# Patient Record
Sex: Male | Born: 1975
Health system: Southern US, Community
[De-identification: ages and names within clinical notes are randomized; demographics above are authoritative.]

## PROBLEM LIST (undated history)

## (undated) DIAGNOSIS — J189 Pneumonia, unspecified organism: Secondary | ICD-10-CM

## (undated) DIAGNOSIS — G4730 Sleep apnea, unspecified: Secondary | ICD-10-CM

## (undated) DIAGNOSIS — S83249A Other tear of medial meniscus, current injury, unspecified knee, initial encounter: Secondary | ICD-10-CM

## (undated) DIAGNOSIS — K219 Gastro-esophageal reflux disease without esophagitis: Secondary | ICD-10-CM

## (undated) DIAGNOSIS — N289 Disorder of kidney and ureter, unspecified: Secondary | ICD-10-CM

## (undated) DIAGNOSIS — H8110 Benign paroxysmal vertigo, unspecified ear: Secondary | ICD-10-CM

## (undated) DIAGNOSIS — M1990 Unspecified osteoarthritis, unspecified site: Secondary | ICD-10-CM

## (undated) DIAGNOSIS — J45909 Unspecified asthma, uncomplicated: Secondary | ICD-10-CM

## (undated) DIAGNOSIS — E669 Obesity, unspecified: Secondary | ICD-10-CM

## (undated) DIAGNOSIS — E7800 Pure hypercholesterolemia, unspecified: Secondary | ICD-10-CM

## (undated) DIAGNOSIS — E119 Type 2 diabetes mellitus without complications: Secondary | ICD-10-CM

## (undated) HISTORY — PX: ADRENALECTOMY: SHX876

## (undated) HISTORY — DX: Unspecified asthma, uncomplicated: J45.909

## (undated) HISTORY — DX: Type 2 diabetes mellitus without complications: E11.9

## (undated) HISTORY — DX: Sleep apnea, unspecified: G47.30

## (undated) HISTORY — DX: Gastro-esophageal reflux disease without esophagitis: K21.9

## (undated) HISTORY — DX: Unspecified osteoarthritis, unspecified site: M19.90

---

## 1997-10-27 ENCOUNTER — Emergency Department (HOSPITAL_COMMUNITY): Admission: EM | Admit: 1997-10-27 | Discharge: 1997-10-28 | Payer: Self-pay | Admitting: Emergency Medicine

## 1997-10-28 ENCOUNTER — Encounter: Payer: Self-pay | Admitting: Emergency Medicine

## 1998-04-28 ENCOUNTER — Emergency Department (HOSPITAL_COMMUNITY): Admission: EM | Admit: 1998-04-28 | Discharge: 1998-04-29 | Payer: Self-pay | Admitting: Emergency Medicine

## 1998-08-09 ENCOUNTER — Emergency Department (HOSPITAL_COMMUNITY): Admission: EM | Admit: 1998-08-09 | Discharge: 1998-08-09 | Payer: Self-pay | Admitting: Emergency Medicine

## 1998-08-11 ENCOUNTER — Emergency Department (HOSPITAL_COMMUNITY): Admission: EM | Admit: 1998-08-11 | Discharge: 1998-08-11 | Payer: Self-pay | Admitting: Emergency Medicine

## 1998-11-22 ENCOUNTER — Emergency Department (HOSPITAL_COMMUNITY): Admission: EM | Admit: 1998-11-22 | Discharge: 1998-11-22 | Payer: Self-pay | Admitting: Emergency Medicine

## 1999-01-09 ENCOUNTER — Emergency Department (HOSPITAL_COMMUNITY): Admission: EM | Admit: 1999-01-09 | Discharge: 1999-01-09 | Payer: Self-pay | Admitting: Emergency Medicine

## 1999-02-20 ENCOUNTER — Emergency Department (HOSPITAL_COMMUNITY): Admission: EM | Admit: 1999-02-20 | Discharge: 1999-02-20 | Payer: Self-pay | Admitting: Emergency Medicine

## 1999-11-28 ENCOUNTER — Inpatient Hospital Stay (HOSPITAL_COMMUNITY): Admission: EM | Admit: 1999-11-28 | Discharge: 1999-12-01 | Payer: Self-pay | Admitting: Emergency Medicine

## 1999-11-28 ENCOUNTER — Encounter: Payer: Self-pay | Admitting: Emergency Medicine

## 2000-04-13 ENCOUNTER — Emergency Department (HOSPITAL_COMMUNITY): Admission: EM | Admit: 2000-04-13 | Discharge: 2000-04-13 | Payer: Self-pay | Admitting: Emergency Medicine

## 2000-05-04 ENCOUNTER — Encounter: Admission: RE | Admit: 2000-05-04 | Discharge: 2000-05-04 | Payer: Self-pay | Admitting: Family Medicine

## 2000-05-09 ENCOUNTER — Ambulatory Visit (HOSPITAL_BASED_OUTPATIENT_CLINIC_OR_DEPARTMENT_OTHER): Admission: RE | Admit: 2000-05-09 | Discharge: 2000-05-09 | Payer: Self-pay | Admitting: *Deleted

## 2000-05-30 ENCOUNTER — Emergency Department (HOSPITAL_COMMUNITY): Admission: EM | Admit: 2000-05-30 | Discharge: 2000-05-30 | Payer: Self-pay | Admitting: Emergency Medicine

## 2000-06-17 ENCOUNTER — Encounter: Payer: Self-pay | Admitting: Emergency Medicine

## 2000-06-17 ENCOUNTER — Emergency Department (HOSPITAL_COMMUNITY): Admission: EM | Admit: 2000-06-17 | Discharge: 2000-06-17 | Payer: Self-pay | Admitting: Emergency Medicine

## 2000-08-27 ENCOUNTER — Encounter: Payer: Self-pay | Admitting: Emergency Medicine

## 2000-08-27 ENCOUNTER — Emergency Department (HOSPITAL_COMMUNITY): Admission: EM | Admit: 2000-08-27 | Discharge: 2000-08-27 | Payer: Self-pay | Admitting: Emergency Medicine

## 2002-06-13 ENCOUNTER — Emergency Department (HOSPITAL_COMMUNITY): Admission: EM | Admit: 2002-06-13 | Discharge: 2002-06-13 | Payer: Self-pay | Admitting: Emergency Medicine

## 2002-08-30 ENCOUNTER — Encounter: Admission: RE | Admit: 2002-08-30 | Discharge: 2002-08-30 | Payer: Self-pay | Admitting: Family Medicine

## 2002-08-30 ENCOUNTER — Encounter: Payer: Self-pay | Admitting: Sports Medicine

## 2002-08-30 ENCOUNTER — Encounter: Admission: RE | Admit: 2002-08-30 | Discharge: 2002-08-30 | Payer: Self-pay | Admitting: Sports Medicine

## 2002-09-05 ENCOUNTER — Encounter: Admission: RE | Admit: 2002-09-05 | Discharge: 2002-09-05 | Payer: Self-pay | Admitting: Family Medicine

## 2002-09-20 ENCOUNTER — Encounter: Admission: RE | Admit: 2002-09-20 | Discharge: 2002-09-20 | Payer: Self-pay | Admitting: Family Medicine

## 2003-01-07 ENCOUNTER — Emergency Department (HOSPITAL_COMMUNITY): Admission: AD | Admit: 2003-01-07 | Discharge: 2003-01-07 | Payer: Self-pay | Admitting: Family Medicine

## 2003-01-14 ENCOUNTER — Emergency Department (HOSPITAL_COMMUNITY): Admission: EM | Admit: 2003-01-14 | Discharge: 2003-01-14 | Payer: Self-pay | Admitting: Emergency Medicine

## 2003-03-18 ENCOUNTER — Emergency Department (HOSPITAL_COMMUNITY): Admission: EM | Admit: 2003-03-18 | Discharge: 2003-03-18 | Payer: Self-pay

## 2003-06-04 ENCOUNTER — Emergency Department (HOSPITAL_COMMUNITY): Admission: EM | Admit: 2003-06-04 | Discharge: 2003-06-04 | Payer: Self-pay | Admitting: Family Medicine

## 2003-07-13 ENCOUNTER — Emergency Department (HOSPITAL_COMMUNITY): Admission: EM | Admit: 2003-07-13 | Discharge: 2003-07-13 | Payer: Self-pay | Admitting: Family Medicine

## 2003-08-13 ENCOUNTER — Emergency Department (HOSPITAL_COMMUNITY): Admission: EM | Admit: 2003-08-13 | Discharge: 2003-08-13 | Payer: Self-pay | Admitting: Family Medicine

## 2003-10-07 ENCOUNTER — Emergency Department (HOSPITAL_COMMUNITY): Admission: EM | Admit: 2003-10-07 | Discharge: 2003-10-07 | Payer: Self-pay | Admitting: Family Medicine

## 2004-01-07 ENCOUNTER — Emergency Department (HOSPITAL_COMMUNITY): Admission: EM | Admit: 2004-01-07 | Discharge: 2004-01-07 | Payer: Self-pay | Admitting: Family Medicine

## 2004-05-17 ENCOUNTER — Emergency Department (HOSPITAL_COMMUNITY): Admission: EM | Admit: 2004-05-17 | Discharge: 2004-05-17 | Payer: Self-pay | Admitting: Emergency Medicine

## 2004-11-27 ENCOUNTER — Emergency Department (HOSPITAL_COMMUNITY): Admission: EM | Admit: 2004-11-27 | Discharge: 2004-11-27 | Payer: Self-pay | Admitting: Emergency Medicine

## 2005-04-02 ENCOUNTER — Emergency Department (HOSPITAL_COMMUNITY): Admission: EM | Admit: 2005-04-02 | Discharge: 2005-04-02 | Payer: Self-pay | Admitting: Family Medicine

## 2005-05-05 ENCOUNTER — Emergency Department (HOSPITAL_COMMUNITY): Admission: EM | Admit: 2005-05-05 | Discharge: 2005-05-05 | Payer: Self-pay | Admitting: Family Medicine

## 2006-03-16 DIAGNOSIS — M25579 Pain in unspecified ankle and joints of unspecified foot: Secondary | ICD-10-CM

## 2006-03-16 DIAGNOSIS — E669 Obesity, unspecified: Secondary | ICD-10-CM | POA: Insufficient documentation

## 2006-09-05 ENCOUNTER — Emergency Department (HOSPITAL_COMMUNITY): Admission: EM | Admit: 2006-09-05 | Discharge: 2006-09-05 | Payer: Self-pay | Admitting: Emergency Medicine

## 2006-09-14 ENCOUNTER — Emergency Department (HOSPITAL_COMMUNITY): Admission: EM | Admit: 2006-09-14 | Discharge: 2006-09-14 | Payer: Self-pay | Admitting: Emergency Medicine

## 2007-11-05 ENCOUNTER — Emergency Department (HOSPITAL_COMMUNITY): Admission: EM | Admit: 2007-11-05 | Discharge: 2007-11-05 | Payer: Self-pay | Admitting: Emergency Medicine

## 2008-06-25 ENCOUNTER — Emergency Department (HOSPITAL_BASED_OUTPATIENT_CLINIC_OR_DEPARTMENT_OTHER): Admission: EM | Admit: 2008-06-25 | Discharge: 2008-06-25 | Payer: Self-pay | Admitting: Emergency Medicine

## 2008-06-25 ENCOUNTER — Ambulatory Visit: Payer: Self-pay | Admitting: Diagnostic Radiology

## 2009-01-29 ENCOUNTER — Emergency Department (HOSPITAL_COMMUNITY): Admission: EM | Admit: 2009-01-29 | Discharge: 2009-01-29 | Payer: Self-pay | Admitting: Emergency Medicine

## 2009-05-04 ENCOUNTER — Emergency Department (HOSPITAL_COMMUNITY): Admission: EM | Admit: 2009-05-04 | Discharge: 2009-05-04 | Payer: Self-pay | Admitting: Emergency Medicine

## 2009-07-31 ENCOUNTER — Telehealth: Payer: Self-pay | Admitting: Pulmonary Disease

## 2009-07-31 ENCOUNTER — Ambulatory Visit: Payer: Self-pay | Admitting: Pulmonary Disease

## 2009-07-31 DIAGNOSIS — G4730 Sleep apnea, unspecified: Secondary | ICD-10-CM

## 2009-07-31 DIAGNOSIS — G4733 Obstructive sleep apnea (adult) (pediatric): Secondary | ICD-10-CM | POA: Insufficient documentation

## 2009-08-04 ENCOUNTER — Encounter: Payer: Self-pay | Admitting: Pulmonary Disease

## 2009-08-05 ENCOUNTER — Ambulatory Visit: Payer: Self-pay | Admitting: Pulmonary Disease

## 2009-08-05 ENCOUNTER — Telehealth: Payer: Self-pay | Admitting: Pulmonary Disease

## 2009-09-01 ENCOUNTER — Telehealth: Payer: Self-pay | Admitting: Pulmonary Disease

## 2009-10-08 ENCOUNTER — Telehealth: Payer: Self-pay | Admitting: Pulmonary Disease

## 2009-10-13 ENCOUNTER — Emergency Department (HOSPITAL_COMMUNITY): Admission: EM | Admit: 2009-10-13 | Discharge: 2009-10-13 | Payer: Self-pay | Admitting: Family Medicine

## 2009-12-17 HISTORY — PX: CORNEAL TRANSPLANT: SHX108

## 2010-02-18 NOTE — Assessment & Plan Note (Signed)
Summary: sleep eval/ mbw    Visit Type:  Initial Consult Copy to:  self Primary Provider/Referring Provider:  n/a  CC:  Pt here for sleep consult.  History of Present Illness: 35/M referred for evaluation of obstructive sleep apnea after doT physical. Epworth Sleepiness Score is 1/24 - he denies excessive daytime somnolence . 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 Bedtime is around MN, minimal sleep latency, denies nocturia, sleeps on his side, no pillows, wakes up around 7A feeling refreshed, denies dryness of mouth or headaches. He has gained 15 lbs inthe lsat 2 yrs. Wife has noted loud snoring but not witnessed apneas. There is no history suggestive of cataplexy, sleep paralysis or parasomnias   Preventive Screening-Counseling & Management  Alcohol-Tobacco     Smoking Status: never   History of Present Illness: None. Employer said I need to have Dr. Janene Madeira I do not have sleep apnea  What time do you typically go to bed?(between what hours): 12:00 am  How long does it take you to fall asleep?  How many times during the night do you wake up? none  What time do you get out of bed to start your day?  6:00-7:00am  Do you drive or operate heavy machinery in your occupation? yes  How much has your weight changed (up or down) over the past two years? (in pounds): -15 lbs  Have you ever had a sleep study before?  If yes,when and where: no  Do you currently use CPAP ? If so , at what pressure? no  Do you wear oxygen at any time? If yes, how many liters per minute? no Current Medications (verified): 1)  Multivitamins   Tabs (Multiple Vitamin) .... Take 1 Tablet By Mouth Once A Day  Allergies (verified): No Known Drug Allergies  Past History:  Past Surgical History: none  Family History: Family History Prostate Cancer-father, paternal uncle Unknown Cancer-paternal aunts and uncles  Social History: No ETOH, no Smoking, no  illicit drugs. Marital Status: Married Children: yes Occupation: Geographical information systems officer, [employer] Patient never smoked.  Smoking Status:  never  Review of Systems  The patient denies shortness of breath with activity, shortness of breath at rest, productive cough, non-productive cough, coughing up blood, chest pain, irregular heartbeats, acid heartburn, indigestion, loss of appetite, weight change, abdominal pain, difficulty swallowing, sore throat, tooth/dental problems, headaches, nasal congestion/difficulty breathing through nose, sneezing, itching, ear ache, anxiety, depression, hand/feet swelling, joint stiffness or pain, rash, change in color of mucus, and fever.    Vital Signs:  Patient profile:   35 year old male Height:      68 inches Weight:      281.12 pounds BMI:     42.90 O2 Sat:      95 % on Room air Temp:     98.3 degrees F oral Pulse rate:   95 / minute BP sitting:   110 / 60  (left arm) Cuff size:   large  Vitals Entered By: Zackery Barefoot CMA (July 31, 2009 10:14 AM)  O2 Flow:  Room air CC: Pt here for sleep consult Comments Medications reviewed with patient Verified contact number and pharmacy with patient Zackery Barefoot CMA  July 31, 2009 10:15 AM    Physical Exam  Additional Exam:  Gen. Pleasant, well-nourished, in no distress, normal affect ENT - no lesions, no post nasal drip, class 3 airway Neck: No JVD, no  thyromegaly, no carotid bruits Lungs: no use of accessory muscles, no dullness to percussion, clear without rales or rhonchi  Cardiovascular: Rhythm regular, heart sounds  normal, no murmurs or gallops, no peripheral edema Abdomen: soft and non-tender, no hepatosplenomegaly, BS normal. Musculoskeletal: No deformities, no cyanosis or clubbing Neuro:  alert, non focal     Impression & Recommendations:  Problem # 1:  OBSTRUCTIVE SLEEP APNEA (ICD-780.57) He does not seem to have excessive daytime somnolence or witnessed apneas. Due to  snoring & narrow pharyngeal exam, a home study will be scheduled. If mild events, he can be cleared to drive Weight loss is recommended. Iwould advocate to extend his card x 90 days if evaluation takes longer - he tells me he has only 4 days left Orders: Sleep Disorder Referral (Sleep Disorder) Consultation Level III (16109)  Medications Added to Medication List This Visit: 1)  Multivitamins Tabs (Multiple vitamin) .... Take 1 tablet by mouth once a day  Patient Instructions: 1)  Please schedule a follow-up appointment in 1 month. 2)  Home sleep study 3)  Get me the name of your doctor fro doT physical & we will send him a note so that your license can be extended 4)  Copy sent to:Dr Korang [phone], [facility]

## 2010-02-18 NOTE — Progress Notes (Signed)
Summary: FYI  Phone Note Call from Patient Call back at [facility] Phone [phone]   Caller: Patient Call For: Amandeep Hogston Summary of Call: Pt states that RA needed name of the doctor that did his physical so he could send note to have his license extended, the info is as follows: [facility], [name] and the fax is [phone].  Initial call taken by: Darletta Moll,  July 31, 2009 2:27 PM  Follow-up for Phone Call        will forward this message to RA as an FYI.  Aundra Millet Reynolds LPN  July 31, 2009 2:31 PM   pl fax note to this address Follow-up by: [name]. Vassie Loll MD,  July 31, 2009 5:12 PM

## 2010-02-18 NOTE — Progress Notes (Signed)
Summary: nos appt  Phone Note Call from Patient   Caller: [email or web address]  Call For: Amaar Oshita Summary of Call: LMTCB x2 to rsc nos from 9/21. Initial call taken by: Darletta Moll,  October 08, 2009 4:22 PM

## 2010-02-18 NOTE — Progress Notes (Signed)
Summary: FYI  Phone Note From Other Clinic Call back at [phone]   Caller: [facility] Call For: Derrick Scott Summary of Call: Pt was seen in their office for a Drug Screen, not a CPX. Initial call taken by: Derrick Scott,  August 05, 2009 1:09 PM  Follow-up for Phone Call        This is an FYI for RA; refer to 07-31-09 phone note for more information.[name] CMA  August 05, 2009 1:29 PM   Additional Follow-up for Phone Call Additional follow up Details #1::        Let him know that sleep study showed mild obstructive sleep apnea & drop in oxygen level, does not need tretament since he is asymptomatic. Weight loss would be curative, weight gain would make this worse. Get the name of his doctor so I can send them a letter - see above (not the drug testing place) Additional Follow-up by: [name]. Derrick Loll MD,  August 05, 2009 3:42 PM    Additional Follow-up for Phone Call Additional follow up Details #2::    called and spoke with pt--he is aware of sleep study results.  he has appt with RA on 8-15,   and his [facility] is [name] at [facility] per pt. [name] CMA  August 05, 2009 3:54 PM   Letter done  - pl send  Follow-up by: [name]. Derrick Loll MD,  August 05, 2009 5:43 PM   Appended Document: Lorain Childes Called pt to get [name] contact information. Pt states he will return call with information. jwr  Appended Document: FYI Pt NOS appointment 08/31/2009 will sign off this append pending pt return call. jwr

## 2010-02-18 NOTE — Progress Notes (Signed)
Summary: nos appt  Phone Note Call from Patient   Caller: [email or web address]  Call For: alva Summary of Call: Rsc nos from 8/15 to 9/21 @ 9a. Initial call taken by: Darletta Moll,  September 01, 2009 10:08 AM

## 2010-02-18 NOTE — Assessment & Plan Note (Signed)
Summary: apena link only/cb   Allergies: No Known Drug Allergies   Other Orders: Sleep Std Airflow/Heartrate and O2 SAT unattended (09811)

## 2010-02-18 NOTE — Letter (Signed)
Summary: Consultant Letter  All     ,     Phone:   Fax:     08/05/2009  Dear Dr. :  I am writing to report my evaluation of Derrick Scott, a 35 Years Old Male on whom I consulted on 08/05/2009 for a sleep evaluation. He denies excessive daytime somnolence. Portable sleep study showed mild obstructive sleep apnea with AHI of 9/hr & a desaturation index of 12/hr. In the absence of symptoms, weight loss is advised.   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 We have also discussed that sleepiness can vary on a daily basis due to various factors & he is responsible for his ability to drive at any given time. Should his weight increase or his symptoms worsen, I woul dbe happt to assess him again.  I have attached a copy of my clinic note to the end of this letter which details the review of systems and physical examination for your review.   Thank you for the opportunity to participate in the care of Derrick Scott.  Yours truly,     [name]. Vassie Loll MD

## 2010-04-01 LAB — POCT RAPID STREP A (OFFICE): Streptococcus, Group A Screen (Direct): NEGATIVE

## 2010-04-06 LAB — URINE MICROSCOPIC-ADD ON

## 2010-04-06 LAB — URINALYSIS, ROUTINE W REFLEX MICROSCOPIC
Ketones, ur: NEGATIVE mg/dL
Leukocytes, UA: NEGATIVE
Nitrite: NEGATIVE
Protein, ur: NEGATIVE mg/dL
Urobilinogen, UA: 0.2 mg/dL (ref 0.0–1.0)

## 2010-06-04 NOTE — Discharge Summary (Signed)
[facility]  Patient:    Derrick Scott, Derrick Scott                   MRN: [id number] Adm. Date:  [id number] Disc. Date: [id number] Attending:  Kae Heller                           Discharge Summary  DISCHARGE DIAGNOSES: 1. Left upper lobe pneumonia. 2. Hypoxia secondary to pneumonia. 3. Hypokalemia.  COURSE OF HOSPITALIZATION:  Derrick Scott was admitted with the diagnosis of community acquired pneumonia.  He was placed on azithromycin as well as Rocephin at the time of admission.  He also was having some fairly significant chest pain related to his pneumonia.  He was given ibuprofen initially 400 mg every six hours as needed but that was changed to Toradol which gave him very significant improvement in his pain.  The patient improved rapidly during hospitalization with regard to the pneumonia.  Within 24 hours of admission, he was breathing significantly better, his pulse oximetry improved to 100% with oxygen supplementation.  We continued him on oxygen because of tachycardia for the next 24 hours and he continued to improve.  He was also given potassium supplementation with his IV fluids to correct his hypokalemia.  By November 13, his blood cultures were negative, we continued him on Rocephin and azithromycin.  His white count was significantly improving.  His tachycardia had markedly improved and his O2 saturations were 98% on room air.  By November 14, he was markedly improved.  He had really no significant symptoms, no dyspnea.  His lungs at that time were clear and there were no abnormalities of his left basilar tubular breath sounds.  DISCHARGE MEDICATIONS: 1. Zithromax 250 mg to complete a 7 day course. 2. Cedax 400 mg 1 p.o. q day for 7 days. 3. Vicodin 1-2 pills q.4-6h. as needed for pain.  DISCHARGE CONDITION:  The patient was discharged in good condition.  DISCHARGE FOLLOW-UP:  He is to followup with Dr. Merril Abbe one week  after discharge.  Patient was to call to schedule that appointment. DD:  01/26/00 TD:  01/27/00 Job: [id number]

## 2010-09-06 ENCOUNTER — Emergency Department (HOSPITAL_COMMUNITY): Payer: BC Managed Care – HMO

## 2010-09-06 ENCOUNTER — Emergency Department (HOSPITAL_COMMUNITY)
Admission: EM | Admit: 2010-09-06 | Discharge: 2010-09-06 | Disposition: A | Discharge status: Home / Self Care | Payer: BC Managed Care – HMO | Attending: Emergency Medicine | Admitting: Emergency Medicine

## 2010-09-06 DIAGNOSIS — R079 Chest pain, unspecified: Secondary | ICD-10-CM | POA: Insufficient documentation

## 2010-09-06 DIAGNOSIS — R0602 Shortness of breath: Secondary | ICD-10-CM | POA: Insufficient documentation

## 2010-09-06 LAB — POCT I-STAT, CHEM 8
Chloride: 105 mEq/L (ref 96–112)
Glucose, Bld: 104 mg/dL — ABNORMAL HIGH (ref 70–99)
HCT: 47 % (ref 39.0–52.0)
Potassium: 4 mEq/L (ref 3.5–5.1)

## 2010-09-06 LAB — CBC
Hemoglobin: 14.8 g/dL (ref 13.0–17.0)
MCH: 29.2 pg (ref 26.0–34.0)
MCHC: 34.3 g/dL (ref 30.0–36.0)
MCV: 85.2 fL (ref 78.0–100.0)
WBC: 5.8 10*3/uL (ref 4.0–10.5)

## 2010-09-06 LAB — BASIC METABOLIC PANEL
BUN: 6 mg/dL (ref 6–23)
Calcium: 9.4 mg/dL (ref 8.4–10.5)
Creatinine, Ser: 0.97 mg/dL (ref 0.50–1.35)
GFR calc non Af Amer: >60 mL/min (ref >60)
Glucose, Bld: 103 mg/dL — ABNORMAL HIGH (ref 70–99)
Potassium: 3.9 mEq/L (ref 3.5–5.1)

## 2010-10-03 ENCOUNTER — Emergency Department (HOSPITAL_COMMUNITY): Payer: BC Managed Care – HMO

## 2010-10-03 ENCOUNTER — Emergency Department (HOSPITAL_COMMUNITY)
Admission: EM | Admit: 2010-10-03 | Discharge: 2010-10-03 | Disposition: A | Discharge status: Home / Self Care | Payer: BC Managed Care – HMO | Attending: Emergency Medicine | Admitting: Emergency Medicine

## 2010-10-03 DIAGNOSIS — J40 Bronchitis, not specified as acute or chronic: Secondary | ICD-10-CM | POA: Insufficient documentation

## 2010-10-03 DIAGNOSIS — R0602 Shortness of breath: Secondary | ICD-10-CM | POA: Insufficient documentation

## 2010-10-03 DIAGNOSIS — R05 Cough: Secondary | ICD-10-CM | POA: Insufficient documentation

## 2010-10-03 DIAGNOSIS — R059 Cough, unspecified: Secondary | ICD-10-CM | POA: Insufficient documentation

## 2011-02-09 ENCOUNTER — Ambulatory Visit: Payer: BC Managed Care – HMO | Admitting: Family Medicine

## 2011-03-02 ENCOUNTER — Ambulatory Visit: Payer: BC Managed Care – HMO | Admitting: Family Medicine

## 2011-04-06 ENCOUNTER — Ambulatory Visit: Payer: BC Managed Care – HMO | Admitting: Family Medicine

## 2011-06-24 ENCOUNTER — Emergency Department (INDEPENDENT_AMBULATORY_CARE_PROVIDER_SITE_OTHER)
Admission: EM | Admit: 2011-06-24 | Discharge: 2011-06-24 | Disposition: A | Discharge status: Home / Self Care | Payer: BC Managed Care – HMO | Source: Home / Self Care

## 2011-06-24 ENCOUNTER — Encounter (HOSPITAL_COMMUNITY): Payer: Self-pay | Admitting: Emergency Medicine

## 2011-06-24 ENCOUNTER — Emergency Department (INDEPENDENT_AMBULATORY_CARE_PROVIDER_SITE_OTHER): Payer: BC Managed Care – HMO

## 2011-06-24 DIAGNOSIS — IMO0002 Reserved for concepts with insufficient information to code with codable children: Secondary | ICD-10-CM

## 2011-06-24 MED ORDER — HYDROCODONE-ACETAMINOPHEN 5-500 MG PO TABS: 1.0000 | ORAL_TABLET | Freq: Four times a day (QID) | ORAL | Status: AC | PRN

## 2011-06-24 NOTE — Discharge Instructions (Signed)
Groin Strain (Adductor Strain)  A groin pull/strain refers to strained muscles on the upper inner part of the thigh. This usually occurs in muscles during exercise or participation in sports events. It is more likely to occur when your muscles are not warmed up well enough or if you are not properly conditioned. A pulled muscle, or muscle strain, occurs when a muscle is over stretched and some muscle fibers are torn. This is especially common in athletes where a sudden violent force placed on a muscle pushes it past its ability to respond. Usually, recovery from a pulled muscle takes 1 to 2 weeks, but complete healing will take 5 to 6 weeks.   HOME CARE INSTRUCTIONS    Apply ice to the sore muscle for 15 to 20 minutes every 2-3 hours for the first 2 days. Put ice in a plastic bag and place a towel between the bag of ice and your skin.   Do not strain the pulled muscle for as long as it is still painful.   Only take over-the-counter or prescription medicines for pain, discomfort, or fever as directed by your caregiver. Do not use aspirin as this may increase bleeding (bruising) at injury site.   Warming up before exercise and developing proper conditioning helps prevent muscle strains.  SEEK IMMEDIATE MEDICAL CARE IF:    There is increased pain or swelling in the affected area.   You are not improving or are getting worse.  Document Released: 09/01/2003 Document Revised: 12/23/2010 Document Reviewed: 08/26/2008  ExitCare Patient Information 2012 [employer].

## 2011-06-24 NOTE — ED Provider Notes (Signed)
History     CSN: [id number]  Arrival date & time 06/24/11  1510   First MD Initiated Contact with Patient 06/24/11 1517      No chief complaint on file.   (Consider location/radiation/quality/duration/timing/severity/associated sxs/prior treatment) HPI  Patient is 36 year old male who presents to urgent care with main concern of sudden onset left hip pain, he first noticed the episode about 2-3 days prior to coming to urgent care and this has been getting worse. Patient denies similar episodes in the past. Patient also denies any specific alleviating factors but does report that movement of any type of activity makes the pain in the left hip worse. Patient denies fevers and chills, he denies use of any over-the-counter medication. She denies other systemic symptoms, no recent trauma, no specific neurologic focal weakness. Patient denies numbness or tingling in that area, no muscle spasms.  No past medical history on file.  No past surgical history on file.  No family history on file.  History  Substance Use Topics  . Smoking status: Not on file  . Smokeless tobacco: Not on file  . Alcohol Use: Not on file     Review of Systems  Constitutional: Denies fever, chills, diaphoresis, appetite change and fatigue.  HEENT: Denies photophobia, eye pain, redness, hearing loss, ear pain, congestion, sore throat, rhinorrhea, sneezing, mouth sores, trouble swallowing, neck pain, neck stiffness and tinnitus.   Respiratory: Denies SOB, DOE, cough, chest tightness,  and wheezing.   Cardiovascular: Denies chest pain, palpitations and leg swelling.  Gastrointestinal: Denies nausea, vomiting, abdominal pain, diarrhea, constipation, blood in stool and abdominal distention.  Genitourinary: Denies dysuria, urgency, frequency, hematuria, flank pain and difficulty urinating.  Musculoskeletal: Denies myalgias, back pain, joint swelling, arthralgias and gait problem.  Skin: Denies pallor, rash and wound.   Neurological: Denies dizziness, seizures, syncope, light-headedness, numbness and headaches.  Hematological: Denies adenopathy. Easy bruising, personal or family bleeding history  Psychiatric/Behavioral: Denies suicidal ideation, mood changes, confusion, nervousness, sleep disturbance and agitation    Allergies  Review of patient's allergies indicates not on file.  Home Medications  No current outpatient prescriptions on file.  There were no vitals taken for this visit.  Physical Exam  Constitutional: Vital signs reviewed.  Patient is a well-developed and well-nourished in no acute distress and cooperative with exam. Alert and oriented x3.  Cardiovascular: RRR, S1 normal, S2 normal, no MRG, pulses symmetric and intact bilaterally Pulmonary/Chest: CTAB, no wheezes, rales, or rhonchi Abdominal: Soft. Non-tender, non-distended, bowel sounds are normal, no masses, organomegaly, or guarding present.  GU: no CVA tenderness Musculoskeletal: No joint deformities, erythema, or stiffness, ROM full and no nontender Neurological: A&O x3, Strenght is normal and symmetric bilaterally, cranial nerve II-XII are grossly intact, no focal motor deficit, sensory intact to light touch bilaterally.  patient reports difficulty performing physical exam due to pain in the left hip, no tenderness to palpation noted.  Skin: Warm, dry and intact. No rash, cyanosis, or clubbing.  Psychiatric: Normal mood and affect. speech and behavior is normal. Judgment and thought content normal. Cognition and memory are normal.    ED Course  Procedures (including critical care time)  XRAY of the left hip - no acute bony abnormalities   Left hip pain - Unclear etiology but most likely musculoskeletal and secondary to muscle strain, groin sprain - X-ray not indicated off any acute bony abnormality - We'll provide analgesia for adequate pain control - I have recommended weightbearing as tolerated, applying ice to the  area  as needed  MDM  I have provided pain medication for adequate control, also recommended weightbearing only as tolerated. I have told patient to apply ice to the affected area if needed.       Dorothea Ogle, MD 06/24/11 1721

## 2011-06-24 NOTE — ED Notes (Signed)
PT HERE WITH C/O LEFT HIP PAIN WITH WORSENING X 2 WEEKS.DIFF WITH PRESSURE.TRIED STRETCHING AND ADVIL FOR RELIEF

## 2011-09-25 ENCOUNTER — Emergency Department (INDEPENDENT_AMBULATORY_CARE_PROVIDER_SITE_OTHER)
Admission: EM | Admit: 2011-09-25 | Discharge: 2011-09-25 | Disposition: A | Discharge status: Home / Self Care | Payer: BC Managed Care – HMO | Source: Home / Self Care

## 2011-09-25 ENCOUNTER — Encounter (HOSPITAL_COMMUNITY): Payer: Self-pay | Admitting: Emergency Medicine

## 2011-09-25 DIAGNOSIS — G44209 Tension-type headache, unspecified, not intractable: Secondary | ICD-10-CM

## 2011-09-25 DIAGNOSIS — F430 Acute stress reaction: Secondary | ICD-10-CM

## 2011-09-25 DIAGNOSIS — R4589 Other symptoms and signs involving emotional state: Secondary | ICD-10-CM

## 2011-09-25 LAB — POCT I-STAT, CHEM 8
BUN: 11 mg/dL (ref 6–23)
Potassium: 3.7 mEq/L (ref 3.5–5.1)
Sodium: 142 mEq/L (ref 135–145)

## 2011-09-25 MED ORDER — LORAZEPAM 0.5 MG PO TABS: 0.5000 mg | ORAL_TABLET | Freq: Three times a day (TID) | ORAL | Status: AC

## 2011-09-25 NOTE — ED Notes (Signed)
Pt states that he woke up this a.m with a very bad headache pain has subsided a little through out the day but still having pain along with stiffness of his neck.  Pt states that over the past couple of days at work his employees noticed that he just didn't look right to them but pt says he felt fine no symptoms of illness.

## 2011-09-25 NOTE — ED Notes (Signed)
Patient and provider made aware results are back

## 2011-09-25 NOTE — ED Provider Notes (Addendum)
History     CSN: [id number]  Arrival date & time 09/25/11  1407   None     Chief Complaint  Patient presents with  . Fatigue    weakness and feeling jittery    (Consider location/radiation/quality/duration/timing/severity/associated sxs/prior treatment) HPI Comments: Awoke this AM feeling weak and jittery, hands shaking and feeling nervous. Past 3 days with post neck and occipital headache without associated neurologic sx's. Bending head forward pulls muscles that hurt. Denies , chest pain, dyspnea, diaphoresis, bleeding, or GI/GU sx's.   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 Job stressful and not getting enough sleep.    History reviewed. No pertinent past medical history.  Past Surgical History  Procedure Date  . Corneal transplant     History reviewed. No pertinent family history.  History  Substance Use Topics  . Smoking status: Never Smoker   . Smokeless tobacco: Not on file  . Alcohol Use: No      Review of Systems  Constitutional: Positive for activity change and fatigue. Negative for fever, chills, diaphoresis and appetite change.       As noted in HPI  HENT: Positive for neck pain. Negative for ear pain, nosebleeds, congestion, sore throat, facial swelling, rhinorrhea, neck stiffness and sinus pressure.   Eyes: Negative.   Respiratory: Negative.   Cardiovascular: Negative.   Gastrointestinal: Negative.   Genitourinary: Negative.   Musculoskeletal: Positive for myalgias.  Skin: Negative.   Neurological: Positive for tremors, weakness and headaches. Negative for dizziness, light-headedness and numbness.  Psychiatric/Behavioral: Positive for disturbed wake/sleep cycle. The patient is nervous/anxious.     Allergies  Review of patient's allergies indicates no known allergies.  Home Medications   Current Outpatient Rx  Name Route Sig Dispense Refill  . LORAZEPAM 0.5 MG PO TABS Oral Take 1 tablet  (0.5 mg total) by mouth every 8 (eight) hours. 20 tablet 0    BP 115/65  Pulse 86  Temp 98.8 F (37.1 C) (Oral)  Resp 19  SpO2 100%  Physical Exam  Constitutional: He is oriented to person, place, and time. He appears well-developed and well-nourished. No distress.  HENT:  Head: Normocephalic and atraumatic.  Mouth/Throat: Oropharynx is clear and moist.  Eyes: EOM are normal. Pupils are equal, round, and reactive to light.       L eye suture visible from corneal transplant  Neck: Normal range of motion. Neck supple.  Cardiovascular: Normal rate and normal heart sounds.   No murmur heard. Pulmonary/Chest: Breath sounds normal. No respiratory distress. He has no wheezes.  Abdominal: Soft. He exhibits no distension. There is no tenderness. There is no rebound.  Musculoskeletal:       Tenderness in occipital musculature as well as scalene muscle bilaterally. No bony tenderness. Full ROM of C-Spine  Lymphadenopathy:    He has no cervical adenopathy.  Neurological: He is alert and oriented to person, place, and time. No cranial nerve deficit.  Skin: Skin is warm. He is not diaphoretic.  Psychiatric: Judgment and thought content normal.    ED Course  Procedures (including critical care time)   Labs Reviewed  POCT I-STAT, CHEM 8  LAB REPORT - SCANNED   No results found.   1. Anxiety as acute reaction to exceptional stress   2. Tension headache       MDM  Stress reaction Ativan .5mg  q 8h for 3-4 days. Recommend out of work for 1-2 days and  get plenty of sleep. Motrin for headache prn.  Results for orders placed during the hospital encounter of 09/25/11  POCT I-STAT, CHEM 8      Component Value Range   Sodium 142  135 - 145 mEq/L   Potassium 3.7  3.5 - 5.1 mEq/L   Chloride 104  96 - 112 mEq/L   BUN 11  6 - 23 mg/dL   Creatinine, Ser 7.82  0.50 - 1.35 mg/dL   Glucose, Bld 98  70 - 99 mg/dL   Calcium, Ion 9.56  2.13 - 1.23 mmol/L   TCO2 26  0 - 100 mmol/L    Hemoglobin 16.0  13.0 - 17.0 g/dL   HCT 08.6  57.8 - 46.9 %            Hayden Rasmussen, NP 09/25/11 1747  Hayden Rasmussen, NP 09/27/11 0759  Hayden Rasmussen, NP 09/27/11 0802  Hayden Rasmussen, NP 09/27/11 6295

## 2011-09-26 NOTE — ED Provider Notes (Signed)
Medical screening examination/treatment/procedure(s) were performed by non-physician practitioner and as supervising physician I was immediately available for consultation/collaboration.  Luiz Blare MD   Luiz Blare, MD 09/26/11 [phone]

## 2011-09-28 NOTE — ED Provider Notes (Signed)
Medical screening examination/treatment/procedure(s) were performed by non-physician practitioner and as supervising physician I was immediately available for consultation/collaboration.  Luiz Blare MD   Luiz Blare, MD 09/28/11 [phone]

## 2012-01-18 DIAGNOSIS — S83249A Other tear of medial meniscus, current injury, unspecified knee, initial encounter: Secondary | ICD-10-CM

## 2012-01-18 HISTORY — DX: Other tear of medial meniscus, current injury, unspecified knee, initial encounter: S83.249A

## 2012-02-07 ENCOUNTER — Encounter (HOSPITAL_COMMUNITY): Payer: Self-pay

## 2012-02-07 ENCOUNTER — Emergency Department (HOSPITAL_COMMUNITY)
Admission: EM | Admit: 2012-02-07 | Discharge: 2012-02-07 | Disposition: A | Discharge status: Home / Self Care | Payer: BC Managed Care – PPO | Attending: Emergency Medicine | Admitting: Emergency Medicine

## 2012-02-07 DIAGNOSIS — M25561 Pain in right knee: Secondary | ICD-10-CM

## 2012-02-07 DIAGNOSIS — M25569 Pain in unspecified knee: Secondary | ICD-10-CM | POA: Insufficient documentation

## 2012-02-07 DIAGNOSIS — Z79899 Other long term (current) drug therapy: Secondary | ICD-10-CM | POA: Insufficient documentation

## 2012-02-07 DIAGNOSIS — M7989 Other specified soft tissue disorders: Secondary | ICD-10-CM | POA: Insufficient documentation

## 2012-02-07 MED ORDER — IBUPROFEN 800 MG PO TABS
800.0000 mg | ORAL_TABLET | Freq: Once | ORAL | Status: AC
  Administered 2012-02-07: 800 mg via ORAL
  Filled 2012-02-07: qty 1

## 2012-02-07 MED ORDER — METHOCARBAMOL 500 MG PO TABS: 500.0000 mg | ORAL_TABLET | Freq: Two times a day (BID) | ORAL | Status: DC

## 2012-02-07 MED ORDER — NAPROXEN 500 MG PO TABS: 500.0000 mg | ORAL_TABLET | Freq: Two times a day (BID) | ORAL | Status: DC

## 2012-02-07 NOTE — ED Notes (Signed)
Pt verbalizes understaning

## 2012-02-07 NOTE — ED Notes (Signed)
Patient reports that he has had knee pain for years intermittently, but started swelling 4 days ago and is painful to walk. Patient denies any injuries

## 2012-02-07 NOTE — ED Provider Notes (Signed)
History  Scribed for Fayrene Helper, PA-C/ Gwyneth Sprout, MD, the patient was seen in room WTR5/WTR5. This chart was scribed by Candelaria Stagers. The patient's care started at 7:21 PM   CSN: [id number]  Arrival date & time 02/07/12  1738   First MD Initiated Contact with Patient 02/07/12 1915      Chief Complaint  Patient presents with  . Knee Pain     The history is provided by the patient. No language interpreter was used.   Derrick Scott is a 37 y.o. male who presents to the Emergency Department complaining of right knee pain that started several years ago and has become swollen and painful to walk on over the last four days.  Pt denies any new injury or trauma.  Pt reports that yesterday he experienced radiation of pain down his right calf that lasted about 15 minutes.  He denies any problems with the ankle or hip.  He states that it is a sharp pain and often the knee feels unstable.  He has taken ibuprofen with no relief.  He has been working out and doing elliptical on a daily basis but has to decrease activity due to pain.  Denies fever, rash, numbness or weakness. History reviewed. No pertinent past medical history.  Past Surgical History  Procedure Date  . Corneal transplant     No family history on file.  History  Substance Use Topics  . Smoking status: Never Smoker   . Smokeless tobacco: Never Used  . Alcohol Use: No      Review of Systems  Constitutional: Negative for fever and chills.  Gastrointestinal: Negative for nausea and vomiting.  Musculoskeletal: Positive for arthralgias (right knee pain ).  Neurological: Negative for weakness.    Allergies  Review of patient's allergies indicates no known allergies.  Home Medications   Current Outpatient Rx  Name  Route  Sig  Dispense  Refill  . ADULT MULTIVITAMIN W/MINERALS CH   Oral   Take 1 tablet by mouth daily.         Marland Kitchen PROTEIN SUPPLEMENT 80% PO   Oral   Take 1 Package by mouth daily.         Marland Kitchen NUTRITIONAL SUPPLEMENTS PO TABS   Oral   Take 1 tablet by mouth daily.           BP 111/81  Pulse 91  Temp 98.5 F (36.9 C) (Oral)  Resp 18  SpO2 98%  Physical Exam  Nursing note and vitals reviewed. Constitutional: He is oriented to person, place, and time. He appears well-developed and well-nourished. No distress.  HENT:  Head: Normocephalic and atraumatic.  Eyes: EOM are normal.  Neck: Neck supple. No tracheal deviation present.  Cardiovascular: Normal rate.   Pulmonary/Chest: Effort normal. No respiratory distress.  Musculoskeletal: Normal range of motion. He exhibits no edema.       Right hip: Normal.       Right ankle: Normal.       No joint laxity of right knee.  Tenderness to medial aspect of anterior right knee.  No tenderness to inferior patellar tendon.  Negative varus and valgus maneuver.  Knee extension produces pain.  Mildly antalgic gait favoring left side due to pain.  No swelling.       Neurological: He is alert and oriented to person, place, and time.       neurovascularly intact.   Skin: Skin is warm and dry. No rash noted.  Psychiatric: He  has a normal mood and affect. His behavior is normal.    ED Course  Procedures   DIAGNOSTIC STUDIES: Oxygen Saturation is 98% on room air, normal by my interpretation.    COORDINATION OF CARE:  7:27 PM  Discussed with pt that there is no need for imaging due to no recent trauma or injury.  Will give pain medication and knee sleeve for support and refer to orthopedist for further studies.  Advised pt to apply cold compress for the next several days and keep the knee elevated.  Pt understands and agrees.   7:45 PM Ordered: ibuprofen (ADVIL, MOTRIN) tablet 800 mg; Apply knee sleeve Discharged with naproxen (NAPROSYN) 500 MG tablet; methocarbamol (ROBAXIN) 500 MG tablet  Labs Reviewed - No data to display No results found.   No diagnosis found.  1. R knee pain (acute on chronic)  MDM  BP 111/81  Pulse  91  Temp 98.5 F (36.9 C) (Oral)  Resp 18  SpO2 98%    I personally performed the services described in this documentation, which was scribed in my presence. The recorded information has been reviewed and is accurate.        Fayrene Helper, PA-C 02/07/12 1938

## 2012-02-07 NOTE — ED Notes (Signed)
Ortho tech called for application of knee sleeve.  

## 2012-02-07 NOTE — ED Provider Notes (Signed)
Medical screening examination/treatment/procedure(s) were performed by non-physician practitioner and as supervising physician I was immediately available for consultation/collaboration.   Arish Redner, MD 02/07/12 2332 

## 2012-02-15 ENCOUNTER — Ambulatory Visit (INDEPENDENT_AMBULATORY_CARE_PROVIDER_SITE_OTHER): Payer: BC Managed Care – PPO | Admitting: Sports Medicine

## 2012-02-15 VITALS — BP 122/80 | Ht 68.0 in | Wt 250.0 lb

## 2012-02-15 DIAGNOSIS — M25561 Pain in right knee: Secondary | ICD-10-CM | POA: Insufficient documentation

## 2012-02-15 DIAGNOSIS — M25569 Pain in unspecified knee: Secondary | ICD-10-CM

## 2012-02-15 NOTE — Progress Notes (Signed)
HPI: Patient is a 37 y/o male with who presents with complaints of right knee pain.  States he has had for several years intermittently, but over the last week, pain has become constant, sharp, knee is swollen.  At worst pain gets to 8/10.  States pain is located just above and below the patella as well as behind the lateral knee in posterior fossa where he points to pain.  Worse with activity, especially kneeling/squatting.  Better with rest.  Has tried Ibuprofen, which has not helped the pain, but reportedly helped the swelling.  Patient states that he is unable to think of any given inciting injury, but has sustained injury to knee in the past when playing with his son.  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 Climbing ladders or standing is very difficult since this injury  His remote injury appears to have been a partial patellar tendon tear  ROS: Negative except as above.  Physical Exam: General: Patient sitting on exam table in NAD; morbid obesity Right knee: Anterior/Posterior drawer, Lachman's test negative.  Stable to varus and valgus testing.  Neurovascularly intact distally.  +popping noted in medial aspect during McMurray's test.  Patient unable to do Thessaly's test.  No TTP of knee joint.  Limited flexion (110 degrees on right compared with 140 degrees on left).  Patient unable to fully extend right knee.  Possible medial effusion noted (difficult to interpret given body habitus).  A/P: Right knee pain: On ultrasound of right knee, patient noted to have suprapatellar pouch effusion, old patellar tendon tear with calcifications, probable tear of posterior lateral corner of lateral meniscus.  May need further imaging with MRI. Will have patient evaluated by ortho as patient may require surgery.

## 2012-02-15 NOTE — Patient Instructions (Addendum)
You have been scheduled for an appt with Dr. Luiz Blare on 02/16/12 at 2:15pm- please arrive at 1:45pm for paperwork.   Their office is located at [address] [place]  [phone]

## 2012-02-16 ENCOUNTER — Other Ambulatory Visit: Payer: Self-pay | Admitting: Orthopedic Surgery

## 2012-02-17 ENCOUNTER — Encounter (HOSPITAL_BASED_OUTPATIENT_CLINIC_OR_DEPARTMENT_OTHER): Payer: Self-pay | Admitting: *Deleted

## 2012-02-20 ENCOUNTER — Ambulatory Visit (HOSPITAL_BASED_OUTPATIENT_CLINIC_OR_DEPARTMENT_OTHER)
Admission: RE | Admit: 2012-02-20 | Discharge: 2012-02-20 | Disposition: A | Discharge status: Home / Self Care | Payer: BC Managed Care – PPO | Source: Ambulatory Visit | Attending: Orthopedic Surgery | Admitting: Orthopedic Surgery

## 2012-02-20 ENCOUNTER — Encounter (HOSPITAL_BASED_OUTPATIENT_CLINIC_OR_DEPARTMENT_OTHER): Payer: Self-pay | Admitting: *Deleted

## 2012-02-20 ENCOUNTER — Encounter (HOSPITAL_BASED_OUTPATIENT_CLINIC_OR_DEPARTMENT_OTHER): Payer: Self-pay | Admitting: Anesthesiology

## 2012-02-20 ENCOUNTER — Encounter (HOSPITAL_BASED_OUTPATIENT_CLINIC_OR_DEPARTMENT_OTHER)
Admission: RE | Disposition: A | Discharge status: Home / Self Care | Payer: Self-pay | Source: Ambulatory Visit | Attending: Orthopedic Surgery

## 2012-02-20 ENCOUNTER — Ambulatory Visit (HOSPITAL_BASED_OUTPATIENT_CLINIC_OR_DEPARTMENT_OTHER): Payer: BC Managed Care – PPO | Admitting: Anesthesiology

## 2012-02-20 DIAGNOSIS — M2340 Loose body in knee, unspecified knee: Secondary | ICD-10-CM | POA: Insufficient documentation

## 2012-02-20 DIAGNOSIS — M959 Acquired deformity of musculoskeletal system, unspecified: Secondary | ICD-10-CM | POA: Insufficient documentation

## 2012-02-20 DIAGNOSIS — M2240 Chondromalacia patellae, unspecified knee: Secondary | ICD-10-CM | POA: Insufficient documentation

## 2012-02-20 HISTORY — DX: Other tear of medial meniscus, current injury, unspecified knee, initial encounter: S83.249A

## 2012-02-20 HISTORY — PX: KNEE ARTHROSCOPY: SHX127

## 2012-02-20 SURGERY — ARTHROSCOPY, KNEE
Anesthesia: General | Site: Knee | Laterality: Right | Wound class: Clean

## 2012-02-20 MED ORDER — MIDAZOLAM HCL 5 MG/5ML IJ SOLN
INTRAMUSCULAR | Status: DC | PRN
  Administered 2012-02-20: 2 mg via INTRAVENOUS

## 2012-02-20 MED ORDER — ONDANSETRON HCL 4 MG/2ML IJ SOLN: 4.0000 mg | Freq: Once | INTRAMUSCULAR | Status: DC | PRN

## 2012-02-20 MED ORDER — ONDANSETRON HCL 4 MG/2ML IJ SOLN
INTRAMUSCULAR | Status: DC | PRN
  Administered 2012-02-20: 4 mg via INTRAVENOUS

## 2012-02-20 MED ORDER — MIDAZOLAM HCL 2 MG/2ML IJ SOLN: 1.0000 mg | INTRAMUSCULAR | Status: DC | PRN

## 2012-02-20 MED ORDER — LIDOCAINE HCL (CARDIAC) 20 MG/ML IV SOLN
INTRAVENOUS | Status: DC | PRN
  Administered 2012-02-20: 75 mg via INTRAVENOUS

## 2012-02-20 MED ORDER — PROPOFOL 10 MG/ML IV BOLUS
INTRAVENOUS | Status: DC | PRN
  Administered 2012-02-20: 300 mg via INTRAVENOUS

## 2012-02-20 MED ORDER — MIDAZOLAM HCL 2 MG/ML PO SYRP: 12.0000 mg | ORAL_SOLUTION | Freq: Once | ORAL | Status: DC | PRN

## 2012-02-20 MED ORDER — SODIUM CHLORIDE 0.9 % IR SOLN
Status: DC | PRN
  Administered 2012-02-20: 6000 mL

## 2012-02-20 MED ORDER — CEFAZOLIN SODIUM-DEXTROSE 2-3 GM-% IV SOLR
INTRAVENOUS | Status: DC | PRN
  Administered 2012-02-20: 2 g via INTRAVENOUS

## 2012-02-20 MED ORDER — OXYCODONE HCL 5 MG PO TABS: 5.0000 mg | ORAL_TABLET | Freq: Once | ORAL | Status: DC | PRN

## 2012-02-20 MED ORDER — HYDROMORPHONE HCL PF 1 MG/ML IJ SOLN: 0.2500 mg | INTRAMUSCULAR | Status: DC | PRN

## 2012-02-20 MED ORDER — DEXAMETHASONE SODIUM PHOSPHATE 4 MG/ML IJ SOLN
INTRAMUSCULAR | Status: DC | PRN
  Administered 2012-02-20: 10 mg via INTRAVENOUS

## 2012-02-20 MED ORDER — OXYCODONE-ACETAMINOPHEN 5-325 MG PO TABS: 1.0000 | ORAL_TABLET | Freq: Four times a day (QID) | ORAL | Status: DC | PRN

## 2012-02-20 MED ORDER — LACTATED RINGERS IV SOLN
INTRAVENOUS | Status: DC
  Administered 2012-02-20 (×2): via INTRAVENOUS

## 2012-02-20 MED ORDER — FENTANYL CITRATE 0.05 MG/ML IJ SOLN: 50.0000 ug | INTRAMUSCULAR | Status: DC | PRN

## 2012-02-20 MED ORDER — BUPIVACAINE HCL (PF) 0.5 % IJ SOLN
INTRAMUSCULAR | Status: DC | PRN
  Administered 2012-02-20: 20 mL

## 2012-02-20 MED ORDER — FENTANYL CITRATE 0.05 MG/ML IJ SOLN
INTRAMUSCULAR | Status: DC | PRN
  Administered 2012-02-20: 100 ug via INTRAVENOUS
  Administered 2012-02-20: 25 ug via INTRAVENOUS
  Administered 2012-02-20: 50 ug via INTRAVENOUS

## 2012-02-20 MED ORDER — OXYCODONE HCL 5 MG/5ML PO SOLN: 5.0000 mg | Freq: Once | ORAL | Status: DC | PRN

## 2012-02-20 SURGICAL SUPPLY — 37 items
BANDAGE ELASTIC 6 VELCRO ST LF (GAUZE/BANDAGES/DRESSINGS) ×3 IMPLANT
BLADE 4.2CUDA (BLADE) IMPLANT
BLADE GREAT WHITE 4.2 (BLADE) ×2 IMPLANT
CANISTER OMNI JUG 16 LITER (MISCELLANEOUS) ×1 IMPLANT
CANISTER SUCTION 2500CC (MISCELLANEOUS) IMPLANT
CLOTH BEACON ORANGE TIMEOUT ST (SAFETY) ×2 IMPLANT
CUTTER MENISCUS  4.2MM (BLADE)
CUTTER MENISCUS 4.2MM (BLADE) IMPLANT
DRAPE ARTHROSCOPY W/POUCH 114 (DRAPES) ×2 IMPLANT
DRSG EMULSION OIL 3X3 NADH (GAUZE/BANDAGES/DRESSINGS) ×2 IMPLANT
DURAPREP 26ML APPLICATOR (WOUND CARE) ×2 IMPLANT
ELECT MENISCUS 165MM 90D (ELECTRODE) IMPLANT
ELECT REM PT RETURN 9FT ADLT (ELECTROSURGICAL)
ELECTRODE REM PT RTRN 9FT ADLT (ELECTROSURGICAL) IMPLANT
GLOVE BIOGEL PI IND STRL 8 (GLOVE) ×2 IMPLANT
GLOVE BIOGEL PI INDICATOR 8 (GLOVE) ×3
GLOVE ECLIPSE 7.5 STRL STRAW (GLOVE) ×4 IMPLANT
GOWN BRE IMP PREV XXLGXLNG (GOWN DISPOSABLE) ×3 IMPLANT
GOWN PREVENTION PLUS XLARGE (GOWN DISPOSABLE) ×3 IMPLANT
GOWN PREVENTION PLUS XXLARGE (GOWN DISPOSABLE) IMPLANT
HOLDER KNEE FOAM BLUE (MISCELLANEOUS) ×2 IMPLANT
KNEE WRAP E Z 3 GEL PACK (MISCELLANEOUS) ×2 IMPLANT
NDL SAFETY ECLIPSE 18X1.5 (NEEDLE) IMPLANT
NEEDLE HYPO 18GX1.5 SHARP (NEEDLE)
PACK ARTHROSCOPY DSU (CUSTOM PROCEDURE TRAY) ×2 IMPLANT
PACK BASIN DAY SURGERY FS (CUSTOM PROCEDURE TRAY) ×2 IMPLANT
PAD CAST 4YDX4 CTTN HI CHSV (CAST SUPPLIES) ×1 IMPLANT
PADDING CAST COTTON 4X4 STRL (CAST SUPPLIES) ×2
PENCIL BUTTON HOLSTER BLD 10FT (ELECTRODE) IMPLANT
SET ARTHROSCOPY TUBING (MISCELLANEOUS) ×2
SET ARTHROSCOPY TUBING LN (MISCELLANEOUS) ×1 IMPLANT
SPONGE GAUZE 4X4 12PLY (GAUZE/BANDAGES/DRESSINGS) ×2 IMPLANT
SUT ETHILON 4 0 PS 2 18 (SUTURE) IMPLANT
SYR 5ML LL (SYRINGE) ×2 IMPLANT
TOWEL OR 17X24 6PK STRL BLUE (TOWEL DISPOSABLE) ×2 IMPLANT
TOWEL OR NON WOVEN STRL DISP B (DISPOSABLE) ×2 IMPLANT
WATER STERILE IRR 1000ML POUR (IV SOLUTION) ×2 IMPLANT

## 2012-02-20 NOTE — Addendum Note (Signed)
Addendum  created 02/20/12 1440 by Ronnette Hila, CRNA   Modules edited:Anesthesia Blocks and Procedures, Inpatient Notes

## 2012-02-20 NOTE — Discharge Instructions (Signed)
POST-OP KNEE ARTHROSCOPY INSTRUCTIONS  °Dr. John Graves/Jim Bethune PA-C ° °Pain °You will be expected to have a moderate amount of pain in the affected knee for approximately two weeks. However, the first two days will be the most severe pain. A prescription has been provided to take as needed for the pain. The pain can be reduced by applying ice packs to the knee for the first 1-2 weeks post surgery. Also, keeping the leg elevated on pillows will help alleviate the pain. If you develop any acute pain or swelling in your calf muscle, please call the doctor. ° °Activity °It is preferred that you stay at bed rest for approximately 24 hours. However, you may go to the bathroom with help. Weight bearing as tolerated. You may begin the knee exercises the day of surgery. Discontinue crutches as the knee pain resolves. ° °Dressing °Keep the dressing dry. If the ace bandage should wrinkle or roll up, this can be rewrapped to prevent ridges in the bandage. You may remove all dressings in 48 hours,  apply bandaids to each wound. You may shower on the 4th day after surgery but no tub bath. ° °Symptoms to report to your doctor °Extreme pain °Extreme swelling °Temperature above 101 degrees °Change in the feeling, color, or movement of your toes °Redness, heat, or swelling at your incision ° °Exercise °If is preferred that as soon as possible you try to do a straight leg raise without bending the knee and concentrate on bringing the heel of your foot off the bed up to approximately 45 degrees and hold for the count of 10 seconds. Repeat this at least 10 times three or four times per day. Additional exercises are provided below. ° °You are encouraged to bend the knee as tolerated. ° °Follow-Up °Call to schedule a follow-up appointment in 5-7 days. Our office # is [phone]. ° °POST-OP EXERCISES ° °Short Arc Quads ° °1. Lie on back with legs straight. Place towel roll under thigh, just above knee. °2. Tighten thigh muscles to  straighten knee and lift heel off bed. °3. Hold for slow count of five, then lower. °4. Do three sets of ten ° ° ° °Straight Leg Raises ° °1. Lie on back with operative leg straight and other leg bent. °2. Keeping operative leg completely straight, slowly lift operative leg so foot is 5 inches off bed. °3. Hold for slow count of five, then lower. °4. Do three sets of ten. ° ° ° °DO BOTH EXERCISES 2 TIMES A DAY ° °Ankle Pumps ° °Work/move the operative ankle and foot up and down 10 times every hour while awake. ° ° °Post Anesthesia Home Care Instructions ° °Activity: °Get plenty of rest for the remainder of the day. A responsible adult should stay with you for 24 hours following the procedure.  °For the next 24 hours, DO NOT: °-Drive a car °-Operate machinery °-Drink alcoholic beverages °-Take any medication unless instructed by your physician °-Make any legal decisions or sign important papers. ° °Meals: °Start with liquid foods such as gelatin or soup. Progress to regular foods as tolerated. Avoid greasy, spicy, heavy foods. If nausea and/or vomiting occur, drink only clear liquids until the nausea and/or vomiting subsides. Call your physician if vomiting continues. ° °Special Instructions/Symptoms: °Your throat may feel dry or sore from the anesthesia or the breathing tube placed in your throat during surgery. If this causes discomfort, gargle with warm salt water. The discomfort should disappear within 24 hours. ° °

## 2012-02-20 NOTE — Anesthesia Postprocedure Evaluation (Signed)
  Anesthesia Post-op Note  Patient: Derrick Scott  Procedure(s) Performed: Procedure(s) (LRB) with comments: ARTHROSCOPY KNEE (Right) - Chondroplasia patella femoral joint, medial meniscal debriedment  Patient Location: PACU  Anesthesia Type:General  Level of Consciousness: awake, alert  and oriented  Airway and Oxygen Therapy: Patient Spontanous Breathing  Post-op Pain: mild  Post-op Assessment: Post-op Vital signs reviewed  Post-op Vital Signs: Reviewed  Complications: No apparent anesthesia complications

## 2012-02-20 NOTE — H&P (Signed)
  PREOPERATIVE H&P  Chief Complaint: r knee pain  HPI: Derrick Scott is a 37 y.o. male who presents for evaluation of r. Knee pain. It has been present for greater than 1 year and has been worsening. He has failed conservative measures. Pain is rated as moderate.  Past Medical History  Diagnosis Date  . Medial meniscus tear 01/2012    right knee   Past Surgical History  Procedure Date  . Corneal transplant 12/2009   History   Social History  . Marital Status: Married    Spouse Name: N/A    Number of Children: N/A  . Years of Education: N/A   Social History Main Topics  . Smoking status: Never Smoker   . Smokeless tobacco: Never Used  . Alcohol Use: No  . Drug Use: No  . Sexually Active:    Other Topics Concern  . None   Social History Narrative  . None   History reviewed. No pertinent family history. No Known Allergies Prior to Admission medications   Medication Sig Start Date End Date Taking? Authorizing Provider  ibuprofen (ADVIL,MOTRIN) 800 MG tablet Take 800 mg by mouth every 8 (eight) hours as needed.   Yes Historical Provider, MD  Multiple Vitamin (MULTIVITAMIN) tablet Take 1 tablet by mouth daily.   Yes Historical Provider, MD     Positive ROS: none  All other systems have been reviewed and were otherwise negative with the exception of those mentioned in the HPI and as above.  Physical Exam: Filed Vitals:   02/20/12 1026  BP: 128/80  Pulse: 93  Temp: 98.3 F (36.8 C)  Resp: 16    General: Alert, no acute distress Cardiovascular: No pedal edema Respiratory: No cyanosis, no use of accessory musculature GI: No organomegaly, abdomen is soft and non-tender Skin: No lesions in the area of chief complaint Neurologic: Sensation intact distally Psychiatric: Patient is competent for consent with normal mood and affect Lymphatic: No axillary or cervical lymphadenopathy  MUSCULOSKELETAL: r knee: + mcmurray -instability painful med jt line  tender  Assessment/Plan: MEDIAL MENISCUS TEAR RIGHT KNEE Plan for Procedure(s): ARTHROSCOPY KNEE  The risks benefits and alternatives were discussed with the patient including but not limited to the risks of nonoperative treatment, versus surgical intervention including infection, bleeding, nerve injury, malunion, nonunion, hardware prominence, hardware failure, need for hardware removal, blood clots, cardiopulmonary complications, morbidity, mortality, among others, and they were willing to proceed.  Predicted outcome is good, although there will be at least a six to nine month expected recovery.  Harvie Junior, MD 02/20/2012 12:05 PM

## 2012-02-20 NOTE — Brief Op Note (Signed)
02/20/2012  3:09 PM  PATIENT:  Orvan Falconer II  37 y.o. male  PRE-OPERATIVE DIAGNOSIS:  MEDIAL MENISCUS TEAR RIGHT KNEE  POST-OPERATIVE DIAGNOSIS:  MEDIAL MENISCUS TEAR RIGHT KNEE  PROCEDURE:  Procedure(s) (LRB) with comments: ARTHROSCOPY KNEE (Right) - Chondroplasia patella femoral joint, medial meniscal debriedment  SURGEON:  Surgeon(s) and Role:    * Harvie Junior, MD - Primary  PHYSICIAN ASSISTANT:   ASSISTANTS: bethune    ANESTHESIA:   general  EBL:  Total I/O In: 4098 [P.O.:222; I.V.:1450] Out: -   BLOOD ADMINISTERED:none  DRAINS: none   LOCAL MEDICATIONS USED:  MARCAINE     SPECIMEN:  No Specimen  DISPOSITION OF SPECIMEN:  N/A  COUNTS:  YES  TOURNIQUET:  * No tourniquets in log *  DICTATION: .Other Dictation: Dictation Number [id number]  PLAN OF CARE: d/c home after pacu  PATIENT DISPOSITION:  PACU - hemodynamically stable.   Delay start of Pharmacological VTE agent (>24hrs) due to surgical blood loss or risk of bleeding: no

## 2012-02-20 NOTE — Anesthesia Procedure Notes (Signed)
Procedure Name: LMA Insertion Date/Time: 02/20/2012 12:21 PM Performed by: Zenia Resides D Pre-anesthesia Checklist: Patient identified, Emergency Drugs available, Suction available and Patient being monitored Patient Re-evaluated:Patient Re-evaluated prior to inductionOxygen Delivery Method: Circle System Utilized Preoxygenation: Pre-oxygenation with 100% oxygen Intubation Type: IV induction Ventilation: Mask ventilation without difficulty LMA: LMA inserted LMA Size: 5.0 Number of attempts: 1 Airway Equipment and Method: bite block Placement Confirmation: positive ETCO2 Tube secured with: Tape Dental Injury: Teeth and Oropharynx as per pre-operative assessment

## 2012-02-20 NOTE — Anesthesia Preprocedure Evaluation (Addendum)
Anesthesia Evaluation  Patient identified by MRN, date of birth, ID band Patient awake    Reviewed: Allergy & Precautions, H&P , NPO status , Patient's Chart, lab work & pertinent test results  Airway Mallampati: I TM Distance: >3 FB Neck ROM: Full    Dental  (+) Teeth Intact and Dental Advisory Given   Pulmonary  breath sounds clear to auscultation        Cardiovascular Rhythm:Regular Rate:Normal     Neuro/Psych    GI/Hepatic   Endo/Other  Morbid obesity  Renal/GU      Musculoskeletal   Abdominal   Peds  Hematology   Anesthesia Other Findings   Reproductive/Obstetrics                          Anesthesia Physical Anesthesia Plan  ASA: II  Anesthesia Plan: General   Post-op Pain Management:    Induction: Intravenous  Airway Management Planned: LMA  Additional Equipment:   Intra-op Plan:   Post-operative Plan: Extubation in OR  Informed Consent: I have reviewed the patients History and Physical, chart, labs and discussed the procedure including the risks, benefits and alternatives for the proposed anesthesia with the patient or authorized representative who has indicated his/her understanding and acceptance.     Plan Discussed with: CRNA, Anesthesiologist and Surgeon  Anesthesia Plan Comments:         Anesthesia Quick Evaluation

## 2012-02-20 NOTE — Transfer of Care (Signed)
Immediate Anesthesia Transfer of Care Note  Patient: Derrick Scott  Procedure(s) Performed: Procedure(s) (LRB) with comments: ARTHROSCOPY KNEE (Right) - Chondroplasia patella femoral joint, medial meniscal debriedment  Patient Location: PACU  Anesthesia Type:General  Level of Consciousness: awake  Airway & Oxygen Therapy: Patient Spontanous Breathing and Patient connected to face mask oxygen  Post-op Assessment: Report given to PACU RN and Post -op Vital signs reviewed and stable  Post vital signs: Reviewed and stable  Complications: No apparent anesthesia complications

## 2012-02-21 ENCOUNTER — Encounter (HOSPITAL_BASED_OUTPATIENT_CLINIC_OR_DEPARTMENT_OTHER): Payer: Self-pay | Admitting: Orthopedic Surgery

## 2012-02-21 NOTE — Op Note (Signed)
NAMECHELSEA, NUSZ NO.:  [id number]  MEDICAL RECORD NO.:  [id number]  LOCATION:                                 FACILITY:  PHYSICIAN:  Harvie Junior, M.D.   DATE OF BIRTH:  [birth year]  DATE OF PROCEDURE:  02/20/2012 DATE OF DISCHARGE:                              OPERATIVE REPORT   PREOPERATIVE DIAGNOSIS:  Medial and lateral meniscus tear.  POSTOPERATIVE DIAGNOSIS: 1. Medial femoral condyle condylar defect. 2. Chondromalacia of the patellofemoral joint. 3. Osteocartilaginous loose body, right knee.  PROCEDURE: 1. Arthroscopic debridement of medial femoral condyle. 2. Arthroscopic removal of osteocartilaginous loose bodies. 3. Arthroscopic debridement of patellofemoral joint.  SURGEON:  Harvie Junior, M.D.  ASSISTANT:  Marshia Ly, P.A.  ANESTHESIA:  General.  BRIEF HISTORY:  Mr. Sperl is a 37 year old male with a history of having had significant knee pain.  He had been treated conservatively for a significant period of time.  After failure of conservative care, he was evaluated by his medical team, ultrasound and having said he had medial and lateral meniscus tears, physical exam and inability to straighten the leg, and had difficulty flexing the leg, he was evaluated and certainly had exam consistent with a meniscal tear and at that point, I felt that the most appropriate course of action will be operative knee arthroscopy and was brought to the operating room for this procedure.  PROCEDURE:  The patient was brought to the operating room and after adequate anesthesia was obtained with general anesthetic, the patient was placed supine on the operating table.  The right leg was prepped and draped in usual sterile fashion.  Following this, routine arthroscopic examination of the knee revealed there was chondromalacia of the patellofemoral joint, which was grade 2 in nature.  This was debrided back to a smooth and stable rim.  Attention  turned down in the medial compartment.  Medial meniscus looked normal.  Initially, there was a large condylar defect on the medial femoral condyle with large flaps, which were flaking off.  This was debrided with a suction shaver to smooth and stable rim.  The medial meniscus was probed and felt to be normal.  Attention was turned to the ACL, which was normal.  There was a small anterior synovium, which was removed.  Attention was turned to the lateral compartment where the lateral meniscus were probed at length and felt to be normal.  Lateral femoral condyle had no significant condylar defect.  At this point, the knee was copiously and thoroughly lavaged and suctioned dry.  Arthroscopic portals were closed with bandage.  Sterile compressive dressing was applied.  The patient was taken to the recovery room and was noted to be in a satisfactory condition.  Estimated blood loss for the procedure was none.     Harvie Junior, M.D.     Ranae Plumber  D:  02/20/2012  T:  02/21/2012  Job:  [id number]

## 2012-09-26 ENCOUNTER — Emergency Department (HOSPITAL_COMMUNITY)
Admission: EM | Admit: 2012-09-26 | Discharge: 2012-09-26 | Disposition: A | Discharge status: Home / Self Care | Payer: BC Managed Care – PPO | Attending: Emergency Medicine | Admitting: Emergency Medicine

## 2012-09-26 ENCOUNTER — Encounter (HOSPITAL_COMMUNITY): Payer: Self-pay | Admitting: Emergency Medicine

## 2012-09-26 DIAGNOSIS — G43909 Migraine, unspecified, not intractable, without status migrainosus: Secondary | ICD-10-CM | POA: Insufficient documentation

## 2012-09-26 DIAGNOSIS — Z87828 Personal history of other (healed) physical injury and trauma: Secondary | ICD-10-CM | POA: Insufficient documentation

## 2012-09-26 MED ORDER — SODIUM CHLORIDE 0.9 % IV BOLUS (SEPSIS)
1000.0000 mL | Freq: Once | INTRAVENOUS | Status: AC
Start: 2012-09-26 — End: 2012-09-26
  Administered 2012-09-26: 1000 mL via INTRAVENOUS

## 2012-09-26 MED ORDER — DIPHENHYDRAMINE HCL 50 MG/ML IJ SOLN
25.0000 mg | Freq: Once | INTRAMUSCULAR | Status: AC
  Administered 2012-09-26: 25 mg via INTRAVENOUS
  Filled 2012-09-26: qty 1

## 2012-09-26 MED ORDER — DEXAMETHASONE SODIUM PHOSPHATE 10 MG/ML IJ SOLN
10.0000 mg | Freq: Once | INTRAMUSCULAR | Status: AC
  Administered 2012-09-26: 10 mg via INTRAVENOUS
  Filled 2012-09-26: qty 1

## 2012-09-26 MED ORDER — METOCLOPRAMIDE HCL 5 MG/ML IJ SOLN
10.0000 mg | Freq: Once | INTRAMUSCULAR | Status: AC
  Administered 2012-09-26: 10 mg via INTRAVENOUS
  Filled 2012-09-26: qty 2

## 2012-09-26 MED ORDER — KETOROLAC TROMETHAMINE 30 MG/ML IJ SOLN
30.0000 mg | Freq: Once | INTRAMUSCULAR | Status: AC
  Administered 2012-09-26: 30 mg via INTRAVENOUS
  Filled 2012-09-26: qty 1

## 2012-09-26 NOTE — ED Notes (Signed)
Pt stated hx of migraines, feels similar to pain experienced now, states took Imitrex with no relief.  Pt reports sensitivity to lights and sounds, denies n/v at this time, denies dizziness.

## 2012-09-26 NOTE — ED Provider Notes (Signed)
Medical screening examination/treatment/procedure(s) were performed by non-physician practitioner and as supervising physician I was immediately available for consultation/collaboration.  Lyanne Co, MD 09/26/12 [phone]

## 2012-09-26 NOTE — ED Provider Notes (Signed)
CSN: [id number]     Arrival date & time 09/26/12  0039 History   First MD Initiated Contact with Patient 09/26/12 0109     Chief Complaint  Patient presents with  . Migraine   HPI  History provided by the patient. Patient is a 37 year old male presented with complaints of persistent migraine type headache. He does report having history of similar headaches but states that his current headache has been lasting much longer period first began to have headache on Friday, 5 days ago. Patient does report going to the [facility] urgent care and received a prescription for Imitrex. This has helped ease the headache pains off for a few days however yesterday evening and early this morning and his symptoms began to increase and worsen. He did not take any additional Imitrex at that time. Symptoms are worsened by bright lights and loud noises. He did report some associated nausea earlier with severe pains. Denies any episodes of vomiting. No recent fevers. No vision changes. No weakness or numbness in extremities. No other aggravating or alleviating factors. No other associated symptoms.    Past Medical History  Diagnosis Date  . Medial meniscus tear 01/2012    right knee   Past Surgical History  Procedure Laterality Date  . Corneal transplant  12/2009  . Knee arthroscopy  02/20/2012    Procedure: ARTHROSCOPY KNEE;  Surgeon: Harvie Junior, MD;  Location: [facility];  Service: Orthopedics;  Laterality: Right;  Chondroplasia patella femoral joint, medial meniscal debriedment   History reviewed. No pertinent family history. History  Substance Use Topics  . Smoking status: Never Smoker   . Smokeless tobacco: Never Used  . Alcohol Use: No    Review of Systems  Constitutional: Negative for fever.  Eyes: Positive for photophobia. Negative for visual disturbance.  Cardiovascular: Negative for chest pain.  Gastrointestinal: Positive for nausea. Negative for vomiting and abdominal pain.   Neurological: Positive for headaches.  All other systems reviewed and are negative.    Allergies  Review of patient's allergies indicates no known allergies.  Home Medications   Current Outpatient Rx  Name  Route  Sig  Dispense  Refill  . SUMAtriptan (IMITREX) 50 MG tablet   Oral   Take 50 mg by mouth every 2 (two) hours as needed for migraine.          BP 120/82  Pulse 85  Temp(Src) 98 F (36.7 C) (Oral)  Resp 16  Ht 5\' 9"  (1.753 m)  Wt 260 lb (117.935 kg)  BMI 38.38 kg/m2  SpO2 98% Physical Exam  Nursing note and vitals reviewed. Constitutional: He is oriented to person, place, and time. He appears well-developed and well-nourished. No distress.  HENT:  Head: Normocephalic and atraumatic.  No sinus pressure or nasal congestion  Eyes: Conjunctivae and EOM are normal. Pupils are equal, round, and reactive to light.  Scarring of the left cornea consistent with history of previous corneal transplant  Neck: Normal range of motion. Neck supple.  No meningeal sign  Cardiovascular: Normal rate and regular rhythm.   Pulmonary/Chest: Effort normal and breath sounds normal. No respiratory distress. He has no wheezes.  Neurological: He is alert and oriented to person, place, and time. He has normal strength. No cranial nerve deficit or sensory deficit. Coordination and gait normal.  Skin: Skin is warm. No rash noted.  Psychiatric: He has a normal mood and affect. His behavior is normal.    ED Course  Procedures   MDM  1. Headache      1:25 AM patient seen and evaluated. Patient appears uncomfortable but in no acute distress. Headache is similar to previous headaches however lasting longer. Imitrex was getting some relief on previous days however he did not take last night or this morning. He has normal nonfocal neuro exam without any concerning or red flag symptoms.  Patient feeling much better after a migraine cocktail. He is ready to return home and rest. He'll  continue Imitrex as needed. Patient also given headache and wellness clinic referral.  Angus Seller, PA-C 09/26/12 0981

## 2012-09-29 ENCOUNTER — Emergency Department (HOSPITAL_COMMUNITY)
Admission: EM | Admit: 2012-09-29 | Discharge: 2012-09-30 | Disposition: A | Discharge status: Home / Self Care | Payer: BC Managed Care – PPO | Attending: Emergency Medicine | Admitting: Emergency Medicine

## 2012-09-29 ENCOUNTER — Emergency Department (HOSPITAL_COMMUNITY): Payer: BC Managed Care – PPO

## 2012-09-29 ENCOUNTER — Encounter (HOSPITAL_COMMUNITY): Payer: Self-pay | Admitting: Emergency Medicine

## 2012-09-29 DIAGNOSIS — R109 Unspecified abdominal pain: Secondary | ICD-10-CM | POA: Insufficient documentation

## 2012-09-29 DIAGNOSIS — R748 Abnormal levels of other serum enzymes: Secondary | ICD-10-CM | POA: Insufficient documentation

## 2012-09-29 DIAGNOSIS — R7989 Other specified abnormal findings of blood chemistry: Secondary | ICD-10-CM

## 2012-09-29 DIAGNOSIS — R630 Anorexia: Secondary | ICD-10-CM | POA: Insufficient documentation

## 2012-09-29 DIAGNOSIS — R112 Nausea with vomiting, unspecified: Secondary | ICD-10-CM

## 2012-09-29 LAB — URINE MICROSCOPIC-ADD ON

## 2012-09-29 LAB — COMPREHENSIVE METABOLIC PANEL
ALT: 17 U/L (ref 0–53)
AST: 19 U/L (ref 0–37)
Alkaline Phosphatase: 81 U/L (ref 39–117)
CO2: 24 mEq/L (ref 19–32)
Calcium: 9 mg/dL (ref 8.4–10.5)
Chloride: 100 mEq/L (ref 96–112)
GFR calc non Af Amer: 38 mL/min — ABNORMAL LOW (ref >90)
Glucose, Bld: 108 mg/dL — ABNORMAL HIGH (ref 70–99)
Potassium: 3.7 mEq/L (ref 3.5–5.1)
Sodium: 137 mEq/L (ref 135–145)
Total Bilirubin: 0.5 mg/dL (ref 0.3–1.2)

## 2012-09-29 LAB — CBC
Hemoglobin: 14.7 g/dL (ref 13.0–17.0)
Platelets: 244 10*3/uL (ref 150–400)
RBC: 5.02 MIL/uL (ref 4.22–5.81)
WBC: 10.7 10*3/uL — ABNORMAL HIGH (ref 4.0–10.5)

## 2012-09-29 LAB — URINALYSIS, ROUTINE W REFLEX MICROSCOPIC
Bilirubin Urine: NEGATIVE
Glucose, UA: NEGATIVE mg/dL
Protein, ur: NEGATIVE mg/dL

## 2012-09-29 MED ORDER — FAMOTIDINE 20 MG PO TABS
20.0000 mg | ORAL_TABLET | Freq: Once | ORAL | Status: AC
  Administered 2012-09-29: 20 mg via ORAL
  Filled 2012-09-29: qty 1

## 2012-09-29 MED ORDER — GI COCKTAIL ~~LOC~~
30.0000 mL | Freq: Once | ORAL | Status: AC
  Administered 2012-09-29: 30 mL via ORAL
  Filled 2012-09-29: qty 30

## 2012-09-29 MED ORDER — IOHEXOL 300 MG/ML  SOLN
50.0000 mL | Freq: Once | INTRAMUSCULAR | Status: AC | PRN
  Administered 2012-09-29: 50 mL via ORAL

## 2012-09-29 MED ORDER — SODIUM CHLORIDE 0.9 % IV BOLUS (SEPSIS)
1000.0000 mL | Freq: Once | INTRAVENOUS | Status: AC
  Administered 2012-09-29: 1000 mL via INTRAVENOUS

## 2012-09-29 MED ORDER — ONDANSETRON HCL 8 MG PO TABS: 8.0000 mg | ORAL_TABLET | Freq: Three times a day (TID) | ORAL | Status: DC | PRN

## 2012-09-29 MED ORDER — ONDANSETRON 8 MG PO TBDP
8.0000 mg | ORAL_TABLET | Freq: Once | ORAL | Status: AC
  Administered 2012-09-29: 8 mg via ORAL
  Filled 2012-09-29: qty 1

## 2012-09-29 NOTE — ED Provider Notes (Signed)
CSN: [id number]     Arrival date & time 09/29/12  2024 History   First MD Initiated Contact with Patient 09/29/12 2055     Chief Complaint  Patient presents with  . Abdominal Pain   (Consider location/radiation/quality/duration/timing/severity/associated sxs/prior Treatment) Patient is a 37 y.o. male presenting with abdominal pain. The history is provided by the patient.  Abdominal Pain Associated symptoms: nausea and vomiting   Associated symptoms: no chest pain, no chills, no dysuria, no fever and no shortness of breath   pt co intermittent nv and abd cramping and pain for the past 2 days. +anorexia. Notes several episodes emesis, generally after eating/drinking, clear to color of recent ingested fluids. No bloody or bilious emesis. Stools sl loose but no severe diarrhea. Intermittent epigastric and mid abd cramping, associated w nv episodes. States abd pain worse in lower abd. Denies hx same symptoms prior. No known bad food ingestion, travel or ill contacts. No back or flank pain. No dysuria or gu c/o. No fever or chills. No abd distension or prior abd surgery. No cp or sob. No uri c/o.     Past Medical History  Diagnosis Date  . Medial meniscus tear 01/2012    right knee   Past Surgical History  Procedure Laterality Date  . Corneal transplant  12/2009  . Knee arthroscopy  02/20/2012    Procedure: ARTHROSCOPY KNEE;  Surgeon: Harvie Junior, MD;  Location: [facility];  Service: Orthopedics;  Laterality: Right;  Chondroplasia patella femoral joint, medial meniscal debriedment   History reviewed. No pertinent family history. History  Substance Use Topics  . Smoking status: Never Smoker   . Smokeless tobacco: Never Used  . Alcohol Use: No    Review of Systems  Constitutional: Negative for fever and chills.  HENT: Negative for neck pain.   Eyes: Negative for redness.  Respiratory: Negative for shortness of breath.   Cardiovascular: Negative for chest pain.   Gastrointestinal: Positive for nausea, vomiting and abdominal pain.  Genitourinary: Negative for dysuria and flank pain.  Musculoskeletal: Negative for back pain.  Skin: Negative for rash.  Neurological: Negative for headaches.  Hematological: Does not bruise/bleed easily.  Psychiatric/Behavioral: Negative for confusion.    Allergies  Review of patient's allergies indicates no known allergies.  Home Medications   Current Outpatient Rx  Name  Route  Sig  Dispense  Refill  . SUMAtriptan (IMITREX) 50 MG tablet   Oral   Take 50 mg by mouth every 2 (two) hours as needed for migraine.          BP 131/84  Pulse 90  Temp(Src) 98.8 F (37.1 C) (Oral)  Resp 18  SpO2 96% Physical Exam  Nursing note and vitals reviewed. Constitutional: He is oriented to person, place, and time. He appears well-developed and well-nourished. No distress.  Pt looks well, very comfortable appearing.   HENT:  Head: Atraumatic.  Mouth/Throat: Oropharynx is clear and moist.  Eyes: Conjunctivae are normal. No scleral icterus.  Neck: Neck supple. No tracheal deviation present.  Cardiovascular: Normal rate, regular rhythm, normal heart sounds and intact distal pulses.  Exam reveals no gallop and no friction rub.   No murmur heard. Pulmonary/Chest: Effort normal and breath sounds normal. No accessory muscle usage. No respiratory distress.  Abdominal: Soft. Bowel sounds are normal. He exhibits no distension and no mass. There is no tenderness. There is no rebound and no guarding.  Lower abd tenderness esp right. No rebound or guarding.   Genitourinary:  No cva tenderness  Musculoskeletal: Normal range of motion. He exhibits no edema and no tenderness.  Neurological: He is alert and oriented to person, place, and time.  Skin: Skin is warm and dry. He is not diaphoretic.  Psychiatric: He has a normal mood and affect.    ED Course  Procedures (including critical care time)  Results for orders placed  during the hospital encounter of 09/29/12  CBC      Result Value Range   WBC 10.7 (*) 4.0 - 10.5 K/uL   RBC 5.02  4.22 - 5.81 MIL/uL   Hemoglobin 14.7  13.0 - 17.0 g/dL   HCT 28.4  13.2 - 44.0 %   MCV 84.7  78.0 - 100.0 fL   MCH 29.3  26.0 - 34.0 pg   MCHC 34.6  30.0 - 36.0 g/dL   RDW 10.2  72.5 - 36.6 %   Platelets 244  150 - 400 K/uL  COMPREHENSIVE METABOLIC PANEL      Result Value Range   Sodium 137  135 - 145 mEq/L   Potassium 3.7  3.5 - 5.1 mEq/L   Chloride 100  96 - 112 mEq/L   CO2 24  19 - 32 mEq/L   Glucose, Bld 108 (*) 70 - 99 mg/dL   BUN 17  6 - 23 mg/dL   Creatinine, Ser 4.40 (*) 0.50 - 1.35 mg/dL   Calcium 9.0  8.4 - 34.7 mg/dL   Total Protein 8.0  6.0 - 8.3 g/dL   Albumin 3.8  3.5 - 5.2 g/dL   AST 19  0 - 37 U/L   ALT 17  0 - 53 U/L   Alkaline Phosphatase 81  39 - 117 U/L   Total Bilirubin 0.5  0.3 - 1.2 mg/dL   GFR calc non Af Amer 38 (*) >90 mL/min   GFR calc Af Amer 44 (*) >90 mL/min  LIPASE, BLOOD      Result Value Range   Lipase 26  11 - 59 U/L  URINALYSIS, ROUTINE W REFLEX MICROSCOPIC      Result Value Range   Color, Urine YELLOW  YELLOW   APPearance CLEAR  CLEAR   Specific Gravity, Urine 1.017  1.005 - 1.030   pH 5.0  5.0 - 8.0   Glucose, UA NEGATIVE  NEGATIVE mg/dL   Hgb urine dipstick MODERATE (*) NEGATIVE   Bilirubin Urine NEGATIVE  NEGATIVE   Ketones, ur NEGATIVE  NEGATIVE mg/dL   Protein, ur NEGATIVE  NEGATIVE mg/dL   Urobilinogen, UA 0.2  0.0 - 1.0 mg/dL   Nitrite NEGATIVE  NEGATIVE   Leukocytes, UA NEGATIVE  NEGATIVE  URINE MICROSCOPIC-ADD ON      Result Value Range   WBC, UA 0-2  <3 WBC/hpf   RBC / HPF 3-6  <3 RBC/hpf   Bacteria, UA RARE  RARE   Ct Abdomen Pelvis Wo Contrast  09/29/2012   CLINICAL DATA:  Abdominal pain.  EXAM: CT ABDOMEN AND PELVIS WITHOUT CONTRAST  TECHNIQUE: Multidetector CT imaging of the abdomen and pelvis was performed following the standard protocol without intravenous contrast.  COMPARISON:  05/04/2009   FINDINGS: Lung bases are clear. No effusions. Heart is normal size.  Liver, gallbladder, spleen, pancreas, adrenals and kidneys have an unremarkable unenhanced appearance. Appendix is visualized and is normal. Urinary bladder and prostate grossly unremarkable. Bowel grossly unremarkable. No free fluid, free air, or adenopathy. Aorta is normal caliber. No acute bony abnormality.  IMPRESSION: No acute findings in the abdomen  or pelvis.   Electronically Signed   By: Charlett Nose M.D.   On: 09/29/2012 23:35     MDM  Labs. zofran po. pepcid and gi cocktail po.  Reviewed nursing notes and prior charts for additional history.    given abd tendeness, pain, anorexia, will get ct.  As creatinine elev, will not give iv contrast.   Iv ns boluses.   2 liters ns iv.  Pt tolerating po.   Ct neg acute.  Post ivf, meds, pt completely asymptomatic.  Discussed cr with pt and need for f/u/recheck in 1 days time - pt agreeable, voices understanding.  Pt vitals normal, ct neg, no pain, abd soft nt, tolerating po, and appears stable for d/c.     Suzi Roots, MD 09/29/12 2344

## 2012-09-29 NOTE — ED Notes (Addendum)
Pt states he has been having abdominal pain since Thursday along with n/v. Denies diarrhea rates pain 6/10.

## 2012-10-01 ENCOUNTER — Emergency Department (INDEPENDENT_AMBULATORY_CARE_PROVIDER_SITE_OTHER)
Admission: EM | Admit: 2012-10-01 | Discharge: 2012-10-01 | Disposition: A | Discharge status: Home / Self Care | Payer: BC Managed Care – PPO | Source: Home / Self Care | Attending: Family Medicine | Admitting: Family Medicine

## 2012-10-01 ENCOUNTER — Encounter (HOSPITAL_COMMUNITY): Payer: Self-pay | Admitting: Emergency Medicine

## 2012-10-01 DIAGNOSIS — R799 Abnormal finding of blood chemistry, unspecified: Secondary | ICD-10-CM

## 2012-10-01 DIAGNOSIS — R319 Hematuria, unspecified: Secondary | ICD-10-CM

## 2012-10-01 DIAGNOSIS — R350 Frequency of micturition: Secondary | ICD-10-CM

## 2012-10-01 DIAGNOSIS — R7989 Other specified abnormal findings of blood chemistry: Secondary | ICD-10-CM

## 2012-10-01 LAB — POCT I-STAT, CHEM 8
Creatinine, Ser: 2.1 mg/dL — ABNORMAL HIGH (ref 0.50–1.35)
HCT: 48 % (ref 39.0–52.0)
Hemoglobin: 16.3 g/dL (ref 13.0–17.0)
Potassium: 4.5 mEq/L (ref 3.5–5.1)
Sodium: 144 mEq/L (ref 135–145)

## 2012-10-01 LAB — POCT URINALYSIS DIP (DEVICE)
Bilirubin Urine: NEGATIVE
Glucose, UA: NEGATIVE mg/dL
Ketones, ur: NEGATIVE mg/dL
Leukocytes, UA: NEGATIVE
Nitrite: NEGATIVE

## 2012-10-01 NOTE — ED Notes (Signed)
Call from patient, asking for [place] to call to make referral for him to [facility]

## 2012-10-01 NOTE — ED Provider Notes (Signed)
CSN: [id number]     Arrival date & time 10/01/12  1231 History   First MD Initiated Contact with Patient 10/01/12 1350     Chief Complaint  Patient presents with  . Urinary Frequency    since thurs. recent visit to [place] on 9/13   (Consider location/radiation/quality/duration/timing/severity/associated sxs/prior Treatment) HPI Comments: 37 year old male presents for evaluation of urinary frequency. This is been going on since approximately this past Thursday. He did have viral gastroenteritis and was treated with intravenous rehydration at [facility] emergency department a few days ago. At that time, he was noted to have increased serum creatinine level at 2.1. He was told to followup on this within one day to check for resolution. Today, he he is worried because the urinary frequency that was present throughout the gastroenteritis is still present. He is also feeling very fatigued. He has been trying to rehydrate himself aggressively with water and ginger ale. He denies abdominal pain, lower back pain, groin pain, history of prostate problems, previous history of kidney problems. No significant family history. No more nausea or vomiting, diarrhea has resolved. He feels complete emptying of his bladder, but has been peeing a large amount approximately every hour.  He is also still having left flank pain. This is been present throughout the episode of gastroenteritis. The pain is constant in the lower left flank around the left side of his abdomen. He denies any change or worsening in his pain, just no resolution  Patient is a 37 y.o. male presenting with frequency.  Urinary Frequency Pertinent negatives include no chest pain, no abdominal pain and no shortness of breath.    Past Medical History  Diagnosis Date  . Medial meniscus tear 01/2012    right knee   Past Surgical History  Procedure Laterality Date  . Corneal transplant  12/2009  . Knee arthroscopy  02/20/2012    Procedure:  ARTHROSCOPY KNEE;  Surgeon: Harvie Junior, MD;  Location: [facility];  Service: Orthopedics;  Laterality: Right;  Chondroplasia patella femoral joint, medial meniscal debriedment   History reviewed. No pertinent family history. History  Substance Use Topics  . Smoking status: Never Smoker   . Smokeless tobacco: Never Used  . Alcohol Use: No    Review of Systems  Constitutional: Positive for fatigue. Negative for fever and chills.  HENT: Negative for sore throat, neck pain and neck stiffness.   Eyes: Negative for visual disturbance.  Respiratory: Negative for cough and shortness of breath.   Cardiovascular: Negative for chest pain, palpitations and leg swelling.  Gastrointestinal: Negative for nausea, vomiting, abdominal pain, diarrhea and constipation.  Genitourinary: Positive for frequency and flank pain (left flank). Negative for dysuria, urgency and hematuria.  Musculoskeletal: Negative for myalgias and arthralgias.  Skin: Negative for rash.  Neurological: Negative for dizziness, weakness and light-headedness.    Allergies  Review of patient's allergies indicates no known allergies.  Home Medications   Current Outpatient Rx  Name  Route  Sig  Dispense  Refill  . Multiple Vitamins-Minerals (MULTIVITAMIN GUMMIES ADULT) CHEW   Oral   Chew 1 capsule by mouth daily.         . ondansetron (ZOFRAN) 8 MG tablet   Oral   Take 1 tablet (8 mg total) by mouth every 8 (eight) hours as needed for nausea.   8 tablet   0    BP 118/90  Pulse 84  Temp(Src) 98.5 F (36.9 C) (Oral)  Resp 12  SpO2 100%  Physical Exam  Nursing note and vitals reviewed. Constitutional: He is oriented to person, place, and time. He appears well-developed and well-nourished. No distress.  HENT:  Head: Normocephalic and atraumatic.  Cardiovascular: Normal rate and regular rhythm.   Pulmonary/Chest: Effort normal. No respiratory distress.  Abdominal: Soft. There is no tenderness. There  is no CVA tenderness.  Neurological: He is alert and oriented to person, place, and time. Coordination normal.  Skin: Skin is warm and dry. No rash noted. He is not diaphoretic.  Psychiatric: He has a normal mood and affect. Judgment normal.    ED Course  Procedures (including critical care time) Labs Review Labs Reviewed  GLUCOSE, CAPILLARY - Abnormal; Notable for the following:    Glucose-Capillary 105 (*)    All other components within normal limits  POCT URINALYSIS DIP (DEVICE) - Abnormal; Notable for the following:    Hgb urine dipstick MODERATE (*)    All other components within normal limits  POCT I-STAT, CHEM 8 - Abnormal; Notable for the following:    Creatinine, Ser 2.10 (*)    Glucose, Bld 111 (*)    All other components within normal limits   Imaging Review Ct Abdomen Pelvis Wo Contrast  09/29/2012   CLINICAL DATA:  Abdominal pain.  EXAM: CT ABDOMEN AND PELVIS WITHOUT CONTRAST  TECHNIQUE: Multidetector CT imaging of the abdomen and pelvis was performed following the standard protocol without intravenous contrast.  COMPARISON:  05/04/2009  FINDINGS: Lung bases are clear. No effusions. Heart is normal size.  Liver, gallbladder, spleen, pancreas, adrenals and kidneys have an unremarkable unenhanced appearance. Appendix is visualized and is normal. Urinary bladder and prostate grossly unremarkable. Bowel grossly unremarkable. No free fluid, free air, or adenopathy. Aorta is normal caliber. No acute bony abnormality.  IMPRESSION: No acute findings in the abdomen or pelvis.   Electronically Signed   By: Charlett Nose M.D.   On: 09/29/2012 23:35    MDM   1. Urinary frequency   2. Creatinine elevation   3. Hematuria    Physical exam is normal. Labs reveal hematuria, stable elevation in his serum creatinine, still at 2.1. I will refer this patient to nephrology for further evaluation of possible chronic kidney disease.     Graylon Good, PA-C 10/01/12 [phone]

## 2012-10-01 NOTE — ED Notes (Signed)
Chart review; asking for records to be sent to [facility]

## 2012-10-01 NOTE — ED Notes (Signed)
C/o urinary frequency with left flank pain since Thursday. Denies fever,n/v/d. Recent visit at [facility] on 9/13. Pt questions if dehydrated.  States thurs through sat. Unable to keep down any food or liquids.

## 2012-10-02 ENCOUNTER — Telehealth (HOSPITAL_COMMUNITY): Payer: Self-pay | Admitting: *Deleted

## 2012-10-02 NOTE — ED Notes (Signed)
Patient called requesting another day off-dr corey agreed.  Printed note as instructed.  Patient requested to be left at front desk, patricia mc gregor to pick up for patient.  Would ask medical records to fax [facility] to [facility].

## 2012-10-02 NOTE — ED Provider Notes (Signed)
Medical screening examination/treatment/procedure(s) were performed by a resident physician or non-physician practitioner and as the supervising physician I was immediately available for consultation/collaboration.  Aisa Schoeppner, MD   Avaneesh Pepitone S Dennys Traughber, MD 10/02/12 1338 

## 2012-10-02 NOTE — ED Notes (Signed)
Pt. called about his creatinine level. Pt. verified and given result 2.10 H. Pt. asked if this has anything to due with his knee pain. I told him no, it indicates his kidney function. I asked if he had a PCP.  He said he has an appointment at the [facility] as a new pt. I told him they can follow this up for him. Vassie Moselle 10/02/2012

## 2012-10-03 NOTE — ED Notes (Signed)
Referral forms completed , and re faxed to urology office

## 2012-10-04 ENCOUNTER — Emergency Department (INDEPENDENT_AMBULATORY_CARE_PROVIDER_SITE_OTHER)
Admission: EM | Admit: 2012-10-04 | Discharge: 2012-10-04 | Disposition: A | Discharge status: Home / Self Care | Payer: BC Managed Care – PPO | Source: Home / Self Care | Attending: Emergency Medicine | Admitting: Emergency Medicine

## 2012-10-04 ENCOUNTER — Encounter (HOSPITAL_COMMUNITY): Payer: Self-pay | Admitting: *Deleted

## 2012-10-04 ENCOUNTER — Emergency Department (INDEPENDENT_AMBULATORY_CARE_PROVIDER_SITE_OTHER): Payer: BC Managed Care – PPO

## 2012-10-04 DIAGNOSIS — R1013 Epigastric pain: Secondary | ICD-10-CM

## 2012-10-04 DIAGNOSIS — K3189 Other diseases of stomach and duodenum: Secondary | ICD-10-CM

## 2012-10-04 DIAGNOSIS — R112 Nausea with vomiting, unspecified: Secondary | ICD-10-CM

## 2012-10-04 LAB — CBC WITH DIFFERENTIAL/PLATELET
Basophils Absolute: 0 10*3/uL (ref 0.0–0.1)
Basophils Relative: 0 % (ref 0–1)
Eosinophils Absolute: 0.1 10*3/uL (ref 0.0–0.7)
Eosinophils Relative: 2 % (ref 0–5)
HCT: 44.3 % (ref 39.0–52.0)
Hemoglobin: 15 g/dL (ref 13.0–17.0)
Lymphocytes Relative: 31 % (ref 12–46)
Lymphs Abs: 2.9 10*3/uL (ref 0.7–4.0)
MCH: 29.4 pg (ref 26.0–34.0)
MCHC: 33.9 g/dL (ref 30.0–36.0)
MCV: 86.7 fL (ref 78.0–100.0)
Monocytes Absolute: 0.5 10*3/uL (ref 0.1–1.0)
Monocytes Relative: 5 % (ref 3–12)
Neutro Abs: 5.8 10*3/uL (ref 1.7–7.7)
Neutrophils Relative %: 62 % (ref 43–77)
Platelets: 268 10*3/uL (ref 150–400)
RBC: 5.11 MIL/uL (ref 4.22–5.81)
RDW: 13.6 % (ref 11.5–15.5)
WBC: 9.4 10*3/uL (ref 4.0–10.5)

## 2012-10-04 LAB — POCT I-STAT, CHEM 8
BUN: 15 mg/dL (ref 6–23)
Calcium, Ion: 1.19 mmol/L (ref 1.12–1.23)
Chloride: 105 mEq/L (ref 96–112)
Creatinine, Ser: 1.8 mg/dL — ABNORMAL HIGH (ref 0.50–1.35)
Glucose, Bld: 107 mg/dL — ABNORMAL HIGH (ref 70–99)
HCT: 49 % (ref 39.0–52.0)
Hemoglobin: 16.7 g/dL (ref 13.0–17.0)
Potassium: 4.1 mEq/L (ref 3.5–5.1)
Sodium: 143 mEq/L (ref 135–145)
TCO2: 28 mmol/L (ref 0–100)

## 2012-10-04 LAB — POCT URINALYSIS DIP (DEVICE)
Bilirubin Urine: NEGATIVE
Ketones, ur: NEGATIVE mg/dL
Leukocytes, UA: NEGATIVE
pH: 5.5 (ref 5.0–8.0)

## 2012-10-04 MED ORDER — ONDANSETRON 4 MG PO TBDP
8.0000 mg | ORAL_TABLET | Freq: Once | ORAL | Status: AC
  Administered 2012-10-04: 8 mg via ORAL

## 2012-10-04 MED ORDER — ONDANSETRON 8 MG PO TBDP: 8.0000 mg | ORAL_TABLET | Freq: Three times a day (TID) | ORAL | Status: DC | PRN

## 2012-10-04 MED ORDER — GI COCKTAIL ~~LOC~~
ORAL | Status: AC
  Filled 2012-10-04: qty 30

## 2012-10-04 MED ORDER — HYDROCODONE-ACETAMINOPHEN 5-325 MG PO TABS: ORAL_TABLET | ORAL | Status: DC

## 2012-10-04 MED ORDER — ESOMEPRAZOLE MAGNESIUM 40 MG PO CPDR: 40.0000 mg | DELAYED_RELEASE_CAPSULE | Freq: Every day | ORAL | Status: DC

## 2012-10-04 MED ORDER — ONDANSETRON 4 MG PO TBDP
ORAL_TABLET | ORAL | Status: AC
  Filled 2012-10-04: qty 2

## 2012-10-04 MED ORDER — SUCRALFATE 1 G PO TABS: 1.0000 g | ORAL_TABLET | Freq: Three times a day (TID) | ORAL | Status: DC

## 2012-10-04 MED ORDER — GI COCKTAIL ~~LOC~~
30.0000 mL | Freq: Once | ORAL | Status: AC
  Administered 2012-10-04: 30 mL via ORAL

## 2012-10-04 NOTE — ED Notes (Signed)
Pt    Reports  She  Was  Seen  sev  Times       Over last  Week   For      Dehydration          -  He  Was  Seen  2  Days        Ago   Elevated  Creat     -  He  Is  Not  Vomiting  Yet     He     Has  Slight diarrhea  And  Has  Nausea

## 2012-10-04 NOTE — ED Notes (Signed)
[facility] faxed a form that said pt. is rated 3 out of 4, in urgency. Pt.'s 3-4 are scheduled to be seen in 6-8 weeks. They are working to get all pt.'s scheduled as quickly as possible. Derrick Scott 10/03/12

## 2012-10-04 NOTE — ED Provider Notes (Signed)
Chief Complaint:   Chief Complaint  Patient presents with  . Nausea    History of Present Illness:    Khalee Mazo II is a 37 year old male who is here today for the fourth time in the past 8 days. He's been here for different problems on each occasion. He first presented to the emergency room on September 10 with a migraine headache. This was treated with Imitrex. The headache resolved. He then presented on September 13 with upper abdominal pain, nausea, and vomiting. This is been going on for 2 days at the time. He was given some GI cocktail, ranitidine, and Zofran. His symptoms got better in the emergency room and he was sent home. When he presented on September 15, just 3 days ago, he had frequent urination. No definite cause for this was found, but was noted that his creatinine was elevated at 2.1. He was referred to [facility] for nephrology consultation and he was not given any other definite treatment. He is in the process of getting the appointment set up with [facility].  He presents today with ongoing midabdominal pain. This occurs after meals and is been going on for the past 7 days. He describes it as a crampy pain rated 9/10 in intensity at the most and now is down to a 5/10. It's been accompanied by nausea and occasionally vomiting. He's not vomited in the past 48 hours. He denies any hematemesis, coffee-ground emesis, or bilious emesis. He's had no fever, chills, muscle aches, or URI symptoms. His weight is down about 7 pounds and his appetite is diminished. He had 4 loose stools yesterday without blood or mucus. He still having some urinary frequency, but this is improved. He's been concerned about his kidney function and has been eating nothing but smoothies and trying to avoid protein completely. He denies any use of alcohol, tobacco, aspirin, or NSAIDs. He does drink occasional tea. There is no history of ulcer disease, gastritis, reflux esophagitis, or  gallstones.  Review of Systems:  Other than noted above, the patient denies any of the following symptoms: Constitutional:  No fever, chills, fatigue, weight loss or anorexia. Lungs:  No cough or shortness of breath. Heart:  No chest pain, palpitations, syncope or edema.  No cardiac history. Abdomen:  No nausea, vomiting, hematememesis, melena, diarrhea, or hematochezia. GU:  No dysuria, frequency, urgency, or hematuria.  No testicular pain or swelling.  PMFSH:  Past medical history, family history, social history, meds, and allergies were reviewed along with nurse's notes.  No prior abdominal surgeries or history of GI problems.  No use of NSAIDs or aspirin.  No excessive  alcohol intake. He has migraine headaches and takes Imitrex.  Physical Exam:   Vital signs:  BP 128/78  Pulse 90  Temp(Src) 98.6 F (37 C) (Oral)  Resp 16  SpO2 100% Gen:  Alert, oriented, in no distress. Lungs:  Breath sounds clear and equal bilaterally.  No wheezes, rales or rhonchi. Heart:  Regular rhythm.  No gallops or murmers.   Abdomen:  Soft, mildly distended, and mildly tender. No organomegaly or mass. Bowel sounds were present but diminished. There is mild tenderness to palpation in the periumbilical area and the left lower quadrant. No guarding or rebound. Rectal exam:  Normal sphincter tone, no masses, no tenderness, normal prostate, and heme-negative stool. Skin:  Clear, warm and dry.  No rash.  Labs:   Results for orders placed during the hospital encounter of 10/04/12  CBC WITH DIFFERENTIAL  Result Value Range   WBC 9.4  4.0 - 10.5 K/uL   RBC 5.11  4.22 - 5.81 MIL/uL   Hemoglobin 15.0  13.0 - 17.0 g/dL   HCT 16.1  09.6 - 04.5 %   MCV 86.7  78.0 - 100.0 fL   MCH 29.4  26.0 - 34.0 pg   MCHC 33.9  30.0 - 36.0 g/dL   RDW 40.9  81.1 - 91.4 %   Platelets 268  150 - 400 K/uL   Neutrophils Relative % 62  43 - 77 %   Neutro Abs 5.8  1.7 - 7.7 K/uL   Lymphocytes Relative 31  12 - 46 %   Lymphs Abs  2.9  0.7 - 4.0 K/uL   Monocytes Relative 5  3 - 12 %   Monocytes Absolute 0.5  0.1 - 1.0 K/uL   Eosinophils Relative 2  0 - 5 %   Eosinophils Absolute 0.1  0.0 - 0.7 K/uL   Basophils Relative 0  0 - 1 %   Basophils Absolute 0.0  0.0 - 0.1 K/uL  OCCULT BLOOD, POC DEVICE      Result Value Range   Fecal Occult Bld NEGATIVE  NEGATIVE  POCT I-STAT, CHEM 8      Result Value Range   Sodium 143  135 - 145 mEq/L   Potassium 4.1  3.5 - 5.1 mEq/L   Chloride 105  96 - 112 mEq/L   BUN 15  6 - 23 mg/dL   Creatinine, Ser 7.82 (*) 0.50 - 1.35 mg/dL   Glucose, Bld 956 (*) 70 - 99 mg/dL   Calcium, Ion 2.13  0.86 - 1.23 mmol/L   TCO2 28  0 - 100 mmol/L   Hemoglobin 16.7  13.0 - 17.0 g/dL   HCT 57.8  46.9 - 62.9 %  POCT H PYLORI SCREEN      Result Value Range   H. PYLORI SCREEN, POC NEGATIVE  NEGATIVE  POCT URINALYSIS DIP (DEVICE)      Result Value Range   Glucose, UA NEGATIVE  NEGATIVE mg/dL   Bilirubin Urine NEGATIVE  NEGATIVE   Ketones, ur NEGATIVE  NEGATIVE mg/dL   Specific Gravity, Urine 1.015  1.005 - 1.030   Hgb urine dipstick SMALL (*) NEGATIVE   pH 5.5  5.0 - 8.0   Protein, ur NEGATIVE  NEGATIVE mg/dL   Urobilinogen, UA 0.2  0.0 - 1.0 mg/dL   Nitrite NEGATIVE  NEGATIVE   Leukocytes, UA NEGATIVE  NEGATIVE     Radiology:  Ct Abdomen Pelvis Wo Contrast  09/29/2012   CLINICAL DATA:  Abdominal pain.  EXAM: CT ABDOMEN AND PELVIS WITHOUT CONTRAST  TECHNIQUE: Multidetector CT imaging of the abdomen and pelvis was performed following the standard protocol without intravenous contrast.  COMPARISON:  05/04/2009  FINDINGS: Lung bases are clear. No effusions. Heart is normal size.  Liver, gallbladder, spleen, pancreas, adrenals and kidneys have an unremarkable unenhanced appearance. Appendix is visualized and is normal. Urinary bladder and prostate grossly unremarkable. Bowel grossly unremarkable. No free fluid, free air, or adenopathy. Aorta is normal caliber. No acute bony abnormality.  IMPRESSION:  No acute findings in the abdomen or pelvis.   Electronically Signed   By: Charlett Nose M.D.   On: 09/29/2012 23:35   Dg Abd Acute W/chest  10/04/2012   *RADIOLOGY REPORT*  Clinical Data: Pain and nausea and vomiting.  ACUTE ABDOMEN SERIES (ABDOMEN 2 VIEW & CHEST 1 VIEW)  Comparison: Chest x-ray dated 10/03/2010 and scout  image for abdominal CT scan dated 09/29/2012  Findings: The heart and lungs appear normal.  There is no free air or free fluid in the abdomen.  No dilated loops of bowel.  No abnormal abdominal calcifications.  No osseous abnormality.  IMPRESSION: Benign-appearing abdomen and chest.   Original Report Authenticated By: Francene Boyers, M.D.    Course in Urgent Care Center:   Given GI cocktail 30 mL and Zofran ODT 8 mg sublingually. He experienced relief of symptoms thereafter.  Assessment:  The primary encounter diagnosis was Dyspepsia. A diagnosis of Nausea and vomiting was also pertinent to this visit.  Differential diagnosis includes ulcer, gastritis, reflux esophagitis, gastroenteritis, and gallbladder disease. We'll need followup with gastroenterology. His creatinine is improving, but he will need followup with a nephrologist.  Plan:   1.  Meds:  The following meds were prescribed:   Discharge Medication List as of 10/04/2012 12:44 PM    START taking these medications   Details  esomeprazole (NEXIUM) 40 MG capsule Take 1 capsule (40 mg total) by mouth daily., Starting 10/04/2012, Until Discontinued, Normal    HYDROcodone-acetaminophen (NORCO/VICODIN) 5-325 MG per tablet 1 to 2 tabs every 4 to 6 hours as needed for pain., Print    ondansetron (ZOFRAN ODT) 8 MG disintegrating tablet Take 1 tablet (8 mg total) by mouth every 8 (eight) hours as needed for nausea., Starting 10/04/2012, Until Discontinued, Normal    sucralfate (CARAFATE) 1 G tablet Take 1 tablet (1 g total) by mouth 4 (four) times daily -  before meals and at bedtime., Starting 10/04/2012, Until Discontinued, Normal         2.  Patient Education/Counseling:  The patient was given appropriate handouts, self care instructions, and instructed in symptomatic relief.  Instructed in ulcer diet.  3.  Follow up:  The patient was told to follow up if no better in 3 to 4 days, if becoming worse in any way, and given some red flag symptoms such as persistent vomiting or any sign of bleeding which would prompt immediate return.  Follow up with Dr. Elnoria Howard as soon as possible.      Reuben Likes, MD 10/04/12 [phone]

## 2012-10-08 NOTE — ED Notes (Signed)
Note extended.

## 2012-10-11 ENCOUNTER — Emergency Department (INDEPENDENT_AMBULATORY_CARE_PROVIDER_SITE_OTHER)
Admission: EM | Admit: 2012-10-11 | Discharge: 2012-10-11 | Disposition: A | Discharge status: Home / Self Care | Payer: BC Managed Care – PPO | Source: Home / Self Care | Attending: Family Medicine | Admitting: Family Medicine

## 2012-10-11 ENCOUNTER — Encounter (HOSPITAL_COMMUNITY): Payer: Self-pay | Admitting: Emergency Medicine

## 2012-10-11 DIAGNOSIS — K2990 Gastroduodenitis, unspecified, without bleeding: Secondary | ICD-10-CM

## 2012-10-11 DIAGNOSIS — K2970 Gastritis, unspecified, without bleeding: Secondary | ICD-10-CM

## 2012-10-11 LAB — POCT I-STAT, CHEM 8
BUN: 12 mg/dL (ref 6–23)
Calcium, Ion: 1.17 mmol/L (ref 1.12–1.23)
Chloride: 102 mEq/L (ref 96–112)

## 2012-10-11 LAB — POCT URINALYSIS DIP (DEVICE)
Bilirubin Urine: NEGATIVE
Glucose, UA: NEGATIVE mg/dL
Specific Gravity, Urine: 1.02 (ref 1.005–1.030)
Urobilinogen, UA: 0.2 mg/dL (ref 0.0–1.0)

## 2012-10-11 MED ORDER — GI COCKTAIL ~~LOC~~
30.0000 mL | Freq: Once | ORAL | Status: AC
  Administered 2012-10-11: 30 mL via ORAL

## 2012-10-11 MED ORDER — PANTOPRAZOLE SODIUM 40 MG PO TBEC: 40.0000 mg | DELAYED_RELEASE_TABLET | Freq: Every day | ORAL | Status: DC

## 2012-10-11 MED ORDER — GI COCKTAIL ~~LOC~~
ORAL | Status: AC
  Filled 2012-10-11: qty 30

## 2012-10-11 NOTE — ED Notes (Signed)
Pt c/o abdominal pain x 2 wks. Has n/v/d. This is a ongoing problem that has gradually gotten worse. Pt was seen and treated but unable to get Rxs until tomorrow due to cost. 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 Pt is alert and oriented.

## 2012-10-11 NOTE — ED Provider Notes (Signed)
CSN: [id number]     Arrival date & time 10/11/12  1310 History   First MD Initiated Contact with Patient 10/11/12 1450     Chief Complaint  Patient presents with  . Abdominal Pain   (Consider location/radiation/quality/duration/timing/severity/associated sxs/prior Treatment) Patient is a 37 y.o. male presenting with abdominal pain. The history is provided by the patient and the spouse.  Abdominal Pain This is a recurrent problem. Episode onset: pt with mult visits to [facility] and [facility] over past sev weeks with mult complaints, has not gotten meds filled from last visit, has not had f/u as advised. still with n/v/d., concern about work relationship to chemicals.. Associated symptoms include abdominal pain.    Past Medical History  Diagnosis Date  . Medial meniscus tear 01/2012    right knee   Past Surgical History  Procedure Laterality Date  . Corneal transplant  12/2009  . Knee arthroscopy  02/20/2012    Procedure: ARTHROSCOPY KNEE;  Surgeon: Harvie Junior, MD;  Location: [facility];  Service: Orthopedics;  Laterality: Right;  Chondroplasia patella femoral joint, medial meniscal debriedment   History reviewed. No pertinent family history. History  Substance Use Topics  . Smoking status: Never Smoker   . Smokeless tobacco: Never Used  . Alcohol Use: No    Review of Systems  Constitutional: Negative.   Gastrointestinal: Positive for nausea, vomiting, abdominal pain and diarrhea. Negative for abdominal distention.    Allergies  Review of patient's allergies indicates no known allergies.  Home Medications   Current Outpatient Rx  Name  Route  Sig  Dispense  Refill  . esomeprazole (NEXIUM) 40 MG capsule   Oral   Take 1 capsule (40 mg total) by mouth daily.   30 capsule   0   . HYDROcodone-acetaminophen (NORCO/VICODIN) 5-325 MG per tablet      1 to 2 tabs every 4 to 6 hours as needed for pain.   20 tablet   0   . Multiple Vitamins-Minerals  (MULTIVITAMIN GUMMIES ADULT) CHEW   Oral   Chew 1 capsule by mouth daily.         . ondansetron (ZOFRAN ODT) 8 MG disintegrating tablet   Oral   Take 1 tablet (8 mg total) by mouth every 8 (eight) hours as needed for nausea.   20 tablet   0   . ondansetron (ZOFRAN) 8 MG tablet   Oral   Take 1 tablet (8 mg total) by mouth every 8 (eight) hours as needed for nausea.   8 tablet   0   . pantoprazole (PROTONIX) 40 MG tablet   Oral   Take 1 tablet (40 mg total) by mouth daily.   30 tablet   1   . sucralfate (CARAFATE) 1 G tablet   Oral   Take 1 tablet (1 g total) by mouth 4 (four) times daily -  before meals and at bedtime.   120 tablet   0    BP 117/82  Pulse 98  Temp(Src) 97.9 F (36.6 C) (Oral)  Resp 16  SpO2 100% Physical Exam  Nursing note and vitals reviewed. Constitutional: He is oriented to person, place, and time. He appears well-developed. No distress.  HENT:  Mouth/Throat: Oropharynx is clear and moist.  Neck: Normal range of motion. Neck supple.  Pulmonary/Chest: Breath sounds normal.  Abdominal: Soft. Bowel sounds are normal. He exhibits no distension and no mass. There is no tenderness. There is no rebound and no guarding.  Lymphadenopathy:  He has no cervical adenopathy.  Neurological: He is alert and oriented to person, place, and time.  Skin: Skin is warm and dry.    ED Course  Procedures (including critical care time) Labs Review Labs Reviewed  POCT I-STAT, CHEM 8 - Abnormal; Notable for the following:    Creatinine, Ser 1.40 (*)    Glucose, Bld 104 (*)    Hemoglobin 17.3 (*)    All other components within normal limits  POCT URINALYSIS DIP (DEVICE) - Abnormal; Notable for the following:    Hgb urine dipstick MODERATE (*)    Leukocytes, UA TRACE (*)    All other components within normal limits   Imaging Review No results found.  MDM      Linna Hoff, MD 10/11/12 [phone]

## 2012-10-24 ENCOUNTER — Ambulatory Visit (INDEPENDENT_AMBULATORY_CARE_PROVIDER_SITE_OTHER): Payer: BC Managed Care – PPO | Admitting: Family Medicine

## 2012-10-24 ENCOUNTER — Encounter: Payer: Self-pay | Admitting: Family Medicine

## 2012-10-24 VITALS — BP 120/77 | HR 104 | Temp 99.4°F | Ht 68.0 in | Wt 263.0 lb

## 2012-10-24 DIAGNOSIS — IMO0002 Reserved for concepts with insufficient information to code with codable children: Secondary | ICD-10-CM

## 2012-10-24 DIAGNOSIS — R7989 Other specified abnormal findings of blood chemistry: Secondary | ICD-10-CM

## 2012-10-24 DIAGNOSIS — R109 Unspecified abdominal pain: Secondary | ICD-10-CM

## 2012-10-24 DIAGNOSIS — R799 Abnormal finding of blood chemistry, unspecified: Secondary | ICD-10-CM

## 2012-10-24 LAB — COMPREHENSIVE METABOLIC PANEL
AST: 17 U/L (ref 0–37)
Alkaline Phosphatase: 76 U/L (ref 39–117)
BUN: 10 mg/dL (ref 6–23)
Calcium: 9.1 mg/dL (ref 8.4–10.5)
Creat: 1.26 mg/dL (ref 0.50–1.35)

## 2012-10-24 LAB — POCT UA - MICROSCOPIC ONLY

## 2012-10-24 LAB — POCT URINALYSIS DIPSTICK
Glucose, UA: NEGATIVE
Ketones, UA: NEGATIVE
Spec Grav, UA: 1.025

## 2012-10-24 NOTE — Patient Instructions (Signed)
I will send you a note about your blood work. I am giving you a RX for a fiber medicine---I would do 1/2 scoop a day EVERY day and let me see you back in 3-4 weeks Great to see you!

## 2012-10-26 NOTE — Progress Notes (Signed)
  Subjective:    Patient ID: Derrick Scott, male    DOB: [birth year], 37 y.o.   MRN: [id number]  HPI New patient. Recently had some abdominal pain. Was seen at urgent care and at the emergency department. Her to have elevated creatinine and dose of those visits. Has subsequent visit where it was returning more to normal. Abdominal pain has significantly improved. Must establish care.   Review of Systems No fever, sweats, chills, unusual weight change.    Objective:   Physical Exam  Vital signs reviewed GENERALl: Well developed, well nourished, in no acute distress. NECK: Supple, FROM, without lymphadenopathy.  THYROID: normal without nodularity CAROTID ARTERIES: without bruits LUNGS: clear to auscultation bilaterally. No wheezes or rales. HEART: Regular rate and rhythm, no murmurs ABDOMEN: soft with positive bowel sounds MSK: MOE x 4 SKIN no rash NEURO: no focal deficits       Assessment & Plan:  #1. Abdominal pain. Allergy his x-rays and he has some evidence of constipation saw start him on MiraLax at half dose and follow him up in 3 weeks. #2. History of elevated creatinine. He thinks it may be related to some exposure to toxic fluid at work. They've now given him a respirator. I don't have the repeat value that is back in the normal range so we'll get that today and see him back in 3 weeks.

## 2012-10-30 ENCOUNTER — Encounter: Payer: Self-pay | Admitting: Family Medicine

## 2012-11-21 ENCOUNTER — Ambulatory Visit: Payer: BC Managed Care – PPO | Admitting: Family Medicine

## 2013-01-17 DIAGNOSIS — H8110 Benign paroxysmal vertigo, unspecified ear: Secondary | ICD-10-CM

## 2013-01-17 HISTORY — DX: Benign paroxysmal vertigo, unspecified ear: H81.10

## 2013-02-05 ENCOUNTER — Emergency Department (INDEPENDENT_AMBULATORY_CARE_PROVIDER_SITE_OTHER)
Admission: EM | Admit: 2013-02-05 | Discharge: 2013-02-05 | Disposition: A | Discharge status: Home / Self Care | Payer: BC Managed Care – PPO | Source: Home / Self Care | Attending: Family Medicine | Admitting: Family Medicine

## 2013-02-05 ENCOUNTER — Emergency Department (INDEPENDENT_AMBULATORY_CARE_PROVIDER_SITE_OTHER): Payer: BC Managed Care – PPO

## 2013-02-05 ENCOUNTER — Encounter (HOSPITAL_COMMUNITY): Payer: Self-pay | Admitting: Emergency Medicine

## 2013-02-05 DIAGNOSIS — IMO0002 Reserved for concepts with insufficient information to code with codable children: Secondary | ICD-10-CM

## 2013-02-05 DIAGNOSIS — S46912A Strain of unspecified muscle, fascia and tendon at shoulder and upper arm level, left arm, initial encounter: Secondary | ICD-10-CM

## 2013-02-05 DIAGNOSIS — M65979 Unspecified synovitis and tenosynovitis, unspecified ankle and foot: Secondary | ICD-10-CM

## 2013-02-05 DIAGNOSIS — S56912A Strain of unspecified muscles, fascia and tendons at forearm level, left arm, initial encounter: Secondary | ICD-10-CM

## 2013-02-05 DIAGNOSIS — M7752 Other enthesopathy of left foot: Secondary | ICD-10-CM

## 2013-02-05 DIAGNOSIS — M659 Synovitis and tenosynovitis, unspecified: Secondary | ICD-10-CM

## 2013-02-05 MED ORDER — MELOXICAM 7.5 MG PO TABS: 7.5000 mg | ORAL_TABLET | Freq: Every day | ORAL | Status: DC

## 2013-02-05 NOTE — Discharge Instructions (Signed)
Use medicine as needed, see orthopedist if further problems. °

## 2013-02-05 NOTE — ED Provider Notes (Signed)
CSN: [id number]     Arrival date & time 02/05/13  0804 History   First MD Initiated Contact with Patient 02/05/13 [phone]     Chief Complaint  Patient presents with  . Extremity Weakness   (Consider location/radiation/quality/duration/timing/severity/associated sxs/prior Treatment) Patient is a 38 y.o. male presenting with extremity weakness. The history is provided by the patient.  Extremity Weakness This is a new problem. The current episode started more than 1 week ago (sev mos h/o pain in left elbow and ankle, NKI, limitation of rom.). The problem has not changed since onset.Pertinent negatives include no chest pain and no abdominal pain. The symptoms are aggravated by bending.    Past Medical History  Diagnosis Date  . Medial meniscus tear 01/2012    right knee   Past Surgical History  Procedure Laterality Date  . Corneal transplant  12/2009  . Knee arthroscopy  02/20/2012    Procedure: ARTHROSCOPY KNEE;  Surgeon: Alta Corning, MD;  Location: [facility];  Service: Orthopedics;  Laterality: Right;  Chondroplasia patella femoral joint, medial meniscal debriedment   History reviewed. No pertinent family history. History  Substance Use Topics  . Smoking status: Never Smoker   . Smokeless tobacco: Never Used  . Alcohol Use: No    Review of Systems  Constitutional: Negative.   Cardiovascular: Negative for chest pain.  Gastrointestinal: Negative for abdominal pain.  Musculoskeletal: Positive for arthralgias and extremity weakness. Negative for back pain, gait problem and joint swelling.    Allergies  Review of patient's allergies indicates no known allergies.  Home Medications   Current Outpatient Rx  Name  Route  Sig  Dispense  Refill  . esomeprazole (NEXIUM) 40 MG capsule   Oral   Take 1 capsule (40 mg total) by mouth daily.   30 capsule   0   . HYDROcodone-acetaminophen (NORCO/VICODIN) 5-325 MG per tablet      1 to 2 tabs every 4 to 6 hours as needed  for pain.   20 tablet   0   . meloxicam (MOBIC) 7.5 MG tablet   Oral   Take 1 tablet (7.5 mg total) by mouth daily.   30 tablet   1   . Multiple Vitamins-Minerals (MULTIVITAMIN GUMMIES ADULT) CHEW   Oral   Chew 1 capsule by mouth daily.         . ondansetron (ZOFRAN ODT) 8 MG disintegrating tablet   Oral   Take 1 tablet (8 mg total) by mouth every 8 (eight) hours as needed for nausea.   20 tablet   0   . ondansetron (ZOFRAN) 8 MG tablet   Oral   Take 1 tablet (8 mg total) by mouth every 8 (eight) hours as needed for nausea.   8 tablet   0   . pantoprazole (PROTONIX) 40 MG tablet   Oral   Take 1 tablet (40 mg total) by mouth daily.   30 tablet   1   . sucralfate (CARAFATE) 1 G tablet   Oral   Take 1 tablet (1 g total) by mouth 4 (four) times daily -  before meals and at bedtime.   120 tablet   0    BP 138/70  Pulse 72  Temp(Src) 98.6 F (37 C) (Oral)  Resp 16  SpO2 100% Physical Exam  Nursing note and vitals reviewed. Constitutional: He is oriented to person, place, and time. He appears well-developed and well-nourished.  Musculoskeletal: He exhibits tenderness.  Left elbow: He exhibits decreased range of motion. He exhibits no swelling, no effusion and no deformity. Tenderness found. Radial head, medial epicondyle and lateral epicondyle tenderness noted.       Left ankle: He exhibits normal range of motion, no swelling and normal pulse. Tenderness. Lateral malleolus and medial malleolus tenderness found. No head of 5th metatarsal and no proximal fibula tenderness found. Achilles tendon normal.  Neurological: He is alert and oriented to person, place, and time.  Skin: Skin is warm and dry.    ED Course  Procedures (including critical care time) Labs Review Labs Reviewed - No data to display Imaging Review Dg Elbow Complete Left  02/05/2013   CLINICAL DATA:  Chronic elbow pain and decreased range of motion without history of prior injury  EXAM: LEFT  ELBOW - COMPLETE 3+ VIEW  COMPARISON:  Prior radiographs of the left forearm 05/05/2005  FINDINGS: No evidence of acute fracture, dislocation or joint effusion. Normal bony mineralization without lytic or blastic osseous lesions. Mild degenerative changes with small osteophyte formation at the coronoid process of the elbow and enthesopathy at the olecranon process in the region of the triceps tendon insertion. These changes are relatively mild.  IMPRESSION: 1. Mild degenerative change as above. 2. No acute osseous or soft tissue abnormality.   Electronically Signed   By: Jacqulynn Cadet M.D.   On: 02/05/2013 09:01   Dg Ankle Complete Left  02/05/2013   CLINICAL DATA:  Left ankle pain medially.  EXAM: LEFT ANKLE COMPLETE - 3+ VIEW  COMPARISON:  None.  FINDINGS: There is dorsal spurring at the talonavicular articulation along with plantar and Achilles calcaneal spurs. The malleoli appear intact, as do the talar dome and tibial plafond. Base of the fifth metatarsal intact. No acute bony finding observed.  IMPRESSION: 1. Plantar and Achilles calcaneal spurs. 2. Dorsal spurring at the talonavicular articulation. 3. No acute bony findings. If pain persists despite conservative therapy, MRI may be warranted for further characterization.   Electronically Signed   By: Sherryl Barters M.D.   On: 02/05/2013 09:08    EKG Interpretation    Date/Time:    Ventricular Rate:    PR Interval:    QRS Duration:   QT Interval:    QTC Calculation:   R Axis:     Text Interpretation:              MDM      Billy Fischer, MD 02/05/13 [phone]

## 2013-02-05 NOTE — ED Notes (Signed)
xl   l  Aso  applied

## 2013-02-05 NOTE — ED Notes (Signed)
Pt   Reports     L   Elbow  Pain  For  Quite  A  While    With      Some  Weakness  In the  Affected  Elbow        The  Pt          Reports       That   He  Also  Has  Some  Pain          Ankle    As  Well         The   Pt      denys  Any  Recent  Injury            He  States  As  Well  He  May  Have  A  Piece  Of metal in the  Affected  Elbow  From a  Long time  Ago

## 2013-02-19 ENCOUNTER — Emergency Department (HOSPITAL_COMMUNITY): Payer: BC Managed Care – PPO

## 2013-02-19 ENCOUNTER — Encounter (HOSPITAL_COMMUNITY): Payer: Self-pay | Admitting: Emergency Medicine

## 2013-02-19 ENCOUNTER — Emergency Department (HOSPITAL_COMMUNITY)
Admission: EM | Admit: 2013-02-19 | Discharge: 2013-02-19 | Disposition: A | Discharge status: Home / Self Care | Payer: BC Managed Care – PPO | Attending: Emergency Medicine | Admitting: Emergency Medicine

## 2013-02-19 DIAGNOSIS — R0602 Shortness of breath: Secondary | ICD-10-CM | POA: Insufficient documentation

## 2013-02-19 DIAGNOSIS — IMO0002 Reserved for concepts with insufficient information to code with codable children: Secondary | ICD-10-CM | POA: Insufficient documentation

## 2013-02-19 DIAGNOSIS — Z79899 Other long term (current) drug therapy: Secondary | ICD-10-CM | POA: Insufficient documentation

## 2013-02-19 DIAGNOSIS — Z791 Long term (current) use of non-steroidal anti-inflammatories (NSAID): Secondary | ICD-10-CM | POA: Insufficient documentation

## 2013-02-19 DIAGNOSIS — R079 Chest pain, unspecified: Secondary | ICD-10-CM | POA: Insufficient documentation

## 2013-02-19 LAB — BASIC METABOLIC PANEL
BUN: 14 mg/dL (ref 6–23)
CO2: 26 meq/L (ref 19–32)
Calcium: 8.7 mg/dL (ref 8.4–10.5)
Chloride: 104 mEq/L (ref 96–112)
Creatinine, Ser: 1.04 mg/dL (ref 0.50–1.35)
GFR, EST NON AFRICAN AMERICAN: 90 mL/min — AB (ref >90)
Glucose, Bld: 126 mg/dL — ABNORMAL HIGH (ref 70–99)
Potassium: 3.9 mEq/L (ref 3.7–5.3)
Sodium: 141 mEq/L (ref 137–147)

## 2013-02-19 LAB — CBC
HCT: 41.4 % (ref 39.0–52.0)
Hemoglobin: 14.4 g/dL (ref 13.0–17.0)
MCH: 30.1 pg (ref 26.0–34.0)
MCHC: 34.8 g/dL (ref 30.0–36.0)
MCV: 86.4 fL (ref 78.0–100.0)
PLATELETS: 268 10*3/uL (ref 150–400)
RBC: 4.79 MIL/uL (ref 4.22–5.81)
RDW: 13 % (ref 11.5–15.5)
WBC: 8 10*3/uL (ref 4.0–10.5)

## 2013-02-19 LAB — POCT I-STAT TROPONIN I: Troponin i, poc: 0.01 ng/mL (ref 0.00–0.08)

## 2013-02-19 MED ORDER — ASPIRIN 81 MG PO CHEW
324.0000 mg | CHEWABLE_TABLET | Freq: Once | ORAL | Status: AC
Start: 2013-02-19 — End: 2013-02-19
  Administered 2013-02-19: 324 mg via ORAL
  Filled 2013-02-19: qty 4

## 2013-02-19 MED ORDER — LORAZEPAM 1 MG PO TABS
1.0000 mg | ORAL_TABLET | Freq: Once | ORAL | Status: DC
  Filled 2013-02-19: qty 1

## 2013-02-19 NOTE — Discharge Instructions (Signed)
Chest Pain (Nonspecific) °It is often hard to give a specific diagnosis for the cause of chest pain. There is always a chance that your pain could be related to something serious, such as a heart attack or a blood clot in the lungs. You need to follow up with your caregiver for further evaluation. °CAUSES  °· Heartburn. °· Pneumonia or bronchitis. °· Anxiety or stress. °· Inflammation around your heart (pericarditis) or lung (pleuritis or pleurisy). °· A blood clot in the lung. °· A collapsed lung (pneumothorax). It can develop suddenly on its own (spontaneous pneumothorax) or from injury (trauma) to the chest. °· Shingles infection (herpes zoster virus). °The chest wall is composed of bones, muscles, and cartilage. Any of these can be the source of the pain. °· The bones can be bruised by injury. °· The muscles or cartilage can be strained by coughing or overwork. °· The cartilage can be affected by inflammation and become sore (costochondritis). °DIAGNOSIS  °Lab tests or other studies, such as X-rays, electrocardiography, stress testing, or cardiac imaging, may be needed to find the cause of your pain.  °TREATMENT  °· Treatment depends on what may be causing your chest pain. Treatment may include: °· Acid blockers for heartburn. °· Anti-inflammatory medicine. °· Pain medicine for inflammatory conditions. °· Antibiotics if an infection is present. °· You may be advised to change lifestyle habits. This includes stopping smoking and avoiding alcohol, caffeine, and chocolate. °· You may be advised to keep your head raised (elevated) when sleeping. This reduces the chance of acid going backward from your stomach into your esophagus. °· Most of the time, nonspecific chest pain will improve within 2 to 3 days with rest and mild pain medicine. °HOME CARE INSTRUCTIONS  °· If antibiotics were prescribed, take your antibiotics as directed. Finish them even if you start to feel better. °· For the next few days, avoid physical  activities that bring on chest pain. Continue physical activities as directed. °· Do not smoke. °· Avoid drinking alcohol. °· Only take over-the-counter or prescription medicine for pain, discomfort, or fever as directed by your caregiver. °· Follow your caregiver's suggestions for further testing if your chest pain does not go away. °· Keep any follow-up appointments you made. If you do not go to an appointment, you could develop lasting (chronic) problems with pain. If there is any problem keeping an appointment, you must call to reschedule. °SEEK MEDICAL CARE IF:  °· You think you are having problems from the medicine you are taking. Read your medicine instructions carefully. °· Your chest pain does not go away, even after treatment. °· You develop a rash with blisters on your chest. °SEEK IMMEDIATE MEDICAL CARE IF:  °· You have increased chest pain or pain that spreads to your arm, neck, jaw, back, or abdomen. °· You develop shortness of breath, an increasing cough, or you are coughing up blood. °· You have severe back or abdominal pain, feel nauseous, or vomit. °· You develop severe weakness, fainting, or chills. °· You have a fever. °THIS IS AN EMERGENCY. Do not wait to see if the pain will go away. Get medical help at once. Call your local emergency services (911 in [place] Do not drive yourself to the hospital. °MAKE SURE YOU:  °· Understand these instructions. °· Will watch your condition. °· Will get help right away if you are not doing well or get worse. °Document Released: 10/13/2004 Document Revised: 03/28/2011 Document Reviewed: 08/09/2007 °[employer]® Patient Information ©2014 [employer]   [employer]. ° °

## 2013-02-19 NOTE — ED Provider Notes (Signed)
Medical screening examination/treatment/procedure(s) were conducted as a shared visit with non-physician practitioner(s) and myself.  I personally evaluated the patient during the encounter.  EKG Interpretation    Date/Time:  Tuesday February 19 2013 17:25:46 EST Ventricular Rate:  88 PR Interval:  158 QRS Duration: 93 QT Interval:  348 QTC Calculation: 421 R Axis:   -53 Text Interpretation:  Sinus rhythm Left anterior fascicular block Abnormal R-wave progression, late transition Borderline T abnormalities, inferior leads No significant change since last tracing except t wave changes somewhat diminished since last tracing Confirmed by KNAPP  MD-J, JON ([id number]) on 02/19/2013 5:40:42 PM             Leota Jacobsen, MD 02/19/13 2113

## 2013-02-19 NOTE — ED Provider Notes (Signed)
Medical screening examination/treatment/procedure(s) were conducted as a shared visit with non-physician practitioner(s) and myself.  I personally evaluated the patient during the encounter.  EKG Interpretation    Date/Time:  Tuesday February 19 2013 17:25:46 EST Ventricular Rate:  88 PR Interval:  158 QRS Duration: 93 QT Interval:  348 QTC Calculation: 421 R Axis:   -53 Text Interpretation:  Sinus rhythm Left anterior fascicular block Abnormal R-wave progression, late transition Borderline T abnormalities, inferior leads No significant change since last tracing except t wave changes somewhat diminished since last tracing Confirmed by KNAPP  MD-J, JON ([id number]) on 02/19/2013 5:40:42 PM           patient here fluttering chest pain that last for seconds and not associated with dyspnea, diaphoresis. No recent fever or cough. No leg pain or swelling. Symptoms have resolved. Patient's EKG is unchanged from prior studies. Pain is atypical for angina. Patient given cardiology referral and he is stable for discharge.   Leota Jacobsen, MD 02/19/13 [phone]

## 2013-02-19 NOTE — ED Notes (Signed)
Patient reports eating around 1530 this afternoon came home and laid down. Patient states he began having left chest pain that radiated into his left arm. Patient also c/o dizziness at this time. Patient states he has had intermittent chest pain since.

## 2013-02-19 NOTE — ED Provider Notes (Signed)
CSN: [id number]     Arrival date & time 02/19/13  1720 History   First MD Initiated Contact with Patient 02/19/13 1749     Chief Complaint  Patient presents with  . Chest Pain  . Shortness of Breath   (Consider location/radiation/quality/duration/timing/severity/associated sxs/prior Treatment) HPI Comments: Patient presents to the emergency department with chief complaint of chest pain. He states the symptoms began today around 3:30. He states the pain radiated to his left arm. He denies any shortness of breath. Patient states the pain has been intermittent since earlier today. He has not tried taking anything to alleviate his symptoms. Nothing makes his symptoms better or worse. Additionally, patient states that he has been under a lot of stress lately. He states that his grandmother was recently placed on hospice. No cardiac risk factors.  The history is provided by the patient. No language interpreter was used.    Past Medical History  Diagnosis Date  . Medial meniscus tear 01/2012    right knee   Past Surgical History  Procedure Laterality Date  . Corneal transplant  12/2009  . Knee arthroscopy  02/20/2012    Procedure: ARTHROSCOPY KNEE;  Surgeon: Alta Corning, MD;  Location: [facility];  Service: Orthopedics;  Laterality: Right;  Chondroplasia patella femoral joint, medial meniscal debriedment   Family History  Problem Relation Age of Onset  . Asthma Mother   . Diabetes Father   . Cancer Father   . Osteoarthritis Sister   . Diabetes Sister    History  Substance Use Topics  . Smoking status: Never Smoker   . Smokeless tobacco: Never Used  . Alcohol Use: No    Review of Systems  All other systems reviewed and are negative.    Allergies  Review of patient's allergies indicates no known allergies.  Home Medications   Current Outpatient Rx  Name  Route  Sig  Dispense  Refill  . meloxicam (MOBIC) 7.5 MG tablet   Oral   Take 1 tablet (7.5 mg total) by  mouth daily.   30 tablet   1   . Multiple Vitamins-Minerals (MULTIVITAMIN GUMMIES ADULT) CHEW   Oral   Chew 1 capsule by mouth daily.         . naproxen sodium (ANAPROX) 220 MG tablet   Oral   Take 220 mg by mouth 2 (two) times daily with a meal.         . prednisoLONE acetate (PRED FORTE) 1 % ophthalmic suspension   Left Eye   Place 1 drop into the left eye daily.          BP 122/75  Pulse 87  Temp(Src) 98.5 F (36.9 C) (Oral)  Resp 20  Ht 5\' 8"  (1.727 m)  Wt 262 lb (118.842 kg)  BMI 39.85 kg/m2  SpO2 97% Physical Exam  Nursing note and vitals reviewed. Constitutional: He is oriented to person, place, and time. He appears well-developed and well-nourished.  HENT:  Head: Normocephalic and atraumatic.  Eyes: Conjunctivae and EOM are normal. Pupils are equal, round, and reactive to light. Right eye exhibits no discharge. Left eye exhibits no discharge. No scleral icterus.  Neck: Normal range of motion. Neck supple. No JVD present.  Cardiovascular: Normal rate, regular rhythm and normal heart sounds.  Exam reveals no gallop and no friction rub.   No murmur heard. Pulmonary/Chest: Effort normal and breath sounds normal. No respiratory distress. He has no wheezes. He has no rales. He exhibits no tenderness.  Abdominal: Soft. He exhibits no distension and no mass. There is no tenderness. There is no rebound and no guarding.  Musculoskeletal: Normal range of motion. He exhibits no edema and no tenderness.  Neurological: He is alert and oriented to person, place, and time.  Skin: Skin is warm and dry.  Psychiatric: He has a normal mood and affect. His behavior is normal. Judgment and thought content normal.    ED Course  Procedures (including critical care time) Labs Review Labs Reviewed  BASIC METABOLIC PANEL - Abnormal; Notable for the following:    Glucose, Bld 126 (*)    GFR calc non Af Amer 90 (*)    All other components within normal limits  CBC  POCT I-STAT  TROPONIN I   Imaging Review Dg Chest 2 View  02/19/2013   CLINICAL DATA:  Chest pain.  EXAM: CHEST  2 VIEW  COMPARISON:  10/04/2012 and 10/03/2010  FINDINGS: The heart size and mediastinal contours are within normal limits. Both lungs are clear. The visualized skeletal structures are unremarkable.  IMPRESSION: Normal chest.   Electronically Signed   By: Rozetta Nunnery M.D.   On: 02/19/2013 19:26    EKG Interpretation    Date/Time:  Tuesday February 19 2013 17:25:46 EST Ventricular Rate:  88 PR Interval:  158 QRS Duration: 93 QT Interval:  348 QTC Calculation: 421 R Axis:   -53 Text Interpretation:  Sinus rhythm Left anterior fascicular block Abnormal R-wave progression, late transition Borderline T abnormalities, inferior leads No significant change since last tracing except t wave changes somewhat diminished since last tracing Confirmed by KNAPP  MD-J, JON ([id number]) on 02/19/2013 5:40:42 PM            MDM   1. Chest pain     Patient chest pain. Labs, EKG, and chest x-ray are reassuring. The patient is pain-free now. Heart score is 0, PERC negative.  Labs are reassuring.  Patient seen by and discussed with Dr. Zenia Resides, who recommends discharge with cardiology follow-up.  Patient understands and agrees with the plan.  He is not in any apparent distress.  Non-toxic appearing. He is stable and ready for discharge.    Montine Circle, PA-C 02/19/13 2059

## 2013-02-19 NOTE — Progress Notes (Signed)
   CARE MANAGEMENT ED NOTE 02/19/2013  Patient:  WAYLIN, DORKO   Account Number:  [id number]  Date Initiated:  02/19/2013  Documentation initiated by:  Livia Snellen  Subjective/Objective Assessment:   Patient presents to Ed with complaint of chest pain     Subjective/Objective Assessment Detail:   Troponin negative.  Patient is pain free     Action/Plan:   Action/Plan Detail:   Anticipated DC Date:  02/19/2013     Status Recommendation to Physician:   Result of Recommendation:    Other ED [facility]  Other  PCP issues    Choice offered to / List presented to:            Status of service:  Completed, signed off  ED Comments:   ED Comments Detail:  Patient reports his pcp is Dr. [name] at [facility].  System updated.

## 2013-02-22 ENCOUNTER — Encounter: Payer: Self-pay | Admitting: Family Medicine

## 2013-02-22 ENCOUNTER — Ambulatory Visit (INDEPENDENT_AMBULATORY_CARE_PROVIDER_SITE_OTHER): Payer: BC Managed Care – PPO | Admitting: Family Medicine

## 2013-02-22 VITALS — BP 123/83 | Ht 68.0 in | Wt 255.0 lb

## 2013-02-22 DIAGNOSIS — M25529 Pain in unspecified elbow: Secondary | ICD-10-CM

## 2013-02-22 DIAGNOSIS — M25522 Pain in left elbow: Secondary | ICD-10-CM | POA: Insufficient documentation

## 2013-02-22 MED ORDER — MELOXICAM 7.5 MG PO TABS: 7.5000 mg | ORAL_TABLET | Freq: Every day | ORAL | Status: DC

## 2013-02-22 MED ORDER — NITROGLYCERIN 0.2 MG/HR TD PT24: MEDICATED_PATCH | TRANSDERMAL | Status: DC

## 2013-02-22 NOTE — Progress Notes (Signed)
   Subjective:    Patient ID: Derrick Scott, male    DOB: 1975/02/08, 38 y.o.   MRN: [id number]  HPI: Pt presents to [facility] with complaint of left elbow pain and reduced ROM / increased stiffness. Pt first noticed some biceps soreness about six months ago; he thought he might have had a biceps tear or sprain but the pain improved and he just worked through it. He otherwise denies specific / definite injury. Since then, he has had progressive reduced ROM. He states he can't extend or flex his elbow completely without increased pain. He sometimes has pain over the "point of his elbow" and has weakness especially with pushing down with his hand (straightening his elbow). Pt states he occasionally has some popping and rare locking / catching. He denies shoulder pain but does occasionally have wrist pain and hand pain in his third / fourth / fifth fingers. He also occasionally has fleeting numbness in his lateral elbow and some in his hand.  Of note, pt has been seen at urgent care for this issue about a month ago and had xrays taken which showed no fractures but some chronic tendinitis / inflammation / bony changes. Pt has taken ibuprofen, Mobic, icing, and stretching, but nothing has helped his symptoms.  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 In the past, pt has done extensive working out with weights and reports his max bench press has been in excess of 560 lbs.  Review of Systems: As above. Recovering from a URI-type illness with hoarseness but otherwise well.     Objective:   Physical Exam BP 123/83  Ht 5\' 8"  (1.727 m)  Wt 255 lb (115.667 kg)  BMI 38.78 kg/m2 Gen: well-appearing adult male in NAD; some hoarseness of voice MSK:   Normal appearance of bilateral elbows, upper arms and forearms  No frank joint deformity or frank joint effusions  Left elbow with reduced ROM in flexion and extension due to pain,  worse with extension  Pt unable to extend fully at left elbow secondary to pain / muscle stiffness  Pain elicited by movements described as soreness deep in the left elbow joint, itself  Exhibits tenderness on the left to palpation over bicipital insertion of forearm and over olecranon  No joint laxity on valgus or varus stress, though some increased pain worse with valgus stress  Right arm by contrast shows full ROM and displays no tenderness to palpation, throughout Neurovascular:  Strength 5/5 in UE bilaterally in grip, elbow flexion / extension, and shoulder flexion / extension  Strength 5/5 in external / internal rotation of shoulders bilaterally  Distal pulses intact / symmetric  Sensation grossly normal / intact in bilateral hands, forearms, upper arms  Xrays from urgent care reviewed, showing mild osteophyte formation at coranoid process and triceps ensthesopathy  Bedside ultrasound performed by Dr. Nori Riis - triceps tendon calcification over olecranon noted, consistent with xray findings - bicepital aponeurosis insertion and radial head not well-visualized due to pt musculature and inability to fully extend     Assessment & Plan:

## 2013-02-22 NOTE — Patient Instructions (Addendum)
Tricep kick backs ---15 reps twice a day--try 15 pounds Use nitro patch: Cut patch into one - fourth pieces. Place a one fourth piece of patch on  skin over affected area, changing to a new piece every 24 hours.  Contniue meloxicam  STRETCHES 20 a day See me ion a month

## 2013-02-22 NOTE — Progress Notes (Signed)
Patient ID: Derrick Scott, male   DOB: 1975/02/03, 38 y.o.   MRN: [id number] [place] Attending Note: I have seen and examined this patient. I have discussed this patient with the resident and reviewed the assessment and plan as documented above. I agree with the resident's findings and plan. Ultrasound reveals a lot of calcium deposits at the insertion on the olecranon. This is where he is point tender. She patient instructions for complete plan. I'll see him back in 3-4 weeks.

## 2013-03-04 ENCOUNTER — Ambulatory Visit (INDEPENDENT_AMBULATORY_CARE_PROVIDER_SITE_OTHER): Payer: BC Managed Care – PPO | Admitting: Sports Medicine

## 2013-03-04 ENCOUNTER — Encounter: Payer: Self-pay | Admitting: Sports Medicine

## 2013-03-04 VITALS — BP 116/81 | HR 82 | Ht 68.0 in | Wt 255.0 lb

## 2013-03-04 DIAGNOSIS — M25522 Pain in left elbow: Secondary | ICD-10-CM

## 2013-03-04 DIAGNOSIS — M25529 Pain in unspecified elbow: Secondary | ICD-10-CM

## 2013-03-04 NOTE — Patient Instructions (Addendum)
You have been scheduled for a MRI of your Left Elbow on 03/06/13 at 8 am. [facility] [address] [phone]  Please continue your exercises and to remain out of work for another week Please get your MRI done. We will call you with the results and decide when to see you again at that time Please come in as needed if your pain gets worse Please only use 1/4 of a nitro patch Please continue to do your exercises daily Please continue your meloxicam

## 2013-03-04 NOTE — Progress Notes (Signed)
Derrick Scott is a 38 y.o. male who presents to [facility] today for L arm pain  L arm pain: placing full nitro patch on arm and getting HA and tricep pain. Using patches daily. Elbow pain relieved by patches but then bicep would hurt worse. No wt lifting since last appt 10 days ago. ROM improved. Doing daily exercises. Also using meloxicam. Of note pain has been present in L elbow for ~81mo and reports occasional catching and popping in the elbow. Patient also reports occasional locking of the elbow. He is currently out of work due to his elbow pain.  PMH reviewed.  ROS as above otherwise neg Medications reviewed.  Exam:  BP 116/81  Pulse 82  Ht 5\' 8"  (1.727 m)  Wt 255 lb (115.667 kg)  BMI 38.78 kg/m2 Gen: Well NAD MSK: ROM of L arm limited in part by musculature and by stiffness. Flexion to about 120 and supination to 45 degrees. Point tenderness over the proximal end of the olacrenon process. No effusion.   Dg Chest 2 View  02/19/2013   CLINICAL DATA:  Chest pain.  EXAM: CHEST  2 VIEW  COMPARISON:  10/04/2012 and 10/03/2010  FINDINGS: The heart size and mediastinal contours are within normal limits. Both lungs are clear. The visualized skeletal structures are unremarkable.  IMPRESSION: Normal chest.   Electronically Signed   By: Rozetta Nunnery M.D.   On: 02/19/2013 19:26   Dg Elbow Complete Left  02/05/2013   CLINICAL DATA:  Chronic elbow pain and decreased range of motion without history of prior injury  EXAM: LEFT ELBOW - COMPLETE 3+ VIEW  COMPARISON:  Prior radiographs of the left forearm 05/05/2005  FINDINGS: No evidence of acute fracture, dislocation or joint effusion. Normal bony mineralization without lytic or blastic osseous lesions. Mild degenerative changes with small osteophyte formation at the coronoid process of the elbow and enthesopathy at the olecranon process in the region of the triceps tendon insertion. These changes are relatively mild.  IMPRESSION: 1. Mild degenerative  change as above. 2. No acute osseous or soft tissue abnormality.   Electronically Signed   By: Jacqulynn Cadet M.D.   On: 02/05/2013 09:01   Dg Ankle Complete Left  02/05/2013   CLINICAL DATA:  Left ankle pain medially.  EXAM: LEFT ANKLE COMPLETE - 3+ VIEW  COMPARISON:  None.  FINDINGS: There is dorsal spurring at the talonavicular articulation along with plantar and Achilles calcaneal spurs. The malleoli appear intact, as do the talar dome and tibial plafond. Base of the fifth metatarsal intact. No acute bony finding observed.  IMPRESSION: 1. Plantar and Achilles calcaneal spurs. 2. Dorsal spurring at the talonavicular articulation. 3. No acute bony findings. If pain persists despite conservative therapy, MRI may be warranted for further characterization.   Electronically Signed   By: Sherryl Barters M.D.   On: 02/05/2013 09:08     Assessment and Plan: 1) Elbow pain: etiology unclear but likely w/ some baseline arthritis but concern for possible loose body. Improving overall but overdosing on the nitro patches. - MRI L elbow to look for loose body or other pathology not noted on plain film and [place] - continue meloxicam - stay out of work 1 more wk - Cont exercises - pt instructed again to Mabel 1/4 patch at a time to help prevent HA.  - Decision on further mgt will be made after review of MRI results. Patient will followup in one week.

## 2013-03-06 ENCOUNTER — Other Ambulatory Visit: Payer: BC Managed Care – PPO

## 2013-03-10 ENCOUNTER — Ambulatory Visit
Admission: RE | Admit: 2013-03-10 | Discharge: 2013-03-10 | Disposition: A | Discharge status: Home / Self Care | Payer: BC Managed Care – PPO | Source: Ambulatory Visit | Attending: Sports Medicine | Admitting: Sports Medicine

## 2013-03-10 DIAGNOSIS — M25522 Pain in left elbow: Secondary | ICD-10-CM

## 2013-03-11 ENCOUNTER — Ambulatory Visit (INDEPENDENT_AMBULATORY_CARE_PROVIDER_SITE_OTHER): Payer: BC Managed Care – PPO | Admitting: Sports Medicine

## 2013-03-11 ENCOUNTER — Encounter: Payer: Self-pay | Admitting: Sports Medicine

## 2013-03-11 VITALS — BP 118/82 | Ht 68.0 in | Wt 260.0 lb

## 2013-03-11 DIAGNOSIS — M19029 Primary osteoarthritis, unspecified elbow: Secondary | ICD-10-CM

## 2013-03-11 NOTE — Patient Instructions (Signed)
You have been scheduled for an appointment with Dr. Mardelle Matte at [facility] on 03/13/13 at 10:15 am.  Their office is located at [facility] [place]

## 2013-03-11 NOTE — Progress Notes (Signed)
Patient ID: Derrick Scott, male   DOB: [birth year], 38 y.o.   MRN: [id number]  Patient comes in today to discuss MRI findings of his left elbow. Patient does in fact have a small cartilaginous loose body in the joint. It measures 10 mm x 7 mm x 2 mm. There are also small osteophytes at the tip of the olecranon and on the distal humerus as well as anteriorly on the capitellum and trochlea. Small elbow effusion and focal areas of grade 4 chondromalacia of the capitellum and radial head. Findings are consistent with elbow DJD with a small intra-articular loose body. Given the patient's mechanical symptoms and persistent pain I have recommended a referral to Dr. Carter Kitten for his input. Patient may benefit from arthroscopy to remove the loose body but the patient understands that the degenerative changes present in his elbow will remain. For the overall health of his elbow I have also recommended that he refrain from heavy weight lifting, especially those exercises that load the joint such as bench press or overhead press. Patient will see Dr. Mardelle Matte in 2 days and will remain out of work until his consultation at which point further treatment will be per Dr. Luanna Cole discretion.

## 2013-03-22 ENCOUNTER — Ambulatory Visit: Payer: BC Managed Care – PPO | Admitting: Family Medicine

## 2013-04-15 ENCOUNTER — Encounter: Payer: Self-pay | Admitting: Family Medicine

## 2013-04-15 ENCOUNTER — Ambulatory Visit (INDEPENDENT_AMBULATORY_CARE_PROVIDER_SITE_OTHER): Payer: BC Managed Care – PPO | Admitting: Family Medicine

## 2013-04-15 VITALS — BP 122/83 | HR 93 | Ht 68.0 in | Wt 260.0 lb

## 2013-04-15 DIAGNOSIS — M25522 Pain in left elbow: Secondary | ICD-10-CM

## 2013-04-15 DIAGNOSIS — M25529 Pain in unspecified elbow: Secondary | ICD-10-CM

## 2013-04-17 NOTE — Progress Notes (Signed)
   Subjective:    Patient ID: Charlestine Night II, male    DOB: 1975-03-20, 38 y.o.   MRN: [id number]  HPI . Had seen orthopedist she recommended arthroscopic surgery with loose body removal. He wants to put that off until July if he can secondary to restrictions associated with his new job. In the last 2 weeks she's had some increased pain that is sharp in nature. Notices this particularly when he picks something up that is heavy. Wants to know if he is doing damage to his elbow if it's okay to wait until July for surgery.    Review of Systems Has noted no swelling or erythema Of the elbow. He's had no forearm pain, no hand numbness.    Objective:   Physical Exam  GENERAL: Well-developed overweight male no acute distress ELBOW: Left. Still lacks full extension by about 20. Elbow is nontender to palpation. Distally he is neurovascularly intact. Forearm, wrist, and normal finger strength.      Assessment & Plan:  Elbow pain with known severe osteoarthritis and loose body. Are reviewed his MRI with him. It is probably safe to wait until July to get his surgery done. I would not recommend doing any weightlifting as we had discussed last time. I do think he is likely able to do his activities at his job although days symptoms include fairly having as and 50 pounds lifting, he is able to use both arms and his trunk.

## 2013-04-17 NOTE — Assessment & Plan Note (Signed)
MRI reveals arthritis, loose body.

## 2013-04-23 ENCOUNTER — Encounter: Payer: Self-pay | Admitting: Family Medicine

## 2013-04-23 ENCOUNTER — Ambulatory Visit (INDEPENDENT_AMBULATORY_CARE_PROVIDER_SITE_OTHER): Payer: BC Managed Care – PPO | Admitting: Family Medicine

## 2013-04-23 VITALS — BP 110/58 | HR 74 | Temp 97.8°F | Ht 68.0 in | Wt 268.0 lb

## 2013-04-23 DIAGNOSIS — R109 Unspecified abdominal pain: Secondary | ICD-10-CM

## 2013-04-23 LAB — POCT URINALYSIS DIPSTICK
Bilirubin, UA: NEGATIVE
Glucose, UA: NEGATIVE
Ketones, UA: NEGATIVE
Leukocytes, UA: NEGATIVE
NITRITE UA: NEGATIVE
PH UA: 6
Spec Grav, UA: >=1.03
UROBILINOGEN UA: 0.2

## 2013-04-23 LAB — POCT UA - MICROSCOPIC ONLY

## 2013-04-23 MED ORDER — HYDROCODONE-ACETAMINOPHEN 10-325 MG PO TABS: 1.0000 | ORAL_TABLET | Freq: Three times a day (TID) | ORAL | Status: DC | PRN

## 2013-04-23 MED ORDER — SILODOSIN 4 MG PO CAPS: 4.0000 mg | ORAL_CAPSULE | Freq: Every day | ORAL | Status: DC | PRN

## 2013-04-23 NOTE — Progress Notes (Signed)
   Subjective:    Patient ID: Derrick Scott, male    DOB: Jun 16, 1975, 38 y.o.   MRN: [id number]  HPI  38 year old male presenting for evaluation of flank pain. 6 days duration. Pain is worsening. Pain right sided and radiates towards the groin. Pain right now 8/10. Has not taken anything for pain.  No fever. No difficulty peeing. No nausea, vomiting, or abdominal pain. No constipation or diarrhea.  PMH - 6 mm x 5 mm kidney stone diagnosed by CT 2011   Review of Systems See HPI    Objective:   Physical Exam BP 110/58  Pulse 74  Temp(Src) 97.8 F (36.6 C) (Oral)  Ht 5\' 8"  (1.727 m)  Wt 268 lb (121.564 kg)  BMI 40.76 kg/m2 Gen: obese AAM, very pleasant but uncomfortabl appearing Back: mild right CVA tenderness, no left tenderness Abd: soft,NDNT, no guarding       Assessment & Plan:

## 2013-04-23 NOTE — Assessment & Plan Note (Signed)
A:  Patient history consistent with ureterolithiasis P:  - Plan for conservative management with maximizing hydration, using rapaflo off lable for stone passage, and given narcotic medication to take as needed for pain - Given strainer to catch stone for evaluation - Followup in 3 days if not improved; followup thereafter for evaluation of urine and metabolic panel to look for strategy to prevent recurrent stones

## 2013-04-23 NOTE — Patient Instructions (Signed)
Dear Mr. Heindl,   Thank you for coming to clinic today. Please read below regarding the issues that we discussed.   1. Flank pain - given her history of kidney stone, this is the most likely cause of the problem. At this time I do not see a reason for a CT scan to diagnose it, but think we should treat the symptoms.   - Please start taking the medication called Rapaflow to the kidney stone pass  - Take 2 Aleve this morning and before you go to work to help the pain, he may use the hydrocodone as needed but not while at work  - Drink lots of water   - Use a strainer to try to catch the stone and if you do bring the stone to the clinic for evaluation  Please follow up in clinic in 3 days if not improving. Please call earlier if you have any questions or concerns.   Sincerely,   Dr. Maricela Bo

## 2013-05-09 ENCOUNTER — Encounter (HOSPITAL_COMMUNITY): Payer: Self-pay | Admitting: Emergency Medicine

## 2013-05-09 ENCOUNTER — Emergency Department (HOSPITAL_COMMUNITY)
Admission: EM | Admit: 2013-05-09 | Discharge: 2013-05-09 | Disposition: A | Discharge status: Home / Self Care | Payer: BC Managed Care – PPO | Attending: Emergency Medicine | Admitting: Emergency Medicine

## 2013-05-09 DIAGNOSIS — H1010 Acute atopic conjunctivitis, unspecified eye: Secondary | ICD-10-CM

## 2013-05-09 DIAGNOSIS — B070 Plantar wart: Secondary | ICD-10-CM | POA: Insufficient documentation

## 2013-05-09 DIAGNOSIS — Z947 Corneal transplant status: Secondary | ICD-10-CM | POA: Insufficient documentation

## 2013-05-09 DIAGNOSIS — IMO0002 Reserved for concepts with insufficient information to code with codable children: Secondary | ICD-10-CM | POA: Insufficient documentation

## 2013-05-09 DIAGNOSIS — Z8739 Personal history of other diseases of the musculoskeletal system and connective tissue: Secondary | ICD-10-CM | POA: Insufficient documentation

## 2013-05-09 DIAGNOSIS — H1045 Other chronic allergic conjunctivitis: Secondary | ICD-10-CM | POA: Insufficient documentation

## 2013-05-09 MED ORDER — TETRACAINE HCL 0.5 % OP SOLN
1.0000 [drp] | Freq: Once | OPHTHALMIC | Status: DC
  Filled 2013-05-09: qty 2

## 2013-05-09 MED ORDER — NAPHAZOLINE-PHENIRAMINE 0.025-0.3 % OP SOLN: 1.0000 [drp] | Freq: Four times a day (QID) | OPHTHALMIC | Status: DC | PRN

## 2013-05-09 MED ORDER — SALICYLIC ACID 17 % EX GEL: Freq: Every day | CUTANEOUS | Status: DC

## 2013-05-09 MED ORDER — FLUORESCEIN SODIUM 1 MG OP STRP
2.0000 | ORAL_STRIP | Freq: Once | OPHTHALMIC | Status: DC
  Filled 2013-05-09: qty 2

## 2013-05-09 NOTE — Discharge Instructions (Signed)
Warts  Warts are common. They are caused by a virus. Warts are most common in older children. There may be a single wart or there may be many warts. The size and location varies. They can be spread by scratching the wart and then scratching normal skin. Most warts will disappear over many months to a couple years. HOME CARE  Follow home care directions as told by your doctor.  Keep all doctor visits as told. Warts may return. GET HELP RIGHT AWAY IF: The treated skin becomes red, puffy (swollen), or painful. MAKE SURE YOU:  Understand these instructions.  Will watch your condition.  Will get help right away if you are not doing well or get worse. Document Released: 05/06/2010 Document Revised: 03/28/2011 Document Reviewed: 05/06/2010 [facility] Patient Information 2014 [place], [place].  Allergic Conjunctivitis A thin membrane (conjunctiva) covers the eyeball and underside of the eyelids. Allergic conjunctivitis happens when the thin membrane gets irritated from things like animal dander, pollen, perfumes, or smoke (allergens). The membrane may become puffy (swollen) and red. Small bumps may form on the inside of the eyelids. Your eyes may get teary, itchy, or burn. It cannot be passed to another person (contagious).  HOME CARE  Wash your hands before and after applying medicated drops or creams.  Do not touch the drop or cream tube to your eye or eyelids.  Do not use your soft contacts. Throw them away. Use a new pair once recovery is complete.  Do not use your hard contacts. They need to be washed (sterilized) thoroughly after recovery is complete.  Put a cold cloth to your eye(s) if you have itching and burning. GET HELP RIGHT AWAY IF:   You are not feeling better in 2 to 3 days after treatment.  Your lids are sticky or stick together.  Fluid comes from the eye(s).  You become sensitive to light.  You have a temperature by mouth above 102 F (38.9 C).  You have pain in and  around the eye(s).  You start to have vision problems. MAKE SURE YOU:   Understand these instructions.  Will watch your condition.  Will get help right away if you are not doing well or get worse. Document Released: 06/23/2009 Document Revised: 03/28/2011 Document Reviewed: 06/23/2009 [facility] Patient Information 2014 [place].

## 2013-05-09 NOTE — ED Notes (Signed)
Pt appears to have a wart like spot on left foot, no diabetes, this heals and comes back.  Pt complains of eye irriation

## 2013-05-09 NOTE — ED Provider Notes (Signed)
CSN: [id number]     Arrival date & time 05/09/13  1114 History  This chart was scribed for non-physician practitioner working with Cleatrice Burke, by Allena Earing ED Scribe. This patient was seen in TR04C/TR04C and the patient's care was started at 12:09 PM.     Chief Complaint  Patient presents with  . Foot Pain  . Eye Problem    Patient is a 38 y.o. male presenting with eye problem. The history is provided by the patient. No language interpreter was used.  Eye Problem Associated symptoms: itching and redness    HPI Comments: Derrick Scott is a 38 y.o. male who presents to the Emergency Department complaining of "allergies" that worsened yesterday, he reports that his eyes were red bilaterally yesterday and were burning. He tried OTC drops for his eyes without relief. No blurry or double vision. No photophobia. Pt reports that he had a corneal transplant in his left eye in 2011 at [facility]. He also reports that his throat is sore.  Pt also reports that he has a "spot" on his left foot. He has associated pain. Walking makes the pain worse and he works 12 hour shifts on his feet. The "spot" has been there for "awhile". The area has come and gone.   Past Medical History  Diagnosis Date  . Medial meniscus tear 01/2012    right knee   Past Surgical History  Procedure Laterality Date  . Corneal transplant  12/2009  . Knee arthroscopy  02/20/2012    Procedure: ARTHROSCOPY KNEE;  Surgeon: Alta Corning, MD;  Location: [facility];  Service: Orthopedics;  Laterality: Right;  Chondroplasia patella femoral joint, medial meniscal debriedment   Family History  Problem Relation Age of Onset  . Asthma Mother   . Diabetes Father   . Cancer Father   . Osteoarthritis Sister   . Diabetes Sister    History  Substance Use Topics  . Smoking status: Never Smoker   . Smokeless tobacco: Never Used  . Alcohol Use: No    Review of Systems  Eyes: Positive for redness and itching.   Skin:       Spot on bottom of left foot  All other systems reviewed and are negative.     Allergies  Review of patient's allergies indicates no known allergies.  Home Medications   Prior to Admission medications   Medication Sig Start Date End Date Taking? Authorizing Provider  HYDROcodone-acetaminophen (NORCO) 10-325 MG per tablet Take 1 tablet by mouth every 8 (eight) hours as needed for severe pain (Don't take at work.). 04/23/13   Angelica Ran, MD  Multiple Vitamins-Minerals (MULTIVITAMIN GUMMIES ADULT) CHEW Chew 1 capsule by mouth daily.    Historical Provider, MD  nitroGLYCERIN (NITRODUR - DOSED IN MG/24 HR) 0.2 mg/hr patch Apply 1/4 patch to affected area.  Change patch every 24 hours. 02/22/13   Dickie La, MD  prednisoLONE acetate (PRED FORTE) 1 % ophthalmic suspension Place 1 drop into the left eye daily.    Historical Provider, MD  silodosin (RAPAFLO) 4 MG CAPS capsule Take 1 capsule (4 mg total) by mouth daily as needed (Take if you have a kidney stone). 04/23/13   Angelica Ran, MD   BP 121/67  Pulse 87  Temp(Src) 97.9 F (36.6 C) (Oral)  Resp 20  Wt 260 lb (117.935 kg)  SpO2 94% Physical Exam  Nursing note and vitals reviewed. Constitutional: He is oriented to person, place, and time. He  appears well-developed and well-nourished. No distress.  HENT:  Head: Normocephalic and atraumatic.  Right Ear: External ear normal.  Left Ear: External ear normal.  Nose: Nose normal.  Eyes: EOM and lids are normal. Pupils are equal, round, and reactive to light. Lids are everted and swept, no foreign bodies found. Right conjunctiva is injected. Left conjunctiva is injected.  Slit lamp exam:      The right eye shows no corneal abrasion, no corneal flare, no corneal ulcer, no foreign body, no hyphema, no hypopyon and no fluorescein uptake.       The left eye shows no corneal abrasion, no corneal flare, no corneal ulcer, no foreign body, no hyphema, no hypopyon and no  fluorescein uptake.  Mild corneal injection in eyes bilaterally. No fluorescein dye uptake bilaterally.  Scarring from corneal transplant seen in left eye.  No photophobia or pain with consensual light reflex.   Neck: Normal range of motion. No tracheal deviation present.  Cardiovascular: Normal rate, regular rhythm and normal heart sounds.   Pulmonary/Chest: Effort normal and breath sounds normal. No stridor.  Abdominal: Soft. He exhibits no distension. There is no tenderness.  Musculoskeletal: Normal range of motion.  Neurological: He is alert and oriented to person, place, and time.  Skin: Skin is warm and dry. He is not diaphoretic.  Plantar wart to left foot. No signs of overlying infection.   Psychiatric: He has a normal mood and affect. His behavior is normal.    ED Course  Procedures (including critical care time)  DIAGNOSTIC STUDIES: Oxygen Saturation is 94% on RA, adequate by my interpretation.    COORDINATION OF CARE:   12:15 PM-Discussed treatment plan which includes drops for his eyes and medication to remove wart on left foot with pt at bedside and pt agreed to plan.   Labs Review Labs Reviewed - No data to display  Imaging Review No results found.   EKG Interpretation None      MDM   Final diagnoses:  Allergic conjunctivitis  Plantar wart    Patient presents to ED with sx of allergic conjunctivitis. No concern for bacterial conjunctivitis. Sx are bilateral and mild. No visual disturbance. Will give Naphazoline drops for symptomatic tx. F/u with PCP. Discussed case with Dr. Audie Pinto who agrees with plan. Return instructions given. Patient also with plantar wart on left foot. Will give rx for salicylic acid. Again patient will f/u with pcp for this. Vital signs stable for discharge. Patient / Family / Caregiver informed of clinical course, understand medical decision-making process, and agree with plan.   I personally performed the services described in this  documentation, which was scribed in my presence. The recorded information has been reviewed and is accurate.    Elwyn Lade, PA-C 05/09/13 1322

## 2013-05-11 NOTE — ED Provider Notes (Signed)
Medical screening examination/treatment/procedure(s) were performed by non-physician practitioner and as supervising physician I was immediately available for consultation/collaboration.    Davan Nawabi L Mikahla Wisor, MD 05/11/13 1001 

## 2013-05-19 ENCOUNTER — Emergency Department (HOSPITAL_COMMUNITY)
Admission: EM | Admit: 2013-05-19 | Discharge: 2013-05-19 | Disposition: A | Discharge status: Home / Self Care | Payer: BC Managed Care – PPO | Attending: Emergency Medicine | Admitting: Emergency Medicine

## 2013-05-19 ENCOUNTER — Emergency Department (HOSPITAL_COMMUNITY): Payer: BC Managed Care – PPO

## 2013-05-19 ENCOUNTER — Encounter (HOSPITAL_COMMUNITY): Payer: Self-pay | Admitting: Emergency Medicine

## 2013-05-19 DIAGNOSIS — Z87828 Personal history of other (healed) physical injury and trauma: Secondary | ICD-10-CM | POA: Insufficient documentation

## 2013-05-19 DIAGNOSIS — M546 Pain in thoracic spine: Secondary | ICD-10-CM | POA: Insufficient documentation

## 2013-05-19 DIAGNOSIS — IMO0002 Reserved for concepts with insufficient information to code with codable children: Secondary | ICD-10-CM | POA: Insufficient documentation

## 2013-05-19 DIAGNOSIS — Z87442 Personal history of urinary calculi: Secondary | ICD-10-CM | POA: Insufficient documentation

## 2013-05-19 DIAGNOSIS — Z79899 Other long term (current) drug therapy: Secondary | ICD-10-CM | POA: Insufficient documentation

## 2013-05-19 DIAGNOSIS — M549 Dorsalgia, unspecified: Secondary | ICD-10-CM

## 2013-05-19 LAB — COMPREHENSIVE METABOLIC PANEL
ALT: 22 U/L (ref 0–53)
AST: 20 U/L (ref 0–37)
Albumin: 3.7 g/dL (ref 3.5–5.2)
Alkaline Phosphatase: 69 U/L (ref 39–117)
BUN: 13 mg/dL (ref 6–23)
CALCIUM: 8.9 mg/dL (ref 8.4–10.5)
CHLORIDE: 105 meq/L (ref 96–112)
CO2: 24 meq/L (ref 19–32)
CREATININE: 0.97 mg/dL (ref 0.50–1.35)
GFR calc Af Amer: >90 mL/min (ref >90)
GLUCOSE: 107 mg/dL — AB (ref 70–99)
Potassium: 4.2 mEq/L (ref 3.7–5.3)
SODIUM: 141 meq/L (ref 137–147)
Total Bilirubin: 0.3 mg/dL (ref 0.3–1.2)
Total Protein: 7.8 g/dL (ref 6.0–8.3)

## 2013-05-19 LAB — CBC WITH DIFFERENTIAL/PLATELET
Basophils Absolute: 0 10*3/uL (ref 0.0–0.1)
Basophils Relative: 0 % (ref 0–1)
Eosinophils Absolute: 0.1 10*3/uL (ref 0.0–0.7)
Eosinophils Relative: 2 % (ref 0–5)
HEMATOCRIT: 43.6 % (ref 39.0–52.0)
Hemoglobin: 15.3 g/dL (ref 13.0–17.0)
LYMPHS ABS: 2.5 10*3/uL (ref 0.7–4.0)
LYMPHS PCT: 45 % (ref 12–46)
MCH: 29.9 pg (ref 26.0–34.0)
MCHC: 35.1 g/dL (ref 30.0–36.0)
MCV: 85.2 fL (ref 78.0–100.0)
MONO ABS: 0.3 10*3/uL (ref 0.1–1.0)
Monocytes Relative: 5 % (ref 3–12)
NEUTROS ABS: 2.7 10*3/uL (ref 1.7–7.7)
Neutrophils Relative %: 48 % (ref 43–77)
Platelets: 242 10*3/uL (ref 150–400)
RBC: 5.12 MIL/uL (ref 4.22–5.81)
RDW: 13.5 % (ref 11.5–15.5)
WBC: 5.6 10*3/uL (ref 4.0–10.5)

## 2013-05-19 LAB — URINALYSIS, ROUTINE W REFLEX MICROSCOPIC
BILIRUBIN URINE: NEGATIVE
Glucose, UA: NEGATIVE mg/dL
Ketones, ur: NEGATIVE mg/dL
Leukocytes, UA: NEGATIVE
Nitrite: NEGATIVE
PROTEIN: NEGATIVE mg/dL
SPECIFIC GRAVITY, URINE: 1.026 (ref 1.005–1.030)
UROBILINOGEN UA: 0.2 mg/dL (ref 0.0–1.0)
pH: 5.5 (ref 5.0–8.0)

## 2013-05-19 LAB — URINE MICROSCOPIC-ADD ON

## 2013-05-19 MED ORDER — KETOROLAC TROMETHAMINE 60 MG/2ML IM SOLN
60.0000 mg | Freq: Once | INTRAMUSCULAR | Status: AC
  Administered 2013-05-19: 60 mg via INTRAMUSCULAR
  Filled 2013-05-19: qty 2

## 2013-05-19 NOTE — Discharge Instructions (Signed)
Back Pain, Adult  Back pain is very common. The pain often gets better over time. The cause of back pain is usually not dangerous. Most people can learn to manage their back pain on their own.   HOME CARE   · Stay active. Start with short walks on flat ground if you can. Try to walk farther each day.  · Do not sit, drive, or stand in one place for more than 30 minutes. Do not stay in bed.  · Do not avoid exercise or work. Activity can help your back heal faster.  · Be careful when you bend or lift an object. Bend at your knees, keep the object close to you, and do not twist.  · Sleep on a firm mattress. Lie on your side, and bend your knees. If you lie on your back, put a pillow under your knees.  · Only take medicines as told by your doctor.  · Put ice on the injured area.  · Put ice in a plastic bag.  · Place a towel between your skin and the bag.  · Leave the ice on for 15-20 minutes, 03-04 times a day for the first 2 to 3 days. After that, you can switch between ice and heat packs.  · Ask your doctor about back exercises or massage.  · Avoid feeling anxious or stressed. Find good ways to deal with stress, such as exercise.  GET HELP RIGHT AWAY IF:   · Your pain does not go away with rest or medicine.  · Your pain does not go away in 1 week.  · You have new problems.  · You do not feel well.  · The pain spreads into your legs.  · You cannot control when you poop (bowel movement) or pee (urinate).  · Your arms or legs feel weak or lose feeling (numbness).  · You feel sick to your stomach (nauseous) or throw up (vomit).  · You have belly (abdominal) pain.  · You feel like you may pass out (faint).  MAKE SURE YOU:   · Understand these instructions.  · Will watch your condition.  · Will get help right away if you are not doing well or get worse.  Document Released: 06/22/2007 Document Revised: 03/28/2011 Document Reviewed: 05/24/2010  [employer]® Patient Information ©2014 [employer].

## 2013-05-19 NOTE — ED Notes (Signed)
Pt has not arrived to room.

## 2013-05-19 NOTE — ED Notes (Signed)
Pt reports R flank pain for a few weeks, he went to doctor and they told him to strain his urine and take flomax. He has had no relief of the flank pain and has not seen anything in his strainer.

## 2013-05-19 NOTE — ED Notes (Signed)
Pt reports he was seen at his doctor and given flomax for kidney stones to BOTH kidneys, states the right kidney hurts the most, 10/10. States he has been straining his urine and has not seen a stone pass, states they did not scan him to see if it was stones or the size of them.

## 2013-05-19 NOTE — ED Notes (Signed)
Arrived to room

## 2013-05-19 NOTE — ED Provider Notes (Signed)
CSN: [id number]     Arrival date & time 05/19/13  1340 History   First MD Initiated Contact with Patient 05/19/13 1608     Chief Complaint  Patient presents with  . Flank Pain   4:24 PM Patient not in room  (Consider location/radiation/quality/duration/timing/severity/associated sxs/prior Treatment) HPI 38 year old male with right mid back pain to the right flank. He has a history of kidney stones and feel it like it may be consistent with that. It Does increase with movement. It is somewhat sharp in nature. There is no known history of trauma. He has not had any urinary tract infection symptoms or neurological symptoms. Past Medical History  Diagnosis Date  . Medial meniscus tear 01/2012    right knee   Past Surgical History  Procedure Laterality Date  . Corneal transplant  12/2009  . Knee arthroscopy  02/20/2012    Procedure: ARTHROSCOPY KNEE;  Surgeon: Alta Corning, MD;  Location: [facility];  Service: Orthopedics;  Laterality: Right;  Chondroplasia patella femoral joint, medial meniscal debriedment   Family History  Problem Relation Age of Onset  . Asthma Mother   . Diabetes Father   . Cancer Father   . Osteoarthritis Sister   . Diabetes Sister    History  Substance Use Topics  . Smoking status: Never Smoker   . Smokeless tobacco: Never Used  . Alcohol Use: No    Review of Systems  All other systems reviewed and are negative.     Allergies  Review of patient's allergies indicates no known allergies.  Home Medications   Prior to Admission medications   Medication Sig Start Date End Date Taking? Authorizing Provider  HYDROcodone-acetaminophen (NORCO) 10-325 MG per tablet Take 1 tablet by mouth every 8 (eight) hours as needed for severe pain (Don't take at work.). 04/23/13   Angelica Ran, MD  Multiple Vitamins-Minerals (MULTIVITAMIN GUMMIES ADULT) CHEW Chew 1 capsule by mouth daily.    Historical Provider, MD  naphazoline-pheniramine (NAPHCON-A)  0.025-0.3 % ophthalmic solution Place 1 drop into both eyes 4 (four) times daily as needed for irritation. 05/09/13   Elwyn Lade, PA-C  nitroGLYCERIN (NITRODUR - DOSED IN MG/24 HR) 0.2 mg/hr patch Apply 1/4 patch to affected area.  Change patch every 24 hours. 02/22/13   Dickie La, MD  prednisoLONE acetate (PRED FORTE) 1 % ophthalmic suspension Place 1 drop into the left eye daily.    Historical Provider, MD  salicylic acid 17 % gel Apply topically daily. 05/09/13   Elwyn Lade, PA-C  silodosin (RAPAFLO) 4 MG CAPS capsule Take 1 capsule (4 mg total) by mouth daily as needed (Take if you have a kidney stone). 04/23/13   Angelica Ran, MD   BP 115/57  Pulse 82  Temp(Src) 98.3 F (36.8 C) (Oral)  Resp 17  Ht 5\' 8"  (1.727 m)  Wt 267 lb 1.6 oz (121.156 kg)  BMI 40.62 kg/m2  SpO2 98% Physical Exam  Nursing note and vitals reviewed. Constitutional: He is oriented to person, place, and time. He appears well-developed and well-nourished.  HENT:  Head: Normocephalic and atraumatic.  Right Ear: External ear normal.  Left Ear: External ear normal.  Nose: Nose normal.  Mouth/Throat: Oropharynx is clear and moist.  Eyes: Conjunctivae and EOM are normal. Pupils are equal, round, and reactive to light.  Neck: Normal range of motion. Neck supple.  Cardiovascular: Normal rate, regular rhythm, normal heart sounds and intact distal pulses.   Pulmonary/Chest: Effort normal  and breath sounds normal. No respiratory distress. He has no wheezes. He exhibits no tenderness.  Abdominal: Soft. Bowel sounds are normal. He exhibits no distension and no mass. There is no tenderness. There is no guarding.  Musculoskeletal: Normal range of motion.  Neurological: He is alert and oriented to person, place, and time. He has normal reflexes. He exhibits normal muscle tone. Coordination normal.  Skin: Skin is warm and dry.  Psychiatric: He has a normal mood and affect. His behavior is normal. Judgment and  thought content normal.    ED Course  Procedures (including critical care time) Labs Review Labs Reviewed  COMPREHENSIVE METABOLIC PANEL - Abnormal; Notable for the following:    Glucose, Bld 107 (*)    All other components within normal limits  URINALYSIS, ROUTINE W REFLEX MICROSCOPIC - Abnormal; Notable for the following:    Hgb urine dipstick TRACE (*)    All other components within normal limits  CBC WITH DIFFERENTIAL  URINE MICROSCOPIC-ADD ON    Imaging Review Ct Abdomen Pelvis Wo Contrast  05/19/2013   CLINICAL DATA:  Right flank pain for several weeks.  EXAM: CT ABDOMEN AND PELVIS WITHOUT CONTRAST  TECHNIQUE: Multidetector CT imaging of the abdomen and pelvis was performed following the standard protocol without intravenous contrast.  COMPARISON:  09/29/2012  FINDINGS: Clear lung bases.  Heart is normal in size.  Fatty infiltration of the liver.  Liver otherwise unremarkable.  Normal spleen, gallbladder and pancreas. No bile duct dilation. 14 mm mass at the crux of the left adrenal gland, nonspecific but likely an adenoma. It is stable. Normal left adrenal gland.  No renal masses or stones. No hydronephrosis. Normal ureters. Bladder is unremarkable.  No pathologically enlarged lymph nodes. There are no abnormal fluid collections.  Bowel is unremarkable.  Normal appendix is visualized.  No significant bony abnormality.  IMPRESSION: 1. No acute findings. 2. No renal or ureteral stones.  No obstructive uropathy. 3. Hepatic steatosis.  Small right adrenal mass, likely an adenoma. 4. No other abnormalities.   Electronically Signed   By: Lajean Manes M.D.   On: 05/19/2013 18:22     EKG Interpretation None      MDM   Final diagnoses:  Back pain    38 year old male with back pain and trace hemoglobin in urine. CT reveals no evidence of kidney stones. He is given Toradol here and had some improvement in her pain. We placed on nonsteroidals and advised regarding return precautions and  need for followup He voices understanding.  Shaune Pollack, MD 05/19/13 [id number]

## 2013-05-19 NOTE — ED Notes (Signed)
Patient transported to X-ray 

## 2013-05-22 ENCOUNTER — Ambulatory Visit (INDEPENDENT_AMBULATORY_CARE_PROVIDER_SITE_OTHER): Payer: BC Managed Care – PPO | Admitting: Family Medicine

## 2013-05-22 ENCOUNTER — Encounter: Payer: Self-pay | Admitting: Family Medicine

## 2013-05-22 VITALS — BP 131/90 | HR 80 | Temp 98.4°F | Ht 68.0 in | Wt 265.2 lb

## 2013-05-22 DIAGNOSIS — Z23 Encounter for immunization: Secondary | ICD-10-CM

## 2013-05-22 DIAGNOSIS — D3501 Benign neoplasm of right adrenal gland: Secondary | ICD-10-CM | POA: Insufficient documentation

## 2013-05-22 DIAGNOSIS — E278 Other specified disorders of adrenal gland: Secondary | ICD-10-CM

## 2013-05-22 DIAGNOSIS — E279 Disorder of adrenal gland, unspecified: Secondary | ICD-10-CM | POA: Diagnosis not present

## 2013-05-22 DIAGNOSIS — D3500 Benign neoplasm of unspecified adrenal gland: Secondary | ICD-10-CM | POA: Insufficient documentation

## 2013-05-24 NOTE — Progress Notes (Signed)
   Subjective:    Patient ID: Derrick Scott, male    DOB: 1976-01-14, 38 y.o.   MRN: [id number]  HPI Recently seen at the emergency department for what he thought was kidney stone pain. He's been having right flank pain off and on for the last week or so. They did a CT scan which did not show any kidney stone but did show a mass on his adrenal gland. He's here for followup for that. Pain continues and is intermittent. It does not radiate. Does not seem to be associated with bowel movement, activity, certain smells will skeletal movements.   Review of Systems No blood in his urine, no change in urinary or bowel habits. No unusual weight loss. No fever, sweats, chills.    Objective:   Physical Exam Vital signs are reviewed GENERAL: Well-developed overweight male no acute distress in BACK: Nontender to percussion over the CVA angles. Area he points to is over the lumbar musculature. Forward flexion and hyperextension increases his pain. Lower strandy strength is 5 out of 5. IMAGING: Reviewed his CT scan with him whenever the images in detail.       Assessment & Plan:  #1. Flank pain is from late muscle skeletal pain in the gave him some exercises to do for that. #2. Adrenal mass, likely incidental him up. He is very concerned about this so we will send him to surgery for further evaluation.

## 2013-06-07 ENCOUNTER — Encounter (INDEPENDENT_AMBULATORY_CARE_PROVIDER_SITE_OTHER): Payer: Self-pay | Admitting: General Surgery

## 2013-06-07 ENCOUNTER — Ambulatory Visit (INDEPENDENT_AMBULATORY_CARE_PROVIDER_SITE_OTHER): Payer: BC Managed Care – PPO | Admitting: General Surgery

## 2013-06-07 VITALS — BP 146/90 | HR 80 | Temp 98.4°F | Resp 16 | Ht 68.0 in | Wt 263.6 lb

## 2013-06-07 DIAGNOSIS — E278 Other specified disorders of adrenal gland: Secondary | ICD-10-CM

## 2013-06-07 DIAGNOSIS — E279 Disorder of adrenal gland, unspecified: Secondary | ICD-10-CM

## 2013-06-07 DIAGNOSIS — E669 Obesity, unspecified: Secondary | ICD-10-CM

## 2013-06-07 NOTE — Addendum Note (Signed)
Addended by: Jacinto Reap on: 06/07/2013 05:14 PM   Modules accepted: Orders

## 2013-06-07 NOTE — Patient Instructions (Signed)
You have a 1.4 cm right adrenal mass.  Most likely this is a nonfunctioning adenoma, and nothing will need to be done except do an x-ray from time to time   We need to do some urine and blood test to make sure that it is not overactive and producing too many chemicals.  We will schedule urine and blood test  He will be given an appointment to return to see Dr. Dalbert Batman in 3-4 weeks.

## 2013-06-07 NOTE — Progress Notes (Signed)
Patient ID: Derrick Scott, male   DOB: [birth year], 38 y.o.   MRN: [id number]  Chief Complaint  Patient presents with  . Mass    new pt- eval adrenal mass    HPI Derrick Scott is a 38 y.o. male.  He is referred to me by Dr. Royetta Car, none family practice, for evaluation of an incidentally found 1.4 cm right adrenal mass  The patient was having bilateral flank pain, somewhat more the right than on the left and had a CT scan. He says sometimes the pain will radiate down to his anterolateral right thigh. This is all intermittent. CT scan was performed. This shows a 1.4 cm right adrenal mass, thought to be a likely adenoma. There was no renal or ureteral stones or hydronephrosis. No other abnormalities.  The patient has a history of obesity and obstructive sleep apnea. Denies history of hypertension, headaches, hirsutism, or diabetes. His maximum weight was 285. He states that he's down to 263 today after some intentional weight loss.  Family history is negative for pituitary, thyroid, pancreatic or adrenal disease. No family history of sudden death during anesthesia.  HPI  Past Medical History  Diagnosis Date  . Medial meniscus tear 01/2012    right knee    Past Surgical History  Procedure Laterality Date  . Corneal transplant  12/2009  . Knee arthroscopy  02/20/2012    Procedure: ARTHROSCOPY KNEE;  Surgeon: Alta Corning, MD;  Location: [facility];  Service: Orthopedics;  Laterality: Right;  Chondroplasia patella femoral joint, medial meniscal debriedment    Family History  Problem Relation Age of Onset  . Asthma Mother   . Diabetes Father   . Cancer Father     prostate  . Osteoarthritis Sister   . Diabetes Sister     Social History History  Substance Use Topics  . Smoking status: Never Smoker   . Smokeless tobacco: Never Used  . Alcohol Use: No    No Known Allergies  Current Outpatient Prescriptions  Medication Sig Dispense Refill  .  Multiple Vitamins-Minerals (MULTIVITAMIN GUMMIES ADULT) CHEW Chew 1 capsule by mouth daily.      . nitroGLYCERIN (NITRODUR - DOSED IN MG/24 HR) 0.2 mg/hr patch Apply 1/4 patch to affected area.  Change patch every 24 hours.  30 patch  1  . prednisoLONE acetate (PRED FORTE) 1 % ophthalmic suspension Place 1 drop into the left eye daily.       No current facility-administered medications for this visit.    Review of Systems Review of Systems  Constitutional: Positive for unexpected weight change. Negative for fever and chills.  HENT: Negative for congestion, hearing loss, sore throat, trouble swallowing and voice change.   Eyes: Negative for visual disturbance.  Respiratory: Negative for cough and wheezing.   Cardiovascular: Negative for chest pain, palpitations and leg swelling.  Gastrointestinal: Negative for nausea, vomiting, abdominal pain, diarrhea, constipation, blood in stool, abdominal distention, anal bleeding and rectal pain.  Genitourinary: Negative for hematuria and difficulty urinating.  Musculoskeletal: Positive for back pain. Negative for arthralgias.  Skin: Negative for rash and wound.  Neurological: Negative for seizures, syncope, weakness and headaches.  Hematological: Negative for adenopathy. Does not bruise/bleed easily.  Psychiatric/Behavioral: Negative for confusion.    Blood pressure 146/90, pulse 80, temperature 98.4 F (36.9 C), temperature source Oral, resp. rate 16, height 5\' 8"  (1.727 m), weight 263 lb 9.6 oz (119.568 kg).  Physical Exam Physical Exam  Constitutional: He is oriented  to person, place, and time. He appears well-developed and well-nourished. No distress.  BMI 40. Central obesity. Abdominal striae.  borderline moon facies. No hirsutism.  HENT:  Head: Normocephalic.  Nose: Nose normal.  Mouth/Throat: No oropharyngeal exudate.  Eyes: Conjunctivae and EOM are normal. Pupils are equal, round, and reactive to light. Right eye exhibits no discharge.  Left eye exhibits no discharge. No scleral icterus.  Neck: Normal range of motion. Neck supple. No JVD present. No tracheal deviation present. No thyromegaly present.  Cardiovascular: Normal rate, regular rhythm, normal heart sounds and intact distal pulses.   No murmur heard. Pulmonary/Chest: Effort normal and breath sounds normal. No stridor. No respiratory distress. He has no wheezes. He has no rales. He exhibits no tenderness.  Abdominal: Soft. Bowel sounds are normal. He exhibits no distension and no mass. There is no tenderness. There is no rebound and no guarding.  Some abdominal straining and stretch marks. Not hyperpigmented.  Musculoskeletal: Normal range of motion. He exhibits no edema and no tenderness.  Lymphadenopathy:    He has no cervical adenopathy.  Neurological: He is alert and oriented to person, place, and time. He has normal reflexes. Coordination normal.  Skin: Skin is warm and dry. No rash noted. He is not diaphoretic. No erythema. No pallor.  Psychiatric: He has a normal mood and affect. His behavior is normal. Judgment and thought content normal.    Data Reviewed Brief office note from family practice Center. CT scan. Recent CBC and seem at, which revealed normal renal function and normal electrolytes.  Assessment    1.4 cm smoothly rounded right adrenal mass. Differential diagnosis includes nonfunctioning adrenal adenoma, most likely. Also a functioning adenoma. Pheochromocytoma, less likely. Adrenocortical carcinoma less likely. Ganglioneuroma or metastatic cancer extremely unlikely.  Obesity. Pattern is suggestive but not classic for Cushing's syndrome  Obstructive sleep apnea     Plan    I discussed the CT scan findings and the differential diagnosis with him.  I told him that the adrenal mass did not meet size criteria for surgical intervention  Schedule 24 hour urine cortisol, urine for catecholamines, metanephrines, VMA.  Schedule for plasma  cortisol, estradiol, testosterone, dehydroepiandrosterone, androstenedione, renin and aldosterone, 17-OH steroids, 17-ketosteroids   Followup in 3-4 weeks.       Adin Hector 06/07/2013, 3:52 PM

## 2013-06-11 ENCOUNTER — Telehealth (INDEPENDENT_AMBULATORY_CARE_PROVIDER_SITE_OTHER): Payer: Self-pay | Admitting: General Surgery

## 2013-06-11 NOTE — Telephone Encounter (Signed)
Faxed written order for 17- hydroxycorticosteroid and 17-ketosteroid to [facility] to be drawn along with the others.  [facility] would not allow these two orders to be placed.  Confirmation was received on these faxed orders.

## 2013-06-12 LAB — TESTOSTERONE: Testosterone: 170 ng/dL — ABNORMAL LOW (ref 300–890)

## 2013-06-12 LAB — CORTISOL: Cortisol, Plasma: 4.3 ug/dL

## 2013-06-12 LAB — ESTRADIOL: Estradiol: 30 pg/mL

## 2013-06-13 ENCOUNTER — Ambulatory Visit (INDEPENDENT_AMBULATORY_CARE_PROVIDER_SITE_OTHER): Payer: BC Managed Care – PPO | Admitting: Sports Medicine

## 2013-06-13 ENCOUNTER — Encounter: Payer: Self-pay | Admitting: Sports Medicine

## 2013-06-13 VITALS — BP 160/95 | Ht 68.0 in | Wt 260.0 lb

## 2013-06-13 DIAGNOSIS — M25569 Pain in unspecified knee: Secondary | ICD-10-CM

## 2013-06-13 DIAGNOSIS — M25561 Pain in right knee: Secondary | ICD-10-CM

## 2013-06-13 MED ORDER — MELOXICAM 15 MG PO TABS: 15.0000 mg | ORAL_TABLET | Freq: Every day | ORAL | Status: DC

## 2013-06-13 NOTE — Progress Notes (Signed)
Patient ID: Derrick Scott II, male   DOB: February 16, 1975, 38 y.o.   MRN: [id number] 38 year old male with a complaint of right knee pain. Onset over the past one month or so. Intermittent episodes of swelling. Pain on the lateral aspect of his knee. Worse with stooping or squatting. No radiation of pain. Swelling improves with rest. Denies any specific inciting injury. Pain with twisting as well.  Pertinent past medical history: Right medial meniscal tear with arthroscopy one year ago, obesity  Social history: Nonsmoker, no alcohol  Review of systems: As per history of present illness otherwise all systems negative  Examination: BP 160/95  Ht 5\' 8"  (1.727 m)  Wt 260 lb (117.935 kg)  BMI 39.54 kg/m61 Obese 38 year old [place] American male awake alert oriented in no acute distress HEENT: Extraocular muscles intact Respiratory: Normal effort Right Knee: Normal to inspection with no erythema or effusion or obvious bony abnormalities. Palpation with lateral joint line tenderness. ROM normal in flexion and extension and lower leg rotation. Ligaments with solid consistent endpoints including ACL, PCL, LCL, MCL. Positive for [place] test right knee Non painful patellar compression. Patellar and quadriceps tendons unremarkable. Hamstring and quadriceps strength is normal.   MSK [place]:  No obvious effusion noted in the suprapatellar pouch, normal-appearing patellar tendon, slightly narrowed joint space in the medial side, no obvious meniscal tear or pathology noted on the lateral joint line.

## 2013-06-13 NOTE — Assessment & Plan Note (Signed)
He presents with recurrent right knee pain today. I'm concerned that he may have lateral meniscal pathology however, the ultrasound is normal today. He does have recurrent episodes of swelling. Will put in a body helix compression sleeve as well as daily MOBIC. He'll followup in 4 weeks' time. If symptoms do not improve will consider referral to orthopedics for further evaluation.

## 2013-06-14 ENCOUNTER — Encounter: Payer: Self-pay | Admitting: *Deleted

## 2013-06-14 ENCOUNTER — Ambulatory Visit: Payer: BC Managed Care – PPO | Admitting: Family Medicine

## 2013-06-14 NOTE — Patient Instructions (Signed)
[facility] DR GRAVES MON JUN 8TH [address] [employer] [place] [phone]

## 2013-06-15 LAB — ANDROSTENEDIONE: ANDROSTENEDIONE: 40 ng/dL (ref 40–190)

## 2013-06-15 LAB — DHEA: DHEA: 224 ng/dL (ref 61–1636)

## 2013-06-15 LAB — METANEPHRINES, URINE, 24 HOUR
METANEPH TOTAL UR: 28 ug/(24.h) — AB (ref 115–695)
METANEPHRINES UR: 11 ug/(24.h) — AB (ref 36–190)
Normetanephrine, 24H Ur: 17 mcg/24 h — ABNORMAL LOW (ref 35–482)

## 2013-06-18 LAB — CORTISOL, URINE, 24 HOUR
Cortisol (Ur), Free: 22.1 mcg/24 h (ref 4.0–50.0)
RESULTS RECEIVED: 2.1 g/(24.h) (ref 0.63–2.50)

## 2013-06-24 ENCOUNTER — Telehealth (INDEPENDENT_AMBULATORY_CARE_PROVIDER_SITE_OTHER): Payer: Self-pay

## 2013-06-24 NOTE — Telephone Encounter (Signed)
Late entry Patient is aware Dr. Dalbert Batman is LOA for 2 weeks . He will be called after lab results are viewed by DR. Dalbert Batman.

## 2013-06-25 ENCOUNTER — Encounter: Payer: Self-pay | Admitting: *Deleted

## 2013-06-30 NOTE — Telephone Encounter (Signed)
Tell patient that all tests so far are normal.  I am not sure all are back, but most are. This means that the adrenal gland is not making too much of any hormone and can likely be watched without surgery.    Advise followup appt. With me and possible endocrine consult.    Derrick Scott. Dalbert Batman, M.D., FACS

## 2013-07-01 ENCOUNTER — Telehealth (INDEPENDENT_AMBULATORY_CARE_PROVIDER_SITE_OTHER): Payer: Self-pay

## 2013-07-01 NOTE — Telephone Encounter (Signed)
Patient has appointment for 07/16/13@ 830 Patient is aware

## 2013-07-01 NOTE — Telephone Encounter (Signed)
Needs follow up appt. With me.  Derrick Scott

## 2013-07-01 NOTE — Telephone Encounter (Signed)
Informed patient all the test this far is negative, and the adrenal gland is not making too much of any hormones . At this we will monitor and surgery is not needed at this time. Patient verbalizes understanding

## 2013-07-16 ENCOUNTER — Other Ambulatory Visit (INDEPENDENT_AMBULATORY_CARE_PROVIDER_SITE_OTHER): Payer: Self-pay

## 2013-07-16 ENCOUNTER — Encounter (INDEPENDENT_AMBULATORY_CARE_PROVIDER_SITE_OTHER): Payer: BC Managed Care – PPO | Admitting: General Surgery

## 2013-08-15 ENCOUNTER — Encounter (INDEPENDENT_AMBULATORY_CARE_PROVIDER_SITE_OTHER): Payer: Self-pay | Admitting: General Surgery

## 2013-08-22 ENCOUNTER — Encounter (INDEPENDENT_AMBULATORY_CARE_PROVIDER_SITE_OTHER): Payer: Self-pay | Admitting: General Surgery

## 2013-08-30 ENCOUNTER — Ambulatory Visit (INDEPENDENT_AMBULATORY_CARE_PROVIDER_SITE_OTHER): Payer: BC Managed Care – PPO | Admitting: Family Medicine

## 2013-08-30 VITALS — BP 146/93 | HR 98

## 2013-08-30 DIAGNOSIS — R42 Dizziness and giddiness: Secondary | ICD-10-CM | POA: Insufficient documentation

## 2013-08-30 DIAGNOSIS — H8110 Benign paroxysmal vertigo, unspecified ear: Secondary | ICD-10-CM | POA: Insufficient documentation

## 2013-08-30 MED ORDER — MECLIZINE HCL 32 MG PO TABS: 32.0000 mg | ORAL_TABLET | Freq: Three times a day (TID) | ORAL | Status: DC | PRN

## 2013-08-30 MED ORDER — MECLIZINE HCL 25 MG PO TABS: 25.0000 mg | ORAL_TABLET | Freq: Three times a day (TID) | ORAL | Status: DC | PRN

## 2013-08-30 MED ORDER — DIAZEPAM 10 MG PO TABS: 10.0000 mg | ORAL_TABLET | Freq: Every evening | ORAL | Status: DC | PRN

## 2013-08-30 NOTE — Addendum Note (Signed)
Addended by: Johny Shears on: 08/30/2013 04:25 PM   Modules accepted: Orders, Medications

## 2013-08-30 NOTE — Assessment & Plan Note (Addendum)
Pt description very typical despite negative Dix-Hallpike.  Will tx with home epley's exercises, meclizine PRN during the day and valium PRN at night for this for severe Sx preventing sleep.  F/U in one week to see how doing.  Prognosis and exercises discussed with pt who understands this could take 2-3 weeks for resolution.  As well, he works with heavy machinery and recommended he see how he does prior to returning to work next week.  As well, no driving or operating machinery on the valium.   As well BP orthostatics were borderline, if Sx continue, could consider working this up.

## 2013-08-30 NOTE — Patient Instructions (Signed)
Benign Positional Vertigo Vertigo means you feel like you or your surroundings are moving when they are not. Benign positional vertigo is the most common form of vertigo. Benign means that the cause of your condition is not serious. Benign positional vertigo is more common in older adults. CAUSES  Benign positional vertigo is the result of an upset in the labyrinth system. This is an area in the middle ear that helps control your balance. This may be caused by a viral infection, head injury, or repetitive motion. However, often no specific cause is found. SYMPTOMS  Symptoms of benign positional vertigo occur when you move your head or eyes in different directions. Some of the symptoms may include:  Loss of balance and falls.  Vomiting.  Blurred vision.  Dizziness.  Nausea.  Involuntary eye movements (nystagmus). DIAGNOSIS  Benign positional vertigo is usually diagnosed by physical exam. If the specific cause of your benign positional vertigo is unknown, your caregiver may perform imaging tests, such as magnetic resonance imaging (MRI) or computed tomography (CT). TREATMENT  Your caregiver may recommend movements or procedures to correct the benign positional vertigo. Medicines such as meclizine, benzodiazepines, and medicines for nausea may be used to treat your symptoms. In rare cases, if your symptoms are caused by certain conditions that affect the inner ear, you may need surgery. HOME CARE INSTRUCTIONS   Follow your caregiver's instructions.  Move slowly. Do not make sudden body or head movements.  Avoid driving.  Avoid operating heavy machinery.  Avoid performing any tasks that would be dangerous to you or others during a vertigo episode.  Drink enough fluids to keep your urine clear or pale yellow. SEEK IMMEDIATE MEDICAL CARE IF:   You develop problems with walking, weakness, numbness, or using your arms, hands, or legs.  You have difficulty speaking.  You develop  severe headaches.  Your nausea or vomiting continues or gets worse.  You develop visual changes.  Your family or friends notice any behavioral changes.  Your condition gets worse.  You have a fever.  You develop a stiff neck or sensitivity to light. MAKE SURE YOU:   Understand these instructions.  Will watch your condition.  Will get help right away if you are not doing well or get worse. Document Released: 10/11/2005 Document Revised: 03/28/2011 Document Reviewed: 09/23/2010 ExitCare Patient Information 2015 [employer]. This information is not intended to replace advice given to you by your health care provider. Make sure you discuss any questions you have with your health care provider.    

## 2013-08-30 NOTE — Progress Notes (Signed)
Derrick Scott is a 38 y.o. male who presents today for the sensation of vertigo.  Pt states he has never had this before, and it started about 3 days ago when he was laying in bed.  Pt moved his head to the R side when he started to have the sensation of spinning around him that lasted for about 45-60 seconds.  He has had several episodes since that point, which have decreased in time to the most recent today, lasting about 10-15 seconds.  He endorses nausea with this and states he has had non bloody/ non bilious vomiting x 2 episodes with the nausea.  Denies HA, blurred vision, CP, SOB, decreased PO intake.  Denies tinnitus or focal neurologic deficits.   Past Medical History  Diagnosis Date  . Medial meniscus tear 01/2012    right knee    History  Smoking status  . Never Smoker   Smokeless tobacco  . Never Used    Family History  Problem Relation Age of Onset  . Asthma Mother   . Diabetes Father   . Cancer Father     prostate  . Osteoarthritis Sister   . Diabetes Sister     Current Outpatient Prescriptions on File Prior to Visit  Medication Sig Dispense Refill  . meloxicam (MOBIC) 15 MG tablet Take 1 tablet (15 mg total) by mouth daily.  30 tablet  2  . Multiple Vitamins-Minerals (MULTIVITAMIN GUMMIES ADULT) CHEW Chew 1 capsule by mouth daily.      . nitroGLYCERIN (NITRODUR - DOSED IN MG/24 HR) 0.2 mg/hr patch Apply 1/4 patch to affected area.  Change patch every 24 hours.  30 patch  1  . prednisoLONE acetate (PRED FORTE) 1 % ophthalmic suspension Place 1 drop into the left eye daily.       No current facility-administered medications on file prior to visit.    ROS: Per HPI.  All other systems reviewed and are negative.   Physical Exam Filed Vitals:   08/30/13 1422  BP: 146/93  Pulse: 98   Orthostatics: Laying 164/78, sitting 146/84, stand 146/93  Physical Examination: General appearance - alert, well appearing, and in no distress Eyes - pupils equal and  reactive, extraocular eye movements intact, No nystagmus induced  Ears - bilateral TM's and external ear canals normal, Dix-Hallpike Maneuver negative  Nose - normal and patent, no erythema, discharge or polyps Mouth - mucous membranes moist, pharynx normal without lesions Neck - supple, no significant adenopathy Chest - clear to auscultation, no wheezes, rales or rhonchi, symmetric air entry Heart - normal rate and regular rhythm Neuro: CN 2-12 intact, no focal deficits     Chemistry      Component Value Date/Time   NA 141 05/19/2013 1450   K 4.2 05/19/2013 1450   CL 105 05/19/2013 1450   CO2 24 05/19/2013 1450   BUN 13 05/19/2013 1450   CREATININE 0.97 05/19/2013 1450   CREATININE 1.26 10/24/2012 1142      Component Value Date/Time   CALCIUM 8.9 05/19/2013 1450   ALKPHOS 69 05/19/2013 1450   AST 20 05/19/2013 1450   ALT 22 05/19/2013 1450   BILITOT 0.3 05/19/2013 1450      Lab Results  Component Value Date   WBC 5.6 05/19/2013   HGB 15.3 05/19/2013   HCT 43.6 05/19/2013   MCV 85.2 05/19/2013   PLT 242 05/19/2013   No results found for this basename: TSH   No results found for this basename: HGBA1C

## 2013-09-26 ENCOUNTER — Emergency Department (INDEPENDENT_AMBULATORY_CARE_PROVIDER_SITE_OTHER)
Admission: EM | Admit: 2013-09-26 | Discharge: 2013-09-26 | Disposition: A | Discharge status: Home / Self Care | Payer: BC Managed Care – PPO | Source: Home / Self Care | Attending: Family Medicine | Admitting: Family Medicine

## 2013-09-26 ENCOUNTER — Encounter (HOSPITAL_COMMUNITY): Payer: Self-pay | Admitting: Emergency Medicine

## 2013-09-26 DIAGNOSIS — B9789 Other viral agents as the cause of diseases classified elsewhere: Secondary | ICD-10-CM

## 2013-09-26 DIAGNOSIS — B349 Viral infection, unspecified: Secondary | ICD-10-CM

## 2013-09-26 MED ORDER — ONDANSETRON HCL 4 MG PO TABS: 4.0000 mg | ORAL_TABLET | Freq: Four times a day (QID) | ORAL | Status: DC

## 2013-09-26 MED ORDER — IPRATROPIUM BROMIDE 0.06 % NA SOLN: 2.0000 | Freq: Four times a day (QID) | NASAL | Status: DC

## 2013-09-26 MED ORDER — ONDANSETRON 4 MG PO TBDP
8.0000 mg | ORAL_TABLET | Freq: Once | ORAL | Status: AC
  Administered 2013-09-26: 8 mg via ORAL

## 2013-09-26 MED ORDER — ONDANSETRON 4 MG PO TBDP
ORAL_TABLET | ORAL | Status: AC
  Filled 2013-09-26: qty 2

## 2013-09-26 NOTE — ED Provider Notes (Signed)
CSN: [id number]     Arrival date & time 09/26/13  1647 History   First MD Initiated Contact with Patient 09/26/13 1751     Chief Complaint  Patient presents with  . URI   (Consider location/radiation/quality/duration/timing/severity/associated sxs/prior Treatment) Patient is a 38 y.o. male presenting with URI. The history is provided by the patient.  URI Presenting symptoms: congestion, cough and rhinorrhea   Presenting symptoms: no fever   Severity:  Mild Onset quality:  Gradual Duration:  2 days Chronicity:  New Associated symptoms comment:  Nausea, diarrhea Risk factors: no sick contacts     Past Medical History  Diagnosis Date  . Medial meniscus tear 01/2012    right knee   Past Surgical History  Procedure Laterality Date  . Corneal transplant  12/2009  . Knee arthroscopy  02/20/2012    Procedure: ARTHROSCOPY KNEE;  Surgeon: Alta Corning, MD;  Location: [facility];  Service: Orthopedics;  Laterality: Right;  Chondroplasia patella femoral joint, medial meniscal debriedment   Family History  Problem Relation Age of Onset  . Asthma Mother   . Diabetes Father   . Cancer Father     prostate  . Osteoarthritis Sister   . Diabetes Sister    History  Substance Use Topics  . Smoking status: Never Smoker   . Smokeless tobacco: Never Used  . Alcohol Use: No    Review of Systems  Constitutional: Negative.  Negative for fever.  HENT: Positive for congestion, postnasal drip and rhinorrhea.   Respiratory: Positive for cough.   Gastrointestinal: Positive for nausea and diarrhea. Negative for vomiting.    Allergies  Review of patient's allergies indicates no known allergies.  Home Medications   Prior to Admission medications   Medication Sig Start Date End Date Taking? Authorizing Provider  diazepam (VALIUM) 10 MG tablet Take 1 tablet (10 mg total) by mouth at bedtime as needed for sleep. 08/30/13   Bryan R Hess, DO  ipratropium (ATROVENT) 0.06 % nasal  spray Place 2 sprays into both nostrils 4 (four) times daily. 09/26/13   Billy Fischer, MD  meclizine (ANTIVERT) 25 MG tablet Take 1 tablet (25 mg total) by mouth 3 (three) times daily as needed for dizziness. 08/30/13   Tamela Oddi Hess, DO  meloxicam (MOBIC) 15 MG tablet Take 1 tablet (15 mg total) by mouth daily. 06/13/13   Hennie Duos, MD  Multiple Vitamins-Minerals (MULTIVITAMIN GUMMIES ADULT) CHEW Chew 1 capsule by mouth daily.    Historical Provider, MD  nitroGLYCERIN (NITRODUR - DOSED IN MG/24 HR) 0.2 mg/hr patch Apply 1/4 patch to affected area.  Change patch every 24 hours. 02/22/13   Dickie La, MD  ondansetron (ZOFRAN) 4 MG tablet Take 1 tablet (4 mg total) by mouth every 6 (six) hours. Prn n/v. 09/26/13   Billy Fischer, MD  prednisoLONE acetate (PRED FORTE) 1 % ophthalmic suspension Place 1 drop into the left eye daily.    Historical Provider, MD   BP 126/83  Pulse 90  Temp(Src) 99.7 F (37.6 C) (Oral)  Resp 18  SpO2 98% Physical Exam  Nursing note and vitals reviewed. Constitutional: He is oriented to person, place, and time. He appears well-developed and well-nourished. No distress.  HENT:  Right Ear: External ear normal.  Left Ear: External ear normal.  Nose: Mucosal edema and rhinorrhea present.  Mouth/Throat: Oropharynx is clear and moist.  Neck: Normal range of motion. Neck supple.  Cardiovascular: Normal heart sounds and intact distal  pulses.   Pulmonary/Chest: Effort normal and breath sounds normal.  Abdominal: Soft. Bowel sounds are normal. He exhibits no distension and no mass. There is no tenderness. There is no rebound and no guarding.  Neurological: He is alert and oriented to person, place, and time.  Skin: Skin is warm and dry.    ED Course  Procedures (including critical care time) Labs Review Labs Reviewed - No data to display  Imaging Review No results found.   MDM   1. Systemic viral illness        Billy Fischer, MD 09/26/13 [phone]

## 2013-09-26 NOTE — Discharge Instructions (Signed)
Clear liquid , bland diet tonight as tolerated, advance on fri as improved, use medicine as needed, imodium for diarrhea. return or see your doctor if any problems.

## 2013-09-26 NOTE — ED Notes (Signed)
C/o  Body aches.  Sore throat and swelling.  Headache.  Nonproductive cough. Diarrhea.  Nasal congestion.  No relief with otc meds.  Denies n/v.    Symptoms present x 2 days.

## 2013-09-27 NOTE — ED Notes (Signed)
Called and asked for extension of RTW note, authorized per Dr Juventino Slovak

## 2013-10-23 ENCOUNTER — Encounter: Payer: Self-pay | Admitting: Family Medicine

## 2013-10-23 ENCOUNTER — Ambulatory Visit (INDEPENDENT_AMBULATORY_CARE_PROVIDER_SITE_OTHER): Payer: BC Managed Care – PPO | Admitting: Family Medicine

## 2013-10-23 VITALS — BP 103/70 | HR 105 | Temp 98.4°F | Wt 264.0 lb

## 2013-10-23 DIAGNOSIS — N529 Male erectile dysfunction, unspecified: Secondary | ICD-10-CM

## 2013-10-23 DIAGNOSIS — N508 Other specified disorders of male genital organs: Secondary | ICD-10-CM | POA: Diagnosis not present

## 2013-10-23 DIAGNOSIS — N50812 Left testicular pain: Secondary | ICD-10-CM

## 2013-10-23 MED ORDER — SILDENAFIL CITRATE 50 MG PO TABS: 50.0000 mg | ORAL_TABLET | Freq: Every day | ORAL | Status: DC | PRN

## 2013-10-23 NOTE — Progress Notes (Signed)
Patient ID: Derrick Scott, male   DOB: 17-Jul-1975, 38 y.o.   MRN: [id number]  HPI:  Pt presents for a same day appointment to discuss left testicular pain of one month's duration.  Not getting worse, but not staying the same. No prior injury. 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 The pain is occasional, sharp pain, then goes away. Has had problems getting an erection for about 2 weeks. Does not smoke or drink. No dysuria, hematuria, or any urinary symptoms. Has not identified any masses or tender areas on his testicle.  Has appointment with Dr. Gaynelle Arabian on Oct 27 for evaluation. Has never had anything like this before. No hx of STD's. Sexually active with one male partner in the last 9 years. No fevers. Does have erections in the morning upon waking up. Prior to the erectile dysfunction several weeks ago, pt lost his job due to post-surgical issues and transportation problems. Has been stressed from this.  ROS: See HPI  [place]: previously healthy, previously used nitro patch for MSK issue on elbow, no longer using this  PHYSICAL EXAM: BP 103/70  Pulse 105  Temp(Src) 98.4 F (36.9 C) (Oral)  Wt 264 lb (119.75 kg) Gen: NAD HEENT: NCAT Neuro: grossly nonfocal, speech normal GU: penis circumcised, normal in appearance. No meatal discharge or lesions. Bilateral testicles nontender to palpation without masses. Some varicocele present bilaterally. Scrotum normal in appearance without lesions, redness, swelling, or skin breakdown. Exam chaperoned by Dr. Mingo Amber.   ASSESSMENT/PLAN:  1. Testicular pain: etiology not clear, likely nonspecific pain. Possibly referred pain from gas. No obvious findings on exam to suggest scrotal disease, infectious cause, or torsion. Await urology evaluation. Pt counseled on reasons to seek care sooner.  2. Erectile dysfunction: likely psychogenic with increased stress. No evidence of DM, HTN, other organic causes of erectile dysfunction. Will rx  short term supply of viagra. Suspect that resolution of sexual dysfunction will help calm his stress significantly and thus this will not be a long-term medication. Discussed importance of NOT using nitroglycerin patch while on viagra. Will also await urology evaluation.  Precepted with Dr. Mingo Amber who also examined patient and agrees with this plan.   FOLLOW UP: F/u in 1-2 months with PCP for general medical care Keep urology appointment.  [place]. Ardelia Mems, [place]

## 2013-10-23 NOTE — Patient Instructions (Signed)
It was nice to meet you today!  For erectile dysfunction: -take viagra 1 hour before sex to help with erections. -no nitroglycerin while using this medicine -keep urology appointment  For testicular pain: -go to ER if becomes red, swollen, very painful, fevers, nausea, etc.  Schedule appointment with Dr. Nori Riis in 1-2 months to follow up.  Be well, Dr. Ardelia Mems

## 2013-12-04 ENCOUNTER — Emergency Department (HOSPITAL_COMMUNITY)
Admission: EM | Admit: 2013-12-04 | Discharge: 2013-12-04 | Disposition: A | Discharge status: Home / Self Care | Payer: BC Managed Care – PPO | Attending: Emergency Medicine | Admitting: Emergency Medicine

## 2013-12-04 ENCOUNTER — Emergency Department (HOSPITAL_COMMUNITY): Payer: BC Managed Care – PPO

## 2013-12-04 ENCOUNTER — Encounter (HOSPITAL_COMMUNITY): Payer: Self-pay | Admitting: Emergency Medicine

## 2013-12-04 DIAGNOSIS — R52 Pain, unspecified: Secondary | ICD-10-CM

## 2013-12-04 DIAGNOSIS — Z7952 Long term (current) use of systemic steroids: Secondary | ICD-10-CM | POA: Insufficient documentation

## 2013-12-04 DIAGNOSIS — R071 Chest pain on breathing: Secondary | ICD-10-CM | POA: Insufficient documentation

## 2013-12-04 DIAGNOSIS — R000 Tachycardia, unspecified: Secondary | ICD-10-CM | POA: Insufficient documentation

## 2013-12-04 DIAGNOSIS — Z79899 Other long term (current) drug therapy: Secondary | ICD-10-CM | POA: Insufficient documentation

## 2013-12-04 DIAGNOSIS — Z8701 Personal history of pneumonia (recurrent): Secondary | ICD-10-CM | POA: Insufficient documentation

## 2013-12-04 DIAGNOSIS — R0789 Other chest pain: Secondary | ICD-10-CM

## 2013-12-04 HISTORY — DX: Pneumonia, unspecified organism: J18.9

## 2013-12-04 LAB — D-DIMER, QUANTITATIVE (NOT AT ARMC)

## 2013-12-04 MED ORDER — MELOXICAM 15 MG PO TABS: 15.0000 mg | ORAL_TABLET | Freq: Every day | ORAL | Status: DC

## 2013-12-04 NOTE — ED Notes (Signed)
Pt presents with L posterior rib pain radiating around rib cage, onset yesterday while at work. Worse with movement. Pt denies fever, SHOB,n/v/d. NAD

## 2013-12-04 NOTE — ED Provider Notes (Signed)
CSN: [id number]     Arrival date & time 12/04/13  1914 History  This chart was scribed for non-physician practitioner, Antonietta Breach, PA-C working with Janice Norrie, MD by Einar Pheasant, ED scribe. This patient was seen in room [place] and the patient's care was started at 9:18 PM.    Chief Complaint  Patient presents with  . Rib pain    The history is provided by the patient. No language interpreter was used.   HPI Comments: Derrick Scott is a 38 y.o. male with a hx of knee arthroscopy in July (the procedure was 1 hour long with no really immobilization of his right knee) presents to the Emergency Department complaining of sudden onset worsening left posterior rib pain with onset yesterday. Pt states that the pain started while he was at work. He states that the pain sometimes radiates in areas around his rib cage and is worsened by movement, standing, and deep breathing. He describes the pain as an "achy soreness". Pt reports taking Ibuprofen with some mild relief. Denies any fever, chest pain, coughing up blood, heavy lifting, chills, SOB, nausea, emesis, heavy lifting.  Past Medical History  Diagnosis Date  . Medial meniscus tear 01/2012    right knee  . Pneumonia    Past Surgical History  Procedure Laterality Date  . Corneal transplant  12/2009  . Knee arthroscopy  02/20/2012    Procedure: ARTHROSCOPY KNEE;  Surgeon: Alta Corning, MD;  Location: [place];  Service: Orthopedics;  Laterality: Right;  Chondroplasia patella femoral joint, medial meniscal debriedment   Family History  Problem Relation Age of Onset  . Asthma Mother   . Diabetes Father   . Cancer Father     prostate  . Osteoarthritis Sister   . Diabetes Sister    History  Substance Use Topics  . Smoking status: Never Smoker   . Smokeless tobacco: Never Used  . Alcohol Use: No    Review of Systems  Constitutional: Negative for fever and chills.  Eyes: Negative for visual disturbance.   Respiratory: Negative for chest tightness and shortness of breath.   Cardiovascular: Negative for chest pain.  Gastrointestinal: Negative for nausea, vomiting and abdominal pain.  Musculoskeletal: Positive for arthralgias.  All other systems reviewed and are negative.   Allergies  Review of patient's allergies indicates no known allergies.  Home Medications   Prior to Admission medications   Medication Sig Start Date End Date Taking? Authorizing Provider  ibuprofen (ADVIL,MOTRIN) 200 MG tablet Take 400 mg by mouth every 6 (six) hours as needed for moderate pain.   Yes Historical Provider, MD  prednisoLONE acetate (PRED FORTE) 1 % ophthalmic suspension Place 1 drop into the left eye daily.   Yes Historical Provider, MD  diazepam (VALIUM) 10 MG tablet Take 1 tablet (10 mg total) by mouth at bedtime as needed for sleep. Patient not taking: Reported on 12/04/2013 08/30/13   Tamela Oddi Hess, DO  ipratropium (ATROVENT) 0.06 % nasal spray Place 2 sprays into both nostrils 4 (four) times daily. Patient not taking: Reported on 12/04/2013 09/26/13   Billy Fischer, MD  meclizine (ANTIVERT) 25 MG tablet Take 1 tablet (25 mg total) by mouth 3 (three) times daily as needed for dizziness. Patient not taking: Reported on 12/04/2013 08/30/13   Nolon Rod, DO  meloxicam (MOBIC) 15 MG tablet Take 1 tablet (15 mg total) by mouth daily. 12/04/13   Antonietta Breach, PA-C  Multiple Vitamins-Minerals (MULTIVITAMIN GUMMIES ADULT)  CHEW Chew 1 capsule by mouth daily.    Historical Provider, MD  ondansetron (ZOFRAN) 4 MG tablet Take 1 tablet (4 mg total) by mouth every 6 (six) hours. Prn n/v. Patient not taking: Reported on 12/04/2013 09/26/13   Billy Fischer, MD  sildenafil (VIAGRA) 50 MG tablet Take 1 tablet (50 mg total) by mouth daily as needed for erectile dysfunction. Patient not taking: Reported on 12/04/2013 10/23/13   Leeanne Rio, MD   Triage Vitals:BP 115/79 mmHg  Pulse 102  Temp(Src) 98.5 F (36.9 C)  (Oral)  Resp 18  Ht 5\' 8"  (1.727 m)  Wt 260 lb (117.935 kg)  BMI 39.54 kg/m2  SpO2 98%  Physical Exam  Constitutional: He is oriented to person, place, and time. He appears well-developed and well-nourished. No distress.  Nontoxic/nonseptic appearing  HENT:  Head: Normocephalic and atraumatic.  Eyes: Conjunctivae and EOM are normal. No scleral icterus.  Neck: Normal range of motion.  Cardiovascular: Regular rhythm and normal heart sounds.  Tachycardia present.   Mild tachycardia to 105 bpm  Pulmonary/Chest: Effort normal. No respiratory distress. He has no wheezes. He has no rales. He exhibits tenderness.  Chest expansion symmetric. Respirations even and unlabored. No crepitus. There is mild tenderness to the inferior left chest wall along the posterior axillary line. No bony tenderness.  Musculoskeletal: Normal range of motion.  Neurological: He is alert and oriented to person, place, and time. He exhibits normal muscle tone. Coordination normal.  GCS 15. Speech is goal oriented. Patient moves extremities without ataxia.  Skin: Skin is warm and dry. No rash noted. He is not diaphoretic. No erythema. No pallor.  Psychiatric: He has a normal mood and affect. His behavior is normal.  Nursing note and vitals reviewed.   ED Course  Procedures (including critical care time)  DIAGNOSTIC STUDIES: Oxygen Saturation is 98% on RA, normal by my interpretation.    COORDINATION OF CARE: 9:22 PM- Will order a Ddimer. Pt advised of plan for treatment and pt agrees.  Labs Review Labs Reviewed  D-DIMER, QUANTITATIVE    Imaging Review Dg Ribs Unilateral W/chest Left  12/04/2013   CLINICAL DATA:  Well axillary left-sided rib pain yesterday with no injury, initial evaluation  EXAM: LEFT RIBS AND CHEST - 3+ VIEW  COMPARISON:  None.  FINDINGS: No fracture or other bone lesions are seen involving the ribs. There is no evidence of pneumothorax or pleural effusion. Both lungs are clear. Heart size  and mediastinal contours are within normal limits.  IMPRESSION: Negative.   Electronically Signed   By: Skipper Cliche M.D.   On: 12/04/2013 21:32    MDM   Final diagnoses:  Costochondral chest pain    38 year old male presents to the emergency department for further evaluation of left sided chest wall pain. Pain began while at work yesterday. Patient without tachypnea, dyspnea, or hypoxia. No fever. Lung sounds clear bilaterally. Chest x-ray shows no evidence of focal consolidation or pneumonia. No pleural effusion or pneumothorax. No rib fractures. Given that patient did have a recent surgical procedure on his knee, in addition to his mild tachycardia, d-dimer ordered for further evaluation of possible pulmonary embolus. D-dimer is negative, effectively ruling out PE as cause of symptoms today. Suspect musculoskeletal cause of pain and will manage as outpatient with Mobic. Return precautions discussed and provided. Patient agreeable to plan with no unaddressed concerns.  I personally performed the services described in this documentation, which was scribed in my presence. The recorded information  has been reviewed and is accurate.   Filed Vitals:   12/04/13 2001  BP: 115/79  Pulse: 102  Temp: 98.5 F (36.9 C)  TempSrc: Oral  Resp: 18  Height: 5\' 8"  (1.727 m)  Weight: 260 lb (117.935 kg)  SpO2: 98%     Antonietta Breach, PA-C 12/04/13 Mazomanie Knapp, MD 12/04/13 2259

## 2013-12-04 NOTE — Discharge Instructions (Signed)
Costochondritis Costochondritis, sometimes called Tietze syndrome, is a swelling and irritation (inflammation) of the tissue (cartilage) that connects your ribs with your breastbone (sternum). It causes pain in the chest and rib area. Costochondritis usually goes away on its own over time. It can take up to 6 weeks or longer to get better, especially if you are unable to limit your activities. CAUSES  Some cases of costochondritis have no known cause. Possible causes include:  Injury (trauma).  Exercise or activity such as lifting.  Severe coughing. SIGNS AND SYMPTOMS  Pain and tenderness in the chest and rib area.  Pain that gets worse when coughing or taking deep breaths.  Pain that gets worse with specific movements. DIAGNOSIS  Your health care provider will do a physical exam and ask about your symptoms. Chest X-rays or other tests may be done to rule out other problems. TREATMENT  Costochondritis usually goes away on its own over time. Your health care provider may prescribe medicine to help relieve pain. HOME CARE INSTRUCTIONS   Avoid exhausting physical activity. Try not to strain your ribs during normal activity. This would include any activities using chest, abdominal, and side muscles, especially if heavy weights are used.  Apply ice to the affected area for the first 2 days after the pain begins.  Put ice in a plastic bag.  Place a towel between your skin and the bag.  Leave the ice on for 20 minutes, 2-3 times a day.  Only take over-the-counter or prescription medicines as directed by your health care provider. SEEK MEDICAL CARE IF:  You have redness or swelling at the rib joints. These are signs of infection.  Your pain does not go away despite rest or medicine. SEEK IMMEDIATE MEDICAL CARE IF:   Your pain increases or you are very uncomfortable.  You have shortness of breath or difficulty breathing.  You cough up blood.  You have worse chest pains,  sweating, or vomiting.  You have a fever or persistent symptoms for more than 2-3 days.  You have a fever and your symptoms suddenly get worse. MAKE SURE YOU:   Understand these instructions.  Will watch your condition.  Will get help right away if you are not doing well or get worse. Document Released: 10/13/2004 Document Revised: 10/24/2012 Document Reviewed: 08/07/2012 Exit[employer] Patient Information 2015 [employer]. This information is not intended to replace advice given to you by your health care provider. Make sure you discuss any questions you have with your health care provider.  

## 2014-01-07 ENCOUNTER — Other Ambulatory Visit: Payer: Self-pay | Admitting: *Deleted

## 2014-01-07 MED ORDER — NITROGLYCERIN 0.2 MG/HR TD PT24: MEDICATED_PATCH | TRANSDERMAL | Status: DC

## 2014-01-29 ENCOUNTER — Encounter: Payer: Self-pay | Admitting: Family Medicine

## 2014-01-29 ENCOUNTER — Ambulatory Visit (INDEPENDENT_AMBULATORY_CARE_PROVIDER_SITE_OTHER): Payer: Self-pay | Admitting: Family Medicine

## 2014-01-29 ENCOUNTER — Ambulatory Visit: Payer: Self-pay | Admitting: Family Medicine

## 2014-01-29 VITALS — BP 123/88 | HR 126 | Temp 98.4°F | Ht 68.0 in | Wt 267.1 lb

## 2014-01-29 DIAGNOSIS — D487 Neoplasm of uncertain behavior of other specified sites: Secondary | ICD-10-CM

## 2014-01-30 ENCOUNTER — Other Ambulatory Visit: Payer: Self-pay | Admitting: Family Medicine

## 2014-01-30 ENCOUNTER — Ambulatory Visit (INDEPENDENT_AMBULATORY_CARE_PROVIDER_SITE_OTHER): Payer: Self-pay | Admitting: Family Medicine

## 2014-01-30 DIAGNOSIS — D487 Neoplasm of uncertain behavior of other specified sites: Secondary | ICD-10-CM | POA: Insufficient documentation

## 2014-01-30 DIAGNOSIS — L980 Pyogenic granuloma: Secondary | ICD-10-CM

## 2014-01-30 NOTE — Progress Notes (Signed)
   Subjective:    Patient ID: Derrick Scott, male    DOB: 1975-12-02, 39 y.o.   MRN: [id number]  HPI Lesion on LEFT foot plantar surface since November 2014. Enlarging, bleeding, painful.   Review of Systems Pertinent review of systems: negative for fever or unusual weight change.     Objective:   Physical Exam WD obese male NAD LEFT foot plantar surface reveal nickel sized lesion  PROCEDURE NOTE: Patient given informed consent, signed copy in the chart. Appropriate time out was taken. Area is prepped and draped in usual sterile fashion. 7 mL of 1% lidocaine with epinephrine used for local anesthesia. We waited 10 minutes for anesthesia to become complete. I then shaved off the lesion. There is remaining lesion underneath the plane of the epidermis. I curetted this and cauterized using fulguration from the Hyfrecator 3. There was minimal bleeding. The base of the lesion seems somewhat fibrous. After the third debridement cycle the base looked like cleaning healthy normal tissue. I cauterized the bleeders and then applied antibiotic ointment. I placed a nonstick pad and then gauze padding over the lesion and then wrapped with some Coban. I gave him Prandin instructions for care. Given the complexity of this lesion I will send it for pathology. After removal I'm not sure at all that this was pyogenic granuloma.     Assessment & Plan:

## 2014-01-30 NOTE — Progress Notes (Signed)
   Subjective:    Patient ID: Charlestine Night II, male    DOB: 1975-09-06, 39 y.o.   MRN: [id number]  HPI Lesion on his left foot on the plantar surface since November 2014. Stenotic: Injury. Since then it's gotten bigger. Sometimes it bleeds. Very comfortable. He thinks it's a wart.   Review of Systems No fever, sweats, chills. No ankle swelling or other joint pains.    Objective:   Physical Exam   GENERAL: Well-developed obese male no acute distress FOOT: Left. Plantar surface reveals a 1 cm in diameter lesion that has multi lobulated appearance. Is surrounded by very thickened callus on his soles of his feet. There is no surrounding erythema. It is tender to palpation and extrudes from the plane of the callus of the rest of his foot.      Assessment & Plan:  #1. Lesion on the plantar surface of foot. I don't think this is a wart although that can't be entirely ruled out. It looks like a pyogenic granuloma. I'll see him back tomorrow to get it removed and sent it off for pathology.

## 2014-01-30 NOTE — Patient Instructions (Signed)
Wash area twice a day with soap and water, dry well, then apply some antibiotic ointment and a fresh bandaid. Do this for five days. It shoued then be healed well enough to go without a bandaid.If you notice increased redness, swelling or pain, call me. I will notify you if the pathology.

## 2014-02-04 ENCOUNTER — Telehealth: Payer: Self-pay | Admitting: Family Medicine

## 2014-02-04 ENCOUNTER — Encounter: Payer: Self-pay | Admitting: Family Medicine

## 2014-02-04 NOTE — Telephone Encounter (Signed)
Dear [name] Team Please tell him the lesion I took off his foot shows no sign of cancer. This is good news. I'm sending him a letter with some more details but basically unless it grows back we'll have to do anything. THANKS! Derrick Scott

## 2014-02-04 NOTE — Telephone Encounter (Signed)
Spoke with pt and gave him information below. Katharina Caper, April D

## 2014-03-17 ENCOUNTER — Other Ambulatory Visit: Payer: Self-pay | Admitting: Emergency Medicine

## 2014-03-17 ENCOUNTER — Emergency Department (HOSPITAL_BASED_OUTPATIENT_CLINIC_OR_DEPARTMENT_OTHER)
Admission: EM | Admit: 2014-03-17 | Discharge: 2014-03-17 | Disposition: A | Discharge status: Home / Self Care | Payer: Self-pay | Attending: Emergency Medicine | Admitting: Emergency Medicine

## 2014-03-17 ENCOUNTER — Encounter (HOSPITAL_BASED_OUTPATIENT_CLINIC_OR_DEPARTMENT_OTHER): Payer: Self-pay | Admitting: Emergency Medicine

## 2014-03-17 DIAGNOSIS — J029 Acute pharyngitis, unspecified: Secondary | ICD-10-CM | POA: Insufficient documentation

## 2014-03-17 DIAGNOSIS — H8110 Benign paroxysmal vertigo, unspecified ear: Secondary | ICD-10-CM | POA: Insufficient documentation

## 2014-03-17 DIAGNOSIS — Z87828 Personal history of other (healed) physical injury and trauma: Secondary | ICD-10-CM | POA: Insufficient documentation

## 2014-03-17 DIAGNOSIS — Z791 Long term (current) use of non-steroidal anti-inflammatories (NSAID): Secondary | ICD-10-CM | POA: Insufficient documentation

## 2014-03-17 DIAGNOSIS — Z8701 Personal history of pneumonia (recurrent): Secondary | ICD-10-CM | POA: Insufficient documentation

## 2014-03-17 HISTORY — DX: Benign paroxysmal vertigo, unspecified ear: H81.10

## 2014-03-17 LAB — CBC WITH DIFFERENTIAL/PLATELET
BASOS ABS: 0 10*3/uL (ref 0.0–0.1)
BASOS PCT: 1 % (ref 0–1)
Eosinophils Absolute: 0.1 10*3/uL (ref 0.0–0.7)
Eosinophils Relative: 2 % (ref 0–5)
HCT: 50.2 % (ref 39.0–52.0)
HEMOGLOBIN: 16.8 g/dL (ref 13.0–17.0)
Lymphocytes Relative: 44 % (ref 12–46)
Lymphs Abs: 3 10*3/uL (ref 0.7–4.0)
MCH: 28.4 pg (ref 26.0–34.0)
MCHC: 33.5 g/dL (ref 30.0–36.0)
MCV: 84.9 fL (ref 78.0–100.0)
MONO ABS: 0.3 10*3/uL (ref 0.1–1.0)
Monocytes Relative: 5 % (ref 3–12)
Neutro Abs: 3.4 10*3/uL (ref 1.7–7.7)
Neutrophils Relative %: 48 % (ref 43–77)
Platelets: 253 10*3/uL (ref 150–400)
RBC: 5.91 MIL/uL — AB (ref 4.22–5.81)
RDW: 13.5 % (ref 11.5–15.5)
WBC: 6.8 10*3/uL (ref 4.0–10.5)

## 2014-03-17 LAB — BASIC METABOLIC PANEL
ANION GAP: 3 — AB (ref 5–15)
BUN: 11 mg/dL (ref 6–23)
CO2: 30 mmol/L (ref 19–32)
Calcium: 9.4 mg/dL (ref 8.4–10.5)
Chloride: 104 mmol/L (ref 96–112)
Creatinine, Ser: 1.35 mg/dL (ref 0.50–1.35)
GFR calc Af Amer: 75 mL/min — ABNORMAL LOW (ref >90)
GFR calc non Af Amer: 65 mL/min — ABNORMAL LOW (ref >90)
GLUCOSE: 132 mg/dL — AB (ref 70–99)
Potassium: 5.6 mmol/L — ABNORMAL HIGH (ref 3.5–5.1)
SODIUM: 137 mmol/L (ref 135–145)

## 2014-03-17 LAB — RAPID STREP SCREEN (MED CTR MEBANE ONLY): STREPTOCOCCUS, GROUP A SCREEN (DIRECT): NEGATIVE

## 2014-03-17 LAB — POTASSIUM: Potassium: 4.4 mmol/L (ref 3.5–5.1)

## 2014-03-17 MED ORDER — SODIUM CHLORIDE 0.9 % IV BOLUS (SEPSIS)
1000.0000 mL | Freq: Once | INTRAVENOUS | Status: AC
  Administered 2014-03-17: 1000 mL via INTRAVENOUS

## 2014-03-17 MED ORDER — MECLIZINE HCL 25 MG PO TABS
25.0000 mg | ORAL_TABLET | Freq: Once | ORAL | Status: AC
  Administered 2014-03-17: 25 mg via ORAL
  Filled 2014-03-17: qty 1

## 2014-03-17 MED ORDER — MECLIZINE HCL 25 MG PO TABS: 25.0000 mg | ORAL_TABLET | Freq: Four times a day (QID) | ORAL | Status: DC

## 2014-03-17 NOTE — ED Provider Notes (Signed)
CSN: [id number]     Arrival date & time 03/17/14  1326 History   First MD Initiated Contact with Patient 03/17/14 1344     Chief Complaint  Patient presents with  . Dizziness    when lying down only      (Consider location/radiation/quality/duration/timing/severity/associated sxs/prior Treatment) HPI Comments: Pt states that he has a history of vertigo and was treated with exercises. Pt states that he has been doing them over the last week. Pt states that the symptoms are now just when he lays down. Pt states that he has also been doing juicing over the last week. No fever, blurred vision. Pt states that he is also having a sore throat and his daughter was diagnosed last week  Patient is a 39 y.o. male presenting with dizziness. The history is provided by the patient. No language interpreter was used.  Dizziness Quality:  Room spinning Severity:  Moderate Onset quality:  Sudden Duration:  1 week Timing:  Intermittent Progression:  Improving Chronicity:  New Relieved by:  Nothing Worsened by:  Nothing Ineffective treatments:  None tried Associated symptoms: no headaches, no nausea, no palpitations, no tinnitus, no vomiting and no weakness     Past Medical History  Diagnosis Date  . Medial meniscus tear 01/2012    right knee  . Pneumonia   . Vertigo, benign paroxysmal 2015   Past Surgical History  Procedure Laterality Date  . Corneal transplant  12/2009  . Knee arthroscopy  02/20/2012    Procedure: ARTHROSCOPY KNEE;  Surgeon: Alta Corning, MD;  Location: [place];  Service: Orthopedics;  Laterality: Right;  Chondroplasia patella femoral joint, medial meniscal debriedment   Family History  Problem Relation Age of Onset  . Asthma Mother   . Diabetes Father   . Cancer Father     prostate  . Osteoarthritis Sister   . Diabetes Sister    History  Substance Use Topics  . Smoking status: Never Smoker   . Smokeless tobacco: Never Used  . Alcohol Use: No     Review of Systems  HENT: Negative for tinnitus.   Cardiovascular: Negative for palpitations.  Gastrointestinal: Negative for nausea and vomiting.  Neurological: Positive for dizziness. Negative for weakness and headaches.  All other systems reviewed and are negative.     Allergies  Review of patient's allergies indicates no known allergies.  Home Medications   Prior to Admission medications   Medication Sig Start Date End Date Taking? Authorizing Provider  ibuprofen (ADVIL,MOTRIN) 200 MG tablet Take 400 mg by mouth every 6 (six) hours as needed for moderate pain.    Historical Provider, MD  meloxicam (MOBIC) 15 MG tablet Take 1 tablet (15 mg total) by mouth daily. 12/04/13   Antonietta Breach, PA-C  Multiple Vitamins-Minerals (MULTIVITAMIN GUMMIES ADULT) CHEW Chew 1 capsule by mouth daily.    Historical Provider, MD   BP 126/77 mmHg  Pulse 106  Temp(Src) 97.8 F (36.6 C) (Oral)  Resp 18  Ht 5\' 8"  (1.727 m)  Wt 262 lb (118.842 kg)  BMI 39.85 kg/m2  SpO2 96% Physical Exam  Constitutional: He is oriented to person, place, and time. He appears well-developed and well-nourished.  HENT:  Right Ear: External ear normal.  Left Ear: External ear normal.  Mouth/Throat: Posterior oropharyngeal edema and posterior oropharyngeal erythema present.  Eyes: Conjunctivae and EOM are normal. Pupils are equal, round, and reactive to light.  Cardiovascular: Normal rate and regular rhythm.   Pulmonary/Chest: Effort normal.  Abdominal: Bowel sounds are normal.  Musculoskeletal: Normal range of motion.  Neurological: He is alert and oriented to person, place, and time. He exhibits normal muscle tone. Coordination normal.  - finger to nose. - pronator drift.ambulates without assistance  Skin: Skin is warm and dry.  Psychiatric: He has a normal mood and affect.  Nursing note and vitals reviewed.   ED Course  Procedures (including critical care time) Labs Review Labs Reviewed  BASIC  METABOLIC PANEL - Abnormal; Notable for the following:    Potassium 5.6 (*)    Glucose, Bld 132 (*)    GFR calc non Af Amer 65 (*)    GFR calc Af Amer 75 (*)    Anion gap 3 (*)    All other components within normal limits  CBC WITH DIFFERENTIAL/PLATELET - Abnormal; Notable for the following:    RBC 5.91 (*)    All other components within normal limits  RAPID STREP SCREEN  POTASSIUM    Imaging Review No results found.   EKG Interpretation   Date/Time:  Monday March 17 2014 14:49:41 EST Ventricular Rate:  91 PR Interval:  156 QRS Duration: 90 QT Interval:  328 QTC Calculation: 403 R Axis:   -57 Text Interpretation:  Normal sinus rhythm Left anterior fascicular block  Minimal voltage criteria for LVH, may be normal variant Abnormal ECG  Confirmed by DELOS  MD, DOUGLAS ([id number]) on 03/17/2014 3:15:26 PM      MDM   Final diagnoses:  BPPV (benign paroxysmal positional vertigo), unspecified laterality  Pharyngitis    No strep. Pt is feeling better after meclizine. Pt is neurologically intact. Likely his vertigo. Discussed return precautions    Glendell Docker, NP 03/17/14 [place], MD 03/17/14 [phone]

## 2014-03-17 NOTE — ED Notes (Signed)
40 yo male with c/o dizziness while lying only. Last night new onset of sore throat- Daughter has strep throat. [facility] LOC had N/V Saturday. Hx of Vertigo. NAD at triage.

## 2014-03-17 NOTE — ED Notes (Signed)
Orthostatics at triage.

## 2014-03-17 NOTE — Discharge Instructions (Signed)
Benign Positional Vertigo Vertigo means you feel like you or your surroundings are moving when they are not. Benign positional vertigo is the most common form of vertigo. Benign means that the cause of your condition is not serious. Benign positional vertigo is more common in older adults. CAUSES  Benign positional vertigo is the result of an upset in the labyrinth system. This is an area in the middle ear that helps control your balance. This may be caused by a viral infection, head injury, or repetitive motion. However, often no specific cause is found. SYMPTOMS  Symptoms of benign positional vertigo occur when you move your head or eyes in different directions. Some of the symptoms may include:  Loss of balance and falls.  Vomiting.  Blurred vision.  Dizziness.  Nausea.  Involuntary eye movements (nystagmus). DIAGNOSIS  Benign positional vertigo is usually diagnosed by physical exam. If the specific cause of your benign positional vertigo is unknown, your caregiver may perform imaging tests, such as magnetic resonance imaging (MRI) or computed tomography (CT). TREATMENT  Your caregiver may recommend movements or procedures to correct the benign positional vertigo. Medicines such as meclizine, benzodiazepines, and medicines for nausea may be used to treat your symptoms. In rare cases, if your symptoms are caused by certain conditions that affect the inner ear, you may need surgery. HOME CARE INSTRUCTIONS   Follow your caregiver's instructions.  Move slowly. Do not make sudden body or head movements.  Avoid driving.  Avoid operating heavy machinery.  Avoid performing any tasks that would be dangerous to you or others during a vertigo episode.  Drink enough fluids to keep your urine clear or pale yellow. SEEK IMMEDIATE MEDICAL CARE IF:   You develop problems with walking, weakness, numbness, or using your arms, hands, or legs.  You have difficulty speaking.  You develop  severe headaches.  Your nausea or vomiting continues or gets worse.  You develop visual changes.  Your family or friends notice any behavioral changes.  Your condition gets worse.  You have a fever.  You develop a stiff neck or sensitivity to light. MAKE SURE YOU:   Understand these instructions.  Will watch your condition.  Will get help right away if you are not doing well or get worse. Document Released: 10/11/2005 Document Revised: 03/28/2011 Document Reviewed: 09/23/2010 [employer] Patient Information 2015 [employer]. This information is not intended to replace advice given to you by your health care provider. Make sure you discuss any questions you have with your health care provider.    

## 2014-03-19 LAB — CULTURE, GROUP A STREP: STREP A CULTURE: NEGATIVE

## 2014-04-23 ENCOUNTER — Emergency Department (HOSPITAL_BASED_OUTPATIENT_CLINIC_OR_DEPARTMENT_OTHER): Payer: Self-pay

## 2014-04-23 ENCOUNTER — Emergency Department (HOSPITAL_BASED_OUTPATIENT_CLINIC_OR_DEPARTMENT_OTHER)
Admission: EM | Admit: 2014-04-23 | Discharge: 2014-04-23 | Disposition: A | Discharge status: Home / Self Care | Payer: Self-pay | Attending: Emergency Medicine | Admitting: Emergency Medicine

## 2014-04-23 ENCOUNTER — Encounter (HOSPITAL_BASED_OUTPATIENT_CLINIC_OR_DEPARTMENT_OTHER): Payer: Self-pay

## 2014-04-23 DIAGNOSIS — Z79899 Other long term (current) drug therapy: Secondary | ICD-10-CM | POA: Insufficient documentation

## 2014-04-23 DIAGNOSIS — R109 Unspecified abdominal pain: Secondary | ICD-10-CM

## 2014-04-23 DIAGNOSIS — R1030 Lower abdominal pain, unspecified: Secondary | ICD-10-CM | POA: Insufficient documentation

## 2014-04-23 DIAGNOSIS — Z791 Long term (current) use of non-steroidal anti-inflammatories (NSAID): Secondary | ICD-10-CM | POA: Insufficient documentation

## 2014-04-23 DIAGNOSIS — Z8669 Personal history of other diseases of the nervous system and sense organs: Secondary | ICD-10-CM | POA: Insufficient documentation

## 2014-04-23 DIAGNOSIS — Z8701 Personal history of pneumonia (recurrent): Secondary | ICD-10-CM | POA: Insufficient documentation

## 2014-04-23 LAB — URINALYSIS, ROUTINE W REFLEX MICROSCOPIC
BILIRUBIN URINE: NEGATIVE
Glucose, UA: NEGATIVE mg/dL
Ketones, ur: NEGATIVE mg/dL
Leukocytes, UA: NEGATIVE
NITRITE: NEGATIVE
PH: 5 (ref 5.0–8.0)
Protein, ur: NEGATIVE mg/dL
SPECIFIC GRAVITY, URINE: 1.029 (ref 1.005–1.030)
UROBILINOGEN UA: 0.2 mg/dL (ref 0.0–1.0)

## 2014-04-23 LAB — URINE MICROSCOPIC-ADD ON

## 2014-04-23 MED ORDER — NAPROXEN 500 MG PO TABS: 500.0000 mg | ORAL_TABLET | Freq: Two times a day (BID) | ORAL | Status: DC

## 2014-04-23 MED ORDER — HYDROCODONE-ACETAMINOPHEN 5-325 MG PO TABS: 2.0000 | ORAL_TABLET | ORAL | Status: DC | PRN

## 2014-04-23 MED ORDER — TAMSULOSIN HCL 0.4 MG PO CAPS: 0.4000 mg | ORAL_CAPSULE | Freq: Two times a day (BID) | ORAL | Status: DC

## 2014-04-23 NOTE — Discharge Instructions (Signed)
Flank Pain Flank pain refers to pain that is located on the side of the body between the upper abdomen and the back. The pain may occur over a short period of time (acute) or may be long-term or reoccurring (chronic). It may be mild or severe. Flank pain can be caused by many things. CAUSES  Some of the more common causes of flank pain include:  Muscle strains.   Muscle spasms.   A disease of your spine (vertebral disk disease).   A lung infection (pneumonia).   Fluid around your lungs (pulmonary edema).   A kidney infection.   Kidney stones.   A very painful skin rash caused by the chickenpox virus (shingles).   Gallbladder disease.  [place] care will depend on the cause of your pain. In general,  Rest as directed by your caregiver.  Drink enough fluids to keep your urine clear or pale yellow.  Only take over-the-counter or prescription medicines as directed by your caregiver. Some medicines may help relieve the pain.  Tell your caregiver about any changes in your pain.  Follow up with your caregiver as directed. SEEK IMMEDIATE MEDICAL CARE IF:   Your pain is not controlled with medicine.   You have new or worsening symptoms.  Your pain increases.   You have abdominal pain.   You have shortness of breath.   You have persistent nausea or vomiting.   You have swelling in your abdomen.   You feel faint or pass out.   You have blood in your urine.  You have a fever or persistent symptoms for more than 2-3 days.  You have a fever and your symptoms suddenly get worse. MAKE SURE YOU:   Understand these instructions.  Will watch your condition.  Will get help right away if you are not doing well or get worse. Document Released: 02/24/2005 Document Revised: 09/28/2011 Document Reviewed: 08/18/2011 [facility] Patient Information 2015 [place], [place]. This information is not intended to replace advice given to you by your  health care provider. Make sure you discuss any questions you have with your health care provider.  Please take your medications as prescribed. Please follow-up with your urologist for further evaluation and management of your flank pain. Return to ED for new or worsening symptoms.

## 2014-04-23 NOTE — ED Notes (Signed)
Left flank pain x 2 days-states feels like kidney stone pain

## 2014-04-23 NOTE — ED Provider Notes (Signed)
CSN: [id number]     Arrival date & time 04/23/14  1157 History   First MD Initiated Contact with Patient 04/23/14 1249     Chief Complaint  Patient presents with  . Flank Pain     (Consider location/radiation/quality/duration/timing/severity/associated sxs/prior Treatment) HPI Sergey Ishler II is a 39 y.o. male with a history of nephrolithiasis comes in for evaluation of left flank pain. Patient states Monday night at approximately 11 PM he began to experience an intermittent left-sided flank discomfort that he characterizes as a sharp stabbing sensation. He rates this discomfort as a 7/10. He has only tried drinking lots of water in order to improve his symptoms which is only provided minimal relief. No other pharmacologic therapies tried. This pain is exacerbated with certain movements. No respiratory component. Denies chest pain, shortness of breath, fevers, nausea or vomiting, dysuria, hematuria, testicular or scrotal pain, overt abdominal pain, constipation, diarrhea. No other aggravating or modifying factors. Patient states he is a patient at [facility] and will be able to follow-up this week for further evaluation.  Past Medical History  Diagnosis Date  . Medial meniscus tear 01/2012    right knee  . Pneumonia   . Vertigo, benign paroxysmal 2015   Past Surgical History  Procedure Laterality Date  . Corneal transplant  12/2009  . Knee arthroscopy  02/20/2012    Procedure: ARTHROSCOPY KNEE;  Surgeon: Alta Corning, MD;  Location: [place];  Service: Orthopedics;  Laterality: Right;  Chondroplasia patella femoral joint, medial meniscal debriedment   Family History  Problem Relation Age of Onset  . Asthma Mother   . Diabetes Father   . Cancer Father     prostate  . Osteoarthritis Sister   . Diabetes Sister    History  Substance Use Topics  . Smoking status: Never Smoker   . Smokeless tobacco: Never Used  . Alcohol Use: No    Review of Systems  A  10 point review of systems was completed and was negative except for pertinent positives and negatives as mentioned in the history of present illness    Allergies  Review of patient's allergies indicates no known allergies.  Home Medications   Prior to Admission medications   Medication Sig Start Date End Date Taking? Authorizing Provider  HYDROcodone-acetaminophen (NORCO) 5-325 MG per tablet Take 2 tablets by mouth every 4 (four) hours as needed. 04/23/14   [name], PA-C  meclizine (ANTIVERT) 25 MG tablet Take 1 tablet (25 mg total) by mouth 4 (four) times daily. 03/17/14   Glendell Docker, NP  naproxen (NAPROSYN) 500 MG tablet Take 1 tablet (500 mg total) by mouth 2 (two) times daily. 04/23/14   [name], PA-C  tamsulosin (FLOMAX) 0.4 MG CAPS capsule Take 1 capsule (0.4 mg total) by mouth 2 (two) times daily. 04/23/14   [name], PA-C   BP 112/69 mmHg  Pulse 78  Temp(Src) 98.2 F (36.8 C) (Oral)  Resp 18  Ht 5\' 8"  (1.727 m)  Wt 268 lb (121.564 kg)  BMI 40.76 kg/m2  SpO2 100% Physical Exam  Constitutional: He is oriented to person, place, and time. He appears well-developed and well-nourished.  HENT:  Head: Normocephalic and atraumatic.  Mouth/Throat: Oropharynx is clear and moist.  Eyes: Conjunctivae are normal. Pupils are equal, round, and reactive to light. Right eye exhibits no discharge. Left eye exhibits no discharge. No scleral icterus.  Neck: Normal range of motion. Neck supple.  Cardiovascular: Normal rate, regular rhythm and normal  heart sounds.   Pulmonary/Chest: Effort normal and breath sounds normal. No respiratory distress. He has no wheezes. He has no rales.  Abdominal: Soft. Bowel sounds are normal. He exhibits no distension and no mass. There is no tenderness. There is no rebound and no guarding.  No CVA tenderness  Musculoskeletal: Normal range of motion. He exhibits no edema or tenderness.  Neurological: He is alert and oriented to person,  place, and time.  Cranial Nerves II-XII grossly intact  Skin: Skin is warm and dry. No rash noted.  No lesions noted to left flank or back. No bony step-offs, crepitus or other deformities noted. No tenderness with palpation  Psychiatric: He has a normal mood and affect.  Nursing note and vitals reviewed.   ED Course  Procedures (including critical care time) Labs Review Labs Reviewed  URINALYSIS, ROUTINE W REFLEX MICROSCOPIC - Abnormal; Notable for the following:    Hgb urine dipstick SMALL (*)    All other components within normal limits  URINE MICROSCOPIC-ADD ON - Abnormal; Notable for the following:    Bacteria, UA FEW (*)    All other components within normal limits    Imaging Review US Renal  04/23/2014   CLINICAL DATA:  Initial encounter for left flank pain for 2 days.  EXAM: RENAL/URINARY TRACT ULTRASOUND COMPLETE  COMPARISON:  CT scan from 05/19/2013.  FINDINGS: Right Kidney:  Length: 11.5 cm. Echogenicity within normal limits. No mass or hydronephrosis visualized.  Left Kidney:  Length: 10.6 cm. Echogenicity within normal limits. No mass or hydronephrosis visualized.  Bladder:  Decompressed.  IMPRESSION: Normal exam.   Electronically Signed   By: Misty Stanley M.D.   On: 04/23/2014 14:13     EKG Interpretation None     Meds given in ED:  Medications - No data to display  Discharge Medication List as of 04/23/2014  2:57 PM    START taking these medications   Details  HYDROcodone-acetaminophen (NORCO) 5-325 MG per tablet Take 2 tablets by mouth every 4 (four) hours as needed., Starting 04/23/2014, Until Discontinued, Print    naproxen (NAPROSYN) 500 MG tablet Take 1 tablet (500 mg total) by mouth 2 (two) times daily., Starting 04/23/2014, Until Discontinued, Print    tamsulosin (FLOMAX) 0.4 MG CAPS capsule Take 1 capsule (0.4 mg total) by mouth 2 (two) times daily., Starting 04/23/2014, Until Discontinued, Print       Filed Vitals:   04/23/14 1204 04/23/14 1455  BP:  123/82 112/69  Pulse: 90 78  Temp: 98.2 F (36.8 C)   TempSrc: Oral   Resp: 18 18  Height: 5\' 8"  (1.727 m)   Weight: 268 lb (121.564 kg)   SpO2:  100%    MDM  Vitals stable - WNL -afebrile Pt resting comfortably in ED. PE--normal abdominal exam. grossly benign physical exam. Labwork--urinalysis shows trace hemoglobin. Otherwise noncontributory Imaging-renal ultrasound negative  DDX--patient with potential nonobstructing kidney stone. We'll treat symptomatically and have patient follow-up with his urologist for definitive care. No evidence of urosepsis Will DC withpain medicines, anti-inflammatories, tamsulosin. I discussed all relevant lab findings and imaging results with pt and they verbalized understanding. Discussed f/u with PCP within 48 hrs and return precautions, pt very amenable to plan. Patient stable, in good condition and is stable for discharge  Final diagnoses:  Flank pain        [name], PA-C 04/23/14 [place], MD 04/24/14 [phone]

## 2014-04-23 NOTE — ED Notes (Signed)
Pa  at bedside. 

## 2014-06-15 ENCOUNTER — Emergency Department (HOSPITAL_BASED_OUTPATIENT_CLINIC_OR_DEPARTMENT_OTHER)
Admission: EM | Admit: 2014-06-15 | Discharge: 2014-06-15 | Disposition: A | Discharge status: Home / Self Care | Payer: Self-pay | Attending: Emergency Medicine | Admitting: Emergency Medicine

## 2014-06-15 ENCOUNTER — Encounter (HOSPITAL_BASED_OUTPATIENT_CLINIC_OR_DEPARTMENT_OTHER): Payer: Self-pay | Admitting: Emergency Medicine

## 2014-06-15 DIAGNOSIS — Z79899 Other long term (current) drug therapy: Secondary | ICD-10-CM | POA: Insufficient documentation

## 2014-06-15 DIAGNOSIS — Z8669 Personal history of other diseases of the nervous system and sense organs: Secondary | ICD-10-CM | POA: Insufficient documentation

## 2014-06-15 DIAGNOSIS — Z8701 Personal history of pneumonia (recurrent): Secondary | ICD-10-CM | POA: Insufficient documentation

## 2014-06-15 DIAGNOSIS — K088 Other specified disorders of teeth and supporting structures: Secondary | ICD-10-CM | POA: Insufficient documentation

## 2014-06-15 DIAGNOSIS — Z791 Long term (current) use of non-steroidal anti-inflammatories (NSAID): Secondary | ICD-10-CM | POA: Insufficient documentation

## 2014-06-15 DIAGNOSIS — K0889 Other specified disorders of teeth and supporting structures: Secondary | ICD-10-CM

## 2014-06-15 DIAGNOSIS — Z87828 Personal history of other (healed) physical injury and trauma: Secondary | ICD-10-CM | POA: Insufficient documentation

## 2014-06-15 MED ORDER — PENICILLIN V POTASSIUM 500 MG PO TABS: 500.0000 mg | ORAL_TABLET | Freq: Four times a day (QID) | ORAL | Status: AC

## 2014-06-15 MED ORDER — IBUPROFEN 800 MG PO TABS: 800.0000 mg | ORAL_TABLET | Freq: Three times a day (TID) | ORAL | Status: DC

## 2014-06-15 NOTE — Discharge Instructions (Signed)
Dental Pain °A tooth ache may be caused by cavities (tooth decay). Cavities expose the nerve of the tooth to air and hot or cold temperatures. It may come from an infection or abscess (also called a boil or furuncle) around your tooth. It is also often caused by dental caries (tooth decay). This causes the pain you are having. °DIAGNOSIS  °Your caregiver can diagnose this problem by exam. °TREATMENT  °· If caused by an infection, it may be treated with medications which kill germs (antibiotics) and pain medications as prescribed by your caregiver. Take medications as directed. °· Only take over-the-counter or prescription medicines for pain, discomfort, or fever as directed by your caregiver. °· Whether the tooth ache today is caused by infection or dental disease, you should see your dentist as soon as possible for further care. °SEEK MEDICAL CARE IF: °The exam and treatment you received today has been provided on an emergency basis only. This is not a substitute for complete medical or dental care. If your problem worsens or new problems (symptoms) appear, and you are unable to meet with your dentist, call or return to this location. °SEEK IMMEDIATE MEDICAL CARE IF:  °· You have a fever. °· You develop redness and swelling of your face, jaw, or neck. °· You are unable to open your mouth. °· You have severe pain uncontrolled by pain medicine. °MAKE SURE YOU:  °· Understand these instructions. °· Will watch your condition. °· Will get help right away if you are not doing well or get worse. °Document Released: 01/03/2005 Document Revised: 03/28/2011 Document Reviewed: 08/22/2007 °ExitCare® Patient Information ©2015 [employer]. This information is not intended to replace advice given to you by your health care provider. Make sure you discuss any questions you have with your health care provider. ° °Emergency Department Resource Guide °1) Find a Doctor and Pay Out of Pocket °Although you won't have to find out who  is covered by your insurance plan, it is a good idea to ask around and get recommendations. You will then need to call the office and see if the doctor you have chosen will accept you as a new patient and what types of options they offer for patients who are self-pay. Some doctors offer discounts or will set up payment plans for their patients who do not have insurance, but you will need to ask so you aren't surprised when you get to your appointment. ° °2) Contact Your Local Health Department °Not all health departments have doctors that can see patients for sick visits, but many do, so it is worth a call to see if yours does. If you don't know where your local health department is, you can check in your phone book. The CDC also has a tool to help you locate your state's health department, and many state websites also have listings of all of their local health departments. ° °3) Find a Walk-in Clinic °If your illness is not likely to be very severe or complicated, you may want to try a walk in clinic. These are popping up all over the country in pharmacies, drugstores, and shopping centers. They're usually staffed by nurse practitioners or physician assistants that have been trained to treat common illnesses and complaints. They're usually fairly quick and inexpensive. However, if you have serious medical issues or chronic medical problems, these are probably not your best option. ° °No Primary Care Doctor: °- Call [facility] Connect at  832-8000 - they can help you locate a primary   care doctor that  accepts your insurance, provides certain services, etc. °- Physician Referral Service- 1-800-533-3463 ° °Chronic Pain Problems: °Organization         Address  Phone   Notes  °[facility]  (336) 297-2271 Patients need to be referred by their primary care doctor.  ° °Medication Assistance: °Organization         Address  Phone   Notes  °[place] [facility] [address]  [address] °[place], [place] [address] (336) 641-8030 --Must be a resident of [place] °-- Must have NO insurance coverage whatsoever (no Medicaid/ Medicare, etc.) °-- The pt. MUST have a primary care doctor that directs their care regularly and follows them in the community °  °[facility]  (866) 331-1348   °[employer]  (888) 892-1162   ° °Agencies that provide inexpensive medical care: °Organization         Address  Phone   Notes  °[facility]  (336) 832-8035   °[facility]    (336) 832-7272   °[facility] [address] °[place], [place] [address] (336) 832-4777   °[facility] [address], °[place] (336) 271-4999   °[facility]    (336) 373-0678   °[facility]    (336) 272-1050   °[facility] ° [address], [place] Phone:  (336) 832-4444, Fax:  (336) 832-4440 Hours of Operation:  9 am - 6 pm, M-F.  Also accepts Medicaid/Medicare and self-pay.  °[facility] ° [address], [place] Phone: (336) 832-3150, Fax: (336) [phone]. Hours of Operation:  8:30 am - 5:30 pm, M-F.  Also accepts Medicaid and self-pay.  °[facility] [place] [address], [place] Phone: (336) 878-6027   °[facility] [address], [place], [place] (336)723-1848, Ext. 123 Mondays & Thursdays: 7-9 AM.  First 15 patients are seen on a first come, first serve basis. °  ° °Medicaid-accepting [place] Providers: ° °Organization         Address  Phone   Notes  °[facility] [address], [place] (336) 641-2100 Also accepts self-pay patients.  °[facility] [address], [place] ° (336) 856-9996   °[facility] [address], [place] (336) 288-8857   °[facility] [address] [place] [address], [place] (336) 299-7000   °Veita Bland [address], [place]  ° (336) 373-1557 Only accepts [place] Access Medicaid patients after they have their name applied to their card.  ° °Self-Pay (no insurance) in [place]: ° °Organization         Address  Phone   Notes  °Sickle Cell Patients, [facility] [address], [place] (336) 832-1970   °[facility] [address], [place] (336) 832-4400   °[facility] ° [address], [place] (336) 992-4800   °[facility]/Dr. Osei-Bonsu ° [address] [place] [address], [place] or [address], [place] (336) [phone] Phone number for both [place] and [place] locations is the same.  °[facility] [address], [place] (336) 299-0000   °[facility] [address] [place] [address],  or [address] (336) 852-7530 °(336) 878-2260   °  [facility] [address], [place] (336) 350-1642, phone; (336) 294-5005, fax Sees patients 1st and 3rd Saturday of every month.  Must not qualify for public or private insurance (i.e. Medicaid, Medicare, [place] Health Choice, Veterans' Benefits) • Household income should be no more than 200% of the poverty level •The clinic cannot treat you if you are pregnant or think you are pregnant • Sexually transmitted diseases are not treated at the clinic.  ° ° °Dental Care: °Organization         Address  Phone  Notes  °[place] [facility] [address], [place] (336) 641-6152 Accepts children up to age 21 who are enrolled in Medicaid or [place] Health Choice; pregnant women with a Medicaid card; and children who have applied for Medicaid or [place] Health Choice, but were declined, whose parents can pay a reduced fee at time of service.  °[facility] [place]  [address], [place] (336) 641-7733 Accepts children up to age 21 who are  enrolled in Medicaid or [place] Health Choice; pregnant women with a Medicaid card; and children who have applied for Medicaid or [place] Health Choice, but were declined, whose parents can pay a reduced fee at time of service.  °[facility] ° [address], [place] (336) 641-4533 Patients are seen by appointment only. Walk-ins are not accepted. [facility] will see patients 18 years of age and older. °Monday - Tuesday (8am-5pm) °Most Wednesdays (8:30-5pm) °$30 per visit, cash only  °[facility] ° [address], [place] (336) 641-4533 Patients are seen by appointment only. Walk-ins are not accepted. [facility] will see patients 18 years of age and older. °One Wednesday Evening (Monthly: Volunteer Based).  $30 per visit, cash only  °[facility]  (919) 537-3737 for adults; Children under age 4, call Graduate Pediatric Dentistry at ([phone]) [phone]. Children aged 4-14, please call (919) [phone] to request a pediatric application. ° Dental services are provided in all areas of dental care including fillings, crowns and bridges, complete and partial dentures, implants, gum treatment, root canals, and extractions. Preventive care is also provided. Treatment is provided to both adults and children. °Patients are selected via a lottery and there is often a waiting list. °  °[facility] [address], °[place] ° (336) 763-8833 [email or web address] °  °[facility] [address], [place], [place] (336)723-1848, Ext. 123 Second and Fourth Thursday of each month, opens at 6:30 AM; Clinic ends at 9 AM.  Patients are seen on a first-come first-served basis, and a limited number are seen during each clinic.  ° °[facility] ° [address], [place], [place] (336) 723-7904   Eligibility Requirements °You must have lived in [place], [place], or [place] counties for at least the last three months. °  You  cannot be eligible for state or federal sponsored healthcare insurance, including Veterans Administration, Medicaid, or Medicare. °  You generally cannot be eligible for healthcare insurance through your employer.  °  How to apply: °Eligibility screenings are held every Tuesday and Wednesday afternoon from 1:00 pm until 4:00 pm. You do not need an appointment for the interview!  °[facility] [address], [place], [place] 336-631-2330   °[facility] [facility]  336-342-8273   °[facility]  336-703-3100   °[facility]  336-570-6415   ° °Behavioral Health Resources in the Community: °Intensive Outpatient Programs °Organization         Address  Phone  Notes  °[facility] [address], [place], [place] 336-878-6098   °[facility] [address], [place], [place] 336-832-9800   °[facility] [address], [place], [place] ° 336-882-2125   °[facility] [address],  °[place], [place] 1-800-853-5163 or 336-641-4981   °Substance Abuse Resources °Organization         Address  Phone  Notes  °[facility]  336-882-2125   °[facility]  336-784-9470   °[facility]  336-285-9073   °[facility]  336-845-3988   °Residential & Outpatient Substance Abuse Program  1-800-659-3381   °Psychological Services °Organization         Address  Phone  Notes  °[facility]  336- 832-9600   °[facility]  336- 378-7881   °[facility] [address], [place] 1-800-853-5163 or 336-641-4981   ° °Mobile Crisis Teams °Organization         Address  Phone  Notes  °[facility], Mobile Crisis Care Unit  1-877-626-1772   °Assertive °[facility] ° [address] [place], [place] 336-834-9664   °Sharon DeEsch [address] °[place] 336-554-5454   ° °Self-Help/Support  Groups °Organization         Address  Phone             Notes  °[facility] [place] - variety of support groups  336- 373-1402 Call for more information  °Narcotics Anonymous (NA), [facility] [address], °[place]  2 meetings at this location  ° °Residential Treatment Programs °Organization         Address  Phone  Notes  °[facility] [address],    °[place]  1-866-801-8205   °[facility] ° [address], [place], [place] 704-293-8524   °[facility] [address], [place] 336-845-3988 Admissions: 8am-3pm M-F  °[facility] [address]    °[place], [place] 336-841-1104   °[facility] [address], [place], [place] 336-379-7146   °[facility] [address]  °[place], [place] 336-285-9073   °[facility] - Intensive Outpatient [address], [place], [place] 336-852-3033   °[facility] [address]  °[place], [place] 1-877-615-2722 or 336-784-9470   °[facility] [address] [place], [place] 336-227-7417 Accepts Medicaid  °[facility] [address]  ° [place] 1-800-659-3381 Substance Abuse/Addiction Treatment  ° °[place] Behavioral Health Resources °Organization         Address  Phone  Notes  °[facility]  (888) 581-9988   °Julie Brannon, PhD [address] [place], [place]   (336) 349-5553 or (336) 951-0000   °[facility]   [address] °[place], [place] (336) 349-4454   °[facility] [address], [place], [place] (336) 342-8316 Insurance/Medicaid/sponsorship through [facility]  °[facility] [address]                                    [place],  (336) 342-8316 Therapy/tele-psych/case  °[facility]   [address]  ° [place], [place] (336) 349-2233    °Dr. Arfeen  (336) 349-4544   °[facility] [place]  [employer] [place] [facility] 1) [address], [place] °2) [address], [place] °3)  [address], [place] (336) 349-3220 °(336) 342-7768 ° °(336) 342-8140   °[place] Child Abuse [facility] (336) 342-1394 or (336) 342-3537 (After Hours)    ° ° ° °

## 2014-06-15 NOTE — ED Provider Notes (Signed)
CSN: [id number]     Arrival date & time 06/15/14  2012 History   First MD Initiated Contact with Patient 06/15/14 2042     Chief Complaint  Patient presents with  . Dental Pain     (Consider location/radiation/quality/duration/timing/severity/associated sxs/prior Treatment) Patient is a 39 y.o. male presenting with tooth pain. The history is provided by the patient.  Dental Pain Location:  Lower Lower teeth location:  32/RL 3rd molar Quality:  Dull Severity:  Moderate Onset quality:  Gradual Duration:  2 days Timing:  Constant Progression:  Worsening Chronicity:  New Context: not crown fracture, not dental caries and not recent dental surgery   Relieved by:  Nothing Worsened by:  Nothing tried Associated symptoms: no fever     Past Medical History  Diagnosis Date  . Medial meniscus tear 01/2012    right knee  . Pneumonia   . Vertigo, benign paroxysmal 2015   Past Surgical History  Procedure Laterality Date  . Corneal transplant  12/2009  . Knee arthroscopy  02/20/2012    Procedure: ARTHROSCOPY KNEE;  Surgeon: Alta Corning, MD;  Location: [place];  Service: Orthopedics;  Laterality: Right;  Chondroplasia patella femoral joint, medial meniscal debriedment   Family History  Problem Relation Age of Onset  . Asthma Mother   . Diabetes Father   . Cancer Father     prostate  . Osteoarthritis Sister   . Diabetes Sister    History  Substance Use Topics  . Smoking status: Never Smoker   . Smokeless tobacco: Never Used  . Alcohol Use: No    Review of Systems  Constitutional: Negative for fever.  Respiratory: Negative for cough and shortness of breath.   Gastrointestinal: Negative for vomiting and abdominal pain.  All other systems reviewed and are negative.     Allergies  Review of patient's allergies indicates no known allergies.  Home Medications   Prior to Admission medications   Medication Sig Start Date End Date Taking? Authorizing  Provider  prednisoLONE 5 MG TABS tablet Apply 5 mg to eye.   Yes Historical Provider, MD  HYDROcodone-acetaminophen (NORCO) 5-325 MG per tablet Take 2 tablets by mouth every 4 (four) hours as needed. 04/23/14   [name], PA-C  ibuprofen (ADVIL,MOTRIN) 800 MG tablet Take 1 tablet (800 mg total) by mouth 3 (three) times daily. 06/15/14   Evelina Bucy, MD  meclizine (ANTIVERT) 25 MG tablet Take 1 tablet (25 mg total) by mouth 4 (four) times daily. 03/17/14   Glendell Docker, NP  naproxen (NAPROSYN) 500 MG tablet Take 1 tablet (500 mg total) by mouth 2 (two) times daily. 04/23/14   [name], PA-C  penicillin v potassium (VEETID) 500 MG tablet Take 1 tablet (500 mg total) by mouth 4 (four) times daily. 06/15/14 06/22/14  Evelina Bucy, MD  tamsulosin (FLOMAX) 0.4 MG CAPS capsule Take 1 capsule (0.4 mg total) by mouth 2 (two) times daily. 04/23/14   [name], PA-C   BP 125/85 mmHg  Pulse 95  Temp(Src) 99.1 F (37.3 C) (Oral)  Resp 18  Ht 5\' 8"  (1.727 m)  Wt 264 lb (119.75 kg)  BMI 40.15 kg/m2  SpO2 98% Physical Exam  Constitutional: He is oriented to person, place, and time. He appears well-developed and well-nourished. No distress.  HENT:  Head: Normocephalic and atraumatic.  Mouth/Throat: Oropharynx is clear and moist. No oropharyngeal exudate.    Eyes: EOM are normal. Pupils are equal, round, and reactive to light.  Neck:  Normal range of motion. Neck supple.  Cardiovascular: Normal rate and regular rhythm.  Exam reveals no friction rub.   No murmur heard. Pulmonary/Chest: Effort normal and breath sounds normal. No respiratory distress. He has no wheezes. He has no rales.  Abdominal: Soft. He exhibits no distension. There is no tenderness. There is no rebound.  Musculoskeletal: Normal range of motion. He exhibits no edema.  Neurological: He is alert and oriented to person, place, and time. No cranial nerve deficit. He exhibits normal muscle tone. Coordination normal.  Skin:  No rash noted. He is not diaphoretic.  Nursing note and vitals reviewed.   ED Course  Procedures (including critical care time) Labs Review Labs Reviewed - No data to display  Imaging Review No results found.   EKG Interpretation None      MDM   Final diagnoses:  Pain, dental    39 year old male comes in with right posterior third molar pain. Began a few days ago. No fever, facial swelling, stridor, difficult breathing. Airway patent and no oral pharyngeal artery values noted. He does have some mild right posterior lower molar tenderness. Will treat with penicillin and Motrin. No abscess seen, no I&D required.    Evelina Bucy, MD 06/15/14 [phone]

## 2014-06-15 NOTE — ED Notes (Signed)
Care assumed at time of d/c, orders received to d/c, pt seen by EDP prior to RN assessment, see MD notes, orders received and initiated.

## 2014-06-15 NOTE — ED Notes (Signed)
Pt c/o right lower jaw/gum line pain for past 2 days.  Pt thinks he may have an abcess or wisdom tooth erupting that is causing the pain.

## 2014-07-25 ENCOUNTER — Encounter: Payer: Self-pay | Admitting: Family Medicine

## 2014-07-25 ENCOUNTER — Ambulatory Visit (INDEPENDENT_AMBULATORY_CARE_PROVIDER_SITE_OTHER): Payer: Self-pay | Admitting: Family Medicine

## 2014-07-25 VITALS — BP 140/75 | HR 85 | Temp 98.4°F | Ht 68.0 in | Wt 273.4 lb

## 2014-07-25 DIAGNOSIS — Z947 Corneal transplant status: Secondary | ICD-10-CM | POA: Insufficient documentation

## 2014-07-25 MED ORDER — PREDNISOLONE ACETATE 1 % OP SUSP: 2.0000 [drp] | Freq: Two times a day (BID) | OPHTHALMIC | Status: DC

## 2014-07-25 NOTE — Assessment & Plan Note (Signed)
Refilled prednisolone gtt today with same dose, freq as previously prescribed. Urged necessity of following up with Ophthalmology in the future. No worry for acute problems at this time.

## 2014-07-25 NOTE — Progress Notes (Signed)
Subjective: Derrick Scott is a 39 y.o. male patient of Dr. Verlon Au presenting for medication refill.  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 From care everywhere I see this was prescribed in 2014:   prednisoLONE acetate (PRED FORTE) 1 % ophthalmic suspension Place 1 drop into the left eye 2 (two) times daily. 5 mL  12 11/09/2012   He needs to follow up with them but is unable to get an appointment as of last week. He has been without this for 2 weeks. Denies new symptoms including redness, itchiness, discharge, new pain.   Objective: BP 140/75 mmHg  Pulse 85  Temp(Src) 98.4 F (36.9 C) (Oral)  Ht 5\' 8"  (1.727 m)  Wt 273 lb 6.4 oz (124.013 kg)  BMI 41.58 kg/m2 Gen: Well appearing HEENT: Normocephalic, sclerae clear, conjunctivae normal, pupils equal and reactive, left eye with circumferential scar with sutures not exposed without signs of irritation/inflammation. Fundoscopy normal bilaterally.   Assessment/Plan: Derrick Scott is a 39 y.o. male here for eye drop refill.

## 2014-07-25 NOTE — Patient Instructions (Signed)
Make sure to schedule your appointment with [facility] before running out again! You've got 6 months. If you have any problems at all call the clinic at [phone].

## 2014-08-14 ENCOUNTER — Encounter (HOSPITAL_COMMUNITY): Payer: Self-pay | Admitting: Emergency Medicine

## 2014-08-14 ENCOUNTER — Emergency Department (HOSPITAL_COMMUNITY): Payer: Self-pay

## 2014-08-14 DIAGNOSIS — Z87828 Personal history of other (healed) physical injury and trauma: Secondary | ICD-10-CM | POA: Insufficient documentation

## 2014-08-14 DIAGNOSIS — R509 Fever, unspecified: Secondary | ICD-10-CM | POA: Insufficient documentation

## 2014-08-14 DIAGNOSIS — Z8701 Personal history of pneumonia (recurrent): Secondary | ICD-10-CM | POA: Insufficient documentation

## 2014-08-14 DIAGNOSIS — R05 Cough: Secondary | ICD-10-CM | POA: Insufficient documentation

## 2014-08-14 DIAGNOSIS — Z8669 Personal history of other diseases of the nervous system and sense organs: Secondary | ICD-10-CM | POA: Insufficient documentation

## 2014-08-14 DIAGNOSIS — R0981 Nasal congestion: Secondary | ICD-10-CM | POA: Insufficient documentation

## 2014-08-14 NOTE — ED Notes (Signed)
Pt. reports nasal congestion , sinus pressure , headache , productive cough , fever and lightheadedness onset yesterday unrelieved by OTC medications , respirations unlabored .

## 2014-08-15 ENCOUNTER — Emergency Department (HOSPITAL_COMMUNITY)
Admission: EM | Admit: 2014-08-15 | Discharge: 2014-08-15 | Disposition: A | Discharge status: Home / Self Care | Payer: Self-pay | Attending: Emergency Medicine | Admitting: Emergency Medicine

## 2014-08-15 DIAGNOSIS — R05 Cough: Secondary | ICD-10-CM

## 2014-08-15 DIAGNOSIS — R509 Fever, unspecified: Secondary | ICD-10-CM

## 2014-08-15 DIAGNOSIS — R059 Cough, unspecified: Secondary | ICD-10-CM

## 2014-08-15 MED ORDER — IBUPROFEN 800 MG PO TABS
800.0000 mg | ORAL_TABLET | Freq: Once | ORAL | Status: AC
Start: 2014-08-15 — End: 2014-08-15
  Administered 2014-08-15: 800 mg via ORAL
  Filled 2014-08-15: qty 1

## 2014-08-15 MED ORDER — MECLIZINE HCL 25 MG PO TABS
25.0000 mg | ORAL_TABLET | Freq: Once | ORAL | Status: AC
  Administered 2014-08-15: 25 mg via ORAL
  Filled 2014-08-15: qty 1

## 2014-08-15 MED ORDER — DM-GUAIFENESIN ER 30-600 MG PO TB12
1.0000 | ORAL_TABLET | Freq: Once | ORAL | Status: AC
  Administered 2014-08-15: 1 via ORAL
  Filled 2014-08-15: qty 1

## 2014-08-15 MED ORDER — MECLIZINE HCL 25 MG PO TABS: 25.0000 mg | ORAL_TABLET | Freq: Three times a day (TID) | ORAL | Status: DC | PRN

## 2014-08-15 NOTE — ED Provider Notes (Signed)
CSN: [id number]     Arrival date & time 08/14/14  2136 History   This chart was scribed for Everlene Balls, MD by Ludger Nutting, ED Scribe. This patient was seen in room [facility] and the patient's care was started 12:28 AM.    Chief Complaint  Patient presents with  . Nasal Congestion  . Cough  . Fever   The history is provided by the patient. No language interpreter was used.     HPI Comments: Derrick Scott is a 39 y.o. male who presents to the Emergency Department complaining of 1-2 days of gradual onset, constant, gradually worsened nasal congestion with associated sinus pressure, cough, and intermittent fever (TMAX 100.3). He reports the cough is occasionally productive of clear to yellow sputum. He denies sick contacts. He also complains of an episode of dizziness earlier today while working outside. He reports a history of vertigo for which he has taken meclizine in the past.   Past Medical History  Diagnosis Date  . Medial meniscus tear 01/2012    right knee  . Pneumonia   . Vertigo, benign paroxysmal 2015   Past Surgical History  Procedure Laterality Date  . Corneal transplant  12/2009  . Knee arthroscopy  02/20/2012    Procedure: ARTHROSCOPY KNEE;  Surgeon: Alta Corning, MD;  Location: [place];  Service: Orthopedics;  Laterality: Right;  Chondroplasia patella femoral joint, medial meniscal debriedment   Family History  Problem Relation Age of Onset  . Asthma Mother   . Diabetes Father   . Cancer Father     prostate  . Osteoarthritis Sister   . Diabetes Sister    History  Substance Use Topics  . Smoking status: Never Smoker   . Smokeless tobacco: Never Used  . Alcohol Use: No    Review of Systems  10 Systems reviewed and all are negative for acute change except as noted in the HPI.    Allergies  Review of patient's allergies indicates no known allergies.  Home Medications   Prior to Admission medications   Medication Sig Start Date  End Date Taking? Authorizing Provider  HYDROcodone-acetaminophen (NORCO) 5-325 MG per tablet Take 2 tablets by mouth every 4 (four) hours as needed. 04/23/14   [name], PA-C  ibuprofen (ADVIL,MOTRIN) 800 MG tablet Take 1 tablet (800 mg total) by mouth 3 (three) times daily. 06/15/14   Evelina Bucy, MD  meclizine (ANTIVERT) 25 MG tablet Take 1 tablet (25 mg total) by mouth 4 (four) times daily. 03/17/14   Glendell Docker, NP  naproxen (NAPROSYN) 500 MG tablet Take 1 tablet (500 mg total) by mouth 2 (two) times daily. 04/23/14   [name], PA-C  prednisoLONE acetate (PRED FORTE) 1 % ophthalmic suspension Place 2 drops into the left eye 2 (two) times daily. 07/25/14   Patrecia Pour, MD  tamsulosin (FLOMAX) 0.4 MG CAPS capsule Take 1 capsule (0.4 mg total) by mouth 2 (two) times daily. 04/23/14   Benjamin Cartner, PA-C   BP 113/66 mmHg  Pulse 104  Temp(Src) 98.9 F (37.2 C) (Oral)  Resp 14  Ht 5\' 8"  (1.727 m)  Wt 273 lb (123.832 kg)  BMI 41.52 kg/m2  SpO2 100% Physical Exam  Constitutional: He is oriented to person, place, and time. Vital signs are normal. He appears well-developed and well-nourished.  Non-toxic appearance. He does not appear ill. No distress.  HENT:  Head: Normocephalic and atraumatic.  Nose: Nose normal.  Mouth/Throat: Oropharynx is clear and moist.  No oropharyngeal exudate.  Eyes: Conjunctivae and EOM are normal. Pupils are equal, round, and reactive to light. No scleral icterus.  Neck: Normal range of motion. Neck supple. No tracheal deviation, no edema, no erythema and normal range of motion present. No thyroid mass and no thyromegaly present.  Cardiovascular: Normal rate, regular rhythm, S1 normal, S2 normal, normal heart sounds, intact distal pulses and normal pulses.  Exam reveals no gallop and no friction rub.   No murmur heard. Pulses:      Radial pulses are 2+ on the right side, and 2+ on the left side.       Dorsalis pedis pulses are 2+ on the right side,  and 2+ on the left side.  Pulmonary/Chest: Effort normal and breath sounds normal. No respiratory distress. He has no wheezes. He has no rhonchi. He has no rales.  Abdominal: Soft. Normal appearance and bowel sounds are normal. He exhibits no distension, no ascites and no mass. There is no hepatosplenomegaly. There is no tenderness. There is no rebound, no guarding and no CVA tenderness.  Musculoskeletal: Normal range of motion. He exhibits no edema or tenderness.  Lymphadenopathy:    He has no cervical adenopathy.  Neurological: He is alert and oriented to person, place, and time. He has normal strength. No cranial nerve deficit or sensory deficit.  Skin: Skin is warm, dry and intact. No petechiae and no rash noted. He is not diaphoretic. No erythema. No pallor.  Psychiatric: He has a normal mood and affect. His behavior is normal. Judgment normal.  Nursing note and vitals reviewed.   ED Course  Procedures (including critical care time)  DIAGNOSTIC STUDIES: Oxygen Saturation is 96% on RA, adequate by my interpretation.    COORDINATION OF CARE: 12:33 AM Discussed treatment plan with pt at bedside and pt agreed to plan.   Labs Review Labs Reviewed - No data to display  Imaging Review Dg Chest 2 View  08/14/2014   CLINICAL DATA:  Fever, cough and shortness of breath  EXAM: CHEST  2 VIEW  COMPARISON:  12/04/2013  FINDINGS: The heart size and mediastinal contours are within normal limits. Both lungs are clear. The visualized skeletal structures are unremarkable.  IMPRESSION: No active cardiopulmonary disease.   Electronically Signed   By: Jerilynn Mages.  Shick M.D.   On: 08/14/2014 22:37     EKG Interpretation None      MDM   Final diagnoses:  Cough  Fever   Patient presents to the emergency department for congestion, cough, sinus pressure over the last 2 days. Chest x-ray is negative for pneumonia. History is consistent with a sinusitis or upper respiratory infection. He was advised on  supportive care. He was given Mucinex, meclizine, Motrin in the emergency department. Will prescribe short course of meclizine to take at home as needed. He otherwise appears well in no acute distress. His vital signs remain within his normal limits and he is safe for discharge.  I personally performed the services described in this documentation, which was scribed in my presence. The recorded information has been reviewed and is accurate.   Everlene Balls, MD 08/15/14 [phone]

## 2014-08-15 NOTE — Discharge Instructions (Signed)
Upper Respiratory Infection, Adult Derrick Scott, your x-ray is negative for pneumonia.  Take over the counter mucinex and motrin for supportive care as this infection runs its course.  See your primary doctor within 3 days for close follow up.  If symptoms worsen, come back to the ED immediately.  Thank you. An upper respiratory infection (URI) is also known as the common cold. It is often caused by a type of germ (virus). Colds are easily spread (contagious). You can pass it to others by kissing, coughing, sneezing, or drinking out of the same glass. Usually, you get better in 1 or 2 weeks.  HOME CARE   Only take medicine as told by your doctor.  Use a warm mist humidifier or breathe in steam from a hot shower.  Drink enough water and fluids to keep your pee (urine) clear or pale yellow.  Get plenty of rest.  Return to work when your temperature is back to normal or as told by your doctor. You may use a face mask and wash your hands to stop your cold from spreading. GET HELP RIGHT AWAY IF:   After the first few days, you feel you are getting worse.  You have questions about your medicine.  You have chills, shortness of breath, or brown or red spit (mucus).  You have yellow or brown snot (nasal discharge) or pain in the face, especially when you bend forward.  You have a fever, puffy (swollen) neck, pain when you swallow, or white spots in the back of your throat.  You have a bad headache, ear pain, sinus pain, or chest pain.  You have a high-pitched whistling sound when you breathe in and out (wheezing).  You have a lasting cough or cough up blood.  You have sore muscles or a stiff neck. MAKE SURE YOU:   Understand these instructions.  Will watch your condition.  Will get help right away if you are not doing well or get worse. Document Released: 06/22/2007 Document Revised: 03/28/2011 Document Reviewed: 04/10/2013 [facility] Patient Information 2015 [place], [place]. This  information is not intended to replace advice given to you by your health care provider. Make sure you discuss any questions you have with your health care provider.  Sinusitis Sinusitis is redness, soreness, and puffiness (inflammation) of the air pockets in the bones of your face (sinuses). The redness, soreness, and puffiness can cause air and mucus to get trapped in your sinuses. This can allow germs to grow and cause an infection.  HOME CARE   Drink enough fluids to keep your pee (urine) clear or pale yellow.  Use a humidifier in your home.  Run a hot shower to create steam in the bathroom. Sit in the bathroom with the door closed. Breathe in the steam 3-4 times a day.  Put a warm, moist washcloth on your face 3-4 times a day, or as told by your doctor.  Use salt water sprays (saline sprays) to wet the thick fluid in your nose. This can help the sinuses drain.  Only take medicine as told by your doctor. GET HELP RIGHT AWAY IF:   Your pain gets worse.  You have very bad headaches.  You are sick to your stomach (nauseous).  You throw up (vomit).  You are very sleepy (drowsy) all the time.  Your face is puffy (swollen).  Your vision changes.  You have a stiff neck.  You have trouble breathing. MAKE SURE YOU:   Understand these instructions.  Will  watch your condition.  Will get help right away if you are not doing well or get worse. Document Released: 06/22/2007 Document Revised: 09/28/2011 Document Reviewed: 08/09/2011 [facility] Patient Information 2015 [place], [place]. This information is not intended to replace advice given to you by your health care provider. Make sure you discuss any questions you have with your health care provider.

## 2014-08-18 ENCOUNTER — Emergency Department (HOSPITAL_COMMUNITY)
Admission: EM | Admit: 2014-08-18 | Discharge: 2014-08-18 | Disposition: A | Discharge status: Home / Self Care | Payer: Self-pay | Attending: Emergency Medicine | Admitting: Emergency Medicine

## 2014-08-18 ENCOUNTER — Emergency Department (HOSPITAL_COMMUNITY): Payer: Self-pay

## 2014-08-18 ENCOUNTER — Encounter (HOSPITAL_COMMUNITY): Payer: Self-pay | Admitting: Emergency Medicine

## 2014-08-18 DIAGNOSIS — R05 Cough: Secondary | ICD-10-CM | POA: Insufficient documentation

## 2014-08-18 DIAGNOSIS — Z87828 Personal history of other (healed) physical injury and trauma: Secondary | ICD-10-CM | POA: Insufficient documentation

## 2014-08-18 DIAGNOSIS — R5383 Other fatigue: Secondary | ICD-10-CM | POA: Insufficient documentation

## 2014-08-18 DIAGNOSIS — Z7952 Long term (current) use of systemic steroids: Secondary | ICD-10-CM | POA: Insufficient documentation

## 2014-08-18 DIAGNOSIS — Z8669 Personal history of other diseases of the nervous system and sense organs: Secondary | ICD-10-CM | POA: Insufficient documentation

## 2014-08-18 DIAGNOSIS — R51 Headache: Secondary | ICD-10-CM | POA: Insufficient documentation

## 2014-08-18 DIAGNOSIS — Z8701 Personal history of pneumonia (recurrent): Secondary | ICD-10-CM | POA: Insufficient documentation

## 2014-08-18 DIAGNOSIS — R519 Headache, unspecified: Secondary | ICD-10-CM

## 2014-08-18 DIAGNOSIS — R0789 Other chest pain: Secondary | ICD-10-CM | POA: Insufficient documentation

## 2014-08-18 DIAGNOSIS — R42 Dizziness and giddiness: Secondary | ICD-10-CM | POA: Insufficient documentation

## 2014-08-18 DIAGNOSIS — R0602 Shortness of breath: Secondary | ICD-10-CM | POA: Insufficient documentation

## 2014-08-18 LAB — CBC
HEMATOCRIT: 46.8 % (ref 39.0–52.0)
HEMOGLOBIN: 15.8 g/dL (ref 13.0–17.0)
MCH: 28.8 pg (ref 26.0–34.0)
MCHC: 33.8 g/dL (ref 30.0–36.0)
MCV: 85.4 fL (ref 78.0–100.0)
Platelets: 245 10*3/uL (ref 150–400)
RBC: 5.48 MIL/uL (ref 4.22–5.81)
RDW: 13.5 % (ref 11.5–15.5)
WBC: 7.3 10*3/uL (ref 4.0–10.5)

## 2014-08-18 LAB — BASIC METABOLIC PANEL
ANION GAP: 5 (ref 5–15)
BUN: 13 mg/dL (ref 6–20)
CO2: 24 mmol/L (ref 22–32)
Calcium: 8.6 mg/dL — ABNORMAL LOW (ref 8.9–10.3)
Chloride: 108 mmol/L (ref 101–111)
Creatinine, Ser: 1.2 mg/dL (ref 0.61–1.24)
GFR calc Af Amer: >60 mL/min (ref >60)
GFR calc non Af Amer: >60 mL/min (ref >60)
GLUCOSE: 105 mg/dL — AB (ref 65–99)
POTASSIUM: 4.2 mmol/L (ref 3.5–5.1)
Sodium: 137 mmol/L (ref 135–145)

## 2014-08-18 LAB — I-STAT TROPONIN, ED: Troponin i, poc: 0 ng/mL (ref 0.00–0.08)

## 2014-08-18 MED ORDER — KETOROLAC TROMETHAMINE 60 MG/2ML IM SOLN
60.0000 mg | Freq: Once | INTRAMUSCULAR | Status: AC
  Administered 2014-08-18: 60 mg via INTRAMUSCULAR
  Filled 2014-08-18: qty 2

## 2014-08-18 NOTE — ED Notes (Signed)
Patient states he chest pain with some shortness of breathe.  He had some tightness in his chest this morning but it subsided.  Symptoms lasted for several hours (10:00 pm to 5:00 am) t).  Denies any N/V or diarrhea.  He thought it was a migraine and took Tylenol for the pain but no relief.  He is complaining of a headache posteriorly.

## 2014-08-18 NOTE — ED Notes (Signed)
Pt had no adverse reaction to IM med.

## 2014-08-18 NOTE — ED Provider Notes (Signed)
CSN: [id number]     Arrival date & time 08/18/14  1510 History   First MD Initiated Contact with Patient 08/18/14 1653     Chief Complaint  Patient presents with  . Chest Pain  . Headache     (Consider location/radiation/quality/duration/timing/severity/associated sxs/prior Treatment) Patient is a 39 y.o. male presenting with chest pain and headaches.  Chest Pain Pain location:  Substernal area Pain quality: tightness   Pain radiates to:  Does not radiate Pain radiates to the back: no   Pain severity:  Severe Onset quality:  Gradual Duration: 10PM to 5AM. Timing:  Constant Progression:  Resolved Chronicity:  New Context: at rest   Relieved by:  Leaning forward (deep breaths) Worsened by:  Certain positions Ineffective treatments:  None tried (tylenol, drinking water) Associated symptoms: cough (improving), fatigue, headache (4/10) and shortness of breath   Associated symptoms: no abdominal pain, no back pain, no diaphoresis, no fever, no nausea, no numbness, no syncope, not vomiting and no weakness   Headaches:    Severity: ranges from light to heavy.   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 while in high school, no hx of sudden death in family Headache Associated symptoms: congestion (improving), cough (improving) and fatigue   Associated symptoms: no abdominal pain, no back pain, no diarrhea, no fever, no nausea, no neck stiffness, no numbness, no sore throat, no syncope, no vomiting and no weakness     Past Medical History  Diagnosis Date  . Medial  meniscus tear 01/2012    right knee  . Pneumonia   . Vertigo, benign paroxysmal 2015   Past Surgical History  Procedure Laterality Date  . Corneal transplant  12/2009  . Knee arthroscopy  02/20/2012    Procedure: ARTHROSCOPY KNEE;  Surgeon: Alta Corning, MD;  Location: [facility];  Service: Orthopedics;  Laterality: Right;  Chondroplasia patella femoral joint, medial meniscal debriedment   Family History  Problem Relation Age of Onset  . Asthma Mother   . Diabetes Father   . Cancer Father     prostate  . Osteoarthritis Sister   . Diabetes Sister    History  Substance Use Topics  . Smoking status: Never Smoker   . Smokeless tobacco: Never Used  . Alcohol Use: No    Review of Systems  Constitutional: Positive for fatigue. Negative for fever and diaphoresis.  HENT: Positive for congestion (improving). Negative for sore throat.   Eyes: Negative for visual disturbance.  Respiratory: Positive for cough (improving) and shortness of breath.   Cardiovascular: Positive for chest pain. Negative for syncope.  Gastrointestinal: Negative for nausea, vomiting, abdominal pain, diarrhea and constipation.  Genitourinary: Negative for difficulty urinating.  Musculoskeletal: Negative for back pain and neck stiffness.  Skin: Negative for rash.  Neurological: Positive for light-headedness and headaches (4/10). Negative for syncope, facial asymmetry, speech difficulty, weakness and numbness.      Allergies  Review of patient's allergies indicates no known allergies.  Home Medications   Prior to Admission medications   Medication Sig Start Date End Date  Taking? Authorizing Provider  acetaminophen (TYLENOL) 325 MG tablet Take 650 mg by mouth every 6 (six) hours as needed for moderate pain.   Yes Historical Provider, MD  meclizine (ANTIVERT) 25 MG tablet Take 1 tablet (25 mg total) by mouth 3 (three) times daily as needed for dizziness. 08/15/14  Yes Everlene Balls, MD  prednisoLONE  acetate (PRED FORTE) 1 % ophthalmic suspension Place 2 drops into the left eye 2 (two) times daily. 07/25/14  Yes Patrecia Pour, MD  HYDROcodone-acetaminophen (NORCO) 5-325 MG per tablet Take 2 tablets by mouth every 4 (four) hours as needed. Patient not taking: Reported on 08/18/2014 04/23/14   [name], PA-C  ibuprofen (ADVIL,MOTRIN) 800 MG tablet Take 1 tablet (800 mg total) by mouth 3 (three) times daily. Patient not taking: Reported on 08/18/2014 06/15/14   Evelina Bucy, MD  naproxen (NAPROSYN) 500 MG tablet Take 1 tablet (500 mg total) by mouth 2 (two) times daily. Patient not taking: Reported on 08/18/2014 04/23/14   [name], PA-C  tamsulosin (FLOMAX) 0.4 MG CAPS capsule Take 1 capsule (0.4 mg total) by mouth 2 (two) times daily. Patient not taking: Reported on 08/18/2014 04/23/14   [name], PA-C   BP 122/64 mmHg  Pulse 83  Temp(Src) 98.2 F (36.8 C) (Oral)  Resp 18  Ht 5\' 8"  (1.727 m)  Wt 265 lb (120.203 kg)  BMI 40.30 kg/m2  SpO2 98% Physical Exam  Constitutional: He is oriented to person, place, and time. He appears well-developed and well-nourished. No distress.  HENT:  Head: Normocephalic and atraumatic.  Eyes: Conjunctivae and EOM are normal.  Neck: Normal range of motion.  Cardiovascular: Normal rate, regular rhythm, normal heart sounds and intact distal pulses.  Exam reveals no gallop and no friction rub.   No murmur heard. Pulmonary/Chest: Effort normal and breath sounds normal. No respiratory distress. He has no wheezes. He has no rales. He exhibits tenderness (subseternal and posterior left ribs).  Abdominal: Soft. He exhibits no distension. There is no tenderness. There is no guarding.  Musculoskeletal: He exhibits no edema.  Neurological: He is alert and oriented to person, place, and time. No cranial nerve deficit or sensory deficit. He displays a negative Romberg sign. Coordination and gait normal. GCS eye subscore is 4. GCS verbal subscore is 5. GCS motor  subscore is 6.  Skin: Skin is warm and dry. He is not diaphoretic.  Nursing note and vitals reviewed.   ED Course  Procedures (including critical care time) Labs Review Labs Reviewed  BASIC METABOLIC PANEL - Abnormal; Notable for the following:    Glucose, Bld 105 (*)    Calcium 8.6 (*)    All other components within normal limits  CBC  I-STAT TROPOININ, ED    Imaging Review Dg Chest 2 View  08/18/2014   CLINICAL DATA:  Headaches since Saturday, mid chest pain and tightness with shortness of breast since last night  EXAM: CHEST  2 VIEW  COMPARISON:  08/14/2014  FINDINGS: Enlargement of cardiac silhouette.  Mediastinal contours and pulmonary vascularity normal.  Lungs clear.  No pleural effusion or pneumothorax.  Bones unremarkable.  IMPRESSION: Enlargement of cardiac silhouette.  No acute abnormalities.   Electronically Signed   By: Lavonia Dana M.D.   On: 08/18/2014 16:02     EKG Interpretation   Date/Time:  Monday August 18 2014 15:17:38 EDT Ventricular Rate:  94 PR Interval:  148 QRS Duration: 86 QT Interval:  336 QTC Calculation: 420 R Axis:   -  53 Text Interpretation:  Sinus rhythm Left anterior fascicular block Abnormal  R-wave progression, late transition since last tracing no significant  change Confirmed by BELFI  MD, MELANIE ([id number]) on 08/18/2014 3:42:06 PM      MDM   Final diagnoses:  Other chest pain  Acute nonintractable headache, unspecified headache type  Muscular chest pain   39 year old male with history of OSA presents with concern of chest pain. Differential diagnosis for chest pain includes pulmonary embolus, dissection, pneumothorax, pneumonia, ACS, myocarditis, pericarditis.  EKG was done and evaluate by me and showed no acute ST changes and no signs of pericarditis. Chest x-ray was done and evaluated by me and radiology and showed enlarged heart similar to prior, and no sign of pneumonia or pneumothorax. Patient is PERC negative and low risk [place] and  have low suspicion for PE.  Patient is low risk HEART score and had single troponin which was negative nearly 12 hours after resolution of chest pain. Given this evaluation, history and physical have low suspicion for pulmonary embolus, pneumonia, ACS, myocarditis, pericarditis, dissection. Pt with history of significant coughing recently and likely with musculoskeletal cause of chest pain, however recommend close PCP follow up.   Regarding headache, it began slowly, no trauma, no fevers, and normal neurologic exam and have low suspicion for [facility], SDH or meningitis.  Patient was given Toradol.  Patient discharged in stable condition with understanding of reasons to return and recommend PCP follow up.      Gareth Morgan, MD 08/19/14 0201

## 2014-08-18 NOTE — ED Notes (Signed)
Awake. Verbally responsive. A/O x4. Resp even and unlabored. No audible adventitious breath sounds noted. ABC's intact.  

## 2014-08-18 NOTE — ED Notes (Signed)
Awake. Verbally responsive. A/O x4. Resp even and unlabored. No audible adventitious breath sounds noted. ABC's intact. SR on monitor. Family at bedside. 

## 2014-08-18 NOTE — ED Notes (Signed)
Awake. Verbally responsive. A/O x4. Resp even and unlabored. No audible adventitious breath sounds noted. ABC's intact. SR on monitor. 

## 2014-10-10 ENCOUNTER — Encounter (HOSPITAL_BASED_OUTPATIENT_CLINIC_OR_DEPARTMENT_OTHER): Payer: Self-pay | Admitting: *Deleted

## 2014-10-10 ENCOUNTER — Emergency Department (HOSPITAL_BASED_OUTPATIENT_CLINIC_OR_DEPARTMENT_OTHER)
Admission: EM | Admit: 2014-10-10 | Discharge: 2014-10-10 | Disposition: A | Discharge status: Home / Self Care | Payer: Self-pay | Attending: Emergency Medicine | Admitting: Emergency Medicine

## 2014-10-10 DIAGNOSIS — Z87448 Personal history of other diseases of urinary system: Secondary | ICD-10-CM | POA: Insufficient documentation

## 2014-10-10 DIAGNOSIS — M549 Dorsalgia, unspecified: Secondary | ICD-10-CM

## 2014-10-10 DIAGNOSIS — Z8701 Personal history of pneumonia (recurrent): Secondary | ICD-10-CM | POA: Insufficient documentation

## 2014-10-10 DIAGNOSIS — M545 Low back pain: Secondary | ICD-10-CM | POA: Insufficient documentation

## 2014-10-10 DIAGNOSIS — Z87828 Personal history of other (healed) physical injury and trauma: Secondary | ICD-10-CM | POA: Insufficient documentation

## 2014-10-10 DIAGNOSIS — Z8669 Personal history of other diseases of the nervous system and sense organs: Secondary | ICD-10-CM | POA: Insufficient documentation

## 2014-10-10 DIAGNOSIS — Z7952 Long term (current) use of systemic steroids: Secondary | ICD-10-CM | POA: Insufficient documentation

## 2014-10-10 HISTORY — DX: Disorder of kidney and ureter, unspecified: N28.9

## 2014-10-10 MED ORDER — METHOCARBAMOL 500 MG PO TABS: 1000.0000 mg | ORAL_TABLET | Freq: Three times a day (TID) | ORAL | Status: DC | PRN

## 2014-10-10 MED ORDER — IBUPROFEN 600 MG PO TABS: 600.0000 mg | ORAL_TABLET | Freq: Three times a day (TID) | ORAL | Status: DC | PRN

## 2014-10-10 MED ORDER — IBUPROFEN 400 MG PO TABS
400.0000 mg | ORAL_TABLET | Freq: Once | ORAL | Status: AC
  Administered 2014-10-10: 400 mg via ORAL
  Filled 2014-10-10: qty 1

## 2014-10-10 NOTE — ED Notes (Signed)
Pt c/o left flank pain that began yesterday. Pt has had kidney stones before and sts this feels similar.

## 2014-10-10 NOTE — Discharge Instructions (Signed)
It was our pleasure to provide your ER care today - we hope that you feel better.  Rest. Avoid bending at waist, or heavy lifting > 20 lbs for the next few days.  Heat to sore area.   Take motrin as need for pain. You may take robaxin (muscle relaxer) as need - no driving when taking.  Follow up with primary care doctor in 1 week if symptoms fail to improve/resolve.  Return to ER if worse, new symptoms, fevers, intractable pain, numbness/weakness, blood in urine or trouble urinating, other concern.    Back Pain, Adult Low back pain is very common. About 1 in 5 people have back pain.The cause of low back pain is rarely dangerous. The pain often gets better over time.About half of people with a sudden onset of back pain feel better in just 2 weeks. About 8 in 10 people feel better by 6 weeks.  CAUSES Some common causes of back pain include:  Strain of the muscles or ligaments supporting the spine.  Wear and tear (degeneration) of the spinal discs.  Arthritis.  Direct injury to the back. DIAGNOSIS Most of the time, the direct cause of low back pain is not known.However, back pain can be treated effectively even when the exact cause of the pain is unknown.Answering your caregiver's questions about your overall health and symptoms is one of the most accurate ways to make sure the cause of your pain is not dangerous. If your caregiver needs more information, he or she may order lab work or imaging tests (X-rays or MRIs).However, even if imaging tests show changes in your back, this usually does not require surgery. HOME CARE INSTRUCTIONS For many people, back pain returns.Since low back pain is rarely dangerous, it is often a condition that people can learn to [facility] their own.   Remain active. It is stressful on the back to sit or stand in one place. Do not sit, drive, or stand in one place for more than 30 minutes at a time. Take short walks on level surfaces as soon as pain  allows.Try to increase the length of time you walk each day.  Do not stay in bed.Resting more than 1 or 2 days can delay your recovery.  Do not avoid exercise or work.Your body is made to move.It is not dangerous to be active, even though your back may hurt.Your back will likely heal faster if you return to being active before your pain is gone.  Pay attention to your body when you bend and lift. Many people have less discomfortwhen lifting if they bend their knees, keep the load close to their bodies,and avoid twisting. Often, the most comfortable positions are those that put less stress on your recovering back.  Find a comfortable position to sleep. Use a firm mattress and lie on your side with your knees slightly bent. If you lie on your back, put a pillow under your knees.  Only take over-the-counter or prescription medicines as directed by your caregiver. Over-the-counter medicines to reduce pain and inflammation are often the most helpful.Your caregiver may prescribe muscle relaxant drugs.These medicines help dull your pain so you can more quickly return to your normal activities and healthy exercise.  Put ice on the injured area.  Put ice in a plastic bag.  Place a towel between your skin and the bag.  Leave the ice on for 15-20 minutes, 03-04 times a day for the first 2 to 3 days. After that, ice and heat may  be alternated to reduce pain and spasms.  Ask your caregiver about trying back exercises and gentle massage. This may be of some benefit.  Avoid feeling anxious or stressed.Stress increases muscle tension and can worsen back pain.It is important to recognize when you are anxious or stressed and learn ways to manage it.Exercise is a great option. SEEK MEDICAL CARE IF:  You have pain that is not relieved with rest or medicine.  You have pain that does not improve in 1 week.  You have new symptoms.  You are generally not feeling well. SEEK IMMEDIATE MEDICAL CARE  IF:   You have pain that radiates from your back into your legs.  You develop new bowel or bladder control problems.  You have unusual weakness or numbness in your arms or legs.  You develop nausea or vomiting.  You develop abdominal pain.  You feel faint. Document Released: 01/03/2005 Document Revised: 07/05/2011 Document Reviewed: 05/07/2013 [facility] Patient Information 2015 [place], [place]. This information is not intended to replace advice given to you by your health care provider. Make sure you discuss any questions you have with your health care provider.     Back Injury Prevention Back injuries can be extremely painful and difficult to heal. After having one back injury, you are much more likely to experience another later on. It is important to learn how to avoid injuring or re-injuring your back. The following tips can help you to prevent a back injury. PHYSICAL FITNESS  Exercise regularly and try to develop good tone in your abdominal muscles. Your abdominal muscles provide a lot of the support needed by your back.  Do aerobic exercises (walking, jogging, biking, swimming) regularly.  Do exercises that increase balance and strength (tai chi, yoga) regularly. This can decrease your risk of falling and injuring your back.  Stretch before and after exercising.  Maintain a healthy weight. The more you weigh, the more stress is placed on your back. For every pound of weight, 10 times that amount of pressure is placed on the back. DIET  Talk to your caregiver about how much calcium and vitamin D you need per day. These nutrients help to prevent weakening of the bones (osteoporosis). Osteoporosis can cause broken (fractured) bones that lead to back pain.  Include good sources of calcium in your diet, such as dairy products, green, leafy vegetables, and products with calcium added (fortified).  Include good sources of vitamin D in your diet, such as milk and foods that are  fortified with vitamin D.  Consider taking a nutritional supplement or a multivitamin if needed.  Stop smoking if you smoke. POSTURE  Sit and stand up straight. Avoid leaning forward when you sit or hunching over when you stand.  Choose chairs with good low back (lumbar) support.  If you work at a desk, sit close to your work so you do not need to lean over. Keep your chin tucked in. Keep your neck drawn back and elbows bent at a right angle. Your arms should look like the letter "L."  Sit high and close to the steering wheel when you drive. Add a lumbar support to your car seat if needed.  Avoid sitting or standing in one position for too long. Take breaks to get up, stretch, and walk around at least once every hour. Take breaks if you are driving for long periods of time.  Sleep on your side with your knees slightly bent, or sleep on your back with a pillow under  your knees. Do not sleep on your stomach. LIFTING, TWISTING, AND REACHING  Avoid heavy lifting, especially repetitive lifting. If you must do heavy lifting:  Stretch before lifting.  Work slowly.  Rest between lifts.  Use carts and dollies to move objects when possible.  Make several small trips instead of carrying 1 heavy load.  Ask for help when you need it.  Ask for help when moving big, awkward objects.  Follow these steps when lifting:  Stand with your feet shoulder-width apart.  Get as close to the object as you can. Do not try to pick up heavy objects that are far from your body.  Use handles or lifting straps if they are available.  Bend at your knees. Squat down, but keep your heels off the floor.  Keep your shoulders pulled back, your chin tucked in, and your back straight.  Lift the object slowly, tightening the muscles in your legs, abdomen, and buttocks. Keep the object as close to the center of your body as possible.  When you put a load down, use these same guidelines in reverse.  Do  not:  Lift the object above your waist.  Twist at the waist while lifting or carrying a load. Move your feet if you need to turn, not your waist.  Bend over without bending at your knees.  Avoid reaching over your head, across a table, or for an object on a high surface. OTHER TIPS  Avoid wet floors and keep sidewalks clear of ice to prevent falls.  Do not sleep on a mattress that is too soft or too hard.  Keep items that are used frequently within easy reach.  Put heavier objects on shelves at waist level and lighter objects on lower or higher shelves.  Find ways to decrease your stress, such as exercise, massage, or relaxation techniques. Stress can build up in your muscles. Tense muscles are more vulnerable to injury.  Seek treatment for depression or anxiety if needed. These conditions can increase your risk of developing back pain. SEEK MEDICAL CARE IF:  You injure your back.  You have questions about diet, exercise, or other ways to prevent back injuries. MAKE SURE YOU:  Understand these instructions.  Will watch your condition.  Will get help right away if you are not doing well or get worse. Document Released: 02/11/2004 Document Revised: 03/28/2011 Document Reviewed: 02/14/2011 [facility] Patient Information 2015 [place], [place]. This information is not intended to replace advice given to you by your health care provider. Make sure you discuss any questions you have with your health care provider.    Heat Therapy Heat therapy can help ease sore, stiff, injured, and tight muscles and joints. Heat relaxes your muscles, which may help ease your pain.  RISKS AND COMPLICATIONS If you have any of the following conditions, do not use heat therapy unless your health care provider has approved:  Poor circulation.  Healing wounds or scarred skin in the area being treated.  Diabetes, heart disease, or high blood pressure.  Not being able to feel (numbness) the area being  treated.  Unusual swelling of the area being treated.  Active infections.  Blood clots.  Cancer.  Inability to communicate pain. This may include young children and people who have problems with their brain function (dementia).  Pregnancy. Heat therapy should only be used on old, pre-existing, or long-lasting (chronic) injuries. Do not use heat therapy on new injuries unless directed by your health care provider. HOW TO USE HEAT  THERAPY There are several different kinds of heat therapy, including:  Moist heat pack.  Warm water bath.  Hot water bottle.  Electric heating pad.  Heated gel pack.  Heated wrap.  Electric heating pad. Use the heat therapy method suggested by your health care provider. Follow your health care provider's instructions on when and how to use heat therapy. GENERAL HEAT THERAPY RECOMMENDATIONS  Do not sleep while using heat therapy. Only use heat therapy while you are awake.  Your skin may turn pink while using heat therapy. Do not use heat therapy if your skin turns red.  Do not use heat therapy if you have new pain.  High heat or long exposure to heat can cause burns. Be careful when using heat therapy to avoid burning your skin.  Do not use heat therapy on areas of your skin that are already irritated, such as with a rash or sunburn. SEEK MEDICAL CARE IF:  You have blisters, redness, swelling, or numbness.  You have new pain.  Your pain is worse. MAKE SURE YOU:  Understand these instructions.  Will watch your condition.  Will get help right away if you are not doing well or get worse. Document Released: 03/28/2011 Document Revised: 05/20/2013 Document Reviewed: 02/26/2013 [facility] Patient Information 2015 [place], [place]. This information is not intended to replace advice given to you by your health care provider. Make sure you discuss any questions you have with your health care provider.

## 2014-10-10 NOTE — ED Provider Notes (Signed)
CSN: [id number]     Arrival date & time 10/10/14  1632 History   First MD Initiated Contact with Patient 10/10/14 1635     Chief Complaint  Patient presents with  . Back Pain     (Consider location/radiation/quality/duration/timing/severity/associated sxs/prior Treatment) Patient is a 39 y.o. male presenting with back pain. The history is provided by the patient.  Back Pain Associated symptoms: no abdominal pain, no chest pain, no dysuria, no fever, no numbness and no weakness   Patient c/o mid to lower back pain, esp on left side, for the past couple days. Symptoms gradual onset, constant, persistent, non radiating, moderate. States similar pain in past, unsure of cause. Denies specific injury, fall or strain. No numbness or weakness. No swelling or skin lesions/rash to area of pain. No redness. No anterior/abdominal pain. No groin or testicle pain. No hematuria or dysuria. No fever or chills. Pain is worse w movement, positional changes, bending, getting in and out of car.       Past Medical History  Diagnosis Date  . Medial meniscus tear 01/2012    right knee  . Pneumonia   . Vertigo, benign paroxysmal 2015  . Renal disorder    Past Surgical History  Procedure Laterality Date  . Corneal transplant  12/2009  . Knee arthroscopy  02/20/2012    Procedure: ARTHROSCOPY KNEE;  Surgeon: Alta Corning, MD;  Location: [place];  Service: Orthopedics;  Laterality: Right;  Chondroplasia patella femoral joint, medial meniscal debriedment   Family History  Problem Relation Age of Onset  . Asthma Mother   . Diabetes Father   . Cancer Father     prostate  . Osteoarthritis Sister   . Diabetes Sister    Social History  Substance Use Topics  . Smoking status: Never Smoker   . Smokeless tobacco: Never Used  . Alcohol Use: No    Review of Systems  Constitutional: Negative for fever and chills.  HENT: Negative for sore throat.   Eyes: Negative for redness.   Respiratory: Negative for shortness of breath.   Cardiovascular: Negative for chest pain and leg swelling.  Gastrointestinal: Negative for nausea, vomiting and abdominal pain.  Genitourinary: Negative for dysuria and hematuria.  Musculoskeletal: Positive for back pain. Negative for neck pain.  Skin: Negative for rash.  Neurological: Negative for weakness and numbness.  Hematological: Does not bruise/bleed easily.  Psychiatric/Behavioral: Negative for confusion.      Allergies  Review of patient's allergies indicates no known allergies.  Home Medications   Prior to Admission medications   Medication Sig Start Date End Date Taking? Authorizing Provider  acetaminophen (TYLENOL) 325 MG tablet Take 650 mg by mouth every 6 (six) hours as needed for moderate pain.    Historical Provider, MD  HYDROcodone-acetaminophen (NORCO) 5-325 MG per tablet Take 2 tablets by mouth every 4 (four) hours as needed. Patient not taking: Reported on 08/18/2014 04/23/14   [name], PA-C  ibuprofen (ADVIL,MOTRIN) 800 MG tablet Take 1 tablet (800 mg total) by mouth 3 (three) times daily. Patient not taking: Reported on 08/18/2014 06/15/14   Evelina Bucy, MD  meclizine (ANTIVERT) 25 MG tablet Take 1 tablet (25 mg total) by mouth 3 (three) times daily as needed for dizziness. 08/15/14   Everlene Balls, MD  naproxen (NAPROSYN) 500 MG tablet Take 1 tablet (500 mg total) by mouth 2 (two) times daily. Patient not taking: Reported on 08/18/2014 04/23/14   [name], PA-C  prednisoLONE acetate (PRED FORTE)  1 % ophthalmic suspension Place 2 drops into the left eye 2 (two) times daily. 07/25/14   Patrecia Pour, MD  tamsulosin (FLOMAX) 0.4 MG CAPS capsule Take 1 capsule (0.4 mg total) by mouth 2 (two) times daily. Patient not taking: Reported on 08/18/2014 04/23/14   [name], PA-C   BP 130/71 mmHg  Pulse 88  Temp(Src) 98.9 F (37.2 C) (Oral)  Resp 16  Ht 5\' 8"  (1.727 m)  Wt 255 lb (115.667 kg)  BMI 38.78 kg/m2   SpO2 100% Physical Exam  Constitutional: He is oriented to person, place, and time. He appears well-developed and well-nourished. No distress.  HENT:  Head: Atraumatic.  Eyes: Conjunctivae are normal. No scleral icterus.  Neck: Neck supple. No tracheal deviation present.  Cardiovascular: Normal rate, regular rhythm, normal heart sounds and intact distal pulses.   Pulmonary/Chest: Effort normal and breath sounds normal. No accessory muscle usage. No respiratory distress.  Abdominal: Soft. Bowel sounds are normal. He exhibits no distension and no mass. There is no tenderness. There is no rebound and no guarding.  Genitourinary:  No cva tenderness. No testicular or scrotal pain.   Musculoskeletal: Normal range of motion.  TLS spine non tender, aligned. Left upper lumbar muscular tenderness. No sts or skin changes.   Neurological: He is alert and oriented to person, place, and time.  Ambulates w steady gait.   Skin: Skin is warm and dry. No rash noted. He is not diaphoretic.  Psychiatric: He has a normal mood and affect.  Nursing note and vitals reviewed.   ED Course  Procedures (including critical care time) Labs Review   MDM   No meds pta. Pt drove self to ED.  Confirmed nkda.  Motrin po.  History and exam currently felt most c/w musculoskeletal pain.  Will rx nsaid, muscle relaxer. pcp f/u.     Lajean Saver, MD 10/10/14 [phone]

## 2014-10-10 NOTE — ED Notes (Signed)
MD at bedside. 

## 2014-10-27 ENCOUNTER — Ambulatory Visit (INDEPENDENT_AMBULATORY_CARE_PROVIDER_SITE_OTHER): Payer: Self-pay | Admitting: Family Medicine

## 2014-10-27 ENCOUNTER — Telehealth: Payer: Self-pay

## 2014-10-27 ENCOUNTER — Encounter: Payer: Self-pay | Admitting: Family Medicine

## 2014-10-27 VITALS — BP 136/95 | HR 94 | Temp 98.5°F | Ht 68.0 in | Wt 260.0 lb

## 2014-10-27 DIAGNOSIS — R109 Unspecified abdominal pain: Secondary | ICD-10-CM

## 2014-10-27 NOTE — Patient Instructions (Signed)
Thank you for coming to see me today. It was a pleasure. Today we talked about:   Left flank pain: I will get an ultrasound to see if your spleen looks okay. This will also look at your kidney.  If you have any questions or concerns, please do not hesitate to call the office at [phone].  Sincerely,  Cordelia Poche, MD

## 2014-10-27 NOTE — Telephone Encounter (Signed)
Talked to patient and informed him of his Ultrasound at [facility] on the 1st floor radiology department Nov 05, 2014/ 0800. Show time is 0745 with NPO 6 hours prior to appointment. Patient acknowledges understanding .Ozella Almond

## 2014-10-27 NOTE — Progress Notes (Signed)
    Subjective   Derrick Scott is a 39 y.o. male that presents for a same day visit  1. Left flank pain: Symptoms started a few years ago. Symptoms have been getting worse. Sometimes breathing and certain movements (including right back rotation) makes his symptoms worse. Sometimes he has pain without movements as well. He works as a [employer] certified accountant. He performs a lot of twisting motions while working. No coughing, fevers, unexplained weight loss. No recent trauma. No dysuria, nausea or vomiting.  ROS Per HPI  Social History  Substance Use Topics  . Smoking status: Never Smoker   . Smokeless tobacco: Never Used  . Alcohol Use: No    No Known Allergies  Objective   BP 136/95 mmHg  Pulse 94  Temp(Src) 98.5 F (36.9 C) (Oral)  Ht 5\' 8"  (1.727 m)  Wt 260 lb (117.935 kg)  BMI 39.54 kg/m2  General: Well appearing, no distress Gastrointestinal: Spleen not palpable, soft, non-distended, mostly non-tender unless I try to bring spleen forward with posterior pressure. No CVA tenderness    Musculoskeletal: No back tenderness  Assessment and Plan   No orders of the defined types were placed in this encounter.    Flank pain Unsure of etiology. History of adrenal adenoma but appears to be right sided. On examination, tenderness when attempting to palpate spleen. Possible related to bowels as well. Patient does have a history of nephrolithiasis but with history, this does not appear to be the case for this presentation. Will obtain limited ultrasound.

## 2014-10-29 ENCOUNTER — Telehealth: Payer: Self-pay | Admitting: Family Medicine

## 2014-10-29 NOTE — Telephone Encounter (Signed)
Pt called and would like an extension on his letter to be out of work. He was suppose to go back today but he stayed home since he is still in pain. Please call when ready to pick up/ jw

## 2014-10-29 NOTE — Telephone Encounter (Signed)
Dr. Lonny Prude saw him for this so up to him to decide about letter Derrick Scott

## 2014-10-30 NOTE — Telephone Encounter (Signed)
Letter printed and available for pickup. 

## 2014-10-30 NOTE — Telephone Encounter (Signed)
PT mother informed of below. Katharina Caper, Letonia Stead D, [place]

## 2014-10-30 NOTE — Assessment & Plan Note (Signed)
Unsure of etiology. History of adrenal adenoma but appears to be right sided. On examination, tenderness when attempting to palpate spleen. Possible related to bowels as well. Patient does have a history of nephrolithiasis but with history, this does not appear to be the case for this presentation. Will obtain limited ultrasound.

## 2014-10-30 NOTE — Telephone Encounter (Signed)
Patient requesting a work note from visit date w/ Dr. Lonny Prude 10/27/14 until 11/03/14. Please advise.

## 2014-11-01 ENCOUNTER — Emergency Department (HOSPITAL_BASED_OUTPATIENT_CLINIC_OR_DEPARTMENT_OTHER): Payer: Self-pay

## 2014-11-01 ENCOUNTER — Emergency Department (HOSPITAL_BASED_OUTPATIENT_CLINIC_OR_DEPARTMENT_OTHER)
Admission: EM | Admit: 2014-11-01 | Discharge: 2014-11-01 | Disposition: A | Discharge status: Home / Self Care | Payer: Self-pay | Attending: Emergency Medicine | Admitting: Emergency Medicine

## 2014-11-01 ENCOUNTER — Encounter (HOSPITAL_BASED_OUTPATIENT_CLINIC_OR_DEPARTMENT_OTHER): Payer: Self-pay | Admitting: *Deleted

## 2014-11-01 DIAGNOSIS — M7918 Myalgia, other site: Secondary | ICD-10-CM

## 2014-11-01 DIAGNOSIS — Z7952 Long term (current) use of systemic steroids: Secondary | ICD-10-CM | POA: Insufficient documentation

## 2014-11-01 DIAGNOSIS — R109 Unspecified abdominal pain: Secondary | ICD-10-CM | POA: Insufficient documentation

## 2014-11-01 DIAGNOSIS — Z87828 Personal history of other (healed) physical injury and trauma: Secondary | ICD-10-CM | POA: Insufficient documentation

## 2014-11-01 DIAGNOSIS — Z8701 Personal history of pneumonia (recurrent): Secondary | ICD-10-CM | POA: Insufficient documentation

## 2014-11-01 DIAGNOSIS — Z87442 Personal history of urinary calculi: Secondary | ICD-10-CM | POA: Insufficient documentation

## 2014-11-01 DIAGNOSIS — Z87448 Personal history of other diseases of urinary system: Secondary | ICD-10-CM | POA: Insufficient documentation

## 2014-11-01 DIAGNOSIS — Z8669 Personal history of other diseases of the nervous system and sense organs: Secondary | ICD-10-CM | POA: Insufficient documentation

## 2014-11-01 DIAGNOSIS — M791 Myalgia: Secondary | ICD-10-CM | POA: Insufficient documentation

## 2014-11-01 LAB — URINALYSIS, ROUTINE W REFLEX MICROSCOPIC
BILIRUBIN URINE: NEGATIVE
Glucose, UA: NEGATIVE mg/dL
HGB URINE DIPSTICK: NEGATIVE
Ketones, ur: NEGATIVE mg/dL
Leukocytes, UA: NEGATIVE
Nitrite: NEGATIVE
PH: 7 (ref 5.0–8.0)
Protein, ur: NEGATIVE mg/dL
SPECIFIC GRAVITY, URINE: 1.034 — AB (ref 1.005–1.030)
UROBILINOGEN UA: 1 mg/dL (ref 0.0–1.0)

## 2014-11-01 MED ORDER — METHOCARBAMOL 500 MG PO TABS
750.0000 mg | ORAL_TABLET | Freq: Once | ORAL | Status: AC
  Administered 2014-11-01: 750 mg via ORAL
  Filled 2014-11-01: qty 2

## 2014-11-01 MED ORDER — METHOCARBAMOL 500 MG PO TABS: 500.0000 mg | ORAL_TABLET | Freq: Two times a day (BID) | ORAL | Status: DC

## 2014-11-01 NOTE — Discharge Instructions (Signed)
For pain control you may take up to 800mg  of Motrin (also known as ibuprofen). That is usually 4 over the counter pills,  3 times a day. Take with food to minimize stomach irritation =  For breakthrough pain you may take Robaxin. Do not drink alcohol, drive or operate heavy machinery when taking Robaxin.  Please follow with your primary care doctor in the next 2 days for a check-up. They must obtain records for further management.   Do not hesitate to return to the Emergency Department for any new, worsening or concerning symptoms.     Muscle Pain, Adult Muscle pain (myalgia) may be caused by many things, including:  Overuse or muscle strain, especially if you are not in shape. This is the most common cause of muscle pain.  Injury.  Bruises.  Viruses, such as the flu.  Infectious diseases.  Fibromyalgia, which is a chronic condition that causes muscle tenderness, fatigue, and headache.  Autoimmune diseases, including lupus.  Certain drugs, including ACE inhibitors and statins. Muscle pain may be mild or severe. In most cases, the pain lasts only a short time and goes away without treatment. To diagnose the cause of your muscle pain, your health care provider will take your medical history. This means he or she will ask you when your muscle pain began and what has been happening. If you have not had muscle pain for very long, your health care provider may want to wait before doing much testing. If your muscle pain has lasted a long time, your health care provider may want to run tests right away. If your health care provider thinks your muscle pain may be caused by illness, you may need to have additional tests to rule out certain conditions.  Treatment for muscle pain depends on the cause. Home care is often enough to relieve muscle pain. Your health care provider may also prescribe anti-inflammatory medicine. HOME CARE INSTRUCTIONS Watch your condition for any changes. The following  actions may help to lessen any discomfort you are feeling:  Only take over-the-counter or prescription medicines as directed by your health care provider.  Apply ice to the sore muscle:  Put ice in a plastic bag.  Place a towel between your skin and the bag.  Leave the ice on for 15-20 minutes, 3-4 times a day.  You may alternate applying hot and cold packs to the muscle as directed by your health care provider.  If overuse is causing your muscle pain, slow down your activities until the pain goes away.  Remember that it is normal to feel some muscle pain after starting a workout program. Muscles that have not been used often will be sore at first.  Do regular, gentle exercises if you are not usually active.  Warm up before exercising to lower your risk of muscle pain.  Do not continue working out if the pain is very bad. Bad pain could mean you have injured a muscle. SEEK MEDICAL CARE IF:  Your muscle pain gets worse, and medicines do not help.  You have muscle pain that lasts longer than 3 days.  You have a rash or fever along with muscle pain.  You have muscle pain after a tick bite.  You have muscle pain while working out, even though you are in good physical condition.  You have redness, soreness, or swelling along with muscle pain.  You have muscle pain after starting a new medicine or changing the dose of a medicine. [place]  CARE IF:  You have trouble breathing.  You have trouble swallowing.  You have muscle pain along with a stiff neck, fever, and vomiting.  You have severe muscle weakness or cannot move part of your body. MAKE SURE YOU:   Understand these instructions.  Will watch your condition.  Will get help right away if you are not doing well or get worse.   This information is not intended to replace advice given to you by your health care provider. Make sure you discuss any questions you have with your health care provider.     Document Released: 11/25/2005 Document Revised: 01/24/2014 Document Reviewed: 10/30/2012 Elsevier Interactive Patient Education [employer].

## 2014-11-01 NOTE — ED Notes (Signed)
Patient transported to CT 

## 2014-11-01 NOTE — ED Provider Notes (Signed)
CSN: [id number]     Arrival date & time 11/01/14  1755 History   First MD Initiated Contact with Patient 11/01/14 1819     Chief Complaint  Patient presents with  . Flank Pain     (Consider location/radiation/quality/duration/timing/severity/associated sxs/prior Treatment) HPI  Blood pressure 121/78, pulse 95, temperature 99 F (37.2 C), temperature source Oral, resp. rate 20, height 5\' 8"  (1.727 m), weight 253 lb (114.76 kg), SpO2 100 %.  Derrick Scott is a 39 y.o. male complaining of left flank pain worsening over the week. Patient states that the pain is severe and exacerbated by movement and palpation. States that he has had kidney stones in the past and this feels similar. He denies trauma, dysuria, hematuria, fever, chills, nausea, vomiting, abdominal pain, testicular pain or swelling. States that he saw his primary care physician who had arranged a ultrasound for this patient on the 19th. Taking Motrin at home with little relief.   Past Medical History  Diagnosis Date  . Medial meniscus tear 01/2012    right knee  . Pneumonia   . Vertigo, benign paroxysmal 2015  . Renal disorder    Past Surgical History  Procedure Laterality Date  . Corneal transplant  12/2009  . Knee arthroscopy  02/20/2012    Procedure: ARTHROSCOPY KNEE;  Surgeon: Alta Corning, MD;  Location: [place];  Service: Orthopedics;  Laterality: Right;  Chondroplasia patella femoral joint, medial meniscal debriedment   Family History  Problem Relation Age of Onset  . Asthma Mother   . Diabetes Father   . Cancer Father     prostate  . Osteoarthritis Sister   . Diabetes Sister    Social History  Substance Use Topics  . Smoking status: Never Smoker   . Smokeless tobacco: Never Used  . Alcohol Use: No    Review of Systems  10 systems reviewed and found to be negative, except as noted in the HPI.   Allergies  Review of patient's allergies indicates no known allergies.  Home  Medications   Prior to Admission medications   Medication Sig Start Date End Date Taking? Authorizing Provider  ibuprofen (ADVIL,MOTRIN) 600 MG tablet Take 1 tablet (600 mg total) by mouth every 8 (eight) hours as needed. Take with food. 10/10/14  Yes Lajean Saver, MD  acetaminophen (TYLENOL) 325 MG tablet Take 650 mg by mouth every 6 (six) hours as needed for moderate pain.    Historical Provider, MD  HYDROcodone-acetaminophen (NORCO) 5-325 MG per tablet Take 2 tablets by mouth every 4 (four) hours as needed. Patient not taking: Reported on 08/18/2014 04/23/14   [name], PA-C  meclizine (ANTIVERT) 25 MG tablet Take 1 tablet (25 mg total) by mouth 3 (three) times daily as needed for dizziness. 08/15/14   Everlene Balls, MD  methocarbamol (ROBAXIN) 500 MG tablet Take 1 tablet (500 mg total) by mouth 2 (two) times daily. 11/01/14   Kreed Kauffman, PA-C  prednisoLONE acetate (PRED FORTE) 1 % ophthalmic suspension Place 2 drops into the left eye 2 (two) times daily. 07/25/14   Patrecia Pour, MD  tamsulosin (FLOMAX) 0.4 MG CAPS capsule Take 1 capsule (0.4 mg total) by mouth 2 (two) times daily. Patient not taking: Reported on 08/18/2014 04/23/14   [name], PA-C   BP 121/78 mmHg  Pulse 95  Temp(Src) 99 F (37.2 C) (Oral)  Resp 20  Ht 5\' 8"  (1.727 m)  Wt 253 lb (114.76 kg)  BMI 38.48 kg/m2  SpO2  100% Physical Exam  Constitutional: He is oriented to person, place, and time. He appears well-developed and well-nourished. No distress.  HENT:  Head: Normocephalic and atraumatic.  Mouth/Throat: Oropharynx is clear and moist.  Eyes: Conjunctivae and EOM are normal. Pupils are equal, round, and reactive to light.  Neck: Normal range of motion.  Cardiovascular: Normal rate, regular rhythm and intact distal pulses.   Pulmonary/Chest: Effort normal and breath sounds normal.  Abdominal: Soft. There is no tenderness.  Genitourinary:  No focal CVA tenderness to percussion. When patient sits up from  a lying position pain is exacerbated  Musculoskeletal: Normal range of motion.  Neurological: He is alert and oriented to person, place, and time.  Skin: He is not diaphoretic.  Psychiatric: He has a normal mood and affect.  Nursing note and vitals reviewed.   ED Course  Procedures (including critical care time) Labs Review Labs Reviewed  URINALYSIS, ROUTINE W REFLEX MICROSCOPIC (NOT AT [facility]) - Abnormal; Notable for the following:    Specific Gravity, Urine 1.034 (*)    All other components within normal limits    Imaging Review Ct Renal Stone Study  11/01/2014  CLINICAL DATA:  Left flank pain for several days.  Nephrolithiasis. EXAM: CT ABDOMEN AND PELVIS WITHOUT CONTRAST TECHNIQUE: Multidetector CT imaging of the abdomen and pelvis was performed following the standard protocol without IV contrast. COMPARISON:  None. FINDINGS: Lower chest:  No acute findings. Hepatobiliary: No mass visualized on this un-enhanced exam. Pancreas: No mass or inflammatory process identified on this un-enhanced exam. Spleen: Within normal limits in size. Adrenals/Urinary Tract: No evidence of urolithiasis or hydronephrosis. Small right adrenal nodule measuring 1.4 cm is stable and consistent with small adrenal adenoma. Stomach/Bowel: No evidence of obstruction, inflammatory process, or abnormal fluid collections. Vascular/Lymphatic: No pathologically enlarged lymph nodes. No evidence of abdominal aortic aneurysm. Reproductive: No mass or other significant abnormality. Other: None. Musculoskeletal:  No suspicious bone lesions identified. IMPRESSION: No evidence of urolithiasis, hydronephrosis, or other acute findings. Stable small right adrenal nodule, consistent with benign adenoma. Electronically Signed   By: Earle Gell M.D.   On: 11/01/2014 19:05   I have personally reviewed and evaluated these images and lab results as part of my medical decision-making.   EKG Interpretation None      MDM   Final  diagnoses:  Left flank pain  Musculoskeletal pain    Filed Vitals:   11/01/14 1804  BP: 121/78  Pulse: 95  Temp: 99 F (37.2 C)  TempSrc: Oral  Resp: 20  Height: 5\' 8"  (1.727 m)  Weight: 253 lb (114.76 kg)  SpO2: 100%    Medications  methocarbamol (ROBAXIN) tablet 750 mg (750 mg Oral Given 11/01/14 1843)    Derrick Scott is 39 y.o. male presenting with severe left flank pain, worse with movement. Patient seems very calm, doubt this is renal however patient states he's had kidney stones in the past and this feels similar. UA with no abnormality, renal study with no abnormality. Likely musculoskeletal pain, will write for Robaxin and advised close follow-up with primary care physician  Evaluation does not show pathology that would require ongoing emergent intervention or inpatient treatment. Pt is hemodynamically stable and mentating appropriately. Discussed findings and plan with patient/guardian, who agrees with care plan. All questions answered. Return precautions discussed and outpatient follow up given.   New Prescriptions   METHOCARBAMOL (ROBAXIN) 500 MG TABLET    Take 1 tablet (500 mg total) by mouth 2 (two) times daily.  Monico Blitz, PA-C 11/01/14 [place], MD 11/01/14 [phone]

## 2014-11-01 NOTE — ED Notes (Signed)
Pt reports left flank pain x 1 week- has seen PCP and has an ultrasound ordered for next week- states pain is intermittent- denies injury- pain worse since yesterday, worse with movement

## 2014-11-05 ENCOUNTER — Other Ambulatory Visit: Payer: Self-pay | Admitting: Family Medicine

## 2014-11-05 ENCOUNTER — Ambulatory Visit (HOSPITAL_COMMUNITY)
Admission: RE | Admit: 2014-11-05 | Discharge: 2014-11-05 | Disposition: A | Discharge status: Home / Self Care | Payer: Self-pay | Source: Ambulatory Visit | Attending: Family Medicine | Admitting: Family Medicine

## 2014-11-05 DIAGNOSIS — R109 Unspecified abdominal pain: Secondary | ICD-10-CM

## 2014-11-06 ENCOUNTER — Telehealth: Payer: Self-pay | Admitting: Family Medicine

## 2014-11-06 NOTE — Telephone Encounter (Signed)
Discussed results of ultrasound. Patient still having symptoms, however, he is still having some pain, especially with bending down. States that his job does not have "light duty" and therefore he cannot perform his duties. Discussed calling office for an appointment to be seen to follow-up on this. Symptoms appear musculoskeletal. Patient likely needs continued rehabilitation.

## 2015-01-22 ENCOUNTER — Encounter (HOSPITAL_COMMUNITY): Payer: Self-pay | Admitting: Emergency Medicine

## 2015-01-22 ENCOUNTER — Emergency Department (HOSPITAL_COMMUNITY)
Admission: EM | Admit: 2015-01-22 | Discharge: 2015-01-22 | Disposition: A | Discharge status: Home / Self Care | Payer: Self-pay | Attending: Emergency Medicine | Admitting: Emergency Medicine

## 2015-01-22 ENCOUNTER — Emergency Department (HOSPITAL_COMMUNITY): Payer: Self-pay

## 2015-01-22 DIAGNOSIS — J111 Influenza due to unidentified influenza virus with other respiratory manifestations: Secondary | ICD-10-CM | POA: Insufficient documentation

## 2015-01-22 DIAGNOSIS — Z79899 Other long term (current) drug therapy: Secondary | ICD-10-CM | POA: Insufficient documentation

## 2015-01-22 DIAGNOSIS — Z87828 Personal history of other (healed) physical injury and trauma: Secondary | ICD-10-CM | POA: Insufficient documentation

## 2015-01-22 DIAGNOSIS — Z7952 Long term (current) use of systemic steroids: Secondary | ICD-10-CM | POA: Insufficient documentation

## 2015-01-22 DIAGNOSIS — Z87448 Personal history of other diseases of urinary system: Secondary | ICD-10-CM | POA: Insufficient documentation

## 2015-01-22 DIAGNOSIS — Z8669 Personal history of other diseases of the nervous system and sense organs: Secondary | ICD-10-CM | POA: Insufficient documentation

## 2015-01-22 DIAGNOSIS — Z8701 Personal history of pneumonia (recurrent): Secondary | ICD-10-CM | POA: Insufficient documentation

## 2015-01-22 MED ORDER — BENZONATATE 100 MG PO CAPS: 100.0000 mg | ORAL_CAPSULE | Freq: Three times a day (TID) | ORAL | Status: DC | PRN

## 2015-01-22 MED ORDER — CETIRIZINE HCL 10 MG PO TABS: 10.0000 mg | ORAL_TABLET | Freq: Every day | ORAL | Status: DC

## 2015-01-22 MED ORDER — NAPROXEN 250 MG PO TABS: 250.0000 mg | ORAL_TABLET | Freq: Two times a day (BID) | ORAL | Status: DC

## 2015-01-22 MED ORDER — ACETAMINOPHEN 500 MG PO TABS
1000.0000 mg | ORAL_TABLET | Freq: Once | ORAL | Status: AC
  Administered 2015-01-22: 1000 mg via ORAL
  Filled 2015-01-22: qty 2

## 2015-01-22 NOTE — Discharge Instructions (Signed)
Influenza, Adult °Influenza ("the flu") is a viral infection of the respiratory tract. It occurs more often in winter months because people spend more time in close contact with one another. Influenza can make you feel very sick. Influenza easily spreads from person to person (contagious). °CAUSES  °Influenza is caused by a virus that infects the respiratory tract. You can catch the virus by breathing in droplets from an infected person's cough or sneeze. You can also catch the virus by touching something that was recently contaminated with the virus and then touching your mouth, nose, or eyes. °RISKS AND COMPLICATIONS °You may be at risk for a more severe case of influenza if you smoke cigarettes, have diabetes, have chronic heart disease (such as heart failure) or lung disease (such as asthma), or if you have a weakened immune system. Elderly people and pregnant women are also at risk for more serious infections. The most common problem of influenza is a lung infection (pneumonia). Sometimes, this problem can require emergency medical care and may be life threatening. °SIGNS AND SYMPTOMS  °Symptoms typically last 4 to 10 days and may include: °· Fever. °· Chills. °· Headache, body aches, and muscle aches. °· Sore throat. °· Chest discomfort and cough. °· Poor appetite. °· Weakness or feeling tired. °· Dizziness. °· Nausea or vomiting. °DIAGNOSIS  °Diagnosis of influenza is often made based on your history and a physical exam. A nose or throat swab test can be done to confirm the diagnosis. °TREATMENT  °In mild cases, influenza goes away on its own. Treatment is directed at relieving symptoms. For more severe cases, your health care provider may prescribe antiviral medicines to shorten the sickness. Antibiotic medicines are not effective because the infection is caused by a virus, not by bacteria. °HOME CARE INSTRUCTIONS °· Take medicines only as directed by your health care provider. °· Use a cool mist humidifier  to make breathing easier. °· Get plenty of rest until your temperature returns to normal. This usually takes 3 to 4 days. °· Drink enough fluid to keep your urine clear or pale yellow. °· Cover your mouth and nose when coughing or sneezing, and wash your hands well to prevent the virus from spreading. °· Stay home from work or school until the fever is gone for at least 1 full day. °PREVENTION  °An annual influenza vaccination (flu shot) is the best way to avoid getting influenza. An annual flu shot is now routinely recommended for all adults in the [place] °SEEK MEDICAL CARE IF: °· You experience chest pain, your cough worsens, or you produce more mucus. °· You have nausea, vomiting, or diarrhea. °· Your fever returns or gets worse. °SEEK IMMEDIATE MEDICAL CARE IF: °· You have trouble breathing, you become short of breath, or your skin or nails become bluish. °· You have severe pain or stiffness in the neck. °· You develop a sudden headache, or pain in the face or ear. °· You have nausea or vomiting that you cannot control. °MAKE SURE YOU:  °· Understand these instructions. °· Will watch your condition. °· Will get help right away if you are not doing well or get worse. °  °This information is not intended to replace advice given to you by your health care provider. Make sure you discuss any questions you have with your health care provider. °  °Document Released: 01/01/2000 Document Revised: 01/24/2014 Document Reviewed: 04/04/2011 °Elsevier Interactive Patient Education ©2016 Else[employer] ° °Cough, Adult °Coughing is a reflex that clears your throat and your airways. Coughing helps to heal and protect your lungs. It is normal to cough occasionally, but a cough that happens with other   symptoms or lasts a long time may be a sign of a condition that needs treatment. A cough may last only 2-3 weeks (acute), or it may last longer than 8 weeks (chronic). °CAUSES °Coughing is commonly caused by: °· Breathing in substances  that irritate your lungs. °· A viral or bacterial respiratory infection. °· Allergies. °· Asthma. °· Postnasal drip. °· Smoking. °· Acid backing up from the stomach into the esophagus (gastroesophageal reflux). °· Certain medicines. °· Chronic lung problems, including COPD (or rarely, lung cancer). °· Other medical conditions such as heart failure. °HOME CARE INSTRUCTIONS  °Pay attention to any changes in your symptoms. Take these actions to help with your discomfort: °· Take medicines only as told by your health care provider. °¨ If you were prescribed an antibiotic medicine, take it as told by your health care provider. Do not stop taking the antibiotic even if you start to feel better. °¨ Talk with your health care provider before you take a cough suppressant medicine. °· Drink enough fluid to keep your urine clear or pale yellow. °· If the air is dry, use a cold steam vaporizer or humidifier in your bedroom or your home to help loosen secretions. °· Avoid anything that causes you to cough at work or at home. °· If your cough is worse at night, try sleeping in a semi-upright position. °· Avoid cigarette smoke. If you smoke, quit smoking. If you need help quitting, ask your health care provider. °· Avoid caffeine. °· Avoid alcohol. °· Rest as needed. °SEEK MEDICAL CARE IF:  °· You have new symptoms. °· You cough up pus. °· Your cough does not get better after 2-3 weeks, or your cough gets worse. °· You cannot control your cough with suppressant medicines and you are losing sleep. °· You develop pain that is getting worse or pain that is not controlled with pain medicines. °· You have a fever. °· You have unexplained weight loss. °· You have night sweats. °SEEK IMMEDIATE MEDICAL CARE IF: °· You cough up blood. °· You have difficulty breathing. °· Your heartbeat is very fast. °  °This information is not intended to replace advice given to you by your health care provider. Make sure you discuss any questions you have  with your health care provider. °  °Document Released: 07/02/2010 Document Revised: 09/24/2014 Document Reviewed: 03/12/2014 °Elsevier Interactive Patient Education ©2016 Elsevier Inc. ° °

## 2015-01-22 NOTE — ED Notes (Signed)
Per pt, states cold symptoms, cough, congestion, body aches for 2 weeks-symptoms worse on Monday

## 2015-01-22 NOTE — ED Provider Notes (Signed)
CSN: [id number]     Arrival date & time 01/22/15  0910 History   First MD Initiated Contact with Patient 01/22/15 1040     Chief Complaint  Patient presents with  . URI    HPI  Derrick Scott is a 40 y.o. male who presents to the emergency department complaining of sneezing and scratchy throat for a week and a half and then starting 4 days ago with fever, body aches, coughing, sneezing. He did not get his flu shot this year. He reports running low-grade temperature over the past 4 days. He reports taking TheraFlu with some relief last night but has had taken nothing for treatment today. The patient denies shortness of breath, abdominal pain, vomiting, diarrhea, syncope, dizziness, chest pain, palpitations or urinary symptoms.  Past Medical History  Diagnosis Date  . Medial meniscus tear 01/2012    right knee  . Pneumonia   . Vertigo, benign paroxysmal 2015  . Renal disorder    Past Surgical History  Procedure Laterality Date  . Corneal transplant  12/2009  . Knee arthroscopy  02/20/2012    Procedure: ARTHROSCOPY KNEE;  Surgeon: Alta Corning, MD;  Location: [place];  Service: Orthopedics;  Laterality: Right;  Chondroplasia patella femoral joint, medial meniscal debriedment   Family History  Problem Relation Age of Onset  . Asthma Mother   . Diabetes Father   . Cancer Father     prostate  . Osteoarthritis Sister   . Diabetes Sister    Social History  Substance Use Topics  . Smoking status: Never Smoker   . Smokeless tobacco: Never Used  . Alcohol Use: No    Review of Systems  Constitutional: Positive for fever and fatigue.  HENT: Positive for congestion, postnasal drip, rhinorrhea and sneezing. Negative for ear pain, mouth sores, nosebleeds, sore throat and trouble swallowing.   Eyes: Negative for visual disturbance.  Respiratory: Positive for cough. Negative for chest tightness, shortness of breath and wheezing.   Cardiovascular: Negative for chest  pain and palpitations.  Gastrointestinal: Negative for nausea, vomiting, abdominal pain and diarrhea.  Genitourinary: Negative for dysuria.  Musculoskeletal: Positive for myalgias. Negative for back pain and neck pain.  Skin: Negative for rash.  Neurological: Negative for dizziness and headaches.      Allergies  Review of patient's allergies indicates no known allergies.  Home Medications   Prior to Admission medications   Medication Sig Start Date End Date Taking? Authorizing Provider  acetaminophen (TYLENOL) 325 MG tablet Take 650 mg by mouth every 6 (six) hours as needed for moderate pain.    Historical Provider, MD  benzonatate (TESSALON) 100 MG capsule Take 1 capsule (100 mg total) by mouth 3 (three) times daily as needed for cough. 01/22/15   Waynetta Pean, PA-C  cetirizine (ZYRTEC ALLERGY) 10 MG tablet Take 1 tablet (10 mg total) by mouth daily. 01/22/15   Waynetta Pean, PA-C  HYDROcodone-acetaminophen (NORCO) 5-325 MG per tablet Take 2 tablets by mouth every 4 (four) hours as needed. Patient not taking: Reported on 08/18/2014 04/23/14   [name], PA-C  ibuprofen (ADVIL,MOTRIN) 600 MG tablet Take 1 tablet (600 mg total) by mouth every 8 (eight) hours as needed. Take with food. 10/10/14   Lajean Saver, MD  meclizine (ANTIVERT) 25 MG tablet Take 1 tablet (25 mg total) by mouth 3 (three) times daily as needed for dizziness. 08/15/14   Everlene Balls, MD  methocarbamol (ROBAXIN) 500 MG tablet Take 1 tablet (500 mg total)  by mouth 2 (two) times daily. 11/01/14   Nicole Pisciotta, PA-C  naproxen (NAPROSYN) 250 MG tablet Take 1 tablet (250 mg total) by mouth 2 (two) times daily with a meal. 01/22/15   Waynetta Pean, PA-C  prednisoLONE acetate (PRED FORTE) 1 % ophthalmic suspension Place 2 drops into the left eye 2 (two) times daily. 07/25/14   Patrecia Pour, MD  tamsulosin (FLOMAX) 0.4 MG CAPS capsule Take 1 capsule (0.4 mg total) by mouth 2 (two) times daily. Patient not taking: Reported on  08/18/2014 04/23/14   [name], PA-C   BP 124/89 mmHg  Pulse 106  Temp(Src) 100.4 F (38 C) (Oral)  Resp 20  SpO2 98% Physical Exam  Constitutional: He is oriented to person, place, and time. He appears well-developed and well-nourished. No distress.  Nontoxic appearing.  HENT:  Head: Normocephalic and atraumatic.  Right Ear: External ear normal.  Left Ear: External ear normal.  Mouth/Throat: No oropharyngeal exudate.  Boggy nasal turbinates. Mild middle ear effusion bilaterally. No TM erythema or loss of landmarks. No tonsillar hypertrophy or exudates.  Eyes: Conjunctivae are normal. Pupils are equal, round, and reactive to light. Right eye exhibits no discharge. Left eye exhibits no discharge.  Neck: Normal range of motion. Neck supple.  Cardiovascular: Normal rate, regular rhythm, normal heart sounds and intact distal pulses.   Heart rate is 88.  Pulmonary/Chest: Effort normal and breath sounds normal. No respiratory distress. He has no wheezes. He has no rales.  Lungs clear to auscultation bilaterally.  Abdominal: Soft. There is no tenderness. There is no guarding.  Lymphadenopathy:    He has no cervical adenopathy.  Neurological: He is alert and oriented to person, place, and time. Coordination normal.  Skin: Skin is warm and dry. No rash noted. He is not diaphoretic. No erythema. No pallor.  Psychiatric: He has a normal mood and affect. His behavior is normal.  Nursing note and vitals reviewed.   ED Course  Procedures (including critical care time) Labs Review Labs Reviewed - No data to display  Imaging Review Dg Chest 2 View  01/22/2015  CLINICAL DATA:  Cough and congestion for 3 days EXAM: CHEST  2 VIEW COMPARISON:  August 18, 2014 FINDINGS: There is no edema or consolidation. Heart is upper normal in size with pulmonary vascularity within normal limits. No adenopathy. No bone lesions. IMPRESSION: No edema or consolidation. Electronically Signed   By: Lowella Grip III M.D.   On: 01/22/2015 09:52   I have personally reviewed and evaluated these images and lab results as part of my medical decision-making.   EKG Interpretation None      Filed Vitals:   01/22/15 0926  BP: 124/89  Pulse: 106  Temp: 100.4 F (38 C)  TempSrc: Oral  Resp: 20  SpO2: 98%     MDM   Meds given in ED:  Medications  acetaminophen (TYLENOL) tablet 1,000 mg (1,000 mg Oral Given 01/22/15 1100)    New Prescriptions   BENZONATATE (TESSALON) 100 MG CAPSULE    Take 1 capsule (100 mg total) by mouth 3 (three) times daily as needed for cough.   CETIRIZINE (ZYRTEC ALLERGY) 10 MG TABLET    Take 1 tablet (10 mg total) by mouth daily.   NAPROXEN (NAPROSYN) 250 MG TABLET    Take 1 tablet (250 mg total) by mouth 2 (two) times daily with a meal.    Final diagnoses:  Influenza   Patient with symptoms consistent with influenza.  Vitals  are stable, low-grade fever.  No signs of dehydration, tolerating PO's.  Lungs are clear. Chest x-ray is unremarkable.  Discussed the cost versus benefit of Tamiflu treatment with the patient.  The patient understands that symptoms are greater than the recommended 24-48 hour window of treatment.  Patient will be discharged with instructions to orally hydrate, rest, and use over-the-counter medications.  Patient will also be given a cough suppressant.  I discussed strict return precautions.  I advised the patient to follow-up with their primary care provider this week. I advised the patient to return to the emergency department with new or worsening symptoms or new concerns. The patient verbalized understanding and agreement with plan.        Waynetta Pean, PA-C 01/22/15 [place], MD 01/22/15 [phone]

## 2015-01-27 ENCOUNTER — Emergency Department (HOSPITAL_BASED_OUTPATIENT_CLINIC_OR_DEPARTMENT_OTHER): Payer: Self-pay

## 2015-01-27 ENCOUNTER — Encounter (HOSPITAL_BASED_OUTPATIENT_CLINIC_OR_DEPARTMENT_OTHER): Payer: Self-pay

## 2015-01-27 ENCOUNTER — Emergency Department (HOSPITAL_BASED_OUTPATIENT_CLINIC_OR_DEPARTMENT_OTHER)
Admission: EM | Admit: 2015-01-27 | Discharge: 2015-01-27 | Disposition: A | Discharge status: Other Acute Inpatient Hospital | Payer: Self-pay | Attending: Emergency Medicine | Admitting: Emergency Medicine

## 2015-01-27 DIAGNOSIS — J159 Unspecified bacterial pneumonia: Secondary | ICD-10-CM | POA: Insufficient documentation

## 2015-01-27 DIAGNOSIS — Z87448 Personal history of other diseases of urinary system: Secondary | ICD-10-CM | POA: Insufficient documentation

## 2015-01-27 DIAGNOSIS — Z8669 Personal history of other diseases of the nervous system and sense organs: Secondary | ICD-10-CM | POA: Insufficient documentation

## 2015-01-27 DIAGNOSIS — J189 Pneumonia, unspecified organism: Secondary | ICD-10-CM

## 2015-01-27 DIAGNOSIS — E669 Obesity, unspecified: Secondary | ICD-10-CM | POA: Insufficient documentation

## 2015-01-27 DIAGNOSIS — Z791 Long term (current) use of non-steroidal anti-inflammatories (NSAID): Secondary | ICD-10-CM | POA: Insufficient documentation

## 2015-01-27 DIAGNOSIS — Z79899 Other long term (current) drug therapy: Secondary | ICD-10-CM | POA: Insufficient documentation

## 2015-01-27 MED ORDER — HYDROCOD POLST-CPM POLST ER 10-8 MG/5ML PO SUER: 5.0000 mL | Freq: Two times a day (BID) | ORAL | Status: DC | PRN

## 2015-01-27 MED ORDER — AZITHROMYCIN 250 MG PO TABS: 250.0000 mg | ORAL_TABLET | Freq: Every day | ORAL | Status: DC

## 2015-01-27 NOTE — Discharge Instructions (Signed)
You were evaluated in the ED today and diagnosed with pneumonia. Please take your antibiotics as prescribed. Take your cough medicine as we discussed then do not take this before driving is making very drowsy. Follow-up with your doctor in the next week for reevaluation and repeat chest x-ray to ensure your pneumonia is resolving. Return to ED for any new or worsening symptoms.  Community-Acquired Pneumonia, Adult Pneumonia is an infection of the lungs. One type of pneumonia can happen while a person is in a hospital. A different type can happen when a person is not in a hospital (community-acquired pneumonia). It is easy for this kind to spread from person to person. It can spread to you if you breathe near an infected person who coughs or sneezes. Some symptoms include:  A dry cough.  A wet (productive) cough.  Fever.  Sweating.  Chest pain. HOME CARE  Take over-the-counter and prescription medicines only as told by your doctor.  Only take cough medicine if you are losing sleep.  If you were prescribed an antibiotic medicine, take it as told by your doctor. Do not stop taking the antibiotic even if you start to feel better.  Sleep with your head and neck raised (elevated). You can do this by putting a few pillows under your head, or you can sleep in a recliner.  Do not use tobacco products. These include cigarettes, chewing tobacco, and e-cigarettes. If you need help quitting, ask your doctor.  Drink enough water to keep your pee (urine) clear or pale yellow. A shot (vaccine) can help prevent pneumonia. Shots are often suggested for:  People older than 41 years of age.  People older than 41 years of age:  Who are having cancer treatment.  Who have long-term (chronic) lung disease.  Who have problems with their body's defense system (immune system). You may also prevent pneumonia if you take these actions:  Get the flu (influenza) shot every year.  Go to the dentist as  often as told.  Wash your hands often. If soap and water are not available, use hand sanitizer. GET HELP IF:  You have a fever.  You lose sleep because your cough medicine does not help. GET HELP RIGHT AWAY IF:  You are short of breath and it gets worse.  You have more chest pain.  Your sickness gets worse. This is very serious if:  You are an older adult.  Your body's defense system is weak.  You cough up blood.   This information is not intended to replace advice given to you by your health care provider. Make sure you discuss any questions you have with your health care provider.   Document Released: 06/22/2007 Document Revised: 09/24/2014 Document Reviewed: 04/30/2014 Elsevier Interactive Patient Education [employer].

## 2015-01-27 NOTE — ED Provider Notes (Signed)
CSN: [id number]     Arrival date & time 01/27/15  1150 History   First MD Initiated Contact with Patient 01/27/15 1216     Chief Complaint  Patient presents with  . Influenza     (Consider location/radiation/quality/duration/timing/severity/associated sxs/prior Treatment) HPI Derrick Scott is a 40 y.o. male who comes in for evaluation of flulike symptoms. Patient reports he was seen 5 days ago in the emergency department [place] diagnosed with the flu. He reports his symptoms are overall improving, however, he still reports some neck discomfort and cough. Previously prescribed medications are not helping. He reports his neck discomfort as waxing and waning, worse at night after working. He reports his fevers are also improving. He denies any chest pain, shortness of breath, abdominal pain, nausea or vomiting, diarrhea or constipation, syncope. He does report drinking plenty of fluids including water and Gatorade. No other modifying factors.  Past Medical History  Diagnosis Date  . Medial meniscus tear 01/2012    right knee  . Pneumonia   . Vertigo, benign paroxysmal 2015  . Renal disorder    Past Surgical History  Procedure Laterality Date  . Corneal transplant  12/2009  . Knee arthroscopy  02/20/2012    Procedure: ARTHROSCOPY KNEE;  Surgeon: Alta Corning, MD;  Location: [place];  Service: Orthopedics;  Laterality: Right;  Chondroplasia patella femoral joint, medial meniscal debriedment   Family History  Problem Relation Age of Onset  . Asthma Mother   . Diabetes Father   . Cancer Father     prostate  . Osteoarthritis Sister   . Diabetes Sister    Social History  Substance Use Topics  . Smoking status: Never Smoker   . Smokeless tobacco: Never Used  . Alcohol Use: No    Review of Systems A 10 point review of systems was completed and was negative except for pertinent positives and negatives as mentioned in the history of present illness      Allergies  Review of patient's allergies indicates no known allergies.  Home Medications   Prior to Admission medications   Medication Sig Start Date End Date Taking? Authorizing Provider  acetaminophen (TYLENOL) 325 MG tablet Take 650 mg by mouth every 6 (six) hours as needed for moderate pain.    Historical Provider, MD  azithromycin (ZITHROMAX) 250 MG tablet Take 1 tablet (250 mg total) by mouth daily. Take first 2 tablets together, then 1 every day until finished. 01/27/15   [name], PA-C  benzonatate (TESSALON) 100 MG capsule Take 1 capsule (100 mg total) by mouth 3 (three) times daily as needed for cough. 01/22/15   Waynetta Pean, PA-C  cetirizine (ZYRTEC ALLERGY) 10 MG tablet Take 1 tablet (10 mg total) by mouth daily. 01/22/15   Waynetta Pean, PA-C  chlorpheniramine-HYDROcodone (TUSSIONEX PENNKINETIC ER) 10-8 MG/5ML SUER Take 5 mLs by mouth every 12 (twelve) hours as needed for cough. 01/27/15   [name], PA-C  ibuprofen (ADVIL,MOTRIN) 600 MG tablet Take 1 tablet (600 mg total) by mouth every 8 (eight) hours as needed. Take with food. 10/10/14   Lajean Saver, MD  naproxen (NAPROSYN) 250 MG tablet Take 1 tablet (250 mg total) by mouth 2 (two) times daily with a meal. 01/22/15   Waynetta Pean, PA-C  tamsulosin (FLOMAX) 0.4 MG CAPS capsule Take 1 capsule (0.4 mg total) by mouth 2 (two) times daily. Patient not taking: Reported on 08/18/2014 04/23/14   [name], PA-C   BP 132/81 mmHg  Pulse 102  Temp(Src) 98.6 F (37 C) (Oral)  Resp 18  Ht 5\' 8"  (1.727 m)  Wt 118.842 kg  BMI 39.85 kg/m2  SpO2 97% Physical Exam  Constitutional: He is oriented to person, place, and time. He appears well-developed and well-nourished.  Obese African-American male  HENT:  Head: Normocephalic and atraumatic.  Mouth/Throat: Oropharynx is clear and moist.  Eyes: Conjunctivae are normal. Pupils are equal, round, and reactive to light. Right eye exhibits no discharge. Left eye exhibits  no discharge. No scleral icterus.  Neck: Neck supple.  Patient with normal range of motion of neck with no meningismus or nuchal rigidity.  Cardiovascular: Normal rate, regular rhythm and normal heart sounds.   Pulmonary/Chest: Effort normal and breath sounds normal. No respiratory distress. He has no wheezes.  Mild rales in right lower and middle lobes. Difficult to appreciate other lung sounds as patient is actively coughing.  Abdominal: Soft. There is no tenderness.  Musculoskeletal: He exhibits no tenderness.  Neurological: He is alert and oriented to person, place, and time.  Cranial Nerves Scott-XII grossly intact. Motor strength and sensation are intact and baseline for patient. Completes fine motor coordination movements without difficulty.  Skin: Skin is warm and dry. No rash noted.  Psychiatric: He has a normal mood and affect.  Nursing note and vitals reviewed.   ED Course  Procedures (including critical care time) Labs Review Labs Reviewed - No data to display  Imaging Review Dg Chest 2 View  01/27/2015  CLINICAL DATA:  Cough and fever EXAM: CHEST  2 VIEW COMPARISON:  01/22/2015 FINDINGS: New reticular nodular opacities at the right base. No effusion or cavitation. Borderline cardiomegaly is stable, accentuated by apical fat pad. Negative aortic and hilar contours. IMPRESSION: Right basilar pneumonia. Followup PA and lateral chest X-ray is recommended in 3-4 weeks following trial of antibiotic therapy to ensure resolution. Electronically Signed   By: Monte Fantasia M.D.   On: 01/27/2015 13:01   I have personally reviewed and evaluated these images and lab results as part of my medical decision-making.   EKG Interpretation None     Meds given in ED:  Medications - No data to display  Discharge Medication List as of 01/27/2015  1:51 PM     Filed Vitals:   01/27/15 1155  BP: 132/81  Pulse: 102  Temp: 98.6 F (37 C)  TempSrc: Oral  Resp: 18  Height: 5\' 8"  (1.727 m)   Weight: 118.842 kg  SpO2: 97%     MDM  Derrick Scott is a 40 y.o. male who presents for evaluation of ongoing URI symptoms. Last seen at [facility] ED on 1/5 and diagnosed with URI/flulike symptoms. On arrival, patient is hemodynamically stable with very mild tachycardia and remains afebrile. On my exam, patient has no tachycardia and heart rate is mid 80s. Difficult to complete pulmonary exam as patient is actively coughing, suspect some rales in right lower lobe. We'll repeat chest x-ray to ensure no pneumonia. Otherwise, patient will be discharged with symptomatic care and follow-up with PCP. Chest x-ray shows right basilar pneumonia. Patient discharged with azithromycin. Encourage follow-up with PCP for repeat chest x-ray to ensure pneumonia resolution. Patient verbalizes understanding and agrees with this plan as well as subsequent discharge. DC with prescription for antitussive as well. The patient appears reasonably screened and/or stabilized for discharge and I doubt any other medical condition or other [facility] requiring further screening, evaluation, or treatment in the ED at this time prior to  discharge.   Final diagnoses:  Community acquired pneumonia       [name], PA-C 01/27/15 1809  Charlesetta Shanks, MD 01/29/15 1330

## 2015-01-27 NOTE — ED Notes (Signed)
Dx with "the flu" at [facility] ED last Thursday-states he is not feeling well and was to return to work yesterday but was unable due to cont' HA and pain to posterior neck-NAD

## 2015-01-29 ENCOUNTER — Encounter: Payer: Self-pay | Admitting: Family Medicine

## 2015-01-29 ENCOUNTER — Ambulatory Visit (INDEPENDENT_AMBULATORY_CARE_PROVIDER_SITE_OTHER): Payer: Self-pay | Admitting: Family Medicine

## 2015-01-29 VITALS — BP 105/68 | HR 95 | Temp 98.4°F | Wt 267.3 lb

## 2015-01-29 DIAGNOSIS — R519 Headache, unspecified: Secondary | ICD-10-CM

## 2015-01-29 DIAGNOSIS — R51 Headache: Secondary | ICD-10-CM

## 2015-01-29 MED ORDER — KETOROLAC TROMETHAMINE 30 MG/ML IJ SOLN
30.0000 mg | Freq: Once | INTRAMUSCULAR | Status: AC
  Administered 2015-01-29: 30 mg via INTRAMUSCULAR

## 2015-01-29 MED ORDER — DEXAMETHASONE SODIUM PHOSPHATE 10 MG/ML IJ SOLN
10.0000 mg | Freq: Once | INTRAMUSCULAR | Status: AC
  Administered 2015-01-29: 10 mg via INTRAMUSCULAR

## 2015-01-29 MED ORDER — DEXAMETHASONE SODIUM PHOSPHATE 10 MG/ML IJ SOLN: 10.0000 mg | Freq: Once | INTRAMUSCULAR | Status: DC

## 2015-01-29 NOTE — Progress Notes (Signed)
   Subjective:    Patient ID: Derrick Scott, male    DOB: [birth year], 40 y.o.   MRN: [id number]  Seen for Same day visit for   CC: Headache  HEADACHE  He reports flu like symptoms on 12/3 was seen in ED and treated symptomatically. He developed worsening cough and was diagnosed with PNA 12/10 and given Zpack. Has been feeling better since starting Zpack but continues to have HA that is worse with cough. Not associated with fevers (resolved 3 days ago), Not similar to previous migraines but similar to previous HAs associated with URIs.   Headache started 7 days ago Pain is trobbing Severity:  Mild - mod Location: posterior neck and head Medications tried: tylenol and ibuprofen Head trauma: no Sudden onset: no Previous similar headaches: yes Taking blood thinners: no History of cancer: no  Symptoms Nose congestion stuffiness:  yes Nausea vomiting: no Photophobia: no Noise sensitivity: no Double vision or loss of vision: no Fever: yes but resolved 3 days ago Neck Stiffness: no Trouble walking or speaking: no  Patient thinks cause of headache might be: current PNA / Flu  Objective:  BP 105/68 mmHg  Pulse 95  Temp(Src) 98.4 F (36.9 C) (Oral)  Wt 267 lb 4.8 oz (121.246 kg)  SpO2 95%  General: NAD Cardiac: RRR, normal heart sounds, no murmurs. 2+ radial and PT pulses bilaterally Respiratory: Diffuse rhonchi; no wheezes, normal effort Skin: warm and dry, no rashes noted Neuro: PERRLA, EOMI; alert and oriented, no focal deficits    Assessment & Plan:   Headache Suspect HA related to current PNA and cough. No current red flags. Afebrile.  - Toradol and Decadron given in clinic - Work note provided until Monday - Advised f/u Monday if HA persist or develops fevers / new symptoms

## 2015-01-29 NOTE — Patient Instructions (Signed)
It was great seeing you today.   1. The shots today should help with your headache. But you should drink plenty of water and get some rest.  2. Call the clinic if you develop fevers again, and your headache persist or gets worse.  3. Return to clinic on Monday if you are not starting to feel better 4. Continue to use your tylenol and ibuprofen as prescribed. But don't use ibuprofen for the next 24 hours.   If you have any questions or concerns before then, please call the clinic at [phone].  Take Care,   Dr Phill Myron

## 2015-01-29 NOTE — Assessment & Plan Note (Addendum)
Suspect HA related to current PNA and cough. No current red flags. Afebrile.  - Toradol and Decadron given in clinic - Work note provided until Monday - Advised f/u Monday if HA persist or develops fevers / new symptoms

## 2015-06-27 ENCOUNTER — Emergency Department (HOSPITAL_COMMUNITY)
Admission: EM | Admit: 2015-06-27 | Discharge: 2015-06-27 | Disposition: A | Discharge status: Home / Self Care | Payer: BC Managed Care – PPO | Attending: Emergency Medicine | Admitting: Emergency Medicine

## 2015-06-27 ENCOUNTER — Encounter (HOSPITAL_COMMUNITY): Payer: Self-pay | Admitting: Emergency Medicine

## 2015-06-27 DIAGNOSIS — J029 Acute pharyngitis, unspecified: Secondary | ICD-10-CM

## 2015-06-27 DIAGNOSIS — J351 Hypertrophy of tonsils: Secondary | ICD-10-CM | POA: Insufficient documentation

## 2015-06-27 LAB — MONONUCLEOSIS SCREEN: Mono Screen: NEGATIVE

## 2015-06-27 LAB — RAPID STREP SCREEN (MED CTR MEBANE ONLY): Streptococcus, Group A Screen (Direct): NEGATIVE

## 2015-06-27 MED ORDER — LORATADINE 10 MG PO TABS: 10.0000 mg | ORAL_TABLET | Freq: Every day | ORAL | Status: DC

## 2015-06-27 MED ORDER — LIDOCAINE VISCOUS 2 % MT SOLN
15.0000 mL | Freq: Once | OROMUCOSAL | Status: AC
  Administered 2015-06-27: 15 mL via OROMUCOSAL
  Filled 2015-06-27: qty 15

## 2015-06-27 MED ORDER — RANITIDINE HCL 150 MG PO TABS: 150.0000 mg | ORAL_TABLET | Freq: Two times a day (BID) | ORAL | Status: DC

## 2015-06-27 NOTE — Discharge Instructions (Signed)
Your sore throat could be related to silent reflux, start taking zantac as directed to see if this helps. There are other causes of throat pain as well, such as postnasal drip from allergies, viral infections, in addition to many other causes. Gargle warm salt water and spit it out. May consider using chloraseptic spray as needed for pain. May consider using tylenol for additional relief, but avoid NSAIDs like ibuprofen/motrin/aleve/naprosyn/etc. May consider over-the-counter antihistamine such as zyrtec or claritin to help with symptoms. Followup with the Ear Nose and Throat doctor in 1 week for ongoing evaluation of your throat pain, and with your primary care doctor in 1-2 weeks for recheck of ongoing symptoms. Return to emergency department for emergent changing or worsening of symptoms.   Sore Throat A sore throat is a painful, burning, sore, or scratchy feeling of the throat. There may be pain or tenderness when swallowing or talking. You may have other symptoms with a sore throat. These include coughing, sneezing, fever, or a swollen neck. A sore throat is often the first sign of another sickness. These sicknesses may include a cold, flu, strep throat, or an infection called mono. Most sore throats go away without medical treatment.  HOME CARE   Only take medicine as told by your doctor.  Drink enough fluids to keep your pee (urine) clear or pale yellow.  Rest as needed.  Try using throat sprays, lozenges, or suck on hard candy (if older than 4 years or as told).  Sip warm liquids, such as broth, herbal tea, or warm water with honey. Try sucking on frozen ice pops or drinking cold liquids.  Rinse the mouth (gargle) with salt water. Mix 1 teaspoon salt with 8 ounces of water.  Do not smoke. Avoid being around others when they are smoking.  Put a humidifier in your bedroom at night to moisten the air. You can also turn on a hot shower and sit in the bathroom for 5-10 minutes. Be sure the  bathroom door is closed. GET HELP RIGHT AWAY IF:   You have trouble breathing.  You cannot swallow fluids, soft foods, or your spit (saliva).  You have more puffiness (swelling) in the throat.  Your sore throat does not get better in 7 days.  You feel sick to your stomach (nauseous) and throw up (vomit).  You have a fever or lasting symptoms for more than 2-3 days.  You have a fever and your symptoms suddenly get worse. MAKE SURE YOU:   Understand these instructions.  Will watch your condition.  Will get help right away if you are not doing well or get worse.   This information is not intended to replace advice given to you by your health care provider. Make sure you discuss any questions you have with your health care provider.   Document Released: 10/13/2007 Document Revised: 09/28/2011 Document Reviewed: 09/11/2011 Elsevier Interactive Patient Education [employer].

## 2015-06-27 NOTE — ED Provider Notes (Signed)
CSN: [id number]     Arrival date & time 06/27/15  1716 History   First MD Initiated Contact with Patient 06/27/15 1738     Chief Complaint  Patient presents with  . Sore Throat     (Consider location/radiation/quality/duration/timing/severity/associated sxs/prior Treatment) HPI Comments: Derrick Scott is a 40 y.o. male with a PMHx of BPPV, adrenal adenoma, and nephrolithiasis, who presents to the ED with complaints of 3 months of gradually worsening throat pain. Patient states that he was previously using apple cider vinegar mixed with water and started noticing some throat pain, felt like he had a cut on the left side of his posterior throat, so he stopped using apple cider vinegar but the pain continued. He describes the pain is 8/10 sharp and sore posterior throat pain and anterior neck pain, constant and nonradiating, worse with talking, drinking, and swallowing, and unrelieved with ginger and honey Tea and saltwater gargles. He has a remote history of acid reflux but has not had any recent issues with his reflux. Does not believe that he has ever had anybody told that his tonsils were enlarged at baseline. Denies any recent travel or sick contacts.  Denies any fevers, chills, drooling, trismus, inability to swallow, ear pain or drainage, rhinorrhea, dental pain, chest pain, shortness breath, abdominal pain, nausea, vomiting, diarrhea, constipation, dysuria, hematuria, numbness, tingling, or focal weakness.  Patient is a 40 y.o. male presenting with pharyngitis. The history is provided by the patient. No language interpreter was used.  Sore Throat This is a new problem. The current episode started more than 1 month ago. The problem occurs constantly. The problem has been gradually worsening. Associated symptoms include a sore throat. Pertinent negatives include no abdominal pain, arthralgias, chest pain, chills, fever, myalgias, nausea, numbness, urinary symptoms, vomiting or weakness.  The symptoms are aggravated by swallowing. Treatments tried: salt water gargle, hot tea with ginger/honey. The treatment provided no relief.    Past Medical History  Diagnosis Date  . Medial meniscus tear 01/2012    right knee  . Pneumonia   . Vertigo, benign paroxysmal 2015  . Renal disorder    Past Surgical History  Procedure Laterality Date  . Corneal transplant  12/2009  . Knee arthroscopy  02/20/2012    Procedure: ARTHROSCOPY KNEE;  Surgeon: Alta Corning, MD;  Location: [facility];  Service: Orthopedics;  Laterality: Right;  Chondroplasia patella femoral joint, medial meniscal debriedment   Family History  Problem Relation Age of Onset  . Asthma Mother   . Diabetes Father   . Cancer Father     prostate  . Osteoarthritis Sister   . Diabetes Sister    Social History  Substance Use Topics  . Smoking status: Never Smoker   . Smokeless tobacco: Never Used  . Alcohol Use: No    Review of Systems  Constitutional: Negative for fever and chills.  HENT: Positive for sore throat. Negative for dental problem, drooling, ear discharge, ear pain, rhinorrhea and trouble swallowing.        No trismus  Respiratory: Negative for shortness of breath.   Cardiovascular: Negative for chest pain.  Gastrointestinal: Negative for nausea, vomiting, abdominal pain, diarrhea and constipation.       No GERD/reflux recently  Genitourinary: Negative for dysuria and hematuria.  Musculoskeletal: Negative for myalgias and arthralgias.  Skin: Negative for color change.  Allergic/Immunologic: Negative for immunocompromised state.  Neurological: Negative for weakness and numbness.  Psychiatric/Behavioral: Negative for confusion.   10 Systems  reviewed and are negative for acute change except as noted in the HPI.    Allergies  Review of patient's allergies indicates no known allergies.  Home Medications   Prior to Admission medications   Medication Sig Start Date End Date Taking?  Authorizing Provider  acetaminophen (TYLENOL) 325 MG tablet Take 650 mg by mouth every 6 (six) hours as needed for moderate pain.    Historical Provider, MD  azithromycin (ZITHROMAX) 250 MG tablet Take 1 tablet (250 mg total) by mouth daily. Take first 2 tablets together, then 1 every day until finished. 01/27/15   [name], PA-C  benzonatate (TESSALON) 100 MG capsule Take 1 capsule (100 mg total) by mouth 3 (three) times daily as needed for cough. 01/22/15   Waynetta Pean, PA-C  cetirizine (ZYRTEC ALLERGY) 10 MG tablet Take 1 tablet (10 mg total) by mouth daily. 01/22/15   Waynetta Pean, PA-C  chlorpheniramine-HYDROcodone (TUSSIONEX PENNKINETIC ER) 10-8 MG/5ML SUER Take 5 mLs by mouth every 12 (twelve) hours as needed for cough. 01/27/15   [name], PA-C  ibuprofen (ADVIL,MOTRIN) 600 MG tablet Take 1 tablet (600 mg total) by mouth every 8 (eight) hours as needed. Take with food. 10/10/14   Lajean Saver, MD  naproxen (NAPROSYN) 250 MG tablet Take 1 tablet (250 mg total) by mouth 2 (two) times daily with a meal. 01/22/15   Waynetta Pean, PA-C  tamsulosin (FLOMAX) 0.4 MG CAPS capsule Take 1 capsule (0.4 mg total) by mouth 2 (two) times daily. Patient not taking: Reported on 08/18/2014 04/23/14   [name], PA-C   BP 129/74 mmHg  Pulse 93  Temp(Src) 98.5 F (36.9 C)  Resp 18  Ht 5\' 8"  (1.727 m)  Wt 113.399 kg  BMI 38.02 kg/m2  SpO2 99% Physical Exam  Constitutional: He is oriented to person, place, and time. Vital signs are normal. He appears well-developed and well-nourished.  Non-toxic appearance. No distress.  Afebrile, nontoxic, NAD  HENT:  Head: Normocephalic and atraumatic.  Nose: Nose normal.  Mouth/Throat: Uvula is midline and mucous membranes are normal. No trismus in the jaw. No uvula swelling. Oropharyngeal exudate (exudate vs tonsillar stones), posterior oropharyngeal edema (3+ b/l tonsillar enlargement/hypertrophy) and posterior oropharyngeal erythema present. No  tonsillar abscesses.  Nose clear. Oropharynx moist, without uvular swelling or deviation, no trismus or drooling, with b/l 3+ tonsillar enlargement/hypertrophy with mild erythema, +exudates vs tonsillar stones noted to both tonsils, no evidence of ludwig's or PTA. Some mild tenderness to palpation of anterior neck/submandibular regions, no swelling of neck or jaw  Eyes: Conjunctivae and EOM are normal. Right eye exhibits no discharge. Left eye exhibits no discharge.  Neck: Normal range of motion and phonation normal. Neck supple. Tracheal tenderness present. No tracheal deviation present.  Phonation normal, no stridor or tracheal deviation, but some mild tenderness to anterior neck/trachea region as well as submandibular regions, no swelling noted  Cardiovascular: Normal rate, regular rhythm, normal heart sounds and intact distal pulses.  Exam reveals no gallop and no friction rub.   No murmur heard. Pulmonary/Chest: Effort normal and breath sounds normal. No stridor. No respiratory distress. He has no decreased breath sounds. He has no wheezes. He has no rhonchi. He has no rales.  Abdominal: Soft. Normal appearance and bowel sounds are normal. He exhibits no distension. There is no tenderness. There is no rigidity, no rebound and no guarding.  Musculoskeletal: Normal range of motion.  Lymphadenopathy:       Head (right side): No submandibular and no tonsillar  adenopathy present.       Head (left side): No submandibular and no tonsillar adenopathy present.    He has no cervical adenopathy.  No head or neck LAD noted  Neurological: He is alert and oriented to person, place, and time. He has normal strength. No sensory deficit.  Skin: Skin is warm, dry and intact. No rash noted.  Psychiatric: He has a normal mood and affect.  Nursing note and vitals reviewed.   ED Course  Procedures (including critical care time) Labs Review Labs Reviewed  RAPID STREP SCREEN (NOT AT [facility])  CULTURE, GROUP A  STREP [facility])  MONONUCLEOSIS SCREEN    Imaging Review No results found. I have personally reviewed and evaluated these images and lab results as part of my medical decision-making.   EKG Interpretation None      MDM   Final diagnoses:  Sore throat  Tonsillar enlargement    40 y.o. male here with 3 months of gradually worsening sharp/sore pain in his throat. Previously used apple cider vinegar and thought maybe he cut his throat, but pain persisted even after stopping vinegar use. On exam, b/l 3+ tonsillar hypertrophy/enlargement, mildly erythematous with some exudates vs tonsillar stones, no trismus/drooling, no evidence of PTA or ludwig's. Afebrile and nontoxic, handling secretions well. No LAD noted on exam, but tenderness to palpation of anterior neck. RST was obtained in triage, initially I cancelled the order but after exam I want to proceed with this testing, as well as monospot. Will give viscous lidocaine for symptoms. DDx includes silent reflux vs infectious etiology vs postnasal drip/allergic etiology, etc. Will reassess shortly  8:04 PM  RST neg, culture pending but low CENTOR score therefore doubt need for empiric tx for bacterial etiologies of sore throat. Monospot negative. Pt with significant relief after viscous lidocaine, although he didn't like the taste and felt it numbed it too much and he felt like he couldn't swallow because it was so numb-- suggested he try chloraseptic spray at home instead of sending home with viscous lidocaine rx. Discussed that this could be from silent reflux therefore want to start zantac to see if this helps, but could also have a small ulceration/abrasion to the pharynx that is not visualized on direct pharyngeal exam but perhaps ENT f/up for further eval (maybe nasopharyngeal endoscopy or some other form of improved visualization of the area may help determine other causes). ENT can also help with further eval/management of his tonsillar  enlargement which could be hypertrophy vs inflammatory in nature. Discussed salt water gargles and chloraseptic spray for symptom control. Also suggested he start antihistamines since it could be from a postnasal drip/allergic etiology. F/up with PCP and ENT in 1 week for ongoing eval of symptoms and recheck after today's visit. I explained the diagnosis and have given explicit precautions to return to the ER including for any other new or worsening symptoms. The patient understands and accepts the medical plan as it's been dictated and I have answered their questions. Discharge instructions concerning home care and prescriptions have been given. The patient is STABLE and is discharged to home in good condition.  BP 129/74 mmHg  Pulse 93  Temp(Src) 98.5 F (36.9 C)  Resp 18  Ht 5\' 8"  (1.727 m)  Wt 113.399 kg  BMI 38.02 kg/m2  SpO2 99%  Meds ordered this encounter  Medications  . lidocaine (XYLOCAINE) 2 % viscous mouth solution 15 mL    Sig:   . ranitidine (ZANTAC) 150 MG tablet  Sig: Take 1 tablet (150 mg total) by mouth 2 (two) times daily.    Dispense:  30 tablet    Refill:  0    Order Specific Question:  Supervising Provider    Answer:  MILLER, BRIAN [[id number]]  . loratadine (CLARITIN) 10 MG tablet    Sig: Take 1 tablet (10 mg total) by mouth daily.    Dispense:  30 tablet    Refill:  0    Order Specific Question:  Supervising Provider    Answer:  Noemi Chapel [[id number]]      Katrese Shell Camprubi-Soms, PA-C 06/27/15 2010  Milton Ferguson, MD 06/27/15 2149

## 2015-06-27 NOTE — ED Notes (Signed)
Pt from home with complaints of a sore throat x 3 months. Pt states he used to drink apple cider vinegar every day, until he felt the pain in his throat. He states it feel like a cut. Pt states he no longer drinks vinegar, but the pain is still present.

## 2015-06-29 LAB — CULTURE, GROUP A STREP (THRC)

## 2015-07-07 ENCOUNTER — Other Ambulatory Visit: Payer: Self-pay

## 2015-07-07 ENCOUNTER — Encounter (HOSPITAL_COMMUNITY): Payer: Self-pay

## 2015-07-07 ENCOUNTER — Emergency Department (HOSPITAL_COMMUNITY): Payer: BC Managed Care – PPO

## 2015-07-07 ENCOUNTER — Emergency Department (HOSPITAL_COMMUNITY)
Admission: EM | Admit: 2015-07-07 | Discharge: 2015-07-07 | Disposition: A | Discharge status: Home / Self Care | Payer: BC Managed Care – PPO | Attending: Emergency Medicine | Admitting: Emergency Medicine

## 2015-07-07 DIAGNOSIS — R072 Precordial pain: Secondary | ICD-10-CM | POA: Diagnosis not present

## 2015-07-07 DIAGNOSIS — Z79899 Other long term (current) drug therapy: Secondary | ICD-10-CM | POA: Diagnosis not present

## 2015-07-07 DIAGNOSIS — R079 Chest pain, unspecified: Secondary | ICD-10-CM

## 2015-07-07 LAB — CBC WITH DIFFERENTIAL/PLATELET
Basophils Absolute: 0.1 K/uL (ref 0.0–0.1)
Basophils Relative: 1 %
Eosinophils Absolute: 0.1 K/uL (ref 0.0–0.7)
Eosinophils Relative: 2 %
HCT: 42.8 % (ref 39.0–52.0)
Hemoglobin: 14.4 g/dL (ref 13.0–17.0)
Lymphocytes Relative: 41 %
Lymphs Abs: 3.3 K/uL (ref 0.7–4.0)
MCH: 29 pg (ref 26.0–34.0)
MCHC: 33.6 g/dL (ref 30.0–36.0)
MCV: 86.3 fL (ref 78.0–100.0)
Monocytes Absolute: 0.5 K/uL (ref 0.1–1.0)
Monocytes Relative: 6 %
Neutro Abs: 4 K/uL (ref 1.7–7.7)
Neutrophils Relative %: 50 %
Platelets: 243 K/uL (ref 150–400)
RBC: 4.96 MIL/uL (ref 4.22–5.81)
RDW: 13.6 % (ref 11.5–15.5)
WBC: 8 K/uL (ref 4.0–10.5)

## 2015-07-07 LAB — I-STAT TROPONIN, ED
Troponin i, poc: 0 ng/mL (ref 0.00–0.08)
Troponin i, poc: 0 ng/mL (ref 0.00–0.08)

## 2015-07-07 LAB — BASIC METABOLIC PANEL
Anion gap: 6 (ref 5–15)
BUN: 19 mg/dL (ref 6–20)
CHLORIDE: 106 mmol/L (ref 101–111)
CO2: 23 mmol/L (ref 22–32)
Calcium: 8.8 mg/dL — ABNORMAL LOW (ref 8.9–10.3)
Creatinine, Ser: 1.16 mg/dL (ref 0.61–1.24)
GFR calc Af Amer: >60 mL/min (ref >60)
GFR calc non Af Amer: >60 mL/min (ref >60)
GLUCOSE: 105 mg/dL — AB (ref 65–99)
POTASSIUM: 3.5 mmol/L (ref 3.5–5.1)
Sodium: 135 mmol/L (ref 135–145)

## 2015-07-07 LAB — D-DIMER, QUANTITATIVE (NOT AT ARMC)

## 2015-07-07 MED ORDER — ASPIRIN 81 MG PO CHEW
324.0000 mg | CHEWABLE_TABLET | Freq: Once | ORAL | Status: AC
  Administered 2015-07-07: 324 mg via ORAL
  Filled 2015-07-07: qty 4

## 2015-07-07 NOTE — ED Notes (Addendum)
Pt arrives From work after episodes of chest pain and short of breath x2.Pt denies chest painor shortness of breath but c/o "feeling jittery"

## 2015-07-07 NOTE — ED Provider Notes (Signed)
CSN: [id number]     Arrival date & time 07/07/15  1007 History   First MD Initiated Contact with Patient 07/07/15 1023     Chief Complaint  Patient presents with  . Chest Pain    Nebiyu Gerhart II is a 40 y.o. male who presents to the ED complaining of chest pain starting today. 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 He reports he felt like he could not catch his breath. He reports feeling jittery and anxious. EMS was called to the scene and patient symptoms resolved. He was not transported to the ED. He reports going back to work and he felt his chest pain returned. He reports his chest pain is now resolved and she is only feeling jittery and anxious. He is unsure of his chest pain worsened with exertion. He thinks it was worse with movement of his body.  He has taken nothing for treatment of his symptoms today. He denies personal or close family history of MI, PE or DVT. He denies history of hyperlipidemia, HTN or smoking. He denies fevers, leg pain, leg swelling, recent long travel, hemoptysis, cough, wheezing, abdominal pain, nausea, vomiting, diarrhea, rashes, numbness, tingling or weakness. He was recently seen for sore throat, but reports his sore throat has resolved.   The history is provided by the patient. No language interpreter was used.    Past Medical History  Diagnosis Date  . Medial meniscus tear 01/2012    right knee  . Pneumonia   . Vertigo, benign paroxysmal 2015  . Renal disorder    Past Surgical History  Procedure Laterality Date  . Corneal transplant  12/2009  . Knee arthroscopy  02/20/2012    Procedure: ARTHROSCOPY KNEE;  Surgeon: Alta Corning, MD;  Location: [facility];  Service: Orthopedics;  Laterality: Right;  Chondroplasia patella femoral joint, medial meniscal debriedment   Family History  Problem Relation Age of Onset  . Asthma  Mother   . Diabetes Father   . Cancer Father     prostate  . Osteoarthritis Sister   . Diabetes Sister    Social History  Substance Use Topics  . Smoking status: Never Smoker   . Smokeless tobacco: Never Used  . Alcohol Use: No    Review of Systems  Constitutional: Negative for fever and chills.  HENT: Negative for congestion and sore throat.   Eyes: Negative for visual disturbance.  Respiratory: Positive for shortness of breath. Negative for cough and wheezing.   Cardiovascular: Positive for chest pain. Negative for palpitations and leg swelling.  Gastrointestinal: Negative for nausea, vomiting, abdominal pain and diarrhea.  Genitourinary: Negative for dysuria and difficulty urinating.  Musculoskeletal: Negative for back pain, neck pain and neck stiffness.  Skin: Negative for rash.  Neurological: Negative for weakness, numbness and headaches.  Psychiatric/Behavioral: The patient is nervous/anxious.       Allergies  Review of patient's allergies indicates no known allergies.  Home Medications   Prior to Admission medications   Medication Sig Start Date End Date Taking? Authorizing Provider  cetirizine (ZYRTEC ALLERGY) 10 MG tablet Take 1 tablet (10 mg total) by mouth daily. Patient not taking: Reported on 06/27/2015 01/22/15   Waynetta Pean, PA-C  loratadine (CLARITIN) 10 MG tablet Take 1 tablet (10 mg total) by mouth daily. Patient not taking: Reported on 07/07/2015 06/27/15   Mercedes Camprubi-Soms, PA-C  ranitidine (ZANTAC) 150 MG tablet Take 1 tablet (150 mg total) by mouth 2 (two) times daily. Patient not taking: Reported on 07/07/2015 06/27/15   Mercedes Camprubi-Soms, PA-C   BP 115/69 mmHg  Pulse 84  Temp(Src) 98.5 F (36.9 C) (Oral)  Resp 21  Ht 5\' 8"  (1.727 m)  Wt 112.038 kg  BMI 37.56 kg/m2  SpO2 98% Physical Exam  Constitutional: He appears well-developed and well-nourished. No distress.  Nontoxic appearing.  HENT:  Head: Normocephalic and atraumatic.   Mouth/Throat: Oropharynx is clear and moist.  Mild-moderate bilateral tonsillar hypertrophy without exudates. Uvula is midline without edema. No trismus. No drooling.  No PTA.  Eyes: Conjunctivae are normal. Pupils are equal, round, and reactive to light. Right eye exhibits no discharge. Left eye exhibits no discharge.  Neck: Normal range of motion. Neck supple. No JVD present. No tracheal deviation present.  Cardiovascular: Normal rate, regular rhythm, normal heart sounds and intact distal pulses.  Exam reveals no gallop and no friction rub.   No murmur heard. Bilateral radial, posterior tibialis and dorsalis pedis pulses are intact.    Pulmonary/Chest: Effort normal and breath sounds normal. No stridor. No respiratory distress. He has no wheezes. He has no rales. He exhibits tenderness.  Lungs are clear to auscultation bilaterally. Some substernal chest wall tenderness to palpation which reproduces his chest pain  Abdominal: Soft. Bowel sounds are normal. There is no tenderness. There is no guarding.  Musculoskeletal: He exhibits no edema or tenderness.  No lower extremity edema or tenderness.  Lymphadenopathy:    He has no cervical adenopathy.  Neurological: He is alert. Coordination normal.  Skin: Skin is warm and dry. No rash noted. He is not diaphoretic. No erythema. No pallor.  Psychiatric: He has a normal mood and affect. His behavior is normal.  Nursing note and vitals reviewed.   ED Course  Procedures (including critical care time) Labs Review Labs Reviewed  BASIC METABOLIC PANEL - Abnormal; Notable for the following:    Glucose, Bld 105 (*)    Calcium 8.8 (*)    All other components within normal limits  CBC WITH DIFFERENTIAL/PLATELET  D-DIMER, QUANTITATIVE (NOT AT [facility])  I-STAT TROPOININ, ED  Randolm Idol, ED    Imaging Review Dg Chest 2 View  07/07/2015  CLINICAL DATA:  Chest pain and shortness of breath beginning this morning. EXAM: CHEST  2 VIEW COMPARISON:   01/27/2015 FINDINGS: The heart size and mediastinal contours are within normal limits. Both lungs are clear. Previously seen patchy airspace disease in right lung base is no longer visualized. No evidence of pleural effusion. The visualized skeletal structures are unremarkable. IMPRESSION: No active cardiopulmonary disease. Electronically Signed   By: Earle Gell M.D.   On: 07/07/2015 11:26   I have personally reviewed and evaluated these images and lab results as part of my medical decision-making.   EKG Interpretation   Date/Time:  Tuesday July 07 2015 10:10:29 EDT Ventricular Rate:  89 PR Interval:  160 QRS Duration: 88 QT Interval:  334 QTC Calculation: 406 R Axis:   -64 Text Interpretation:  Normal sinus rhythm Left anterior fascicular block  Abnormal ECG Confirmed by YELVERTON  MD, DAVID ([id number]) on 07/07/2015  3:22:55 PM      Filed Vitals:   07/07/15 1145 07/07/15 1215 07/07/15 1245 07/07/15 1315  BP: 106/65 106/69 111/61 115/69  Pulse: 89 85 87 84  Temp:      TempSrc:      Resp: 22 19 20  21  Height:      Weight:      SpO2: 97% 98% 97% 98%     MDM   Meds given in ED:  Medications  aspirin chewable tablet 324 mg (324 mg Oral Given 07/07/15 1139)    New Prescriptions   No medications on file    Final diagnoses:  Chest pain, unspecified chest pain type   This  is a 40 y.o. male who presents to the ED complaining of chest pain starting today. 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 He reports he felt like he could not catch his breath. He reports feeling jittery and anxious. EMS was called to the scene and patient symptoms resolved. He was not transported to the ED. He reports going back to work and he felt his chest pain returned. He reports his chest pain is now resolved and she is only feeling jittery and anxious. He is unsure of his chest pain worsened with  exertion. He thinks it was worse with movement of his body.  Patient presented with chest pain to the ED. Patient is to be discharged with recommendation to follow up with PCP in regards to today's hospital visit. Chest pain is not likely of cardiac or pulmonary etiology due to presentation, perc negative, VSS, no tracheal deviation, no JVD or new murmur, RRR, breath sounds equal bilaterally, EKG without acute abnormalities, negative troponin, and delta troponin. Negative D-dimer. Unremarkable CXR. HEART score is 1. Patient has been advised to return to the ED if chest pain becomes exertional, associated with diaphoresis or nausea, radiates to left jaw/arm, worsens or becomes concerning in any way. Patient appears reliable for follow up and is agreeable to discharge. I advised the patient to follow-up with their primary care provider this week. I advised the patient to return to the emergency department with new or worsening symptoms or new concerns. The patient verbalized understanding and agreement with plan.       Waynetta Pean, PA-C 07/07/15 1532  Julianne Rice, MD 07/08/15 [phone]

## 2015-07-07 NOTE — ED Notes (Signed)
Pt states he understands instructions and is able to respond with teach back. Home stable.

## 2015-07-07 NOTE — ED Notes (Signed)
Returns from xray

## 2015-07-07 NOTE — Discharge Instructions (Signed)
Nonspecific Chest Pain  °Chest pain can be caused by many different conditions. There is always a chance that your pain could be related to something serious, such as a heart attack or a blood clot in your lungs. Chest pain can also be caused by conditions that are not life-threatening. If you have chest pain, it is very important to follow up with your health care provider. °CAUSES  °Chest pain can be caused by: °· Heartburn. °· Pneumonia or bronchitis. °· Anxiety or stress. °· Inflammation around your heart (pericarditis) or lung (pleuritis or pleurisy). °· A blood clot in your lung. °· A collapsed lung (pneumothorax). It can develop suddenly on its own (spontaneous pneumothorax) or from trauma to the chest. °· Shingles infection (varicella-zoster virus). °· Heart attack. °· Damage to the bones, muscles, and cartilage that make up your chest wall. This can include: °¨ Bruised bones due to injury. °¨ Strained muscles or cartilage due to frequent or repeated coughing or overwork. °¨ Fracture to one or more ribs. °¨ Sore cartilage due to inflammation (costochondritis). °RISK FACTORS  °Risk factors for chest pain may include: °· Activities that increase your risk for trauma or injury to your chest. °· Respiratory infections or conditions that cause frequent coughing. °· Medical conditions or overeating that can cause heartburn. °· Heart disease or family history of heart disease. °· Conditions or health behaviors that increase your risk of developing a blood clot. °· Having had chicken pox (varicella zoster). °SIGNS AND SYMPTOMS °Chest pain can feel like: °· Burning or tingling on the surface of your chest or deep in your chest. °· Crushing, pressure, aching, or squeezing pain. °· Dull or sharp pain that is worse when you move, cough, or take a deep breath. °· Pain that is also felt in your back, neck, shoulder, or arm, or pain that spreads to any of these areas. °Your chest pain may come and go, or it may stay  constant. °DIAGNOSIS °Lab tests or other studies may be needed to find the cause of your pain. Your health care provider may have you take a test called an ambulatory ECG (electrocardiogram). An ECG records your heartbeat patterns at the time the test is performed. You may also have other tests, such as: °· Transthoracic echocardiogram (TTE). During echocardiography, sound waves are used to create a picture of all of the heart structures and to look at how blood flows through your heart. °· Transesophageal echocardiogram (TEE). This is a more advanced imaging test that obtains images from inside your body. It allows your health care provider to see your heart in finer detail. °· Cardiac monitoring. This allows your health care provider to monitor your heart rate and rhythm in real time. °· Holter monitor. This is a portable device that records your heartbeat and can help to diagnose abnormal heartbeats. It allows your health care provider to track your heart activity for several days, if needed. °· Stress tests. These can be done through exercise or by taking medicine that makes your heart beat more quickly. °· Blood tests. °· Imaging tests. °TREATMENT  °Your treatment depends on what is causing your chest pain. Treatment may include: °· Medicines. These may include: °¨ Acid blockers for heartburn. °¨ Anti-inflammatory medicine. °¨ Pain medicine for inflammatory conditions. °¨ Antibiotic medicine, if an infection is present. °¨ Medicines to dissolve blood clots. °¨ Medicines to treat coronary artery disease. °· Supportive care for conditions that do not require medicines. This may include: °¨ Resting. °¨ Applying heat   or cold packs to injured areas. °¨ Limiting activities until pain decreases. °HOME CARE INSTRUCTIONS °· If you were prescribed an antibiotic medicine, finish it all even if you start to feel better. °· Avoid any activities that bring on chest pain. °· Do not use any tobacco products, including  cigarettes, chewing tobacco, or electronic cigarettes. If you need help quitting, ask your health care provider. °· Do not drink alcohol. °· Take medicines only as directed by your health care provider. °· Keep all follow-up visits as directed by your health care provider. This is important. This includes any further testing if your chest pain does not go away. °· If heartburn is the cause for your chest pain, you may be told to keep your head raised (elevated) while sleeping. This reduces the chance that acid will go from your stomach into your esophagus. °· Make lifestyle changes as directed by your health care provider. These may include: °¨ Getting regular exercise. Ask your health care provider to suggest some activities that are safe for you. °¨ Eating a heart-healthy diet. A registered dietitian can help you to learn healthy eating options. °¨ Maintaining a healthy weight. °¨ Managing diabetes, if necessary. °¨ Reducing stress. °SEEK MEDICAL CARE IF: °· Your chest pain does not go away after treatment. °· You have a rash with blisters on your chest. °· You have a fever. °SEEK IMMEDIATE MEDICAL CARE IF:  °· Your chest pain is worse. °· You have an increasing cough, or you cough up blood. °· You have severe abdominal pain. °· You have severe weakness. °· You faint. °· You have chills. °· You have sudden, unexplained chest discomfort. °· You have sudden, unexplained discomfort in your arms, back, neck, or jaw. °· You have shortness of breath at any time. °· You suddenly start to sweat, or your skin gets clammy. °· You feel nauseous or you vomit. °· You suddenly feel light-headed or dizzy. °· Your heart begins to beat quickly, or it feels like it is skipping beats. °These symptoms may represent a serious problem that is an emergency. Do not wait to see if the symptoms will go away. Get medical help right away. Call your local emergency services (911 in the [place] Do not drive yourself to the hospital. °  °This  information is not intended to replace advice given to you by your health care provider. Make sure you discuss any questions you have with your health care provider. °  °Document Released: 10/13/2004 Document Revised: 01/24/2014 Document Reviewed: 08/09/2013 °Elsevier Interactive Patient Education ©2016 [employer] ° °

## 2015-07-24 DIAGNOSIS — K219 Gastro-esophageal reflux disease without esophagitis: Secondary | ICD-10-CM | POA: Insufficient documentation

## 2015-08-20 ENCOUNTER — Ambulatory Visit (INDEPENDENT_AMBULATORY_CARE_PROVIDER_SITE_OTHER): Payer: BC Managed Care – PPO | Admitting: Family Medicine

## 2015-08-20 ENCOUNTER — Encounter: Payer: Self-pay | Admitting: Family Medicine

## 2015-08-20 VITALS — BP 150/90 | HR 72 | Temp 98.5°F | Ht 68.0 in | Wt 257.2 lb

## 2015-08-20 DIAGNOSIS — M25561 Pain in right knee: Secondary | ICD-10-CM

## 2015-08-20 MED ORDER — MELOXICAM 15 MG PO TABS: 15.0000 mg | ORAL_TABLET | Freq: Every day | ORAL | 0 refills | Status: DC

## 2015-08-20 NOTE — Patient Instructions (Signed)
Start wearing the supportive keen brace, that will help with stability. Continue to rest, ice, and elevate the knee.  I will refer you to Sports Medicine.  I have written you a note to stay out of work until Monday. I have prescribed you a Mobic, while you're on this do not take Ibuprofen, Advil, Naproxen, or Aleve.

## 2015-08-20 NOTE — Progress Notes (Signed)
Subjective:     Patient ID: Derrick Scott, male   DOB: 09-Jun-1975, 40 y.o.   MRN: [id number]  [name] is a male with a history of R meniscal tear s/p knee arthroscopy in 2014 (second procedure in 2015?) who presents to clinic today for right knee pain.  R Knee Pain The patient reports that he was at work 3 days prior on Monday  (he is a Education officer, environmental at [employer]) when he felt a 9/10 sharp pain 9/10 in his R knee. Since then, he has had intermittent knee pain around 5 times/day that lasts 5-10 minutes until proving on its own. He has tried icing, aquatic exercises, and ibuprofen 800mg , without relief of the episodes. He endorses catching of the knee and denies trauma, fever, N/V, headache, erythema, swelling, rash, infectious sx.     Review of Systems Pertinent positives and negatives per HPI.    Blood pressure (!) 150/90, pulse 72, temperature 98.5 F (36.9 C), height 5\' 8"  (1.727 m), weight 257 lb 3.2 oz (116.7 kg). Objective:   Physical Exam  Constitutional: He appears well-developed and well-nourished. No distress.  Cardiovascular: Normal rate and regular rhythm.   No murmur heard. Pulmonary/Chest: Effort normal and breath sounds normal. He has no wheezes. He exhibits no tenderness.  Musculoskeletal: He exhibits no edema.       Right shoulder: He exhibits pain. He exhibits normal range of motion, no tenderness, no swelling, no effusion and no crepitus.  Anterior and posterior drawer signs negative. McMurray test negative. Thessaly test positive.   Neurological: He has normal strength.   Of note, I believe the medical student meant Right Knee examination, not right shoulder above.     Assessment:     [name] is a male with a history of R meniscal tear s/p knee arthroscopy in 2014 who presents to clinic today for intermittent right knee pain. The patient's history, symptoms, and exam findings raise concern for another meniscal tear.     Plan:   R Knee Pain -concerning for meniscal tear -prescribed Mobic 15mg  daily -perform RICE techniques for relief and wear supportive knee brace -referred to sports medicine  RESIDENT ADDENDUM  I have separately seen and examined the patient. I have discussed the findings and exam with the medical student and agree with the above note, which I have edited appropriately. I helped develop the management plan that is described in the student's note, and I agree with the content.  Additionally I have outlined my exam and assessment/plan below:   The patient is presenting with a history of her right meniscal tell her status post arthroscopy in 2014 and reported repeat arthroscopy in 2015 is not in our system. He notes that the pain has been intermittent since Monday. It is a 9 out of 10 sharp pain. He notes that the pain will often come on with squatting, he does not notice any change in pain with ambulation or walking upstairs. He denies any swelling or erythema. He endorses catching of that right knee. He denies any popping sensation or the feeling like his knee is going to give out. He does note some instability of that right knee.  He has been doing aquatic exercises, elevating his leg, using ice, and ibuprofen 800 mg with mild improvement he believes, however the pain will resolve on its on sweets difficult to tell how much benefit the interventions really provide.  He denies any fevers or chills.  PE:  Vitals:  08/20/15 0931  Weight: 257 lb 3.2 oz (116.7 kg)  Height: 5\' 8"  (1.727 m)   Right knee: Normal to inspection without erythema, ecchymoses, effusion or obvious bony abnormalities with well healed scar.  No obvious Baker's cysts Palpation normal with no warmth or joint line tenderness or patellar tenderness or condyle tenderness.  No TTP along infrapatellar or pes anserine bursas.   ROM normal in flexion (135 degrees) and extension (0 degrees) and lower leg rotation. Ligaments with solid  consistent endpoints including ACL, PCL, LCL, MCL.  Negative Anterior Drawer/Lachman/Pivot Shift Negative Mcmurray's. Positive Thessaly.   Non painful patellar compression.  Normal Patellar glide.  No apprehension  Patellar and quadriceps tendons unremarkable. Hamstring and quadriceps strength is normal.   A/P:  This is a 40 year old male with a past medical history of a right meniscal tear status post arthroscopy presenting for new onset pain for the last 3 days. I'm concerned he has a partial meniscal tear at this time getting his symptoms and physical exam finding. -Discussed the findings with the patient. -The patient has a supportive knee brace from his prior surgery, advised him to start wearing this for stability. -Prescribed Mobic 15 mg daily. -Discussed Rice techniques. -Refer to sports medicine, the patient would like to have further evaluation and management and would like to avoid surgery if at all possible.   Archie Patten, MD PGY-3,  [facility] 08/21/2015  4:29 PM

## 2015-08-31 ENCOUNTER — Ambulatory Visit: Payer: BC Managed Care – PPO | Admitting: Sports Medicine

## 2015-10-05 ENCOUNTER — Other Ambulatory Visit: Payer: Self-pay | Admitting: *Deleted

## 2015-10-05 DIAGNOSIS — Z947 Corneal transplant status: Secondary | ICD-10-CM

## 2015-10-06 NOTE — Telephone Encounter (Signed)
2nd request.  Deborah Dondero L, RN  

## 2015-10-07 ENCOUNTER — Encounter: Payer: Self-pay | Admitting: Family Medicine

## 2015-10-07 ENCOUNTER — Ambulatory Visit (INDEPENDENT_AMBULATORY_CARE_PROVIDER_SITE_OTHER): Payer: BC Managed Care – PPO | Admitting: Family Medicine

## 2015-10-07 VITALS — BP 116/59 | HR 91 | Temp 98.7°F | Ht 64.0 in | Wt 259.8 lb

## 2015-10-07 DIAGNOSIS — S39012A Strain of muscle, fascia and tendon of lower back, initial encounter: Secondary | ICD-10-CM

## 2015-10-07 MED ORDER — CYCLOBENZAPRINE HCL 10 MG PO TABS: 10.0000 mg | ORAL_TABLET | Freq: Three times a day (TID) | ORAL | 0 refills | Status: DC | PRN

## 2015-10-07 NOTE — Patient Instructions (Signed)
It was a pleasure seeing you today in our clinic. Today we discussed your back pain. Here is the treatment plan we have discussed and agreed upon together:   - Try to ice this region 2-3 times a day for 20-30 minutes each time. - Take 2 tablets of Aleve (440 mg) twice a day as needed for pain. - Stay well-hydrated with water - Use the prescribed Flexeril for muscle tightness. Do not drive, use heavy machinery, or perform overhead lifting exercises while on this medication. - He should be feeling better in about 2 weeks.

## 2015-10-07 NOTE — Progress Notes (Signed)
   HPI  CC: Right-sided low back pain Working out in gym. Thinks he pulled something in back. Sore on Sat/Sun.  First noticed Sat afternoon. Was doing squats and leg press. No giving out or popping. "I kinda over did it". This exercise routine was not new but was "rushed". Denies any injury or trauma at that time. No fever, chills, nausea, vomiting, diarrhea, constipation, dysuria, weakness, numbness, or paresthesias. No gait changes.  Review of Systems   See HPI for ROS. All other systems reviewed and are negative.  CC, SH/smoking status, and VS noted  Objective: BP (!) 116/59   Pulse 91   Temp 98.7 F (37.1 C) (Oral)   Ht 5\' 4"  (1.626 m)   Wt 259 lb 12.8 oz (117.8 kg)   BMI 44.59 kg/m  Gen: NAD, alert, cooperative. CV: Well-perfused. Resp: Non-labored. Neuro: Sensation intact throughout. DTRs 2+ bilat Back: ROM intact. No bony abnormalities present. No warmth or erythema/ecchymoses. Tenderness noted along the right paraspinal region just superior to the SI joint. Straight leg raise negative. Strength and sensation intact bilaterally. Hip ROM intact without pain. No tenderness at the greater trochanter.   Assessment and plan:  Strain of lumbar paraspinal muscle Patient is here with signs and symptoms most consistent with a lumbar paraspinal muscle strain along the right side. Differential would include right-sided SI joint irritation. No red flag symptoms at this time. Will treat conservatively at this time. - Stay well-hydrated - NSAIDs for inflammation - Slow low intensity stretches - Icing as needed or heat. - Rest for 1-2 weeks. - Short course of Flexeril as needed for muscle spasms. Discussed avoidance of use when driving, operating heavy machinery, or overhead lifting.   Meds ordered this encounter  Medications  . cyclobenzaprine (FLEXERIL) 10 MG tablet    Sig: Take 1 tablet (10 mg total) by mouth 3 (three) times daily as needed for muscle spasms.    Dispense:  30  tablet    Refill:  0     Elberta Leatherwood, MD,MS,  PGY3 10/07/2015 3:53 PM

## 2015-10-07 NOTE — Assessment & Plan Note (Signed)
Patient is here with signs and symptoms most consistent with a lumbar paraspinal muscle strain along the right side. Differential would include right-sided SI joint irritation. No red flag symptoms at this time. Will treat conservatively at this time. - Stay well-hydrated - NSAIDs for inflammation - Slow low intensity stretches - Icing as needed or heat. - Rest for 1-2 weeks. - Short course of Flexeril as needed for muscle spasms. Discussed avoidance of use when driving, operating heavy machinery, or overhead lifting.

## 2015-10-08 ENCOUNTER — Telehealth: Payer: Self-pay | Admitting: Family Medicine

## 2015-10-08 NOTE — Telephone Encounter (Signed)
Pt called and would like the letter for his job to include Tuesday and Wednesday. He was seen yesterday. Patient would like to pick this up after 3 pm today. jw

## 2015-10-08 NOTE — Telephone Encounter (Signed)
Routing to doctor who saw pt to see if is ok to create this note for the dates mentioned.Derrick Scott, Derrick Scott, Derrick Scott

## 2015-10-09 NOTE — Telephone Encounter (Signed)
I informed him at the time of writing the note that it was only for the day of our visit. I will be gone all next week, if his PCP feels a retrospective work note is appropriate then they will need to write one since I won't be in town.

## 2015-10-19 ENCOUNTER — Emergency Department (HOSPITAL_COMMUNITY)
Admission: EM | Admit: 2015-10-19 | Discharge: 2015-10-19 | Disposition: A | Discharge status: Home / Self Care | Payer: BC Managed Care – PPO | Attending: Emergency Medicine | Admitting: Emergency Medicine

## 2015-10-19 ENCOUNTER — Encounter (HOSPITAL_COMMUNITY): Payer: Self-pay | Admitting: Emergency Medicine

## 2015-10-19 ENCOUNTER — Emergency Department (HOSPITAL_COMMUNITY): Payer: BC Managed Care – PPO

## 2015-10-19 DIAGNOSIS — Y990 Civilian activity done for income or pay: Secondary | ICD-10-CM | POA: Insufficient documentation

## 2015-10-19 DIAGNOSIS — S99912A Unspecified injury of left ankle, initial encounter: Secondary | ICD-10-CM | POA: Diagnosis present

## 2015-10-19 DIAGNOSIS — Y9289 Other specified places as the place of occurrence of the external cause: Secondary | ICD-10-CM | POA: Insufficient documentation

## 2015-10-19 DIAGNOSIS — X509XXA Other and unspecified overexertion or strenuous movements or postures, initial encounter: Secondary | ICD-10-CM | POA: Insufficient documentation

## 2015-10-19 DIAGNOSIS — Y939 Activity, unspecified: Secondary | ICD-10-CM | POA: Diagnosis not present

## 2015-10-19 DIAGNOSIS — S93402A Sprain of unspecified ligament of left ankle, initial encounter: Secondary | ICD-10-CM

## 2015-10-19 NOTE — ED Triage Notes (Signed)
Pt c/o left ankle pain. Pt states he sprained it at work Friday but continued working on it. Last night he stepped on a rock and twisted it. Pt states it "just doesn't feel right." Pt states he has been icing it and taking ibuprofen for pain.

## 2015-10-19 NOTE — ED Provider Notes (Signed)
[facility] DEPT Provider Note   CSN: [id number] Arrival date & time: 10/19/15  1110  By signing my name below, I, Soijett Blue, attest that this documentation has been prepared under the direction and in the presence of Gloriann Loan, PA-C Electronically Signed: Soijett Blue, ED Scribe. 10/19/15. 12:21 PM.   History   Chief Complaint Chief Complaint  Patient presents with  . Ankle Pain    HPI  Derrick Scott is a 40 y.o. male who presents to the Emergency Department complaining of left ankle pain onset 3 days ago worsening yesterday. Pt reports that he initially "sprained" his left ankle while at work, but he continued to work on it. Pt notes that he re-injured his left ankle by twisting it on a rock while ambulating outside with his dog yesterday. Pt denies falling during the incident. Pt is having associated symptoms of resolved left ankle swelling. He notes that he has tried ibuprofen and ice with no relief of his symptoms. He denies color change, wound, rash, hitting his head, LOC, numbness, weakness, gait problem, and any other symptoms.    The history is provided by the patient. No language interpreter was used.    Past Medical History:  Diagnosis Date  . Medial meniscus tear 01/2012   right knee  . Pneumonia   . Renal disorder   . Vertigo, benign paroxysmal 2015    Patient Active Problem List   Diagnosis Date Noted  . Strain of lumbar paraspinal muscle 10/07/2015  . Headache 01/29/2015  . Post corneal transplant 07/25/2014  . 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 BPPV (benign paroxysmal positional vertigo) 08/30/2013  . Adrenal mass, right ([place]) 05/22/2013  . Flank pain 04/23/2013  . Elbow pain, left 02/22/2013  . Knee pain, right 02/15/2012  . OBSTRUCTIVE SLEEP APNEA 07/31/2009  . OBESITY, NOS 03/16/2006  . ANKLE, PAINFUL ARTHRALGIA 03/16/2006    Past Surgical History:  Procedure Laterality Date  . CORNEAL TRANSPLANT  12/2009  . KNEE  ARTHROSCOPY  02/20/2012   Procedure: ARTHROSCOPY KNEE;  Surgeon: Alta Corning, MD;  Location: [facility];  Service: Orthopedics;  Laterality: Right;  Chondroplasia patella femoral joint, medial meniscal debriedment       Home Medications    Prior to Admission medications   Medication Sig Start Date End Date Taking? Authorizing Provider  cyclobenzaprine (FLEXERIL) 10 MG tablet Take 1 tablet (10 mg total) by mouth 3 (three) times daily as needed for muscle spasms. 10/07/15   Elberta Leatherwood, MD  meloxicam (MOBIC) 15 MG tablet Take 1 tablet (15 mg total) by mouth daily. 08/20/15   Archie Patten, MD    Family History Family History  Problem Relation Age of Onset  . Asthma Mother   . Diabetes Father   . Cancer Father     prostate  . Osteoarthritis Sister   . Diabetes Sister     Social History Social History  Substance Use Topics  . Smoking status: Never Smoker  . Smokeless tobacco: Never Used  . Alcohol use No     Allergies   Review of patient's allergies indicates no known allergies.   Review of Systems Review of Systems  Musculoskeletal: Positive for arthralgias (left ankle) and joint swelling (resolved left ankle). Negative for gait problem.  Skin: Negative for color change, rash and wound.  Neurological: Negative for syncope, weakness and numbness.     Physical Exam Updated Vital Signs BP 120/86 (BP Location: Right Arm)  Pulse 86   Temp 98.3 F (36.8 C) (Oral)   Resp 16   Ht 5\' 4"  (1.626 m)   Wt 117.5 kg   SpO2 96%   BMI 44.46 kg/m   Physical Exam  Constitutional: He is oriented to person, place, and time. He appears well-developed and well-nourished.  HENT:  Head: Normocephalic and atraumatic.  Right Ear: External ear normal.  Left Ear: External ear normal.  Eyes: Conjunctivae are normal. No scleral icterus.  Neck: No tracheal deviation present.  Cardiovascular:  Pulses:      Dorsalis pedis pulses are 2+ on the right side, and 2+ on  the left side.  Pulmonary/Chest: Effort normal. No respiratory distress.  Abdominal: He exhibits no distension.  Musculoskeletal: Normal range of motion. He exhibits tenderness.       Right ankle: Normal.       Left ankle: He exhibits normal range of motion, no swelling, no deformity, no laceration and normal pulse. Tenderness.       Feet:  Neurological: He is alert and oriented to person, place, and time.  Strength and sensation intact throughout lower extremities.   Skin: Skin is warm and dry.  Psychiatric: He has a normal mood and affect. His behavior is normal.     ED Treatments / Results  DIAGNOSTIC STUDIES: Oxygen Saturation is 96% on RA, nl by my interpretation.    COORDINATION OF CARE: 12:19 PM Discussed treatment plan with pt at bedside which includes left ankle xray, referral and follow up with orthopedist, ASO brace, and pt agreed to plan.   Radiology Dg Ankle Complete Left  Result Date: 10/19/2015 CLINICAL DATA:  Left ankle twisting injuries 3 days ago as well as last night. Soft tissue swelling laterally. Initial encounter. EXAM: LEFT ANKLE COMPLETE - 3+ VIEW COMPARISON:  02/05/2013 FINDINGS: No acute fracture or dislocation is identified. Diminutive plantar and posterior calcaneal enthesophytes are again seen. Mild dorsal talonavicular spurring. Bone mineralization appears normal. No soft tissue abnormality is identified. IMPRESSION: No evidence of acute osseous abnormality. Electronically Signed   By: Logan Bores M.D.   On: 10/19/2015 11:57    Procedures Procedures (including critical care time)  Medications Ordered in ED Medications - No data to display   Initial Impression / Assessment and Plan / ED Course  I have reviewed the triage vital signs and the nursing notes.  Pertinent imaging results that were available during my care of the patient were reviewed by me and considered in my medical decision making (see chart for details).  Clinical Course    Patient X-Ray negative for obvious fracture or dislocation.  Pt advised to follow up with orthopedics. Patient given ASO brace while in ED, conservative therapy recommended and discussed. Patient will be discharged home & is agreeable with above plan. Returns precautions discussed. Pt appears safe for discharge.  Final Clinical Impressions(s) / ED Diagnoses   Final diagnoses:  Sprain of left ankle, unspecified ligament, initial encounter    New Prescriptions New Prescriptions   No medications on file   I personally performed the services described in this documentation, which was scribed in my presence. The recorded information has been reviewed and is accurate.     Gloriann Loan, PA-C 10/19/15 [place], MD 10/19/15 [phone]

## 2015-10-19 NOTE — Discharge Instructions (Signed)
Your xray is normal today.  Continue taking Ibuprofen three times daily.  You may also take Tylenol every 6 hours.  Continue icing 3 times a day.  Wear the ASO while walking or other activities.  Follow up with orthopedics if your pain persists.  Return to the ED if you experience sudden worsening pain, inability to walk, numbness, weakness, or any new symptoms.

## 2015-12-02 ENCOUNTER — Ambulatory Visit (INDEPENDENT_AMBULATORY_CARE_PROVIDER_SITE_OTHER): Payer: BC Managed Care – PPO | Admitting: Family Medicine

## 2015-12-02 ENCOUNTER — Encounter: Payer: Self-pay | Admitting: Family Medicine

## 2015-12-02 ENCOUNTER — Ambulatory Visit (HOSPITAL_COMMUNITY)
Admission: RE | Admit: 2015-12-02 | Discharge: 2015-12-02 | Disposition: A | Discharge status: Home / Self Care | Payer: BC Managed Care – PPO | Source: Ambulatory Visit | Attending: Family Medicine | Admitting: Family Medicine

## 2015-12-02 VITALS — BP 124/77 | HR 86 | Temp 98.2°F | Ht 68.0 in | Wt 264.0 lb

## 2015-12-02 DIAGNOSIS — M545 Low back pain, unspecified: Secondary | ICD-10-CM

## 2015-12-02 DIAGNOSIS — R05 Cough: Secondary | ICD-10-CM | POA: Diagnosis not present

## 2015-12-02 DIAGNOSIS — R10A0 Flank pain, unspecified side: Secondary | ICD-10-CM

## 2015-12-02 DIAGNOSIS — R059 Cough, unspecified: Secondary | ICD-10-CM

## 2015-12-02 DIAGNOSIS — R109 Unspecified abdominal pain: Secondary | ICD-10-CM

## 2015-12-02 LAB — POCT URINALYSIS DIPSTICK
Glucose, UA: NEGATIVE
Leukocytes, UA: NEGATIVE
NITRITE UA: NEGATIVE
PH UA: 5.5
PROTEIN UA: 30
Spec Grav, UA: >=1.03
UROBILINOGEN UA: 0.2

## 2015-12-02 LAB — POCT UA - MICROSCOPIC ONLY

## 2015-12-02 NOTE — Progress Notes (Signed)
Subjective:  Derrick Scott is a 40 y.o. male who presents to the [facility] today with a chief complaint of   HPI:  Cough: Patient presenting with 2 weeks of cough after recently getting over a cold.  Cough is described as dry and cleared up this past Friday.  He denies any fevers or chills. Denies any chest pain, shortness of breath or swelling or pain in his extremities.  He has no history of DVTs.  He did not receive a flu shot this year.  Left sided pain:  Patient notes left upper quadrant and left flank pain for the past several days that is worse with inspiration and movement.  He works as a Development worker, international aid.  He is concerned that this may be a pneumonia, because it feels similar to when he was diagnosed with this earlier this year. Does have a history of kidney stones however states this does not feel similar.  Denies any dysuria, hematuria or increased frequency of urination.  Has not tried any analgesia, but was in enough discomfort today to not go to work. Denies any numbness or tingling going down to extremities and denies any trauma.  He does not have any new rashes.    PMH: Obesity, OSA Tobacco use: Never smoked Medication: reviewed and updated ROS: see HPI   Objective:  Physical Exam: BP 124/77 (BP Location: Left Wrist, Patient Position: Sitting, Cuff Size: Normal)   Pulse 86   Temp 98.2 F (36.8 C) (Oral)   Ht 5\' 8"  (1.727 m)   Wt 264 lb (119.7 kg)   SpO2 96%   BMI 40.14 kg/m   Gen: 40 year old male in NAD, resting comfortably CV: RRR with no murmurs appreciated Pulm: NWOB, CTAB with no crackles, wheezes, or rhonchi GI: Normal bowel sounds present. Soft mildly tender left upper quadrant without rebounding or peritoneal signs and no distention. MSK: Full range of motion of trunk without discomfort, no tenderness to palpation of paraspinal muscles in no bony abnormalities of spine. No CVA tenderness bilaterally, but does have some left flank pain with forceful pressure.   Patient is without intercostal tenderness.  Skin: warm, dry Neuro: grossly normal, moves all extremities Psych: Normal affect and thought content  Results for orders placed or performed in visit on 12/02/15 (from the past 72 hour(s))  POCT UA - Microscopic Only     Status: None   Collection Time: 12/02/15 12:22 PM  Result Value Ref Range   WBC, Ur, HPF, POC NONE    RBC, urine, microscopic 1-3    Bacteria, U Microscopic TRACE    Epithelial cells, urine per micros RARE   POCT urinalysis dipstick     Status: Abnormal   Collection Time: 12/02/15 12:24 PM  Result Value Ref Range   Color, UA YELLOW    Clarity, UA CLEAR    Glucose, UA NEG    Bilirubin, UA SMALL    Ketones, UA TRACE    Spec Grav, UA >=1.030    Blood, UA SMALL    pH, UA 5.5    Protein, UA 30    Urobilinogen, UA 0.2    Nitrite, UA NEG    Leukocytes, UA Negative Negative     Assessment/Plan:  Cough Reports 2 weeks of dry cough after a recent upper respiratory infection and is without fevers or chills.  Lung exam was unremarkable patient's vital signs are stable and he has no swelling or pain in his extremities.  He does of a history of pneumonia  and claims that his associated left-sided pain feels similar to a pneumonia.  Opted to get 2 view chest x-ray, which revealed no infiltrates.  Very likely to be lingering post viral cough. - Should be self-limiting   Flank pain Left-sided flank pain and left upper quadrant pain for the past few days worse with inspiration or movement. Patient was concerned this may be a pneumonia, however chest x-ray was normal.  Performed UA, which showed small amount of RBCs, which can be normal in young patients.  He denies any gross hematuria or pain that is colicky in nature. This is very likely to be a muscle strain and should resolve in 4-6 weeks with Tyleno, ibuprofen and heating pad - Recommended Tylenol and ibuprofen as needed as well as heating pad - Discussed return precautions and  recommended follow-up in 4-6 weeks if pain persists

## 2015-12-02 NOTE — Patient Instructions (Signed)
Derrick Scott, you were seen today for lingering cough and pain in your left back.  This might be a musculoskeletal issue or this could be a minor pneumonia. You can go over to the hospital for chest x-ray and I will let you know what these results are.  I will also be checking your urine for urinary tract infection or signs of the kidney stone.  If both these come back negative, I would recommend using Tylenol and ibuprofen and heating pad and in that case the muscle pain will resolve on it's own.  I will be in touch with you regarding your results of your chest x-ray and urine.  Take care, Symphonie Schneiderman L. Rosalyn Gess, [place] Resident PGY-1 12/02/2015 12:17 PM

## 2015-12-03 ENCOUNTER — Other Ambulatory Visit: Payer: Self-pay | Admitting: Family Medicine

## 2015-12-03 ENCOUNTER — Encounter: Payer: Self-pay | Admitting: Family Medicine

## 2015-12-03 ENCOUNTER — Telehealth: Payer: Self-pay | Admitting: Family Medicine

## 2015-12-03 NOTE — Telephone Encounter (Signed)
Spoke with Derrick Scott on the phone and let him know that his results were normal. He felt very stiff this morning and asked for a new letter excusing him from work.  I agreed to write this and left it up front for him.

## 2015-12-04 ENCOUNTER — Encounter: Payer: Self-pay | Admitting: Family Medicine

## 2015-12-04 DIAGNOSIS — R059 Cough, unspecified: Secondary | ICD-10-CM | POA: Insufficient documentation

## 2015-12-04 DIAGNOSIS — R05 Cough: Secondary | ICD-10-CM | POA: Insufficient documentation

## 2015-12-04 NOTE — Assessment & Plan Note (Signed)
Reports 2 weeks of dry cough after a recent upper respiratory infection and is without fevers or chills.  Lung exam was unremarkable patient's vital signs are stable and he has no swelling or pain in his extremities.  He does of a history of pneumonia and claims that his associated left-sided pain feels similar to a pneumonia.  Opted to get 2 view chest x-ray, which revealed no infiltrates.  Very likely to be lingering post viral cough. - Should be self-limiting

## 2015-12-04 NOTE — Assessment & Plan Note (Signed)
Left-sided flank pain and left upper quadrant pain for the past few days worse with inspiration or movement. Patient was concerned this may be a pneumonia, however chest x-ray was normal.  Performed UA, which showed small amount of RBCs, which can be normal in young patients.  He denies any gross hematuria or pain that is colicky in nature. This is very likely to be a muscle strain and should resolve in 4-6 weeks with Tyleno, ibuprofen and heating pad - Recommended Tylenol and ibuprofen as needed as well as heating pad - Discussed return precautions and recommended follow-up in 4-6 weeks if pain persists

## 2015-12-18 ENCOUNTER — Ambulatory Visit (INDEPENDENT_AMBULATORY_CARE_PROVIDER_SITE_OTHER): Payer: BC Managed Care – PPO | Admitting: *Deleted

## 2015-12-18 DIAGNOSIS — Z23 Encounter for immunization: Secondary | ICD-10-CM

## 2015-12-18 NOTE — Progress Notes (Signed)
   Derrick Scott presents for immunizations.    Screening questions for immunizations: 1. Are you sick today?  no 2. Do you have allergies to medications, foods, or any vaccines?  no 3. Have you ever had a serious reaction after receiving a vaccination?  no 4. Do you have a long-term health problem with heart disease, asthma, lung disease, kidney disease, metabolic disease (e.g. diabetes), anemia, or other blood disorder?  no 5. Have you had a seizure, brain problem, or other nervous system problem?  no 6. Do you have cancer, leukemia, AIDS, or any other immune system problem?  no 7. Do you take cortisone, prednisone, other steroids, anticancer drugs or have you had radiation treatments?  no 8. Have you received a transfusion of blood or blood products, or been given immune (gamma) globulin or an antiviral drug in the past year?  no 9. Have you received vaccinations in the past 4 weeks?  no 10. FEMALES ONLY: Are you pregnant or is there a chance you could become pregnant during the next month?  no   Derl Barrow, RN

## 2016-03-09 ENCOUNTER — Emergency Department (HOSPITAL_BASED_OUTPATIENT_CLINIC_OR_DEPARTMENT_OTHER): Payer: BC Managed Care – PPO

## 2016-03-09 ENCOUNTER — Encounter (HOSPITAL_BASED_OUTPATIENT_CLINIC_OR_DEPARTMENT_OTHER): Payer: Self-pay

## 2016-03-09 ENCOUNTER — Emergency Department (HOSPITAL_BASED_OUTPATIENT_CLINIC_OR_DEPARTMENT_OTHER)
Admission: EM | Admit: 2016-03-09 | Discharge: 2016-03-09 | Disposition: A | Discharge status: Home / Self Care | Payer: BC Managed Care – PPO | Attending: Emergency Medicine | Admitting: Emergency Medicine

## 2016-03-09 DIAGNOSIS — Y939 Activity, unspecified: Secondary | ICD-10-CM | POA: Diagnosis not present

## 2016-03-09 DIAGNOSIS — M25572 Pain in left ankle and joints of left foot: Secondary | ICD-10-CM | POA: Insufficient documentation

## 2016-03-09 DIAGNOSIS — X501XXA Overexertion from prolonged static or awkward postures, initial encounter: Secondary | ICD-10-CM | POA: Insufficient documentation

## 2016-03-09 DIAGNOSIS — Y999 Unspecified external cause status: Secondary | ICD-10-CM | POA: Diagnosis not present

## 2016-03-09 DIAGNOSIS — Y929 Unspecified place or not applicable: Secondary | ICD-10-CM | POA: Insufficient documentation

## 2016-03-09 DIAGNOSIS — S99912A Unspecified injury of left ankle, initial encounter: Secondary | ICD-10-CM | POA: Diagnosis present

## 2016-03-09 NOTE — ED Triage Notes (Signed)
Stepped off steps twisted left ankle last night-NAD-steady gait

## 2016-03-09 NOTE — Discharge Instructions (Signed)
Please use rest, ice, compression, elevation as you are. Use ibuprofen or naproxen as needed for pain and swelling. Follow-up with your primary care provider in 1-2 weeks as necessary.  Get help right away if: Your foot, leg, toes, or ankle tingles or becomes numb. Your foot, leg, toes, or ankle becomes swollen. Your foot, leg, toes, or ankle turns pale or blue.

## 2016-03-09 NOTE — ED Provider Notes (Signed)
[facility]PT [facility] Provider Note   CSN: [id number] Arrival date & time: 03/09/16  1125     History   Chief Complaint Chief Complaint  Patient presents with  . Ankle Injury    HPI Elier Sebastiani II is a 41 y.o. male with history of twisting his left ankle presenting today with complaints of left ankle pain since last night after twisting it. He describes a dull, constant, unchanged 5/10. He states that walking moving it causes a sharp 8/10 pain. He reports associated limited ROM due to pain and discomfort. He denies any swelling, bruising, tingling, numbness, fever, chills, nausea, vomiting. He reports being able to stand and walk on it. He reports trying rest, ice, compression, and elevation for left ankle with moderate relief. He reports trying ibuprofen with minimal relief. He reports stepping down from stairs onto a grass portion on the ground and twisted ankle on a piece of wood that was on the ground twisting his ankle inwards.  The history is provided by the patient. No language interpreter was used.  Ankle Injury     Past Medical History:  Diagnosis Date  . Medial meniscus tear 01/2012   right knee  . Pneumonia   . Renal disorder   . Vertigo, benign paroxysmal 2015    Patient Active Problem List   Diagnosis Date Noted  . Cough 12/04/2015  . Strain of lumbar paraspinal muscle 10/07/2015  . Headache 01/29/2015  . Post corneal transplant 07/25/2014  . 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 BPPV (benign paroxysmal positional vertigo) 08/30/2013  . Adrenal mass, right ([place]) 05/22/2013  . Flank pain 04/23/2013  . Elbow pain, left 02/22/2013  . Knee pain, right 02/15/2012  . OBSTRUCTIVE SLEEP APNEA 07/31/2009  . OBESITY, NOS 03/16/2006  . ANKLE, PAINFUL ARTHRALGIA 03/16/2006    Past Surgical History:  Procedure Laterality Date  . CORNEAL TRANSPLANT  12/2009  . KNEE ARTHROSCOPY  02/20/2012   Procedure: ARTHROSCOPY KNEE;  Surgeon: Alta Corning,  MD;  Location: [facility];  Service: Orthopedics;  Laterality: Right;  Chondroplasia patella femoral joint, medial meniscal debriedment       Home Medications    Prior to Admission medications   Not on File    Family History Family History  Problem Relation Age of Onset  . Asthma Mother   . Diabetes Father   . Cancer Father     prostate  . Osteoarthritis Sister   . Diabetes Sister     Social History Social History  Substance Use Topics  . Smoking status: Never Smoker  . Smokeless tobacco: Never Used  . Alcohol use No     Allergies   Patient has no known allergies.   Review of Systems Review of Systems  Constitutional: Negative for chills and fever.  Musculoskeletal: Positive for arthralgias.  Skin: Negative for rash.     Physical Exam Updated Vital Signs BP 127/78 (BP Location: Left Arm)   Pulse 86   Temp 99.1 F (37.3 C) (Oral)   Resp 18   Ht 5\' 7"  (1.702 m)   Wt 118.8 kg   SpO2 98%   BMI 41.04 kg/m   Physical Exam  Constitutional: He is oriented to person, place, and time. He appears well-developed and well-nourished.  Well appearing  HENT:  Head: Normocephalic and atraumatic.  Nose: Nose normal.  Eyes: Conjunctivae and EOM are normal.  Neck: Normal range of motion.  Cardiovascular: Normal rate and intact distal pulses.  Pulmonary/Chest: Effort normal. No respiratory distress.  Normal work of breathing. No respiratory distress noted.   Abdominal: Soft.  Musculoskeletal: Normal range of motion.  Full ROM to left ankle. No obvious swelling, no redness or discoloration. Mild tenderness to inferior lateral malleolus. No tenderness to proximal fibula.  Neurological: He is alert and oriented to person, place, and time.  Sensation intact to left distal foot. Muscle strength 5/5 to left foot and ankle. Good strength on dosiflexion and plantar flexion against resistance.   Skin: Skin is warm.  Psychiatric: He has a normal mood and  affect. His behavior is normal.  Nursing note and vitals reviewed.    ED Treatments / Results  Labs (all labs ordered are listed, but only abnormal results are displayed) Labs Reviewed - No data to display  EKG  EKG Interpretation None       Radiology Dg Ankle Complete Left  Result Date: 03/09/2016 CLINICAL DATA:  41 year old male status post twisting injury last night. Dorsal foot and bilateral ankle pain. Initial encounter. EXAM: LEFT ANKLE COMPLETE - 3+ VIEW COMPARISON:  Left ankle series 10217. FINDINGS: Stable mortise joint alignment. Talar dome intact. Small chronic ossific fragments and mild degenerative spurring about the medial malleolus appears stable (image 2). Chronic degenerative spurring at the anterior talus. No definite joint effusion. Calcaneus intact with chronic degenerative spurring. No acute fracture in the distal tibia or fibula. Visible left foot osseous structures appear intact. IMPRESSION: No acute osseous abnormality identified. Chronic posttraumatic and degenerative changes. Electronically Signed   By: Genevie Ann M.D.   On: 03/09/2016 11:55    Procedures Procedures (including critical care time)  Medications Ordered in ED Medications - No data to display   Initial Impression / Assessment and Plan / ED Course  I have reviewed the triage vital signs and the nursing notes.  Pertinent labs & imaging results that were available during my care of the patient were reviewed by me and considered in my medical decision making (see chart for details).    Left ankle sprain.  Xray visualized by me and no fracture, suspect sprain.  Patient given Ace wrap here in ED. Patient declined crutches.  WBAT.  Discussed f/u in 1 wk for recheck and reasons to return immediately.   Final Clinical Impressions(s) / ED Diagnoses   Final diagnoses:  Acute left ankle pain    New Prescriptions New Prescriptions   No medications on file     [place],  [place] 03/09/16 [place], MD 03/10/16 [phone]

## 2016-03-23 ENCOUNTER — Ambulatory Visit (INDEPENDENT_AMBULATORY_CARE_PROVIDER_SITE_OTHER): Payer: BC Managed Care – PPO | Admitting: Family Medicine

## 2016-03-23 ENCOUNTER — Encounter: Payer: Self-pay | Admitting: Family Medicine

## 2016-03-23 DIAGNOSIS — M25562 Pain in left knee: Secondary | ICD-10-CM

## 2016-03-23 NOTE — Patient Instructions (Signed)
You have a distal hamstring tendon strain/spasms with some localized nerve irritation. Consider compression sleeve when up and walking around for next 6 weeks. Aleve 2 tabs twice a day with food OR ibuprofen 600mg  three times a day with food - take for 7-10 days then as needed. Heat 15 minutes at a time 3-4 times a day. Leg curls, hamstring swings, running lunges - add 2 pound weight with time if these are too easy. 3 sets of 10 once or twice a day. Consider physical therapy as well. Follow up with me in 1 month to 6 weeks for reevaluation.

## 2016-03-25 DIAGNOSIS — M25562 Pain in left knee: Secondary | ICD-10-CM | POA: Insufficient documentation

## 2016-03-25 NOTE — Assessment & Plan Note (Signed)
2/2 hamstring tendon strain/spasms.  Consider compression sleeve.  Aleve or ibuprofen.  Heat for spasms.  Shown home exercises to do daily.  Consider physical therapy.  Activities as tolerated.  F/u in 1 month to 6 weeks.

## 2016-03-25 NOTE — Progress Notes (Signed)
PCP: [name], MD  Subjective:   HPI: Patient is a 41 y.o. male here for left knee pain.  Patient denies known injury or trauma. He reports about 1 week ago he developed burning pain that was anterior and deep initially but since localized to medial posterior knee. Worse with working - sharp Pain comes and goes. Worse with ambulation also. Has been taking ibuprofen, icing. No skin changes.  Had some numbness locally. No catching, locking, instability.  Past Medical History:  Diagnosis Date  . Medial meniscus tear 01/2012   right knee  . Pneumonia   . Renal disorder   . Vertigo, benign paroxysmal 2015    No current outpatient prescriptions on file prior to visit.   No current facility-administered medications on file prior to visit.     Past Surgical History:  Procedure Laterality Date  . CORNEAL TRANSPLANT  12/2009  . KNEE ARTHROSCOPY  02/20/2012   Procedure: ARTHROSCOPY KNEE;  Surgeon: Alta Corning, MD;  Location: [facility];  Service: Orthopedics;  Laterality: Right;  Chondroplasia patella femoral joint, medial meniscal debriedment    No Known Allergies  Social History   Social History  . Marital status: Legally Separated    Spouse name: N/A  . Number of children: N/A  . Years of education: N/A   Occupational History  . Not on file.   Social History Main Topics  . Smoking status: Never Smoker  . Smokeless tobacco: Never Used  . Alcohol use No  . Drug use: No  . Sexual activity: Not on file   Other Topics Concern  . Not on file   Social History Narrative  . No narrative on file    Family History  Problem Relation Age of Onset  . Asthma Mother   . Diabetes Father   . Cancer Father     prostate  . Osteoarthritis Sister   . Diabetes Sister     BP 124/84   Pulse 89   Ht 5\' 8"  (1.727 m)   Wt 265 lb (120.2 kg)   BMI 40.29 kg/m   Review of Systems: See HPI above.     Objective:  Physical Exam:  Gen: NAD, comfortable in  exam room  Left knee: No gross deformity, ecchymoses, effusion. TTP medial hamstring and tendon distally.  No joint line, other tenderness. FROM with pain on resisted knee extension at 30 degrees, weakness. Negative ant/post drawers. Negative valgus/varus testing. Negative lachmanns. Negative mcmurrays, apleys, patellar apprehension. NV intact distally.  Right knee: FROM without pain.   Assessment & Plan:  1. Left knee pain - 2/2 hamstring tendon strain/spasms.  Consider compression sleeve.  Aleve or ibuprofen.  Heat for spasms.  Shown home exercises to do daily.  Consider physical therapy.  Activities as tolerated.  F/u in 1 month to 6 weeks.

## 2016-04-02 ENCOUNTER — Emergency Department (HOSPITAL_BASED_OUTPATIENT_CLINIC_OR_DEPARTMENT_OTHER)
Admission: EM | Admit: 2016-04-02 | Discharge: 2016-04-02 | Disposition: A | Discharge status: Home / Self Care | Payer: BC Managed Care – PPO | Attending: Emergency Medicine | Admitting: Emergency Medicine

## 2016-04-02 ENCOUNTER — Encounter (HOSPITAL_BASED_OUTPATIENT_CLINIC_OR_DEPARTMENT_OTHER): Payer: Self-pay | Admitting: *Deleted

## 2016-04-02 ENCOUNTER — Emergency Department (HOSPITAL_BASED_OUTPATIENT_CLINIC_OR_DEPARTMENT_OTHER): Payer: BC Managed Care – PPO

## 2016-04-02 DIAGNOSIS — B349 Viral infection, unspecified: Secondary | ICD-10-CM | POA: Diagnosis not present

## 2016-04-02 DIAGNOSIS — R05 Cough: Secondary | ICD-10-CM | POA: Diagnosis present

## 2016-04-02 DIAGNOSIS — R059 Cough, unspecified: Secondary | ICD-10-CM

## 2016-04-02 LAB — RAPID STREP SCREEN (MED CTR MEBANE ONLY): STREPTOCOCCUS, GROUP A SCREEN (DIRECT): NEGATIVE

## 2016-04-02 MED ORDER — IBUPROFEN 800 MG PO TABS
800.0000 mg | ORAL_TABLET | Freq: Once | ORAL | Status: AC
  Administered 2016-04-02: 800 mg via ORAL
  Filled 2016-04-02: qty 1

## 2016-04-02 MED ORDER — IBUPROFEN 800 MG PO TABS: 800.0000 mg | ORAL_TABLET | Freq: Three times a day (TID) | ORAL | 0 refills | Status: DC | PRN

## 2016-04-02 NOTE — Discharge Instructions (Signed)
Alternate between Tylenol and ibuprofen as needed for fevers, body aches.  Increase hydration. Follow-up with your primary care provider if symptoms do not improve in the next 3-5 days. Return to ER for new or worsening symptoms, any additional concerns.

## 2016-04-02 NOTE — ED Triage Notes (Signed)
Pt reports generalized body aches and fever x 1 day.

## 2016-04-02 NOTE — ED Provider Notes (Signed)
[facility] DEPT [facility] Provider Note   CSN: [id number] Arrival date & time: 04/02/16  1213     History   Chief Complaint Chief Complaint  Patient presents with  . Generalized Body Aches    HPI Derrick Scott is a 41 y.o. male.  The history is provided by the patient and medical records. No language interpreter was used.   Derrick Scott is a 41 y.o. male  who presents to the Emergency Department complaining of acute onset of generalized body aches associated with fever, sore throat, cough which began yesterday. He took Tylenol last night with improvement in fever, but not body aches. No medications taken today for symptoms. Denies sick contacts. Did not receive flu vaccine this year.   Past Medical History:  Diagnosis Date  . Medial meniscus tear 01/2012   right knee  . Pneumonia   . Renal disorder   . Vertigo, benign paroxysmal 2015    Patient Active Problem List   Diagnosis Date Noted  . Left knee pain 03/25/2016  . Cough 12/04/2015  . Strain of lumbar paraspinal muscle 10/07/2015  . Laryngopharyngeal reflux (LPR) 07/24/2015  . Headache 01/29/2015  . Post corneal transplant 07/25/2014  . 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 BPPV (benign paroxysmal positional vertigo) 08/30/2013  . Adrenal mass, right ([place]) 05/22/2013  . Flank pain 04/23/2013  . Elbow pain, left 02/22/2013  . Knee pain, right 02/15/2012  . OBSTRUCTIVE SLEEP APNEA 07/31/2009  . OBESITY, NOS 03/16/2006  . ANKLE, PAINFUL ARTHRALGIA 03/16/2006    Past Surgical History:  Procedure Laterality Date  . CORNEAL TRANSPLANT  12/2009  . KNEE ARTHROSCOPY  02/20/2012   Procedure: ARTHROSCOPY KNEE;  Surgeon: Alta Corning, MD;  Location: [facility];  Service: Orthopedics;  Laterality: Right;  Chondroplasia patella femoral joint, medial meniscal debriedment       Home Medications    Prior to Admission medications   Medication Sig Start Date End Date Taking?  Authorizing Provider  ibuprofen (ADVIL,MOTRIN) 800 MG tablet Take 1 tablet (800 mg total) by mouth every 8 (eight) hours as needed (fever, body aches). 04/02/16   [place], PA-C    Family History Family History  Problem Relation Age of Onset  . Asthma Mother   . Diabetes Father   . Cancer Father     prostate  . Osteoarthritis Sister   . Diabetes Sister     Social History Social History  Substance Use Topics  . Smoking status: Never Smoker  . Smokeless tobacco: Never Used  . Alcohol use No     Allergies   Patient has no known allergies.   Review of Systems Review of Systems  Constitutional: Positive for fever. Negative for chills.  HENT: Positive for congestion and sore throat.   Eyes: Negative for visual disturbance.  Respiratory: Positive for cough. Negative for shortness of breath.   Cardiovascular: Negative for chest pain.  Gastrointestinal: Negative for abdominal pain, nausea and vomiting.  Genitourinary: Negative for dysuria.  Musculoskeletal: Positive for myalgias. Negative for neck pain.  Skin: Negative for rash.  Neurological: Negative for dizziness.     Physical Exam Updated Vital Signs BP 131/81 (BP Location: Right Arm)   Pulse 100   Temp (!) 100.7 F (38.2 C) (Oral)   Resp 20   Ht 5\' 8"  (1.727 m)   Wt 120.2 kg   SpO2 100%   BMI 40.29 kg/m   Physical Exam  Constitutional: He is oriented to  person, place, and time. He appears well-developed and well-nourished. No distress.  HENT:  Head: Normocephalic and atraumatic.  OP with erythema, no exudates. + nasal congestion with mucosal edema.   Neck: Normal range of motion. Neck supple.  No meningeal signs.   Cardiovascular: Normal rate, regular rhythm and normal heart sounds.   Pulmonary/Chest: Effort normal.  Lungs are clear to auscultation bilaterally - no w/r/r  Abdominal: Soft. He exhibits no distension. There is no tenderness.  Musculoskeletal: Normal range of motion.  Neurological:  He is alert and oriented to person, place, and time.  Skin: Skin is warm and dry. He is not diaphoretic.  Nursing note and vitals reviewed.    ED Treatments / Results  Labs (all labs ordered are listed, but only abnormal results are displayed) Labs Reviewed  RAPID STREP SCREEN (NOT AT [facility])  CULTURE, GROUP A STREP [facility])    EKG  EKG Interpretation None       Radiology Dg Chest 2 View  Result Date: 04/02/2016 CLINICAL DATA:  Cough, congestion and fever. EXAM: CHEST  2 VIEW COMPARISON:  12/02/2015 FINDINGS: Cardiomediastinal silhouette is normal. Mediastinal contours appear intact. There is no evidence of focal airspace consolidation, pleural effusion or pneumothorax. Mild peribronchial thickening with lower lobe predominance. Osseous structures are without acute abnormality. Soft tissues are grossly normal. IMPRESSION: Mild peribronchial thickening with lower lobe predominance which may be seen with acute bronchitis. Electronically Signed   By: Fidela Salisbury M.D.   On: 04/02/2016 16:12    Procedures Procedures (including critical care time)  Medications Ordered in ED Medications  ibuprofen (ADVIL,MOTRIN) tablet 800 mg (800 mg Oral Given 04/02/16 1442)     Initial Impression / Assessment and Plan / ED Course  I have reviewed the triage vital signs and the nursing notes.  Pertinent labs & imaging results that were available during my care of the patient were reviewed by me and considered in my medical decision making (see chart for details).    Derrick Scott is a 41 y.o. male who presents to ED for acute onset of generalized body aches, sore throat, congestion since yesterday. Patient febrile 100.6 in ED - ibuprofen given. On exam, OP with erythema but no exudates - rapid strep negative. Lungs CTA bilaterally and CXR negative. Likely viral illness. Symptomatic home care instructions discussed including alternating between tylenol and ibuprofen to control fever.  Increase hydration. PCP follow up if symptoms persist. Return precautions discussed and all questions answered.    Final Clinical Impressions(s) / ED Diagnoses   Final diagnoses:  Cough  Viral illness    New Prescriptions New Prescriptions   IBUPROFEN (ADVIL,MOTRIN) 800 MG TABLET    Take 1 tablet (800 mg total) by mouth every 8 (eight) hours as needed (fever, body aches).     [facility] Ward, PA-C 04/02/16 [place], MD 04/04/16 [phone]

## 2016-04-03 LAB — CULTURE, GROUP A STREP (THRC)

## 2016-04-08 ENCOUNTER — Emergency Department (HOSPITAL_BASED_OUTPATIENT_CLINIC_OR_DEPARTMENT_OTHER): Payer: BC Managed Care – PPO

## 2016-04-08 ENCOUNTER — Emergency Department (HOSPITAL_BASED_OUTPATIENT_CLINIC_OR_DEPARTMENT_OTHER)
Admission: EM | Admit: 2016-04-08 | Discharge: 2016-04-08 | Disposition: A | Discharge status: Home / Self Care | Payer: BC Managed Care – PPO | Attending: Emergency Medicine | Admitting: Emergency Medicine

## 2016-04-08 ENCOUNTER — Encounter (HOSPITAL_BASED_OUTPATIENT_CLINIC_OR_DEPARTMENT_OTHER): Payer: Self-pay | Admitting: *Deleted

## 2016-04-08 DIAGNOSIS — J029 Acute pharyngitis, unspecified: Secondary | ICD-10-CM

## 2016-04-08 LAB — RAPID STREP SCREEN (MED CTR MEBANE ONLY): STREPTOCOCCUS, GROUP A SCREEN (DIRECT): NEGATIVE

## 2016-04-08 MED ORDER — LIDOCAINE VISCOUS 2 % MT SOLN: 20.0000 mL | OROMUCOSAL | 0 refills | Status: DC | PRN

## 2016-04-08 NOTE — ED Provider Notes (Signed)
[facility] DEPT [facility] Provider Note   CSN: [id number] Arrival date & time: 04/08/16  1321     History   Chief Complaint Chief Complaint  Patient presents with  . Sore Throat    HPI Derrick Scott is a 41 y.o. male recently seen for URI here on 3/17 presents today with complaints of sore throat for one week. He reports his symptoms have been worsening, constant, sharp 4/10 sore throat pain.  He describes associated productive cough, nasal congestion, fevers, chills. He reports trying Alka-Seltzer cold, ibuprofen, Tylenol, fluids without relief. He denies any trouble swallowing, trouble breathing, changes in bowel movements, changes in urinary symptoms, abdominal pain, nausea, vomiting.   The history is provided by the patient. No language interpreter was used.  Sore Throat  Pertinent negatives include no shortness of breath.    Past Medical History:  Diagnosis Date  . Medial meniscus tear 01/2012   right knee  . Pneumonia   . Renal disorder   . Vertigo, benign paroxysmal 2015    Patient Active Problem List   Diagnosis Date Noted  . Left knee pain 03/25/2016  . Cough 12/04/2015  . Strain of lumbar paraspinal muscle 10/07/2015  . Laryngopharyngeal reflux (LPR) 07/24/2015  . Headache 01/29/2015  . Post corneal transplant 07/25/2014  . 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 BPPV (benign paroxysmal positional vertigo) 08/30/2013  . Adrenal mass, right ([place]) 05/22/2013  . Flank pain 04/23/2013  . Elbow pain, left 02/22/2013  . Knee pain, right 02/15/2012  . OBSTRUCTIVE SLEEP APNEA 07/31/2009  . OBESITY, NOS 03/16/2006  . ANKLE, PAINFUL ARTHRALGIA 03/16/2006    Past Surgical History:  Procedure Laterality Date  . CORNEAL TRANSPLANT  12/2009  . KNEE ARTHROSCOPY  02/20/2012   Procedure: ARTHROSCOPY KNEE;  Surgeon: Alta Corning, MD;  Location: [facility];  Service: Orthopedics;  Laterality: Right;  Chondroplasia patella femoral joint,  medial meniscal debriedment       Home Medications    Prior to Admission medications   Medication Sig Start Date End Date Taking? Authorizing Provider  ibuprofen (ADVIL,MOTRIN) 800 MG tablet Take 1 tablet (800 mg total) by mouth every 8 (eight) hours as needed (fever, body aches). 04/02/16   Ozella Almond Ward, PA-C  lidocaine (XYLOCAINE) 2 % solution Use as directed 20 mLs in the mouth or throat as needed for mouth pain. 04/08/16   Ezme Duch Mathews [place], [place]    Family History Family History  Problem Relation Age of Onset  . Asthma Mother   . Diabetes Father   . Cancer Father     prostate  . Osteoarthritis Sister   . Diabetes Sister     Social History Social History  Substance Use Topics  . Smoking status: Never Smoker  . Smokeless tobacco: Never Used  . Alcohol use No     Allergies   Patient has no known allergies.   Review of Systems Review of Systems  Constitutional: Positive for chills and fever.  HENT: Positive for congestion and sore throat.   Respiratory: Positive for cough. Negative for shortness of breath.      Physical Exam Updated Vital Signs BP 106/73 (BP Location: Right Arm)   Pulse 86   Temp 99.3 F (37.4 C) (Oral)   Resp 18   Ht 5\' 8"  (1.727 m)   Wt 120.2 kg   SpO2 100%   BMI 40.29 kg/m   Physical Exam  Constitutional: He is oriented to person, place, and time.  He appears well-developed and well-nourished.  Well appearing  HENT:  Head: Normocephalic and atraumatic.  Right Ear: External ear normal.  Left Ear: External ear normal.  Nose: Nose normal.  Mouth/Throat: Oropharynx is clear and moist. No oropharyngeal exudate.  Oropharynx without evidence of redness or exudates. Tonsils without evidence of redness or exudates. Tonsills do appear to have swelling.  TM's appear normal with no evidence of bulging. EAC appear non erythematous and not swollen  Eyes: EOM are normal. Pupils are equal, round, and reactive to light.  Neck: Normal  range of motion.  Normal ROM. No nuchal rigidity.   Cardiovascular: Normal rate and normal heart sounds.   Pulmonary/Chest: Effort normal and breath sounds normal. No respiratory distress. He has no wheezes. He has no rales.  Lungs CTA. No wheezing. No rales. No stridor. Normal work of breathing  Abdominal: Soft. There is no tenderness. There is no rebound and no guarding.  Soft and nontender. No rebound. No guarding. Negative murphy's sign. No focal tenderness at McBurney's point. No CVA tenderness. No evidence of hernia  Neurological: He is alert and oriented to person, place, and time.  Skin: Skin is warm.  Psychiatric: He has a normal mood and affect. His behavior is normal.  Nursing note and vitals reviewed.    ED Treatments / Results  Labs (all labs ordered are listed, but only abnormal results are displayed) Labs Reviewed  RAPID STREP SCREEN (NOT AT [facility])  CULTURE, GROUP A STREP [facility])    EKG  EKG Interpretation None       Radiology Dg Chest 2 View  Result Date: 04/08/2016 CLINICAL DATA:  One week history of cough and congestion EXAM: CHEST  2 VIEW COMPARISON:  April 02, 2016 FINDINGS: Lungs are clear. Heart size and pulmonary vascularity are within normal limits. No adenopathy. No bone lesions. IMPRESSION: No edema or consolidation. Electronically Signed   By: Lowella Grip III M.D.   On: 04/08/2016 15:09    Procedures Procedures (including critical care time)  Medications Ordered in ED Medications - No data to display   Initial Impression / Assessment and Plan / ED Course  I have reviewed the triage vital signs and the nursing notes.  Pertinent labs & imaging results that were available during my care of the patient were reviewed by me and considered in my medical decision making (see chart for details).    Patients symptoms are consistent with pharyngitis vs URI, likely viral etiology. Strep test negative. On exam, pt in NAD. Hemodynamically stable. No  hypoxia. Afebrile. Lungs clear, Heart sounds clear. Normal work of breathing. TMs clear. Throat benign. Abdomen nontender/soft. Pt CXR negative for acute infiltrate. Pt is tolerating secretions. Presentation not concerning for peritonsillar abscess or spread of infection to deep spaces of the throat; patent airway. Discussed that antibiotics are not indicated for viral infections. Pt will be discharged with symptomatic treatment. Recommended PCP follow up. Verbalizes understanding and is agreeable with plan. Pt is hemodynamically stable & in NAD prior to dc.  Final Clinical Impressions(s) / ED Diagnoses   Final diagnoses:  Pharyngitis, unspecified etiology    New Prescriptions Discharge Medication List as of 04/08/2016  3:33 PM    START taking these medications   Details  lidocaine (XYLOCAINE) 2 % solution Use as directed 20 mLs in the mouth or throat as needed for mouth pain., Starting Fri 04/08/2016, [place], [place] 04/08/16 [place]  Long, MD 04/08/16 2002

## 2016-04-08 NOTE — Discharge Instructions (Signed)
Please follow-up with your primary care provider within one week regarding today's visit. Please use warm saltwater rinses to ease throat. You can also take plenty to help relieve throat. Please take viscous lidocaine  Get help right away if: Your neck becomes stiff. You drool or are unable to swallow liquids. You vomit or are unable to keep medicines or liquids down. You have severe pain that does not go away with the use of recommended medicines. You have trouble breathing (not caused by a stuffy nose).

## 2016-04-08 NOTE — ED Triage Notes (Signed)
Sore throat for a week.

## 2016-04-11 LAB — CULTURE, GROUP A STREP (THRC)

## 2016-05-18 ENCOUNTER — Ambulatory Visit (INDEPENDENT_AMBULATORY_CARE_PROVIDER_SITE_OTHER): Payer: BC Managed Care – PPO | Admitting: Family Medicine

## 2016-05-18 ENCOUNTER — Telehealth: Payer: Self-pay | Admitting: Family Medicine

## 2016-05-18 ENCOUNTER — Encounter: Payer: Self-pay | Admitting: Family Medicine

## 2016-05-18 VITALS — BP 110/82 | HR 92 | Temp 98.3°F | Ht 68.0 in | Wt 263.0 lb

## 2016-05-18 DIAGNOSIS — G4730 Sleep apnea, unspecified: Secondary | ICD-10-CM

## 2016-05-18 DIAGNOSIS — Z6839 Body mass index (BMI) 39.0-39.9, adult: Secondary | ICD-10-CM

## 2016-05-18 DIAGNOSIS — Z0000 Encounter for general adult medical examination without abnormal findings: Secondary | ICD-10-CM

## 2016-05-18 DIAGNOSIS — E669 Obesity, unspecified: Secondary | ICD-10-CM | POA: Diagnosis not present

## 2016-05-18 MED ORDER — MELOXICAM 15 MG PO TABS: ORAL_TABLET | ORAL | 3 refills | Status: DC

## 2016-05-18 NOTE — Patient Instructions (Signed)
Great to see you Try the meloxicam ONCE a day instead of the ibuprofen Do the stretches we talked about I will send you a note about your blood work  Sometime in the next year I want to repeat your sleep study Why don't you come see me in the fall and we will discuss

## 2016-05-18 NOTE — Telephone Encounter (Signed)
Pt needs a note for work today. ep

## 2016-05-18 NOTE — Telephone Encounter (Signed)
Letter printed and given to pt. Ottis Stain, CMA

## 2016-05-19 ENCOUNTER — Encounter: Payer: Self-pay | Admitting: Family Medicine

## 2016-05-19 DIAGNOSIS — Z0000 Encounter for general adult medical examination without abnormal findings: Secondary | ICD-10-CM | POA: Insufficient documentation

## 2016-05-19 LAB — COMPREHENSIVE METABOLIC PANEL
ALK PHOS: 72 IU/L (ref 39–117)
ALT: 29 IU/L (ref 0–44)
AST: 29 IU/L (ref 0–40)
Albumin/Globulin Ratio: 1.2 (ref 1.2–2.2)
Albumin: 4.2 g/dL (ref 3.5–5.5)
BUN/Creatinine Ratio: 15 (ref 9–20)
BUN: 19 mg/dL (ref 6–24)
Bilirubin Total: 0.3 mg/dL (ref 0.0–1.2)
CALCIUM: 9 mg/dL (ref 8.7–10.2)
CO2: 17 mmol/L — AB (ref 18–29)
CREATININE: 1.28 mg/dL — AB (ref 0.76–1.27)
Chloride: 103 mmol/L (ref 96–106)
GFR calc Af Amer: 79 mL/min/{1.73_m2} (ref >59)
GFR, EST NON AFRICAN AMERICAN: 69 mL/min/{1.73_m2} (ref >59)
GLOBULIN, TOTAL: 3.5 g/dL (ref 1.5–4.5)
GLUCOSE: 92 mg/dL (ref 65–99)
Potassium: 4.2 mmol/L (ref 3.5–5.2)
Sodium: 137 mmol/L (ref 134–144)
Total Protein: 7.7 g/dL (ref 6.0–8.5)

## 2016-05-19 LAB — LIPID PANEL
CHOLESTEROL TOTAL: 182 mg/dL (ref 100–199)
Chol/HDL Ratio: 3.9 ratio (ref 0.0–5.0)
HDL: 47 mg/dL (ref >39)
LDL Calculated: 119 mg/dL — ABNORMAL HIGH (ref 0–99)
TRIGLYCERIDES: 82 mg/dL (ref 0–149)
VLDL Cholesterol Cal: 16 mg/dL (ref 5–40)

## 2016-05-19 LAB — HIV ANTIBODY (ROUTINE TESTING W REFLEX): HIV Screen 4th Generation wRfx: NONREACTIVE

## 2016-05-19 NOTE — Progress Notes (Signed)
    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 Some sore neck muscles occasionally after weed eating or edging a lot at work  REVIEW OF SYSTEMS:  Review of Systems  Constitutional: Negative for activity chang; no  appetite change and no unexpected weight change.  Eyes: Negative for eye pain and no visual disturbance.  Neck: denies neck pain; no swallowing problems CV: No chest pain, no shortness of breath, no lower extremity edema. No change in exercise tolerance Respiratory: Negative for cough or wheezing.  No shortness of breath. Gastrointestinal: Negative for abdominal pain, no diarrhea and no  constipation.  Genitourinary: Negative for decreased urine volume and  no difficulty urinating.  Musculoskeletal: Negative for arthralgias. No muscle weakness. Skin: Negative for rash.  Psychiatric/Behavioral: Negative for behavioral problems; no sleep disturbance and no  agitation.     OBJECTIVE:  Vital signs reviewed GENERALl: Well developed, well nourished, in no acute distress. HEENT: PERRLA, EOMI, sclerae are nonicteric NECK: Supple, FROM, without lymphadenopathy.  THYROID: normal without nodularity CAROTID ARTERIES: without bruits LUNGS: clear to auscultation bilaterally. No wheezes or rales. Normal respiratory effort HEART: Regular rate and rhythm, no murmurs. Distal pulses are bilaterally symmetrical, 2+. ABDOMEN: soft with positive bowel sounds. No masses noted MSK: MOE x 4. Normal muscle strength, bulk and tone. SKIN no rash. Normal temperature. NEURO: no focal deficits. Normal gait. Normal balance.   ASSESSMENT / PLAN: Please see problem oriented charting for details

## 2016-05-19 NOTE — Assessment & Plan Note (Signed)
Labs Discussed diet and exercise Discussed health nmainteneance

## 2016-05-19 NOTE — Assessment & Plan Note (Signed)
>>  ASSESSMENT AND PLAN FOR SLEEP APNEA WRITTEN ON 05/19/2016  2:04 PM BY Jsiah Menta L, MD  hxof mild OSA--2011. I would like to repeat sleep study--he is actively trying to lose weight --we discussed and he will f/u in fall and we will get sleep study then. Discussed importance of OSA treatment (if he still has it)

## 2016-05-19 NOTE — Assessment & Plan Note (Signed)
hxof mild OSA--2011. I would like to repeat sleep study--he is actively trying to lose weight --we discussed and he will f/u in fall and we will get sleep study then. Discussed importance of OSA treatment (if he still has it)

## 2016-06-07 ENCOUNTER — Ambulatory Visit: Payer: BC Managed Care – PPO | Admitting: Family Medicine

## 2016-06-17 ENCOUNTER — Ambulatory Visit (INDEPENDENT_AMBULATORY_CARE_PROVIDER_SITE_OTHER): Payer: BC Managed Care – PPO | Admitting: Family Medicine

## 2016-06-17 ENCOUNTER — Encounter: Payer: Self-pay | Admitting: Family Medicine

## 2016-06-17 ENCOUNTER — Ambulatory Visit: Payer: Self-pay

## 2016-06-17 VITALS — BP 128/70 | Ht 68.0 in | Wt 255.0 lb

## 2016-06-17 DIAGNOSIS — M25572 Pain in left ankle and joints of left foot: Secondary | ICD-10-CM | POA: Diagnosis not present

## 2016-06-17 MED ORDER — COLCHICINE 0.6 MG PO TABS: 0.6000 mg | ORAL_TABLET | Freq: Every day | ORAL | 0 refills | Status: DC

## 2016-06-17 NOTE — Assessment & Plan Note (Signed)
His pain seems to be fairly atraumatic in origin. Would consider gout being the source as severe as his symptoms are. Ultrasound was not revealing for a tendon problem but there is fluid within the joint. - Colchicine sent - Advised to follow-up in 10 days. If no improvement may need to consider prednisone. - Could consider an MRI if pain does not improve whatsoever.

## 2016-06-17 NOTE — Patient Instructions (Addendum)
Please take two tablets (1.2 mg) ORALLY at the first sign of a flare followed by 0.6 mg one hr later.   Please take one tablet daily until 2 days after your symptoms stop.   Please follow up in one week.

## 2016-06-17 NOTE — Progress Notes (Signed)
  Derrick Scott - 41 y.o. male MRN [id number]  Date of birth: 1975-09-30  SUBJECTIVE:  Including CC & ROS.   Derrick Scott is a 41 year old male is presenting with left ankle pain. He reports that he has sprained his ankle different occasions. This pain is Significantly Worse over the Course of the past Few Days. He Reports That Yesterday He Is Almost Unable to Walk and Had a Take Breaks. The Pain Is Occurring on the Dorsal Aspect of His Foot. He Has Tried Ibuprofen and Ice. He denies any rash or trauma to his foot. He does have some lateral numbness and the midfoot region. He wears steel toe boots for work.  ROS: No unexpected weight loss, fever, chills, swelling, instability, muscle pain, redness, otherwise see HPI   HISTORY: Past Medical, Surgical, Social, and Family History Reviewed & Updated per EMR.   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  Psych:  alert and oriented MSK:  Left Ankle:  No overlying erythema or swelling. Sensation is seems to be intact. Normal ankle range of motion. Significant pain when ankles placed in dorsal flexion. Normal strength to resistance. Negative anterior drawer. Negative talar tilt. Neurovascular intact.  Limited ultrasound: Left ankle:  Posterior tibialis tendon did show some fluid around the tendon next to the medial malleolus. Peroneal tendons were normal in appearance  There appears to be some  hypoechoic changes to suggest fluid within the ankle joint itself. Osteophyte observed on the talus  Summary: Changes consistent with fluid within the ankle joint to suggest possible gouty flare  Ultrasound and interpretation by Clearance Coots, MD   ASSESSMENT & PLAN:   Acute left ankle pain His pain seems to be fairly atraumatic in origin. Would consider gout being the source as severe as his symptoms are. Ultrasound was not revealing for a tendon problem but there is fluid within the joint. - Colchicine sent - Advised to follow-up in 10 days. If no improvement may need to consider prednisone. - Could consider an MRI if pain does not improve whatsoever.

## 2016-06-24 ENCOUNTER — Ambulatory Visit (INDEPENDENT_AMBULATORY_CARE_PROVIDER_SITE_OTHER): Payer: BC Managed Care – PPO | Admitting: Student

## 2016-06-24 ENCOUNTER — Encounter: Payer: Self-pay | Admitting: Student

## 2016-06-24 VITALS — BP 132/88 | Ht 68.0 in | Wt 255.0 lb

## 2016-06-24 DIAGNOSIS — M25372 Other instability, left ankle: Secondary | ICD-10-CM

## 2016-06-24 DIAGNOSIS — M25572 Pain in left ankle and joints of left foot: Secondary | ICD-10-CM | POA: Diagnosis not present

## 2016-06-24 MED ORDER — PREDNISONE 10 MG PO TABS: ORAL_TABLET | ORAL | 0 refills | Status: DC

## 2016-06-24 NOTE — Assessment & Plan Note (Addendum)
We'll get an MRI to assess the joint, tendons, ligaments to further assess his pain. We'll call him with results. Started a home exercise program for him. Consider physical therapy at least for 1 visit in the future to go over ankle strengthening and proprioception exercises. Colchicine did not help patient, so do not believe this to be gout. Did place him on prednisone to help with the pain while he is working. He also seems to have degenerative changes in his ankle seen on x-ray. MRI will further assess.

## 2016-06-24 NOTE — Progress Notes (Signed)
  Derrick Scott - 41 y.o. male MRN [id number]  Date of birth: 1975-09-11  SUBJECTIVE:  Including CC & ROS.  CC: left ankle pain  Presents with left ankle pain that has been ongoing since February 2018.  He reports that he had sprained his ankle at that time and it has not felt the same since. It hurts on the anterior aspect medial and lateral aspect of his ankle. He sometimes will burning numbness and tingling into his lateral foot.  He denies any redness or warmth to the ankle. He was seen last week in the office and ultrasound showed an effusion in his ankle joint.  He was treated as gout and placed on colchicine. He notes that this has not helped much. 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   Location:  Duration:  Quality:  Severity:  Timing:  Context:  Modifying factors:  Associated signs and symptoms:   ROS: No unexpected weight loss, fever, chills, swelling, instability, muscle pain, numbness/tingling, redness, otherwise see HPI   PMHx - Updated and reviewed.  Contributory factors include: Obesity, OSA PSHx - Updated and reviewed.  Contributory factors include:  Negative FHx - Updated and reviewed.  Contributory factors include:  Negative Social Hx - Updated and reviewed. Contributory factors include: Negative Medications - reviewed   DATA REVIEWED: 3 view left ankle x-rays show chronic degenerative changes  PHYSICAL EXAM:  VS: BP:132/88  HR: bpm  TEMP: ( )  RESP:   HT:5\' 8"  (172.7 cm)   WT:255 lb (115.7 kg)  BMI:38.9 PHYSICAL EXAM: Gen: NAD, alert, cooperative with exam, well-appearing HEENT: clear conjunctiva,  CV:  no edema, capillary refill brisk, normal rate Resp: non-labored Skin: no rashes, normal turgor  Neuro: no gross deficits.  Psych:  alert and oriented  Ankle & Foot: No visible swelling, ecchymosis, erythema, ulcers, calluses, blister Tenderness to palpation on  lateral, anterior, medial aspect of ankle in soft tissues on left No tenderness on lateral and medial malleolus No sign of peroneal tendon subluxations  Full in plantarflexion, dorsiflexion, inversion, and eversion of the foot; flexion and extension of the toes Strength: 5/5 in all directions. Sensation: intact Vascular: intact w/ dorsalis pedis & posterior tibialis pulses 2+ Negative Anterior drawer test Talar tilt with laxity on the left, none on the right    ASSESSMENT & PLAN:   Left ankle instability We'll get an MRI to assess the joint, tendons, ligaments to further assess his pain. We'll call him with results. Started a home exercise program for him. Consider physical therapy at least for 1 visit in the future to go over ankle strengthening and proprioception exercises. Colchicine did not help patient, so do not believe this to be gout. Did place him on prednisone to help with the pain while he is working.

## 2016-06-29 ENCOUNTER — Encounter: Payer: Self-pay | Admitting: Family Medicine

## 2016-06-29 ENCOUNTER — Ambulatory Visit (INDEPENDENT_AMBULATORY_CARE_PROVIDER_SITE_OTHER): Payer: BC Managed Care – PPO | Admitting: Family Medicine

## 2016-06-29 ENCOUNTER — Ambulatory Visit (HOSPITAL_COMMUNITY)
Admission: RE | Admit: 2016-06-29 | Discharge: 2016-06-29 | Disposition: A | Discharge status: Home / Self Care | Payer: BC Managed Care – PPO | Source: Ambulatory Visit | Attending: Family Medicine | Admitting: Family Medicine

## 2016-06-29 VITALS — BP 126/88 | HR 91 | Temp 98.2°F | Ht 68.0 in | Wt 263.8 lb

## 2016-06-29 DIAGNOSIS — E278 Other specified disorders of adrenal gland: Secondary | ICD-10-CM

## 2016-06-29 DIAGNOSIS — M25579 Pain in unspecified ankle and joints of unspecified foot: Secondary | ICD-10-CM

## 2016-06-29 DIAGNOSIS — M25372 Other instability, left ankle: Secondary | ICD-10-CM

## 2016-06-29 DIAGNOSIS — E279 Disorder of adrenal gland, unspecified: Secondary | ICD-10-CM

## 2016-06-29 NOTE — Assessment & Plan Note (Signed)
>>  ASSESSMENT AND PLAN FOR ADRENAL MASS, RIGHT (HCC) WRITTEN ON 06/29/2016  4:08 PM BY Rector Devonshire L, MD  In reviewing his chart, I noticed adrenal mass on his problem list. Looks like it was an incidental finding on CT of abdomen 2015. CT study of 2016 noted to be stable and unchanged. No further workup was recommended.

## 2016-06-29 NOTE — Assessment & Plan Note (Signed)
We'll go ahead and get x-rays of both ankles for comparison. We may have to move the MRI appointment up. I will write him out for work while we get this evaluated. If MRI is abnormal, he may need arthroscopic evaluation of the joint.

## 2016-06-29 NOTE — Progress Notes (Signed)
    CHIEF COMPLAINT / HPI:  Bilateral but left greater than right ankle pain. Getting much worse over the last 2-3 days. I have seen him a week or so ago and we had considered options and decide to order MRI. MRI appointment is not until the 19th. He's having some much pain right now that's difficult for him to walk or stand more than a few minutes. He thinks the right ankle is bothering him because she's trying to offload the left ankle. It feels like it's more swollen than usual. He's not noticed any unusual warmth or erythema. He's not had any numbness or tingling in his left foot.  REVIEW OF SYSTEMS:  He denies fever. See history of present illness above for additional pertinent review of systems  OBJECTIVE:  Vital signs are reviewed.   GEN.: Well-developed male no acute distress Ankle: Right. Normal anterior drawer that is painless. Inversion eversion normal range of motion and painless. Dorsiflexion plantar flexion 5+ painless. LEFT ankle: Anterior drawer about 1 cm more lax than the right ankle. Anterior drawer is also painful as is resisted inversion. Anus deep within the ankle. Subtalar motion seems normal. Dorsiflexion plantar flexion strength 5 out of 5 SKIN: Either ankle has any erythema or unusual warmth. There are no lesions noted on skin NEURO: Intact sensation to soft touch bilateral feet and ankles. VASCULAR: Dorsalis pedis and posterior tibial pulses 2+ bilaterally equal  ASSESSMENT / PLAN: Please see problem oriented charting for details

## 2016-06-29 NOTE — Assessment & Plan Note (Signed)
In reviewing his chart, I noticed adrenal mass on his problem list. Looks like it was an incidental finding on CT of abdomen 2015. CT study of 2016 noted to be stable and unchanged. No further workup was recommended.

## 2016-06-29 NOTE — Patient Instructions (Signed)
Get your ankle X rays and then I will have my nirse call you in next day or so. We will start your FMLA tomorrow. I will let you know our plan after I see the X rays

## 2016-07-05 ENCOUNTER — Ambulatory Visit
Admission: RE | Admit: 2016-07-05 | Discharge: 2016-07-05 | Disposition: A | Discharge status: Home / Self Care | Payer: BC Managed Care – PPO | Source: Ambulatory Visit | Attending: Student | Admitting: Student

## 2016-07-05 DIAGNOSIS — M25572 Pain in left ankle and joints of left foot: Secondary | ICD-10-CM

## 2016-07-06 ENCOUNTER — Telehealth: Payer: Self-pay | Admitting: Student

## 2016-07-06 NOTE — Telephone Encounter (Signed)
Called pt and verified name and DOB.  Gave MRI results- posterolateral impingement of ankle.  Recommend follow up on Friday, consider injection and likely green inserts vs orthotics.    Signed,  Balinda Quails, DO [facility] 1:37 PM 07/06/16

## 2016-07-08 ENCOUNTER — Ambulatory Visit (INDEPENDENT_AMBULATORY_CARE_PROVIDER_SITE_OTHER): Payer: BC Managed Care – PPO | Admitting: Student

## 2016-07-08 VITALS — BP 118/80

## 2016-07-08 DIAGNOSIS — M2142 Flat foot [pes planus] (acquired), left foot: Secondary | ICD-10-CM

## 2016-07-08 DIAGNOSIS — M2141 Flat foot [pes planus] (acquired), right foot: Secondary | ICD-10-CM

## 2016-07-08 DIAGNOSIS — M25872 Other specified joint disorders, left ankle and foot: Secondary | ICD-10-CM

## 2016-07-08 MED ORDER — METHYLPREDNISOLONE ACETATE 40 MG/ML IJ SUSP
40.0000 mg | Freq: Once | INTRAMUSCULAR | Status: AC
  Administered 2016-07-08: 40 mg via INTRA_ARTICULAR

## 2016-07-08 NOTE — Progress Notes (Signed)
  Derrick Scott - 41 y.o. male MRN [id number]  Date of birth: Jun 06, 1975  SUBJECTIVE:  Including CC & ROS.  CC: left ankle pain  Presents in follow-up for left ankle pain. He has been having this sense February. He has been treated for a gout in which this is not improved. He recently had an MRI the has it was not improving with medications and diagnosed with posterior lateral impingement. This is likely because of his pes planus and overpronation. He had an ankle sprain that will likely triggered this pain cycle. Reports improvement in pain and swelling but is still bothering him when he works.    ROS: No unexpected weight loss, fever, chills, swelling, instability, muscle pain, numbness/tingling, redness, otherwise see HPI   PMHx - Updated and reviewed.  Contributory factors include: Negative PSHx - Updated and reviewed.  Contributory factors include:  Negative FHx - Updated and reviewed.  Contributory factors include:  Negative Social Hx - Updated and reviewed. Contributory factors include: works at [employer] Medications - reviewed   DATA REVIEWED: MRI left ankle 07/05/16: posterolateral impingement  PHYSICAL EXAM:  VS: BP:118/80  HR: bpm  TEMP: ( )  RESP:   HT:    WT:   BMI:  PHYSICAL EXAM: Gen: NAD, alert, cooperative with exam, well-appearing HEENT: clear conjunctiva,  CV:  no edema, capillary refill brisk, normal rate Resp: non-labored Skin: no rashes, normal turgor  Neuro: no gross deficits.  Psych:  alert and oriented  Ankle & Foot: No visible swelling, ecchymosis, erythema, ulcers, calluses, blister Arch: pes planus  No tenderness on lateral and medial malleolus No sign of peroneal tendon subluxations or tenderness to palpation Full in plantarflexion, dorsiflexion, inversion, and eversion of the foot; flexion and extension of the toes Strength: 5/5 in all directions. Sensation: intact Vascular: intact w/ dorsalis pedis & posterior tibialis pulses  2+   ASSESSMENT & PLAN:   Ankle impingement syndrome, left Likely getting impingement from his pes planus. For mechanical stabilization, he was fitted for an orthotic. He reported improvement in pain and support. His left ankle was also injected to help with the synovitis and the pain cycle.  He is out of work for another week. He'll follow-up as needed for this.  Patient was fitted for a standard, cushioned, semi-rigid orthotic. The orthotic was heated and afterward the patient stood on the orthotic blank positioned on the orthotic stand. The patient was positioned in subtalar neutral position and 10 degrees of ankle dorsiflexion in a weight bearing stance. After completion of molding, a stable base was applied to the orthotic blank. The blank was ground to a stable position for weight bearing. Size: 12 Base: Blue EVA Additional Posting and Padding: None The patient ambulated these, and they were very comfortable.  I spent 40 minutes with this patient, greater than 50% was face-to-face time counseling regarding the below diagnosis.

## 2016-07-08 NOTE — Assessment & Plan Note (Addendum)
Likely getting impingement from his pes planus. For mechanical stabilization, he was fitted for an orthotic. He reported improvement in pain and support. His left ankle was also injected to help with the synovitis and the pain cycle.  He is out of work for another week. He'll follow-up as needed for this.

## 2016-07-13 ENCOUNTER — Encounter: Payer: Self-pay | Admitting: Family Medicine

## 2016-07-29 ENCOUNTER — Telehealth: Payer: Self-pay | Admitting: Family Medicine

## 2016-07-29 ENCOUNTER — Encounter: Payer: Self-pay | Admitting: Family Medicine

## 2016-07-29 ENCOUNTER — Ambulatory Visit: Payer: BC Managed Care – PPO | Admitting: Family Medicine

## 2016-07-29 NOTE — Telephone Encounter (Signed)
Pt has FMLA and needs a return to work note. Pt tried to return to work on the 10th, but was unable to go back without a note. Please call pt when note is ready for pick up. ep

## 2016-07-29 NOTE — Telephone Encounter (Addendum)
I have placed a note up front for him.  I put return date as immediate THANKS! [name]

## 2016-07-29 NOTE — Telephone Encounter (Signed)
Patient informed. Derrick Scott T [name], CMA  

## 2016-09-08 ENCOUNTER — Other Ambulatory Visit: Payer: Self-pay | Admitting: Occupational Medicine

## 2016-09-08 ENCOUNTER — Ambulatory Visit: Payer: Self-pay

## 2016-09-08 DIAGNOSIS — M545 Low back pain: Secondary | ICD-10-CM

## 2016-10-04 ENCOUNTER — Encounter: Payer: Self-pay | Admitting: Internal Medicine

## 2016-10-04 ENCOUNTER — Ambulatory Visit (INDEPENDENT_AMBULATORY_CARE_PROVIDER_SITE_OTHER): Payer: BC Managed Care – PPO | Admitting: Internal Medicine

## 2016-10-04 VITALS — BP 110/68 | HR 108 | Temp 98.8°F | Ht 68.0 in | Wt 276.4 lb

## 2016-10-04 DIAGNOSIS — H109 Unspecified conjunctivitis: Secondary | ICD-10-CM | POA: Diagnosis not present

## 2016-10-04 MED ORDER — ERYTHROMYCIN 5 MG/GM OP OINT: 1.0000 "application " | TOPICAL_OINTMENT | Freq: Four times a day (QID) | OPHTHALMIC | 0 refills | Status: DC

## 2016-10-04 NOTE — Progress Notes (Signed)
   Subjective:    Derrick Scott - 41 y.o. male MRN [id number]  Date of birth: [birth year]  HPI  Derrick Scott is here for eye redness.  Eye Redness: Right sided. Present for 24 hours. Has been having copious amounts of watery-yellow discharge. Has had some eyelid swelling. No vision changes or pain. Nephew and niece both diagnosed with pink eye this week and being treated with antibiotic ointment. Patient has been around his family members throughout the week. Does not wear contact lenses. No trauma to eye.   -  reports that he has never smoked. He has never used smokeless tobacco. - Review of Systems: Per HPI. - Past Medical History: Patient Active Problem List   Diagnosis Date Noted  . Ankle impingement syndrome, left 07/08/2016  . Well adult exam 05/19/2016  . Laryngopharyngeal reflux (LPR) 07/24/2015  . Post corneal transplant 07/25/2014  . 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 Adrenal mass, right ([place]) 05/22/2013  . Sleep apnea 07/31/2009  . OBESITY, NOS 03/16/2006   - Medications: reviewed and updated   Objective:   Physical Exam BP 110/68   Pulse (!) 108   Temp 98.8 F (37.1 C) (Oral)   Ht 5\' 8"  (1.727 m)   Wt 276 lb 6.4 oz (125.4 kg)   SpO2 94%   BMI 42.03 kg/m  Gen: NAD, alert, cooperative with exam, well-appearing HEENT: NCAT, PERRL, right scleral injection present, yellow thick drainage present in inner corner of right eye, watery discharge from outer corner of right eye, upper eyelid mildly swollen, no pain to palpation, EOMI, visual acuity intact     Assessment & Plan:   1. Bacterial conjunctivitis of right eye Exam and history consistent with bacterial conjunctivitis especially given known contacts. Will treat with Erythromycin ointment. Recommended warm compresses. Discussed hygiene to prevent spread. Return precautions including failure to improve, vision changes, pain with ocular movement, worsening of swelling discussed.     Phill Myron, D.O. 10/04/2016, 1:16 PM PGY-3, [place]

## 2016-10-04 NOTE — Patient Instructions (Signed)
I have prescribed an antibiotic ointment. Apply a ribbon of this across the eye four times per day.    Bacterial Conjunctivitis Bacterial conjunctivitis is an infection of your conjunctiva. This is the clear membrane that covers the white part of your eye and the inner surface of your eyelid. This condition can make your eye:  Red or pink.  Itchy.  This condition is caused by bacteria. This condition spreads very easily from person to person (is contagious) and from one eye to the other eye. Follow these instructions at home: Medicines  Take or apply your antibiotic medicine as told by your doctor. Do not stop taking or applying the antibiotic even if you start to feel better.  Take or apply over-the-counter and prescription medicines only as told by your doctor.  Do not touch your eyelid with the eye drop bottle or the ointment tube. Managing discomfort  Wipe any fluid from your eye with a warm, wet washcloth or a cotton ball.  Place a cool, clean washcloth on your eye. Do this for 10-20 minutes, 3-4 times per day. General instructions  Do not wear contact lenses until the irritation is gone. Wear glasses until your doctor says it is okay to wear contacts.  Do not wear eye makeup until your symptoms are gone. Throw away any old makeup.  Change or wash your pillowcase every day.  Do not share towels or washcloths with anyone.  Wash your hands often with soap and water. Use paper towels to dry your hands.  Do not touch or rub your eyes.  Do not drive or use heavy machinery if your vision is blurry. Contact a doctor if:  You have a fever.  Your symptoms do not get better after 10 days. Get help right away if:  You have a fever and your symptoms suddenly get worse.  You have very bad pain when you move your eye.  Your face: ? Hurts. ? Is red. ? Is swollen.  You have sudden loss of vision. This information is not intended to replace advice given to you by your  health care provider. Make sure you discuss any questions you have with your health care provider. Document Released: 10/13/2007 Document Revised: 06/11/2015 Document Reviewed: 10/16/2014 Elsevier Interactive Patient Education  [employer].

## 2016-10-05 ENCOUNTER — Telehealth: Payer: Self-pay | Admitting: Family Medicine

## 2016-10-05 NOTE — Telephone Encounter (Signed)
Patient seen 10/04/16 by Juleen China for pink eye. Patient was prescribed an ointment but feels it is making his condition worse. Would like eye drops sent in. Please call patient once sent.

## 2016-10-06 ENCOUNTER — Ambulatory Visit (INDEPENDENT_AMBULATORY_CARE_PROVIDER_SITE_OTHER): Payer: BC Managed Care – PPO | Admitting: Family Medicine

## 2016-10-06 DIAGNOSIS — H578 Other specified disorders of eye and adnexa: Secondary | ICD-10-CM | POA: Diagnosis not present

## 2016-10-06 DIAGNOSIS — H5789 Other specified disorders of eye and adnexa: Secondary | ICD-10-CM | POA: Insufficient documentation

## 2016-10-06 MED ORDER — POLYMYXIN B-TRIMETHOPRIM 10000-0.1 UNIT/ML-% OP SOLN: 1.0000 [drp] | Freq: Four times a day (QID) | OPHTHALMIC | 0 refills | Status: DC

## 2016-10-06 NOTE — Telephone Encounter (Signed)
I have prescribed an eye drop to treat pink eye.   Phill Myron, D.O. 10/06/2016, 2:55 PM PGY-3, [place]

## 2016-10-06 NOTE — Patient Instructions (Signed)
Thank you for coming in today, it was so nice to see you! Today we talked about:    GO IMMEDIATELY TO THE EYE DOCTOR AT [place]  If you have any questions or concerns, please do not hesitate to call the office at [phone]. You can also message me directly via MyChart.   Sincerely,  Smitty Cords, MD

## 2016-10-06 NOTE — Telephone Encounter (Signed)
Pt informed. Sharon T Saunders, CMA  

## 2016-10-06 NOTE — Assessment & Plan Note (Addendum)
Patient has diffuse scleral redness associated with pain and intermittent blurry vision. Of additional concern there is a protrusion on the lateral aspect which may be conjunctival edema. Concerned that there is increased intraocular pressure and that this is an ocular emergency. Discussed patient with Dr. Ardelia Mems. Called the on-call ophthalmology physician's office and spoke to nurse Wende Bushy [facility] ophthalmology. Sent a picture to them of patient's eye. The nurse showed the picture of Derrick Scott's eye to Dr. Prudencio Burly who recommended he be seen immediately in his office. Patient given the address to Dr. Prudencio Burly office and he will go immediately.

## 2016-10-06 NOTE — Progress Notes (Signed)
Subjective:    Patient ID: Derrick Scott , male   DOB: January 06, 1976 , 41 y.o..   MRN: [id number]  HPI  Derrick Scott is here for  Chief Complaint  Patient presents with  . Conjunctivitis    1. Right eye redness and swelling: Patient notes that for the last 4 days he has had a red/pink eye with watery discharge. He was seen 2 days ago and diagnosed with pinkeye and given a prescription for erythromycin ointment. He notes that he has been using the erythromycin ointment but that is his eye has only gotten worse. He notes that his  right eye is very painful and it hurts to touch the surrounding area of his face.  admits to some intermittent blurry vision in his right eye.  He notes that he thought that he had pinkeye because his nephew also has the same symptoms. Denies any fevers. Notes that the right side of his face is also swollen. Has a hx of corneal transplant of his left eye.   Review of Systems: Per HPI.   Past Medical History: Patient Active Problem List   Diagnosis Date Noted  . Redness of eye, right 10/06/2016  . Ankle impingement syndrome, left 07/08/2016  . Well adult exam 05/19/2016  . Laryngopharyngeal reflux (LPR) 07/24/2015  . Post corneal transplant 07/25/2014  . 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 Adrenal mass, right ([place]) 05/22/2013  . Sleep apnea 07/31/2009  . OBESITY, NOS 03/16/2006    Medications: reviewed and updated Current Outpatient Prescriptions  Medication Sig Dispense Refill  . colchicine 0.6 MG tablet Take 1 tablet (0.6 mg total) by mouth daily. 30 tablet 0  . erythromycin ophthalmic ointment Place 1 application into the right eye 4 (four) times daily. 3.5 g 0  . meloxicam (MOBIC) 15 MG tablet Take one by mouth daily as needed for muscle and joint pain do not take with OTC ibuprofen 30 tablet 3  . trimethoprim-polymyxin b (POLYTRIM) ophthalmic solution Place 1 drop into the right eye 4 (four) times daily. 10 mL 0   No  current facility-administered medications for this visit.     Social Hx:  reports that he has never smoked. He has never used smokeless tobacco.   Objective:   BP 110/70   Pulse 88   Temp (!) 97.4 F (36.3 C) (Oral)   Ht 5\' 8"  (1.727 m)   Wt 269 lb (122 kg)   SpO2 99%   BMI 40.90 kg/m  Physical Exam  Gen: NAD, alert, cooperative with exam, well-appearing HEENT:     Head: Normocephalic, atraumatic.     Neck: No masses palpated. No goiter. No lymphadenopathy     Eyes: Right eye is diffuse scleral redness with copious watery discharge. There is also an area of protrusion on the conjunctival tissue on the lateral aspect of the right eye. Left eye within normal limits. Pupils equal and reactive to light. Extraocular movements intact however it is painful to move his right eye.    Nose: nasal turbinates moist, no nasal discharge     There is edema and tenderness noted on the right cheek and a palpable lymph node on the preauricular area     Assessment & Plan:  Redness of eye, right Patient has diffuse scleral redness associated with pain and intermittent blurry vision. Of additional concern there is a protrusion on the lateral aspect which may be conjunctival edema. Concerned that there is increased intraocular pressure and that  this is an ocular emergency. Discussed patient with Dr. Ardelia Mems. Called the on-call ophthalmology physician's office and spoke to nurse Wende Bushy [facility] ophthalmology. Sent a picture to them of patient's eye. The nurse showed the picture of Derrick Scott's eye to Dr. Prudencio Burly who recommended he be seen immediately in his office. Patient given the address to Dr. Prudencio Burly office and he will go immediately.   Smitty Cords, MD [place], PGY-3

## 2016-11-02 ENCOUNTER — Emergency Department (HOSPITAL_COMMUNITY)
Admission: EM | Admit: 2016-11-02 | Discharge: 2016-11-03 | Disposition: A | Discharge status: Home / Self Care | Payer: BC Managed Care – PPO | Attending: Emergency Medicine | Admitting: Emergency Medicine

## 2016-11-02 ENCOUNTER — Encounter (HOSPITAL_COMMUNITY): Payer: Self-pay | Admitting: Emergency Medicine

## 2016-11-02 ENCOUNTER — Emergency Department (HOSPITAL_COMMUNITY): Payer: BC Managed Care – PPO

## 2016-11-02 DIAGNOSIS — Z791 Long term (current) use of non-steroidal anti-inflammatories (NSAID): Secondary | ICD-10-CM | POA: Diagnosis not present

## 2016-11-02 DIAGNOSIS — R200 Anesthesia of skin: Secondary | ICD-10-CM | POA: Diagnosis present

## 2016-11-02 DIAGNOSIS — Z79899 Other long term (current) drug therapy: Secondary | ICD-10-CM | POA: Insufficient documentation

## 2016-11-02 DIAGNOSIS — G510 Bell's palsy: Secondary | ICD-10-CM | POA: Diagnosis not present

## 2016-11-02 HISTORY — DX: Obesity, unspecified: E66.9

## 2016-11-02 LAB — COMPREHENSIVE METABOLIC PANEL
ALBUMIN: 3.9 g/dL (ref 3.5–5.0)
ALT: 34 U/L (ref 17–63)
AST: 25 U/L (ref 15–41)
Alkaline Phosphatase: 76 U/L (ref 38–126)
Anion gap: 8 (ref 5–15)
BUN: 14 mg/dL (ref 6–20)
CHLORIDE: 104 mmol/L (ref 101–111)
CO2: 24 mmol/L (ref 22–32)
Calcium: 9.3 mg/dL (ref 8.9–10.3)
Creatinine, Ser: 1.15 mg/dL (ref 0.61–1.24)
GFR calc Af Amer: >60 mL/min (ref >60)
GLUCOSE: 108 mg/dL — AB (ref 65–99)
Potassium: 3.8 mmol/L (ref 3.5–5.1)
Sodium: 136 mmol/L (ref 135–145)
Total Bilirubin: 0.4 mg/dL (ref 0.3–1.2)
Total Protein: 7.9 g/dL (ref 6.5–8.1)

## 2016-11-02 LAB — CBC
HEMATOCRIT: 45.9 % (ref 39.0–52.0)
HEMOGLOBIN: 15.2 g/dL (ref 13.0–17.0)
MCH: 28.8 pg (ref 26.0–34.0)
MCHC: 33.1 g/dL (ref 30.0–36.0)
MCV: 87.1 fL (ref 78.0–100.0)
Platelets: 257 10*3/uL (ref 150–400)
RBC: 5.27 MIL/uL (ref 4.22–5.81)
RDW: 13.6 % (ref 11.5–15.5)
WBC: 8.3 10*3/uL (ref 4.0–10.5)

## 2016-11-02 LAB — I-STAT CHEM 8, ED
BUN: 16 mg/dL (ref 6–20)
CHLORIDE: 104 mmol/L (ref 101–111)
CREATININE: 1.2 mg/dL (ref 0.61–1.24)
Calcium, Ion: 1.17 mmol/L (ref 1.15–1.40)
Glucose, Bld: 108 mg/dL — ABNORMAL HIGH (ref 65–99)
HEMATOCRIT: 47 % (ref 39.0–52.0)
Hemoglobin: 16 g/dL (ref 13.0–17.0)
POTASSIUM: 3.9 mmol/L (ref 3.5–5.1)
SODIUM: 141 mmol/L (ref 135–145)
TCO2: 26 mmol/L (ref 22–32)

## 2016-11-02 LAB — I-STAT TROPONIN, ED: TROPONIN I, POC: 0 ng/mL (ref 0.00–0.08)

## 2016-11-02 LAB — APTT: APTT: 31 s (ref 24–36)

## 2016-11-02 LAB — DIFFERENTIAL
BASOS ABS: 0 10*3/uL (ref 0.0–0.1)
BASOS PCT: 0 %
Eosinophils Absolute: 0.2 10*3/uL (ref 0.0–0.7)
Eosinophils Relative: 2 %
Lymphocytes Relative: 43 %
Lymphs Abs: 3.6 10*3/uL (ref 0.7–4.0)
MONOS PCT: 6 %
Monocytes Absolute: 0.5 10*3/uL (ref 0.1–1.0)
NEUTROS ABS: 4.1 10*3/uL (ref 1.7–7.7)
NEUTROS PCT: 49 %

## 2016-11-02 LAB — CBG MONITORING, ED: Glucose-Capillary: 108 mg/dL — ABNORMAL HIGH (ref 65–99)

## 2016-11-02 LAB — PROTIME-INR
INR: 0.92
Prothrombin Time: 12.3 seconds (ref 11.4–15.2)

## 2016-11-02 MED ORDER — GADOBENATE DIMEGLUMINE 529 MG/ML IV SOLN
20.0000 mL | Freq: Once | INTRAVENOUS | Status: AC | PRN
  Administered 2016-11-02: 20 mL via INTRAVENOUS

## 2016-11-02 NOTE — ED Notes (Signed)
Patient transported to MRI 

## 2016-11-02 NOTE — ED Notes (Signed)
activated code stroke with [facility]

## 2016-11-02 NOTE — ED Notes (Signed)
Pt in [place] [address], pt had onset of R sided facial numbness and R sided facial droop. Code Stroke called in Lelon Frohlich MD cleared pt.

## 2016-11-02 NOTE — Consult Note (Signed)
NEURO HOSPITALIST CONSULT NOTE   Requestig physician: Dr. Johnney Killian  Reason for Consult: Right facial droop  History obtained from:  Patient     HPI:                                                                                                                                          Derrick Scott is an 41 y.o. male with a history of left corneal transplant, recent diagnosis and  of viral infection of his right eye treated with topical steroid and antibiotic, morbid obesity and benign paroxysmal vertigo, who presents with new onset of weakness of the right upper and lower quadrants of his face. Code Stroke was called in triage, then cancelled when exam findings were noted to be most consistent with Bell's palsy. He denies limb weakness, sensory changes, confusion, or trouble with speech. No incoordination. He endorses having had lymph node swelling under his jaw as well as along his right parotid gland recently, in association with the right eye infection.   Past Medical History:  Diagnosis Date  . Medial meniscus tear 01/2012   right knee  . Obesity   . Pneumonia   . Renal disorder   . Vertigo, benign paroxysmal 2015    Past Surgical History:  Procedure Laterality Date  . CORNEAL TRANSPLANT  12/2009  . KNEE ARTHROSCOPY  02/20/2012   Procedure: ARTHROSCOPY KNEE;  Surgeon: Alta Corning, MD;  Location: [place];  Service: Orthopedics;  Laterality: Right;  Chondroplasia patella femoral joint, medial meniscal debriedment    Family History  Problem Relation Age of Onset  . Asthma Mother   . Diabetes Father   . Cancer Father        prostate  . Osteoarthritis Sister   . Diabetes Sister    Social History:  reports that he has never smoked. He has never used smokeless tobacco. He reports that he does not drink alcohol or use drugs.  No Known Allergies  HOME MEDICATIONS:                                                                                                                        ROS:  As per HPI.   Blood pressure (!) 146/91, pulse (!) 102, temperature 98.6 F (37 C), temperature source Oral, resp. rate 18, height 5\' 8"  (1.727 m), weight 120.2 kg (265 lb), SpO2 96 %.  General Examination:                                                                                                      HEENT-  [place]/AT. Otoscopic examination reveals normal TMs and no vesicles in left or right external auditory canals.   Lungs- Respirations unlabored Extremities- No edema  Neurological Examination Mental Status: Alert, oriented, thought content appropriate.  Speech fluent without evidence of aphasia.  Able to follow all commands without difficulty. Cranial Nerves: Scott: PERRL. Decreased visual acuity noted. Visual fields intact.  III,IV, VI: Decreased strength of right eyelid closure, in addition to slightly delayed movement of right eyelid relative to left when blinking spontaneously. EOMI without nystagmus.  V,VII: Mild droop involving right lower quadrant of face. Facial temp sensation normal bilaterally VIII: Hearing intact to voice IX,X: No hypophonia or pharyngeal dysarthria XI: Symmetric XII: Midline tongue extension. No lingual dysarthria. Motor: Right : Upper extremity   5/5    Left:     Upper extremity   5/5  Lower extremity   5/5     Lower extremity   5/5 Normal tone throughout; no atrophy noted Sensory: Temp and light touch intact x 4 without extinction Deep Tendon Reflexes: 2+ and symmetric throughout Plantars: Right: downgoing  Left: downgoing Cerebellar: No ataxia with FNF bilaterally Gait: No truncal ataxia. Able to transfer with minimal assistance from CT table to bed.   Lab Results: Basic Metabolic Panel:  Recent Labs Lab 11/02/16 1915 11/02/16 1930  NA 136 141  K 3.8 3.9  CL  104 104  CO2 24  --   GLUCOSE 108* 108*  BUN 14 16  CREATININE 1.15 1.20  CALCIUM 9.3  --     Liver Function Tests:  Recent Labs Lab 11/02/16 1915  AST 25  ALT 34  ALKPHOS 76  BILITOT 0.4  PROT 7.9  ALBUMIN 3.9   No results for input(s): LIPASE, AMYLASE in the last 168 hours. No results for input(s): AMMONIA in the last 168 hours.  CBC:  Recent Labs Lab 11/02/16 1915 11/02/16 1930  WBC 8.3  --   NEUTROABS 4.1  --   HGB 15.2 16.0  HCT 45.9 47.0  MCV 87.1  --   PLT 257  --     Cardiac Enzymes: No results for input(s): CKTOTAL, CKMB, CKMBINDEX, TROPONINI in the last 168 hours.  Lipid Panel: No results for input(s): CHOL, TRIG, HDL, CHOLHDL, VLDL, LDLCALC in the last 168 hours.  CBG:  Recent Labs Lab 11/02/16 1918  GLUCAP 108*    Microbiology: Results for orders placed or performed during the hospital encounter of 04/08/16  Rapid strep screen     Status: None   Collection Time: 04/08/16  1:25 PM  Result Value Ref Range Status   Streptococcus, Group A Screen (Direct) NEGATIVE NEGATIVE Final    Comment: (NOTE) A  Rapid Antigen test may result negative if the antigen level in the sample is below the detection level of this test. The FDA has not cleared this test as a stand-alone test therefore the rapid antigen negative result has reflexed to a Group A Strep culture.   Culture, group A strep     Status: None   Collection Time: 04/08/16  1:25 PM  Result Value Ref Range Status   Specimen Description THROAT  Final   Special Requests NONE Reflexed from [id number]  Final   Culture   Final    NO GROUP A STREP (S.PYOGENES) ISOLATED Performed at [facility], [address], [place], [place] [address]    Report Status 04/11/2016 FINAL  Final    Coagulation Studies:  Recent Labs  11/02/16 1915  LABPROT 12.3  INR 0.92    Imaging: Ct Head Code Stroke Wo Contrast  Addendum Date: 11/02/2016   ADDENDUM REPORT: 11/02/2016 19:38 ADDENDUM: These  results were called by telephone at the time of interpretation on 11/02/2016 at 7:38 pm to Dr. Kerney Elbe, who verbally acknowledged these results. Electronically Signed   By: Ulyses Jarred M.D.   On: 11/02/2016 19:38   Result Date: 11/02/2016 CLINICAL DATA:  Code stroke.  Right-sided weakness and numbness EXAM: CT HEAD WITHOUT CONTRAST TECHNIQUE: Contiguous axial images were obtained from the base of the skull through the vertex without intravenous contrast. COMPARISON:  None. FINDINGS: Brain: No mass lesion or acute hemorrhage. No focal hypoattenuation of the basal ganglia or cortex to indicate infarcted tissue. No hydrocephalus or age advanced atrophy. Vascular: No hyperdense vessel. No advanced atherosclerotic calcification of the arteries at the skull base. Skull: Normal visualized skull base, calvarium and extracranial soft tissues. Sinuses/Orbits: No sinus fluid levels or advanced mucosal thickening. No mastoid effusion. Normal orbits. ASPECTS [facility] Stroke Program Early CT Score) - Ganglionic level infarction (caudate, lentiform nuclei, internal capsule, insula, M1-M3 cortex): 7 - Supraganglionic infarction (M4-M6 cortex): 3 Total score (0-10 with 10 being normal): 10 IMPRESSION: 1. Normal head CT. 2. ASPECTS is 10. Dr.  Kerney Elbe was paged at 7:32 p.m. on 11/02/2016. Electronically Signed: By: Ulyses Jarred M.D. On: 11/02/2016 19:33   Assessment: 41 year old male presenting with new right facial droop 1. Exam findings most consistent with right Bell's palsy. No additional findings to suggest brainstem or cerebral hemisphere lesion, but will need to rule out with MRI.  2. Recent viral infection of right eye. Description of symptoms provided by patient suggests a viral conjunctivitis with associated lymph node and right parotid swelling. The latter could be associated with inflammation/compression of the right facial nerve, or there may be HSV reactivation within/near the geniculate ganglion, a  more typical location for the pathology in Bell's palsy.  3. Also to be ruled out with MRI would be an IAC or cerebellopontine angle lesion on the right.  4. No EAC vesicles to suggest Ramsay-Hunt syndrome.   Recommendations: 1. MRI brain with and without contrast.  2. If MRI brain is negative, recommend treatment with prednisone and acyclovir/valacyclovir per standard protocol for Bell's palsy 3. Will need outpatient Neurology follow up  Electronically signed: Dr. Kerney Elbe 11/02/2016, 8:05 PM

## 2016-11-03 MED ORDER — VALACYCLOVIR HCL 500 MG PO TABS
1000.0000 mg | ORAL_TABLET | Freq: Once | ORAL | Status: AC
  Administered 2016-11-03: 1000 mg via ORAL
  Filled 2016-11-03: qty 2

## 2016-11-03 MED ORDER — PREDNISONE 20 MG PO TABS: ORAL_TABLET | ORAL | 0 refills | Status: DC

## 2016-11-03 MED ORDER — PREDNISONE 20 MG PO TABS
60.0000 mg | ORAL_TABLET | Freq: Once | ORAL | Status: AC
  Administered 2016-11-03: 60 mg via ORAL
  Filled 2016-11-03: qty 3

## 2016-11-03 MED ORDER — VALACYCLOVIR HCL 1 G PO TABS: 1000.0000 mg | ORAL_TABLET | Freq: Three times a day (TID) | ORAL | 0 refills | Status: AC

## 2016-11-03 NOTE — ED Provider Notes (Signed)
[facility] EMERGENCY DEPARTMENT Provider Note   CSN: [id number] Arrival date & time: 11/02/16  1907   An emergency department physician performed an initial assessment on this suspected stroke patient at 20.  History   Chief Complaint Chief Complaint  Patient presents with  . Numbness    HPI Derrick Scott is a 41 y.o. male.  HPI atient developed a feeling of weakness and numbness on the right side of his face with a droop affecting 1 hour prior to arrival. No associated general headache, weakness numbness or tingling of extremities. No visual loss the patient was getting treated for a virus of the right eye with a topical steroid and antibiotic. Past Medical History:  Diagnosis Date  . Medial meniscus tear 01/2012   right knee  . Obesity   . Pneumonia   . Renal disorder   . Vertigo, benign paroxysmal 2015    Patient Active Problem List   Diagnosis Date Noted  . Redness of eye, right 10/06/2016  . Ankle impingement syndrome, left 07/08/2016  . Well adult exam 05/19/2016  . Laryngopharyngeal reflux (LPR) 07/24/2015  . Post corneal transplant 07/25/2014  . 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 Adrenal mass, right ([place]) 05/22/2013  . Sleep apnea 07/31/2009  . OBESITY, NOS 03/16/2006    Past Surgical History:  Procedure Laterality Date  . CORNEAL TRANSPLANT  12/2009  . KNEE ARTHROSCOPY  02/20/2012   Procedure: ARTHROSCOPY KNEE;  Surgeon: Alta Corning, MD;  Location: [place];  Service: Orthopedics;  Laterality: Right;  Chondroplasia patella femoral joint, medial meniscal debriedment       Home Medications    Prior to Admission medications   Medication Sig Start Date End Date Taking? Authorizing Provider  meloxicam (MOBIC) 15 MG tablet Take one by mouth daily as needed for muscle and joint pain do not take with OTC ibuprofen Patient taking differently: Take 15 mg by mouth daily as needed (for muscle and  joint pain/avoid OTC Ibuprofen).  05/18/16  Yes Dickie La, MD  Multiple Vitamins-Minerals (ADULT ONE DAILY GUMMIES) CHEW Chew 1 tablet by mouth daily.   Yes [provider]  NON FORMULARY Conjugated linoleic acid (CLA) capsules: Take 1 capsule by mouth three times a day   Yes [provider]  NON FORMULARY Nitric Oxide Powder: Mix 2 scoopsful into 6 ounces of water and drink once a day/PRE-WORKOUT   Yes [provider]  NON FORMULARY BCAA (Branched-Chain Amino Acids) powder: Mix 1 scoopful into 6 ounces of water and drink 2 times a day/BEFORE and AFTER workouts   Yes [provider]  prednisoLONE acetate (PRED FORTE) 1 % ophthalmic suspension Place 2 drops into both eyes 2 (two) times daily.   Yes [provider]  Whey Protein POWD See admin instructions. Mix 1 scoopful with 6 ounces of water and drink once a day/POST-WORKOUT   Yes [provider]  colchicine 0.6 MG tablet Take 1 tablet (0.6 mg total) by mouth daily. Patient not taking: Reported on 11/02/2016 06/17/16   Dickie La, MD  erythromycin ophthalmic ointment Place 1 application into the right eye 4 (four) times daily. Patient not taking: Reported on 11/02/2016 10/04/16   Nicolette Bang, DO  predniSONE (DELTASONE) 20 MG tablet 3 tabs po daily x 3 days, then 2 tabs x 3 days, then 1.5 tabs x 3 days, then 1 tab x 3 days, then 0.5 tabs x 3 days 11/03/16  Charlesetta Shanks, MD  trimethoprim-polymyxin b (POLYTRIM) ophthalmic solution Place 1 drop into the right eye 4 (four) times daily. Patient not taking: Reported on 11/02/2016 10/06/16   Nicolette Bang, DO  valACYclovir (VALTREX) 1000 MG tablet Take 1 tablet (1,000 mg total) by mouth 3 (three) times daily. 11/03/16 11/17/16  Charlesetta Shanks, MD    Family History Family History  Problem Relation Age of Onset  . Asthma Mother   . Diabetes Father   . Cancer Father        prostate  . Osteoarthritis Sister   . Diabetes  Sister     Social History Social History  Substance Use Topics  . Smoking status: Never Smoker  . Smokeless tobacco: Never Used  . Alcohol use No     Allergies   Patient has no known allergies.   Review of Systems Review of Systems 10 Systems reviewed and are negative for acute change except as noted in the HPI.   Physical Exam Updated Vital Signs BP (!) 192/84   Pulse 83   Temp 98.6 F (37 C)   Resp (!) 21   Ht 5\' 8"  (1.727 m)   Wt 120.2 kg (265 lb)   SpO2 100%   BMI 40.29 kg/m   Physical Exam  Constitutional: He is oriented to person, place, and time. He appears well-developed and well-nourished.  HENT:  Head: Normocephalic and atraumatic.  Right Ear: External ear normal.  Left Ear: External ear normal.  Nose: Nose normal.  Mouth/Throat: Oropharynx is clear and moist.  Eyes: Pupils are equal, round, and reactive to light. Conjunctivae and EOM are normal.  Neck: Neck supple.  Cardiovascular: Normal rate and regular rhythm.   No murmur heard. Pulmonary/Chest: Effort normal and breath sounds normal. No respiratory distress.  Abdominal: Soft. There is no tenderness.  Musculoskeletal: He exhibits no edema or tenderness.  Neurological: He is alert and oriented to person, place, and time. A cranial nerve deficit is present. No sensory deficit. He exhibits normal muscle tone. Coordination normal.  Slight droop of the right mouth. Lid lag on right  Skin: Skin is warm and dry.  Psychiatric: He has a normal mood and affect.  Nursing note and vitals reviewed.    ED Treatments / Results  Labs (all labs ordered are listed, but only abnormal results are displayed) Labs Reviewed  COMPREHENSIVE METABOLIC PANEL - Abnormal; Notable for the following:       Result Value   Glucose, Bld 108 (*)    All other components within normal limits  CBG MONITORING, ED - Abnormal; Notable for the following:    Glucose-Capillary 108 (*)    All other components within normal limits    I-STAT CHEM 8, ED - Abnormal; Notable for the following:    Glucose, Bld 108 (*)    All other components within normal limits  PROTIME-INR  APTT  CBC  DIFFERENTIAL  I-STAT TROPONIN, ED    EKG  EKG Interpretation  Date/Time:  Wednesday November 02 2016 19:16:03 EDT Ventricular Rate:  95 PR Interval:  154 QRS Duration: 90 QT Interval:  322 QTC Calculation: 404 R Axis:   -62 Text Interpretation:  Normal sinus rhythm Left anterior fascicular block Abnormal ECG no STEMI no sig change from previous Confirmed by Charlesetta Shanks [phone]) on 11/02/2016 8:33:32 PM       Radiology Mr Brain W And Wo Contrast  Result Date: 11/03/2016 CLINICAL DATA:  41 y/o M; right facial weakness. Recent viral infection of the  right eye. EXAM: MRI HEAD WITHOUT AND WITH CONTRAST TECHNIQUE: Multiplanar, multiecho pulse sequences of the brain and surrounding structures were obtained without and with intravenous contrast. CONTRAST:  35mL MULTIHANCE GADOBENATE DIMEGLUMINE 529 MG/ML IV SOLN COMPARISON:  11/02/2016 CT head FINDINGS: Brain: No acute infarction, hemorrhage, hydrocephalus, extra-axial collection or mass lesion. No abnormal enhancement of the brain parenchyma. Vascular: Normal flow voids. Skull and upper cervical spine: Normal marrow signal. Sinuses/Orbits: Negative. Other: Subtle increased signal within the right labyrinthine segment of facial nerve on the postcontrast axial T1 sequence (series 11, image 12). IMPRESSION: 1. Possible faint enhancement within right labyrinthine segment the facial nerve may represent neuritis, typically of viral etiology and compatible with Bell's palsy. No mass or complicating features. 2. No evidence of acute infarct, hemorrhage, or abnormal enhancement of the brain. Electronically Signed   By: Kristine Garbe M.D.   On: 11/03/2016 00:07   Ct Head Code Stroke Wo Contrast  Addendum Date: 11/02/2016   ADDENDUM REPORT: 11/02/2016 19:38 ADDENDUM: These results were  called by telephone at the time of interpretation on 11/02/2016 at 7:38 pm to Dr. Kerney Elbe, who verbally acknowledged these results. Electronically Signed   By: Ulyses Jarred M.D.   On: 11/02/2016 19:38   Result Date: 11/02/2016 CLINICAL DATA:  Code stroke.  Right-sided weakness and numbness EXAM: CT HEAD WITHOUT CONTRAST TECHNIQUE: Contiguous axial images were obtained from the base of the skull through the vertex without intravenous contrast. COMPARISON:  None. FINDINGS: Brain: No mass lesion or acute hemorrhage. No focal hypoattenuation of the basal ganglia or cortex to indicate infarcted tissue. No hydrocephalus or age advanced atrophy. Vascular: No hyperdense vessel. No advanced atherosclerotic calcification of the arteries at the skull base. Skull: Normal visualized skull base, calvarium and extracranial soft tissues. Sinuses/Orbits: No sinus fluid levels or advanced mucosal thickening. No mastoid effusion. Normal orbits. ASPECTS [facility] Stroke Program Early CT Score) - Ganglionic level infarction (caudate, lentiform nuclei, internal capsule, insula, M1-M3 cortex): 7 - Supraganglionic infarction (M4-M6 cortex): 3 Total score (0-10 with 10 being normal): 10 IMPRESSION: 1. Normal head CT. 2. ASPECTS is 10. Dr.  Kerney Elbe was paged at 7:32 p.m. on 11/02/2016. Electronically Signed: By: Ulyses Jarred M.D. On: 11/02/2016 19:33    Procedures Procedures (including critical care time)  Medications Ordered in ED Medications  predniSONE (DELTASONE) tablet 60 mg (not administered)  valACYclovir (VALTREX) tablet 1,000 mg (not administered)  gadobenate dimeglumine (MULTIHANCE) injection 20 mL (20 mLs Intravenous Contrast Given 11/02/16 2340)     Initial Impression / Assessment and Plan / ED Course  I have reviewed the triage vital signs and the nursing notes.  Pertinent labs & imaging results that were available during my care of the patient were reviewed by me and considered in my medical  decision making (see chart for details).     Consult: Seen by Dr. Terrence Dupont code stroke. Dr. Terrence Dupont instructions MRI with and without contrast obtained. Plan fordischarge with acyclovir/l cycle where and prednisone.  Final Clinical Impressions(s) / ED Diagnoses   Final diagnoses:  Bell's palsy   Patient alert and nontoxic. Mental status is clear. Neurologic exam otherwise normal.Return precautions reviewed.Agent counseled on management of eye with  Lubricant agents and taping at night. New Prescriptions New Prescriptions   PREDNISONE (DELTASONE) 20 MG TABLET    3 tabs po daily x 3 days, then 2 tabs x 3 days, then 1.5 tabs x 3 days, then 1 tab x 3 days, then 0.5 tabs x 3 days  VALACYCLOVIR (VALTREX) 1000 MG TABLET    Take 1 tablet (1,000 mg total) by mouth 3 (three) times daily.     Charlesetta Shanks, MD 11/03/16 [phone]

## 2016-12-14 ENCOUNTER — Other Ambulatory Visit: Payer: Self-pay | Admitting: Family Medicine

## 2016-12-14 MED ORDER — PENICILLIN V POTASSIUM 500 MG PO TABS: 500.0000 mg | ORAL_TABLET | Freq: Four times a day (QID) | ORAL | 0 refills | Status: DC

## 2017-01-26 ENCOUNTER — Encounter: Payer: Self-pay | Admitting: Sports Medicine

## 2017-01-26 ENCOUNTER — Ambulatory Visit (INDEPENDENT_AMBULATORY_CARE_PROVIDER_SITE_OTHER): Payer: BC Managed Care – PPO | Admitting: Sports Medicine

## 2017-01-26 DIAGNOSIS — M2142 Flat foot [pes planus] (acquired), left foot: Secondary | ICD-10-CM

## 2017-01-26 DIAGNOSIS — M2141 Flat foot [pes planus] (acquired), right foot: Secondary | ICD-10-CM

## 2017-01-26 DIAGNOSIS — M25872 Other specified joint disorders, left ankle and foot: Secondary | ICD-10-CM | POA: Diagnosis not present

## 2017-01-26 NOTE — Progress Notes (Signed)
   HPI [name] is a 42 year old male with PMHx significant for who presents to the clinic with CC of Bilateral medial ankle pain. Patient was fitted with orthothotics back in June 2018 and has been wearing them in his work boots on a daily bases since. His pain was initially relieved by the orthoptic. However, within the last 3-4 weeks, he has been experiencing progressively worsening of pain in both his feet. When he wears his orthotics he states that there is a part of his mid foot that feels "unsupported" and this is one area he has significant pain. Additionally, he currently does not have orthotics fitted for any of his other shoes. Now, he is hardly able to walk on the ground barefoot secondary to pain. He endorses a 7-8/10 sharp shooting pain in his medial ankle radiating to his mid-dorsal foot. Pain is alleviated by rest and aggravated by standing and ambulation. Of note, he works as a Designer, multimedia and states that it has gotten progressively more difficult for him to be on his feet for work.   OBJECTIVE General: Heavy-set [place] no dysmorphic features; no signs of trauma ENT: oropharynx moist, no lesions, no caries present, nares without discharge Eye: sclerae white, no discharge, normal EOM MSK: Depressed arch with slightly inverted foot, splaying at the 2nd, 3rd, and 4th digits, Rigid range of motion of b/l hallux, Full ROM of b/l metatarsals Mild crepitis/"catching" with rotation of foot at the ankle joint, navicular bone dropped, neurovascularly intact SKIN: Bruise over medial calcaneus , no rashes, normal skin turgor Neuro: Abnormal gait, limited by pain No obvious cranial nerve deficits   ASSESSMENT Eluterio is a 42 y/o male with likely continued ankle impingement secondary to pes planus that has been ineffectively controlled by his current orthotics as well as exacerbated by the lack of support in his non-orthotic fitted shoes. He requires increased arch  support from his current orthotic to improve his mechanical stability and pain.  He was fitted with new temporary orthotic with added medial arch support from previous and reported moderate improvement in his pain and support.    PLAN - Plan to have patient use today's orthotic in non-work shoes - Reviewed foot and ankle stretches for improving strength - Encouraged continuing non-weight bearing exercises to achieve addtional weight loss which will likely improve foot pain - Plan to follow up in 4-6 weeks as needed to assess for improvement May need new custom orthotics in other shoes    I observed and examined the patient with the resident and agree with assessment and plan.  Note reviewed and modified by me. Karl B. Oneida Alar, MD

## 2017-02-07 DIAGNOSIS — M2140 Flat foot [pes planus] (acquired), unspecified foot: Secondary | ICD-10-CM | POA: Insufficient documentation

## 2017-02-07 NOTE — Assessment & Plan Note (Signed)
With ongoing foot and arch pain as well as ankle we need to work on finding best custom inserts and shoes for his work and activity  See note

## 2017-02-07 NOTE — Assessment & Plan Note (Signed)
Continued pressure in many shoes  Inserts help lessen ankle pain  Likely need to make more custom ones as his weight is a factor in needing additional support

## 2017-02-11 ENCOUNTER — Other Ambulatory Visit: Payer: Self-pay

## 2017-02-11 ENCOUNTER — Emergency Department (HOSPITAL_BASED_OUTPATIENT_CLINIC_OR_DEPARTMENT_OTHER)
Admission: EM | Admit: 2017-02-11 | Discharge: 2017-02-11 | Disposition: A | Discharge status: Home / Self Care | Payer: BC Managed Care – PPO | Attending: Emergency Medicine | Admitting: Emergency Medicine

## 2017-02-11 ENCOUNTER — Emergency Department (HOSPITAL_BASED_OUTPATIENT_CLINIC_OR_DEPARTMENT_OTHER): Payer: BC Managed Care – PPO

## 2017-02-11 ENCOUNTER — Encounter (HOSPITAL_BASED_OUTPATIENT_CLINIC_OR_DEPARTMENT_OTHER): Payer: Self-pay | Admitting: Emergency Medicine

## 2017-02-11 DIAGNOSIS — Z79899 Other long term (current) drug therapy: Secondary | ICD-10-CM | POA: Diagnosis not present

## 2017-02-11 DIAGNOSIS — J111 Influenza due to unidentified influenza virus with other respiratory manifestations: Secondary | ICD-10-CM | POA: Insufficient documentation

## 2017-02-11 DIAGNOSIS — R509 Fever, unspecified: Secondary | ICD-10-CM | POA: Diagnosis present

## 2017-02-11 DIAGNOSIS — R69 Illness, unspecified: Secondary | ICD-10-CM

## 2017-02-11 LAB — RAPID STREP SCREEN (MED CTR MEBANE ONLY): Streptococcus, Group A Screen (Direct): NEGATIVE

## 2017-02-11 MED ORDER — FLUTICASONE PROPIONATE 50 MCG/ACT NA SUSP: 2.0000 | Freq: Every day | NASAL | 0 refills | Status: DC

## 2017-02-11 MED ORDER — ACETAMINOPHEN 325 MG PO TABS
650.0000 mg | ORAL_TABLET | Freq: Once | ORAL | Status: AC | PRN
  Administered 2017-02-11: 650 mg via ORAL
  Filled 2017-02-11: qty 2

## 2017-02-11 MED ORDER — BENZONATATE 100 MG PO CAPS: 100.0000 mg | ORAL_CAPSULE | Freq: Three times a day (TID) | ORAL | 0 refills | Status: DC | PRN

## 2017-02-11 MED ORDER — NAPROXEN 375 MG PO TABS: 375.0000 mg | ORAL_TABLET | Freq: Two times a day (BID) | ORAL | 0 refills | Status: DC

## 2017-02-11 MED ORDER — OSELTAMIVIR PHOSPHATE 75 MG PO CAPS: 75.0000 mg | ORAL_CAPSULE | Freq: Two times a day (BID) | ORAL | 0 refills | Status: DC

## 2017-02-11 MED ORDER — IBUPROFEN 800 MG PO TABS
800.0000 mg | ORAL_TABLET | Freq: Once | ORAL | Status: AC
  Administered 2017-02-11: 800 mg via ORAL
  Filled 2017-02-11: qty 1

## 2017-02-11 NOTE — ED Provider Notes (Signed)
[facility]ERGENC[facility] DEPARTMENT Provider Note   CSN: [id number] Arrival date & time: 02/11/17  1531     History   Chief Complaint Chief Complaint  Patient presents with  . Flu like symptoms    HPI Derrick Scott is a 42 y.o. male.  Derrick Scott is a 42 y.o. Male presents to the emergency department complaining of flulike symptoms since yesterday.  Patient reports his symptoms began with fever, chills, sneezing and nasal congestion.  He reports associated coughing, sore throat, headache and postnasal drip.  He did not receive his flu shot this year.  He reports taking NyQuil early this morning with some relief of his symptoms.  No other treatments prior to arrival.  He denies shortness of breath, wheezing, palpitations, lightheadedness, dizziness, abdominal pain, nausea, vomiting, urinary symptoms or rashes.      Past Medical History:  Diagnosis Date  . Medial meniscus tear 01/2012   right knee  . Obesity   . Pneumonia   . Renal disorder   . Vertigo, benign paroxysmal 2015    Patient Active Problem List   Diagnosis Date Noted  . Pes planus 02/07/2017  . Redness of eye, right 10/06/2016  . Ankle impingement syndrome, left 07/08/2016  . Well adult exam 05/19/2016  . Laryngopharyngeal reflux (LPR) 07/24/2015  . Post corneal transplant 07/25/2014  . 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 Adrenal mass, right ([place]) 05/22/2013  . Sleep apnea 07/31/2009  . OBESITY, NOS 03/16/2006    Past Surgical History:  Procedure Laterality Date  . CORNEAL TRANSPLANT  12/2009  . KNEE ARTHROSCOPY  02/20/2012   Procedure: ARTHROSCOPY KNEE;  Surgeon: Alta Corning, MD;  Location: [facility];  Service: Orthopedics;  Laterality: Right;  Chondroplasia patella femoral joint, medial meniscal debriedment       Home Medications    Prior to Admission medications   Medication Sig Start Date End Date Taking? Authorizing Provider    benzonatate (TESSALON) 100 MG capsule Take 1 capsule (100 mg total) by mouth 3 (three) times daily as needed. 02/11/17   Waynetta Pean, PA-C  fluticasone (FLONASE) 50 MCG/ACT nasal spray Place 2 sprays into both nostrils daily. 02/11/17   Waynetta Pean, PA-C  Multiple Vitamins-Minerals (ADULT ONE DAILY GUMMIES) CHEW Chew 1 tablet by mouth daily.    [provider]  naproxen (NAPROSYN) 375 MG tablet Take 1 tablet (375 mg total) by mouth 2 (two) times daily with a meal. 02/11/17   Waynetta Pean, PA-C  NON FORMULARY Conjugated linoleic acid (CLA) capsules: Take 1 capsule by mouth three times a day    [provider]  NON FORMULARY Nitric Oxide Powder: Mix 2 scoopsful into 6 ounces of water and drink once a day/PRE-WORKOUT    [provider]  NON FORMULARY BCAA (Branched-Chain Amino Acids) powder: Mix 1 scoopful into 6 ounces of water and drink 2 times a day/BEFORE and AFTER workouts    [provider]  oseltamivir (TAMIFLU) 75 MG capsule Take 1 capsule (75 mg total) by mouth every 12 (twelve) hours. 02/11/17   Waynetta Pean, PA-C  Whey Protein POWD See admin instructions. Mix 1 scoopful with 6 ounces of water and drink once a day/POST-WORKOUT    [provider]    Family History Family History  Problem Relation Age of Onset  . Asthma Mother   . Diabetes Father   . Cancer Father        prostate  . Osteoarthritis Sister   .  Diabetes Sister     Social History Social History   Tobacco Use  . Smoking status: Never Smoker  . Smokeless tobacco: Never Used  Substance Use Topics  . Alcohol use: No  . Drug use: No     Allergies   Patient has no known allergies.   Review of Systems Review of Systems  Constitutional: Positive for chills and fever.  HENT: Positive for congestion, postnasal drip, rhinorrhea, sneezing and sore throat. Negative for trouble swallowing.   Eyes: Negative for visual disturbance.  Respiratory: Positive for cough.  Negative for shortness of breath and wheezing.   Cardiovascular: Negative for chest pain and palpitations.  Gastrointestinal: Negative for abdominal pain, diarrhea, nausea and vomiting.  Genitourinary: Negative for dysuria.  Musculoskeletal: Positive for myalgias. Negative for back pain, neck pain and neck stiffness.  Skin: Negative for rash.  Neurological: Positive for headaches. Negative for dizziness, syncope, weakness and light-headedness.     Physical Exam Updated Vital Signs BP 123/75 (BP Location: Right Arm)   Pulse (!) 110   Temp 100.1 F (37.8 C) (Oral)   Resp 20   Ht 5\' 8"  (1.727 m)   Wt 120.2 kg (265 lb)   SpO2 97%   BMI 40.29 kg/m   Physical Exam  Constitutional: He is oriented to person, place, and time. He appears well-developed and well-nourished. No distress.  Nontoxic-appearing.  HENT:  Head: Normocephalic and atraumatic.  Right Ear: External ear normal.  Left Ear: External ear normal.  Mouth/Throat: No oropharyngeal exudate.  Boggy nasal turbinates and rhinorrhea present bilaterally. Mild clear middle ear effusion noted bilaterally without TM erythema or loss of landmarks. Mild bilateral tonsillar hypertrophy without exudates.  Uvula is midline without edema.  No peritonsillar abscess.  No trismus.  No drooling.  Eyes: Conjunctivae are normal. Pupils are equal, round, and reactive to light. Right eye exhibits no discharge. Left eye exhibits no discharge.  Neck: Normal range of motion. Neck supple. No JVD present. No tracheal deviation present.  No meningeal signs.  Cardiovascular: Normal rate, regular rhythm, normal heart sounds and intact distal pulses. Exam reveals no gallop and no friction rub.  No murmur heard. Heart rate 100 on exam.  Pulmonary/Chest: Effort normal and breath sounds normal. No stridor. No respiratory distress. He has no wheezes. He has no rales.  Lungs are clear to ascultation bilaterally. Symmetric chest expansion bilaterally. No  increased work of breathing. No rales or rhonchi.    Abdominal: Soft. There is no tenderness.  Musculoskeletal: He exhibits no edema or tenderness.  Lymphadenopathy:    He has no cervical adenopathy.  Neurological: He is alert and oriented to person, place, and time. Coordination normal.  Skin: Skin is warm and dry. Capillary refill takes less than 2 seconds. No rash noted. He is not diaphoretic. No erythema. No pallor.  Psychiatric: He has a normal mood and affect. His behavior is normal.  Nursing note and vitals reviewed.    ED Treatments / Results  Labs (all labs ordered are listed, but only abnormal results are displayed) Labs Reviewed  RAPID STREP SCREEN (NOT AT [facility])  CULTURE, GROUP A STREP [facility]    EKG  EKG Interpretation None       Radiology Dg Chest 2 View  Result Date: 02/11/2017 CLINICAL DATA:  Cough, congestion, fever and body aches for 3 days. EXAM: CHEST  2 VIEW COMPARISON:  PA and lateral chest 04/08/2016 and 07/07/2015. FINDINGS: The lungs are clear. Heart size is normal. No pneumothorax or pleural  fluid. No bony abnormality. IMPRESSION: Normal chest. Electronically Signed   By: Inge Rise M.D.   On: 02/11/2017 16:47    Procedures Procedures (including critical care time)  Medications Ordered in ED Medications  acetaminophen (TYLENOL) tablet 650 mg (650 mg Oral Given 02/11/17 1544)  ibuprofen (ADVIL,MOTRIN) tablet 800 mg (800 mg Oral Given 02/11/17 1817)     Initial Impression / Assessment and Plan / ED Course  I have reviewed the triage vital signs and the nursing notes.  Pertinent labs & imaging results that were available during my care of the patient were reviewed by me and considered in my medical decision making (see chart for details).    This is a 42 y.o. Male presents to the emergency department complaining of flulike symptoms since yesterday.  Patient reports his symptoms began with fever, chills, sneezing and nasal congestion.  He  reports associated coughing, sore throat, headache and postnasal drip.  He did not receive his flu shot this year.  He reports taking NyQuil early this morning with some relief of his symptoms.  On exam the patient is nontoxic-appearing.  On arrival his temperature is 101.  This improved with Tylenol as did his heart rate.  His lungs are clear to auscultation bilaterally.  No increased work of breathing.  He has no meningeal signs.  He has evidence of upper respiratory infection symptoms.  His chest x-ray is clear.  Rapid strep is negative.  Patient with influenza-like illness.  I discussed options with Tamiflu and its expected efficacy and its side effects.  Patient would like treatment with Tamiflu today.  We will also discharged with prescriptions for Tessalon Perles, Flonase and naproxen.  Discussed methods to help with his symptoms of an influenza-like illness.  I encouraged him to obtain a flu shot next season.  Return precautions discussed. I advised the patient to follow-up with their primary care provider this week. I advised the patient to return to the emergency department with new or worsening symptoms or new concerns. The patient verbalized understanding and agreement with plan.     Final Clinical Impressions(s) / ED Diagnoses   Final diagnoses:  Influenza-like illness    ED Discharge Orders        Ordered    naproxen (NAPROSYN) 375 MG tablet  2 times daily with meals     02/11/17 1816    fluticasone (FLONASE) 50 MCG/ACT nasal spray  Daily     02/11/17 1816    benzonatate (TESSALON) 100 MG capsule  3 times daily PRN     02/11/17 1816    oseltamivir (TAMIFLU) 75 MG capsule  Every 12 hours     02/11/17 1816       Waynetta Pean, PA-C 02/11/17 1831    Orlie Dakin, MD 02/12/17 [phone]

## 2017-02-11 NOTE — ED Triage Notes (Signed)
Cough, congestion, fever, body aches x 3 days.

## 2017-02-13 ENCOUNTER — Telehealth: Payer: Self-pay | Admitting: Family Medicine

## 2017-02-13 LAB — CULTURE, GROUP A STREP (THRC)

## 2017-02-13 NOTE — Telephone Encounter (Signed)
Was diagnosed with the flu.  His note said he could return to work with no fever for 24 hrs. He is still running a fever and still congested. He is requesting a work note stating RTW on Monday Feb 4.  Please advise

## 2017-02-14 NOTE — Telephone Encounter (Signed)
Dear Derrick Scott Team He was seen on the 26th in ED. If he is still running a fever her will need to be seen for that note to be written THANKS! [name]

## 2017-02-15 NOTE — Telephone Encounter (Signed)
Pt has an appt with Dr. Andria Frames tomorrow 02/16/2017. Ottis Stain, CMA

## 2017-02-16 ENCOUNTER — Other Ambulatory Visit: Payer: Self-pay

## 2017-02-16 ENCOUNTER — Ambulatory Visit: Payer: BC Managed Care – PPO | Admitting: Family Medicine

## 2017-02-16 ENCOUNTER — Encounter: Payer: Self-pay | Admitting: Family Medicine

## 2017-02-16 DIAGNOSIS — J111 Influenza due to unidentified influenza virus with other respiratory manifestations: Secondary | ICD-10-CM | POA: Insufficient documentation

## 2017-02-16 DIAGNOSIS — R69 Illness, unspecified: Secondary | ICD-10-CM | POA: Diagnosis not present

## 2017-02-16 NOTE — Patient Instructions (Signed)
Yes, the flu is bad.  You are recovering normally - slow.   Get a flu shot next year.

## 2017-02-16 NOTE — Assessment & Plan Note (Signed)
Recovering slowly but uneventfully.

## 2017-02-16 NOTE — Progress Notes (Signed)
   Subjective:    Patient ID: Derrick Scott, male    DOB: October 10, 1975, 42 y.o.   MRN: [id number]  HPI FU influenza.  Began 6 days ago.  Rx with tamiflu No fever.  Still major cough and myalgias.  No SOB. Needs work note    Review of Systems     Objective:   Physical Exam Lungs clear Cardiac RRR without m or g       Assessment & Plan:

## 2017-03-01 ENCOUNTER — Ambulatory Visit (INDEPENDENT_AMBULATORY_CARE_PROVIDER_SITE_OTHER): Payer: BC Managed Care – PPO | Admitting: Family Medicine

## 2017-03-01 ENCOUNTER — Encounter: Payer: Self-pay | Admitting: Family Medicine

## 2017-03-01 VITALS — BP 123/85 | Ht 68.0 in | Wt 279.0 lb

## 2017-03-01 DIAGNOSIS — M25562 Pain in left knee: Secondary | ICD-10-CM

## 2017-03-01 MED ORDER — MELOXICAM 15 MG PO TABS: ORAL_TABLET | ORAL | 1 refills | Status: DC

## 2017-03-01 NOTE — Progress Notes (Signed)
Chief complaint: Left knee pain x 1 week  History of present illness: Derrick Scott is a 42 year old male who presents to the sports medicine office today with chief complaint of left knee pain.  He reports that symptoms have been ongoing for approximately 1 week now.  He does not report of any specific inciting incident, trauma, or injury to explain the pain.  He is a Microbiologist at [employer].  He reports that he was actually out of work for the last week recovering from the flu.  He reports that he has been mainly lounging around at home resting and recovering.  He reports noticing over the course of the last week intermittent pain in the left knee.  He points to the medial parapatellar region of the left knee as to where he feels the pain.  He reports any type of bending, stooping, crawling, going up, and going down stairs are aggravating factors.  He reports rest is only alleviating factor.  He reports that he took 1 tablet of ibuprofen 800 mg this morning, otherwise no other pain medications.  Currently, he rates pain as a 5/10.  Symptoms have been the same over the last week.  He has not iced or done any type of bracing.  He does not report of any previous left knee injury.  He reports on his right knee he did have arthroscopic repair for degenerative medial meniscal tearing by Dr. Berenice Primas with [facility] back in feb 2014.  He reports that he feels that symptoms are similar on the left side.  He does not report of any numbness, tingling, or burning paresthesias.  He does report of intermittent swelling in his left knee.  He does report of painful popping, occasional locking, and occasional symptoms of instability.  No fevers, chills, night sweats, or any unintentional weight loss.  Review of systems:  As stated above  His past medical history, surgical history, family history, and social history obtained and reviewed.  Past medical history notable for BPPV and obesity,  surgical history notable for right knee arthroscopy in 2014 and corneal transplant in 2011, does not report of any current tobacco use, family history notable for asthma, diabetes, osteoarthritis, and cancer  Physical exam: Vital signs are reviewed and are documented in the chart Gen.: Alert, oriented, appears stated age, in no apparent distress HEENT: Moist oral mucosa Respiratory: Normal respirations, able to speak in full sentences Cardiac: Regular rate, distal pulses 2+ Integumentary: No rashes on visible skin:  Neurologic: Strength 5/5 with hip flexion, hip abduction, knee flexion, knee extension, ankle dorsiflexion, ankle plantarflexion bilaterally, sensation 2+ in bilateral lower extremities Psych: Normal affect, mood is described as good Musculoskeletal: Inspection of left knee reveals no obvious deformity or muscle atrophy, has trace suprapatellar effusion noted, no warmth, erythema, or ecchymosis noted otherwise, he is tender palpation along the medial parapatellar region, no tenderness over the quadriceps tendon, patellar tendon, medial joint line, or lateral joint line, Lachman, anterior drawer, and Lever sign negative, obese and varus stress testing negative, McMurray positive for pain negative for crepitus, does have pain with single leg stand, hopping, and squatting, range of motion in his left knee is from 0 degrees to 110 degrees, his range of motion in his right knee is from 0 degrees to 120 degrees, does not walk with an antalgic gait  Limited musculoskeletal ultrasound was performed in the office today of his left knee -He does have slight supraphysiologic suprapatellar effusion noted with hypoechoic changes  seen in the suprapatellar pouch, normal quadriceps tendon without any evidence of tendinopathy -He does have hypoechoic changes seen along the lateral joint line, with cortical irregularity along the lateral joint line and hypoechoic changes seen tracking proximally in the  left knee, normal LCL tendon -He also has hypoechoic changes seen along the medial joint line, with cortical irregularity, bulging of the anterior medial meniscus, and tracking of hypoechoic fluid proximally -He does have normal patellar tendon, no evidence of infrapatellar bursitis, no hypoechoic changes seen or increase in patellar tendon size  Impression: Left knee supraphysiologic suprapatellar effusion, with medial meniscal bulging and fluid both along the medial and lateral joint lines tracking proximally, concerning for degenerative meniscal pathology  Ultrasound performed and interpreted by: Mort Sawyers, MD  Assessment and plan: 1. Left knee pain, with ultrasound imaging concerning for degenerative meniscal pathology 2.  History of right knee arthroscopic repair for medial meniscal tear by Dr. Berenice Primas in February 2014 3.  Obesity  Plan: I discussed with Derrick Scott today my concern for degenerative meniscal pathology involving his left knee, given his physical exam and ultrasound findings.  Discussed next step is to obtain an x-ray of his left knee.  Will order for 4 views including AP, lateral, sunrise, and [place].  Discussed having him start on anti-inflammatory medication, will have him start on meloxicam 15 mg daily, to take daily for 2 weeks with food, then daily as needed thereafterwards.  Discussed to discontinue ibuprofen. I also discussed not take any other ibuprofen, Motrin, or Aleve products while on this medication.  Discussed to use his hinged knee brace that he has at home for comfort and stability.  Discussed ice, compression, elevation, avoiding of deep bending, stooping, or squatting.  I discussed gentle range of motion and strengthening exercises for his quadriceps, hamstrings, glutes, and hip muscles.  Will plan to have him return in 4 weeks or sooner as needed.  Greater than 50% of 25 minutes was spend in direct counseling and coordination of care for the above  diagnosis.   Mort Sawyers, M.D. [facility]

## 2017-03-07 ENCOUNTER — Other Ambulatory Visit: Payer: Self-pay | Admitting: *Deleted

## 2017-03-07 MED ORDER — TRAMADOL HCL 50 MG PO TABS: ORAL_TABLET | ORAL | 0 refills | Status: DC

## 2017-03-27 ENCOUNTER — Ambulatory Visit
Admission: RE | Admit: 2017-03-27 | Discharge: 2017-03-27 | Disposition: A | Discharge status: Home / Self Care | Payer: BC Managed Care – PPO | Source: Ambulatory Visit | Attending: Family Medicine | Admitting: Family Medicine

## 2017-03-27 DIAGNOSIS — M25562 Pain in left knee: Secondary | ICD-10-CM

## 2017-03-29 ENCOUNTER — Ambulatory Visit (INDEPENDENT_AMBULATORY_CARE_PROVIDER_SITE_OTHER): Payer: BC Managed Care – PPO | Admitting: Family Medicine

## 2017-03-29 ENCOUNTER — Encounter: Payer: Self-pay | Admitting: Family Medicine

## 2017-03-29 DIAGNOSIS — M25562 Pain in left knee: Secondary | ICD-10-CM | POA: Diagnosis not present

## 2017-03-29 NOTE — Progress Notes (Addendum)
Chief complaint: Follow-up of left knee pain x 5 weeks  History of present illness: Derrick Scott is a 42 year old male who presents to sports medicine office today for follow-up of left knee pain.  I last saw him for initial presentation of left knee pain 4 weeks ago.  At that time, symptoms had been present for 1 week.  On history, exam, and ultrasound, there was concern for degenerative meniscal pathology.  XR was ordered and performed after last office visit, which did show patellofemoral osteoarthritic changes.  He was prescribed meloxicam 15 mg daily, discussed to use his hinged knee brace that he had at home for comfort.  Also had discussed ice, compression, elevation, avoiding deep bending, stooping, or squatting.  I also gave him a home exercise program for patellofemoral pain syndrome.  Unfortunately, he does not report of any interval improvement in his symptoms today. He reports despite the meloxicam, bracing, and HEP, he still has medial joint line pain.  He reports pain with deep bending, stooping, or squatting.  He is a Microbiologist at [employer].  Unortunately, he reports that the physical demands that are required for him at work are causing flareups of pain and he is unable to perform his job duty in his current condition.  He reports of painful popping, locking, catching, and symptoms of giving way.  No interval injury or trauma.  No numbness, tingling, or burning paresthesias.  Review of systems:  As stated above  Interval past medical history, surgical history, family history, and social history obtained and unchanged. Past medical history notable for BPPV and obesity, surgical history notable for right knee arthroscopy in 2014 and corneal transplant in 2011, does not report of any current tobacco use, family history notable for asthma, diabetes, osteoarthritis, and cancer  Physical exam: Vital signs are reviewed and are documented in the chart Gen.: Alert,  oriented, appears stated age, in no apparent distress HEENT: Moist oral mucosa Respiratory: Normal respirations, able to speak in full sentences Cardiac: Regular rate, distal pulses 2+ Integumentary: No rashes on visible skin:  Neurologic: Strength 5/5, sensation 2+ in bilateral lower extremities, specifically he does have intact strength with left knee extension and flexion Psych: Normal affect, mood is described as good Musculoskeletal: Inspection of left knee reveals no obvious deformity or muscle atrophy, has 1+ suprapatellar effusion noted, no warmth, erythema, or ecchymosis noted otherwise, he continues to have tenderness to palpation along the medial parapatellar region, is also tender today along the medial joint line, no tenderness along the quadriceps tendon, patellar tendon, or lateral joint line, no signs of ligamentous instability as Lachman, anterior drawer, Lever sign, valgus, varus stress testing negative, McMurray positive for pain, negative for crepitus, range of motion is the same today, from 0 degrees to 110 degrees  Assessment and plan: 1. Left knee pain, with previous ultrasound imaging concerning for degenerative meniscal pathology 2.  History of right knee arthroscopic repair for medial meniscal tear by Dr. Berenice Primas in February 2014 3.  Obesity  Plan: At this point in time, despite anti-inflammatory medications, bracing, and home exercise program, he is not having any interval improvement in his symptoms.  Now, he reports that the pain and the physical demands required for him at work are preventing him to do his job properly.  At this point in time, I do feel that next best step would be to obtain a MRI of his left knee.  This will be done without contrast to rule out meniscal  pathology. If he does have meniscal tearing, he is understandable and willing to undergo any surgical intervention for him to perform his full job duties. I would like to hold off on cortisone injection  until I get the MRI results.  In the interim, I discussed for work status that I will write him out of work until MRI results have been received by me.  Future work status will be determined based on MRI results.  Will send in another prescription for meloxicam 15 mg daily.  Discussed to continue using his hinged knee brace, discussed ice, elevation, and continue with home exercise program.  Will call him after MRI results, with next step to be determined based on that.   Mort Sawyers, M.D. [facility]

## 2017-04-07 ENCOUNTER — Other Ambulatory Visit: Payer: Self-pay

## 2017-04-07 ENCOUNTER — Ambulatory Visit
Admission: RE | Admit: 2017-04-07 | Discharge: 2017-04-07 | Disposition: A | Discharge status: Home / Self Care | Payer: BC Managed Care – PPO | Source: Ambulatory Visit | Attending: Family Medicine | Admitting: Family Medicine

## 2017-04-07 ENCOUNTER — Ambulatory Visit (INDEPENDENT_AMBULATORY_CARE_PROVIDER_SITE_OTHER): Payer: BC Managed Care – PPO | Admitting: Internal Medicine

## 2017-04-07 ENCOUNTER — Encounter: Payer: Self-pay | Admitting: Internal Medicine

## 2017-04-07 VITALS — BP 118/80 | HR 101 | Temp 98.9°F | Ht 68.0 in | Wt 281.0 lb

## 2017-04-07 DIAGNOSIS — R059 Cough, unspecified: Secondary | ICD-10-CM

## 2017-04-07 DIAGNOSIS — R05 Cough: Secondary | ICD-10-CM

## 2017-04-07 MED ORDER — GUAIFENESIN-CODEINE 100-10 MG/5ML PO SYRP: 5.0000 mL | ORAL_SOLUTION | Freq: Every day | ORAL | 0 refills | Status: DC

## 2017-04-07 NOTE — Patient Instructions (Signed)
I am sorry that you are still coughing. We will check a chest x-ray to make sure there is not a pneumonia that needs further treatment. It may be a lingering cough. Sometimes after pneumonia a cough can last for up to 8 weeks. I have sent in more cough syrup. We will call you with the x-ray results.

## 2017-04-10 ENCOUNTER — Inpatient Hospital Stay: Admission: RE | Admit: 2017-04-10 | Payer: Self-pay | Source: Ambulatory Visit

## 2017-04-10 ENCOUNTER — Telehealth: Payer: Self-pay

## 2017-04-10 NOTE — Telephone Encounter (Signed)
Spoke to pt. He will schedule an appt with Dr. Nori Riis if cough continues. Ottis Stain, CMA

## 2017-04-10 NOTE — Progress Notes (Signed)
   Subjective:    Derrick Scott - 42 y.o. male MRN [id number]  Date of birth: 05-05-1975  HPI  Derrick Scott is here for cough.  Cough: Has been present for about 4-5 weeks. Reports that he was diagnosed with CAP after having the flu. Seen at an Urgent Care and prescribed an antibiotic that he does not recall the name of. Cough has been present since that urgent care visit but has been improving. Denies fevers, nasal congestion, sore throat, headaches, and body aches. Denies respiratory distress, chest pain, lower extremity edema and hemoptysis. Was previously taking Robitussin AC but ran out of this medication. Currently using Tessalon which does not seem to be helping the cough.   -  reports that he has never smoked. He has never used smokeless tobacco. - Review of Systems: Per HPI. - Past Medical History: Patient Active Problem List   Diagnosis Date Noted  . Influenza-like illness 02/16/2017  . Pes planus 02/07/2017  . Redness of eye, right 10/06/2016  . Ankle impingement syndrome, left 07/08/2016  . Well adult exam 05/19/2016  . Laryngopharyngeal reflux (LPR) 07/24/2015  . Post corneal transplant 07/25/2014  . 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 Adrenal mass, right ([place]) 05/22/2013  . Sleep apnea 07/31/2009  . OBESITY, NOS 03/16/2006   - Medications: reviewed and updated   Objective:   Physical Exam BP 118/80   Pulse (!) 101   Temp 98.9 F (37.2 C) (Oral)   Ht 5\' 8"  (1.727 m)   Wt 281 lb (127.5 kg)   SpO2 95%   BMI 42.73 kg/m  Gen: NAD, alert, cooperative with exam, well-appearing HEENT: NCAT, PERRL, clear conjunctiva, oropharynx clear, supple neck CV: RRR, good S1/S2, no murmur, no LE edema Resp: CTABL, no wheezes, non-labored, good air movement throughout  Skin: no rashes, normal turgor  Neuro: no gross deficits.  Psych: good insight, alert and oriented   Assessment & Plan:   1. Cough Most suspicious for lingering  post-infectious cough given that it is reportedly getting better and he has no associated symptoms. However given prolonged time course of >4 weeks will obtain CXR to evaluate for incompletely treated CAP. While patient is mildly tachycardic, he has no signs or symptoms of VTE. Will give refill of Robitussin AC. Discussed return precautions.  - DG Chest 2 View; Future - guaiFENesin-codeine (ROBITUSSIN AC) 100-10 MG/5ML syrup; Take 5 mLs by mouth at bedtime.  Dispense: 120 mL; Refill: 0   Phill Myron, D.O. 04/10/2017, 2:47 PM PGY-3, [place]

## 2017-04-10 NOTE — Telephone Encounter (Signed)
-----   Message from Nicolette Bang, DO sent at 04/10/2017  9:15 AM EDT ----- Please call patient to let him know that chest x-ray was normal without signs of pneumonia. If cough continues, he should follow up with PCP.   Phill Myron, D.O. 04/10/2017, 9:15 AM PGY-3, [place]

## 2017-04-11 ENCOUNTER — Telehealth: Payer: Self-pay | Admitting: Family Medicine

## 2017-04-11 NOTE — Telephone Encounter (Signed)
Derrick Scott Evidently Derrick Scott was seeing Dr. Purnell Shoemaker wanted MRI and it was not approved. Pamala Hurry called me and wanted usto contact Derrick Scott and tell him next step? Do you know anything about his? Do youknow why MRI was denied? Text me or call me [facility]! [name]

## 2017-04-13 NOTE — Telephone Encounter (Signed)
Patient to see:  Dr Berenice Primas at [facility] on: Tuesday, April 2nd at [address] [place]  Patient is informed.

## 2017-04-18 ENCOUNTER — Other Ambulatory Visit: Payer: Self-pay | Admitting: *Deleted

## 2017-04-18 DIAGNOSIS — M25562 Pain in left knee: Secondary | ICD-10-CM

## 2017-04-27 ENCOUNTER — Encounter (INDEPENDENT_AMBULATORY_CARE_PROVIDER_SITE_OTHER): Payer: Self-pay | Admitting: Orthopedic Surgery

## 2017-04-27 ENCOUNTER — Ambulatory Visit (INDEPENDENT_AMBULATORY_CARE_PROVIDER_SITE_OTHER): Payer: BC Managed Care – PPO | Admitting: Orthopedic Surgery

## 2017-04-27 DIAGNOSIS — M25562 Pain in left knee: Secondary | ICD-10-CM | POA: Diagnosis not present

## 2017-04-27 MED ORDER — METHYLPREDNISOLONE ACETATE 40 MG/ML IJ SUSP
40.0000 mg | INTRAMUSCULAR | Status: AC | PRN
  Administered 2017-04-27: 40 mg via INTRA_ARTICULAR

## 2017-04-27 MED ORDER — LIDOCAINE HCL 1 % IJ SOLN
5.0000 mL | INTRAMUSCULAR | Status: AC | PRN
  Administered 2017-04-27: 5 mL

## 2017-04-27 NOTE — Progress Notes (Signed)
Office Visit Note   Patient: Derrick Scott           Date of Birth: 1975-11-27           MRN: [id number] Visit Date: 04/27/2017              Requested by: Dickie La, MD [address], [place] [address] PCP: Dickie La, MD  Chief Complaint  Patient presents with  . Left Knee - Pain      HPI: Patient is a 42 year old gentleman who presents for initial evaluation for acute pain left knee.  Patient complains of popping and locking denies any specific injury denies previous history of surgery he states that this is been going on for 2 months.  He states that it is worse with walking and standing he has tried ice elevation meloxicam.  Radiographs have been obtained.  Assessment & Plan: Visit Diagnoses:  1. Acute pain of left knee     Plan: The knee was injected patient will avoid sports follow-up in 4 weeks for reevaluation.  Discussed that if we still have mechanical symptoms of follow-up we could obtain an MRI scan.  Follow-Up Instructions: Return in about 1 month (around 05/25/2017).   Ortho Exam  Patient is alert, oriented, no adenopathy, well-dressed, normal affect, normal respiratory effort. Examination patient has an antalgic gait.  There is mild crepitation with range of motion of the knee collaterals and cruciates are stable.  He has tenderness to palpation of the medial joint line as well as the patellofemoral joint there is crepitation of the patellofemoral joint with range of motion.  Radiographs of the left knee were reviewed which shows good articular joint space with no significant abnormalities.  Imaging: No results found. No images are attached to the encounter.  Labs: Lab Results  Component Value Date   REPTSTATUS 02/13/2017 FINAL 02/11/2017   CULT FEW STREPTOCOCCUS,BETA HEMOLYTIC NOT GROUP A 02/11/2017    @LABSALLVALUES (HGBA1)@  There is no height or weight on file to calculate BMI.  Orders:  No orders of the defined types were  placed in this encounter.  No orders of the defined types were placed in this encounter.    Procedures: Large Joint Inj: L knee on 04/27/2017 11:49 AM Indications: pain and diagnostic evaluation Details: 22 G 1.5 in needle, anteromedial approach  Arthrogram: No  Medications: 5 mL lidocaine 1 %; 40 mg methylPREDNISolone acetate 40 MG/ML Outcome: tolerated well, no immediate complications Procedure, treatment alternatives, risks and benefits explained, specific risks discussed. Consent was given by the patient. Immediately prior to procedure a time out was called to verify the correct patient, procedure, equipment, support staff and site/side marked as required. Patient was prepped and draped in the usual sterile fashion.      Clinical Data: No additional findings.  ROS:  All other systems negative, except as noted in the HPI. Review of Systems  Objective: Vital Signs: There were no vitals taken for this visit.  Specialty Comments:  No specialty comments available.  PMFS History: Patient Active Problem List   Diagnosis Date Noted  . Influenza-like illness 02/16/2017  . Pes planus 02/07/2017  . Redness of eye, right 10/06/2016  . Ankle impingement syndrome, left 07/08/2016  . Well adult exam 05/19/2016  . Laryngopharyngeal reflux (LPR) 07/24/2015  . Post corneal transplant 07/25/2014  . 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 Adrenal mass, right ([place]) 05/22/2013  . Sleep apnea 07/31/2009  . OBESITY, NOS 03/16/2006  Past Medical History:  Diagnosis Date  . Medial meniscus tear 01/2012   right knee  . Obesity   . Pneumonia   . Renal disorder   . Vertigo, benign paroxysmal 2015    Family History  Problem Relation Age of Onset  . Asthma Mother   . Diabetes Father   . Cancer Father        prostate  . Osteoarthritis Sister   . Diabetes Sister     Past Surgical History:  Procedure Laterality Date  . CORNEAL TRANSPLANT  12/2009  . KNEE ARTHROSCOPY   02/20/2012   Procedure: ARTHROSCOPY KNEE;  Surgeon: Alta Corning, MD;  Location: [facility];  Service: Orthopedics;  Laterality: Right;  Chondroplasia patella femoral joint, medial meniscal debriedment   Social History   Occupational History  . Not on file  Tobacco Use  . Smoking status: Never Smoker  . Smokeless tobacco: Never Used  Substance and Sexual Activity  . Alcohol use: No  . Drug use: No  . Sexual activity: Not on file

## 2017-05-04 ENCOUNTER — Ambulatory Visit (INDEPENDENT_AMBULATORY_CARE_PROVIDER_SITE_OTHER): Payer: BC Managed Care – PPO | Admitting: Orthopedic Surgery

## 2017-05-04 ENCOUNTER — Encounter (INDEPENDENT_AMBULATORY_CARE_PROVIDER_SITE_OTHER): Payer: Self-pay | Admitting: Orthopedic Surgery

## 2017-05-04 VITALS — Ht 68.0 in | Wt 281.0 lb

## 2017-05-04 DIAGNOSIS — M25562 Pain in left knee: Secondary | ICD-10-CM

## 2017-05-04 DIAGNOSIS — G8929 Other chronic pain: Secondary | ICD-10-CM | POA: Diagnosis not present

## 2017-05-04 DIAGNOSIS — S83242D Other tear of medial meniscus, current injury, left knee, subsequent encounter: Secondary | ICD-10-CM | POA: Diagnosis not present

## 2017-05-04 NOTE — Progress Notes (Signed)
Office Visit Note   Patient: Derrick Scott           Date of Birth: [birth year]           MRN: [id number] Visit Date: 05/04/2017              Requested by: Dickie La, MD [address], [place] [address] PCP: Dickie La, MD  Chief Complaint  Patient presents with  . Left Knee - Pain    S/p cortisone injection 04/27/17      HPI: Patient is a 42 year old gentleman who is been having mechanical symptoms medial joint line left knee.  Patient complains of catching popping locking when he twists and turns and symptoms along the medial joint line he complains of swelling in the popliteal fossa.  He states that the steroid injection seemed to make his symptoms worse.  Assessment & Plan: Visit Diagnoses:  1. Other tear of medial meniscus, current injury, left knee, subsequent encounter     Plan: We will set up an MRI scan to further evaluate the medial meniscus left knee follow-up after this is obtained and anticipate patient may require arthroscopic debridement.  Follow-Up Instructions: Return if symptoms worsen or fail to improve.   Ortho Exam  Patient is alert, oriented, no adenopathy, well-dressed, normal affect, normal respiratory effort. Examination patient has an antalgic gait he does have a mild effusion collaterals and cruciates are stable he is point tender to palpation along the medial joint line flexion internal rotation is painful.  Imaging: No results found. No images are attached to the encounter.  Labs: Lab Results  Component Value Date   REPTSTATUS 02/13/2017 FINAL 02/11/2017   CULT FEW STREPTOCOCCUS,BETA HEMOLYTIC NOT GROUP A 02/11/2017    @LABSALLVALUES (HGBA1)@  Body mass index is 42.73 kg/m.  Orders:  No orders of the defined types were placed in this encounter.  No orders of the defined types were placed in this encounter.    Procedures: No procedures performed  Clinical Data: No additional findings.  ROS:  All  other systems negative, except as noted in the HPI. Review of Systems  Objective: Vital Signs: Ht 5\' 8"  (1.727 m)   Wt 281 lb (127.5 kg)   BMI 42.73 kg/m   Specialty Comments:  No specialty comments available.  PMFS History: Patient Active Problem List   Diagnosis Date Noted  . Other tear of medial meniscus, current injury, left knee, subsequent encounter 05/04/2017  . Influenza-like illness 02/16/2017  . Pes planus 02/07/2017  . Redness of eye, right 10/06/2016  . Ankle impingement syndrome, left 07/08/2016  . Well adult exam 05/19/2016  . Laryngopharyngeal reflux (LPR) 07/24/2015  . Post corneal transplant 07/25/2014  . 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 Adrenal mass, right ([place]) 05/22/2013  . Sleep apnea 07/31/2009  . OBESITY, NOS 03/16/2006   Past Medical History:  Diagnosis Date  . Medial meniscus tear 01/2012   right knee  . Obesity   . Pneumonia   . Renal disorder   . Vertigo, benign paroxysmal 2015    Family History  Problem Relation Age of Onset  . Asthma Mother   . Diabetes Father   . Cancer Father        prostate  . Osteoarthritis Sister   . Diabetes Sister     Past Surgical History:  Procedure Laterality Date  . CORNEAL TRANSPLANT  12/2009  . KNEE ARTHROSCOPY  02/20/2012   Procedure: ARTHROSCOPY KNEE;  Surgeon:  Alta Corning, MD;  Location: [facility];  Service: Orthopedics;  Laterality: Right;  Chondroplasia patella femoral joint, medial meniscal debriedment   Social History   Occupational History  . Not on file  Tobacco Use  . Smoking status: Never Smoker  . Smokeless tobacco: Never Used  Substance and Sexual Activity  . Alcohol use: No  . Drug use: No  . Sexual activity: Not on file

## 2017-05-12 ENCOUNTER — Ambulatory Visit
Admission: RE | Admit: 2017-05-12 | Discharge: 2017-05-12 | Disposition: A | Discharge status: Home / Self Care | Payer: BC Managed Care – PPO | Source: Ambulatory Visit | Attending: Orthopedic Surgery | Admitting: Orthopedic Surgery

## 2017-05-12 DIAGNOSIS — G8929 Other chronic pain: Secondary | ICD-10-CM

## 2017-05-12 DIAGNOSIS — M25562 Pain in left knee: Principal | ICD-10-CM

## 2017-05-23 ENCOUNTER — Ambulatory Visit (INDEPENDENT_AMBULATORY_CARE_PROVIDER_SITE_OTHER): Payer: BC Managed Care – PPO | Admitting: Orthopedic Surgery

## 2017-05-23 ENCOUNTER — Encounter (INDEPENDENT_AMBULATORY_CARE_PROVIDER_SITE_OTHER): Payer: Self-pay | Admitting: Orthopedic Surgery

## 2017-05-23 DIAGNOSIS — M25562 Pain in left knee: Secondary | ICD-10-CM

## 2017-05-23 NOTE — Progress Notes (Signed)
Office Visit Note   Patient: Derrick Scott           Date of Birth: [birth year]           MRN: [id number] Visit Date: 05/23/2017              Requested by: Dickie La, MD [address], [place] [address] PCP: Dickie La, MD  Chief Complaint  Patient presents with  . Left Knee - Follow-up  . Results    MRI      HPI: Patient is a 42 year old gentleman with left knee pain with symptoms of giving way.  Patient states he does have swelling which prevents full flexion of his knee.  Patient is status post MRI scan.  Assessment & Plan: Visit Diagnoses:  1. Acute pain of left knee     Plan: Patient will be set up with home outpatient physical therapy for physical therapy of the left knee.  Discussed that without meniscal pathology or significant osteochondral defect the chance of improvement with arthroscopic intervention is pretty small.  Feel his best results will be with physical therapy.  Orders were written for [facility] physical therapy.  He is given a note to be out of work for 4 weeks.  Patient states they do not have light duty work at work and he cannot return until he is full duty.  Follow-Up Instructions: Return in about 1 month (around 06/20/2017).   Ortho Exam  Patient is alert, oriented, no adenopathy, well-dressed, normal affect, normal respiratory effort. Examination patient has some mild swelling of the left knee with a mild effusion.  Medial lateral joint lines are nontender to palpation there is some mild crepitation with range of motion with tenderness in the patellofemoral joint.  Collaterals and cruciates are stable.  Review of the MRI scan shows an intact medial and lateral meniscus shows intact collaterals and cruciate ligaments does show some degenerative changes of the articular cartilage of the medial femoral condyle as well as along the patella.  Patient is asymptomatic to palpation along the medial joint line.  Imaging: No results  found. No images are attached to the encounter.  Labs: Lab Results  Component Value Date   REPTSTATUS 02/13/2017 FINAL 02/11/2017   CULT FEW STREPTOCOCCUS,BETA HEMOLYTIC NOT GROUP A 02/11/2017    No results found for: HGBA1C  There is no height or weight on file to calculate BMI.  Orders:  No orders of the defined types were placed in this encounter.  No orders of the defined types were placed in this encounter.    Procedures: No procedures performed  Clinical Data: No additional findings.  ROS:  All other systems negative, except as noted in the HPI. Review of Systems  Objective: Vital Signs: There were no vitals taken for this visit.  Specialty Comments:  No specialty comments available.  PMFS History: Patient Active Problem List   Diagnosis Date Noted  . Other tear of medial meniscus, current injury, left knee, subsequent encounter 05/04/2017  . Influenza-like illness 02/16/2017  . Pes planus 02/07/2017  . Redness of eye, right 10/06/2016  . Ankle impingement syndrome, left 07/08/2016  . Well adult exam 05/19/2016  . Laryngopharyngeal reflux (LPR) 07/24/2015  . Post corneal transplant 07/25/2014  . 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 Adrenal mass, right ([place]) 05/22/2013  . Sleep apnea 07/31/2009  . OBESITY, NOS 03/16/2006   Past Medical History:  Diagnosis Date  . Medial meniscus tear 01/2012  right knee  . Obesity   . Pneumonia   . Renal disorder   . Vertigo, benign paroxysmal 2015    Family History  Problem Relation Age of Onset  . Asthma Mother   . Diabetes Father   . Cancer Father        prostate  . Osteoarthritis Sister   . Diabetes Sister     Past Surgical History:  Procedure Laterality Date  . CORNEAL TRANSPLANT  12/2009  . KNEE ARTHROSCOPY  02/20/2012   Procedure: ARTHROSCOPY KNEE;  Surgeon: Alta Corning, MD;  Location: [facility];  Service: Orthopedics;  Laterality: Right;  Chondroplasia patella  femoral joint, medial meniscal debriedment   Social History   Occupational History  . Not on file  Tobacco Use  . Smoking status: Never Smoker  . Smokeless tobacco: Never Used  Substance and Sexual Activity  . Alcohol use: No  . Drug use: No  . Sexual activity: Not on file

## 2017-05-25 ENCOUNTER — Ambulatory Visit (INDEPENDENT_AMBULATORY_CARE_PROVIDER_SITE_OTHER): Payer: BC Managed Care – PPO | Admitting: Orthopedic Surgery

## 2017-05-29 ENCOUNTER — Telehealth: Payer: Self-pay | Admitting: *Deleted

## 2017-05-29 NOTE — Telephone Encounter (Signed)
Letter created and faxed.

## 2017-06-14 ENCOUNTER — Ambulatory Visit: Payer: BC Managed Care – PPO | Attending: Orthopedic Surgery | Admitting: Physical Therapy

## 2017-06-15 IMAGING — CR DG ANKLE COMPLETE 3+V*L*
3 series · 3 of 3 positions shown · non-contrast
Comparison: 02/05/2013

CLINICAL DATA: Left ankle twisting injuries 3 days ago as well as
last night. Soft tissue swelling laterally. Initial encounter.

EXAM:
LEFT ANKLE COMPLETE - 3+ VIEW

[x ankle ap left]
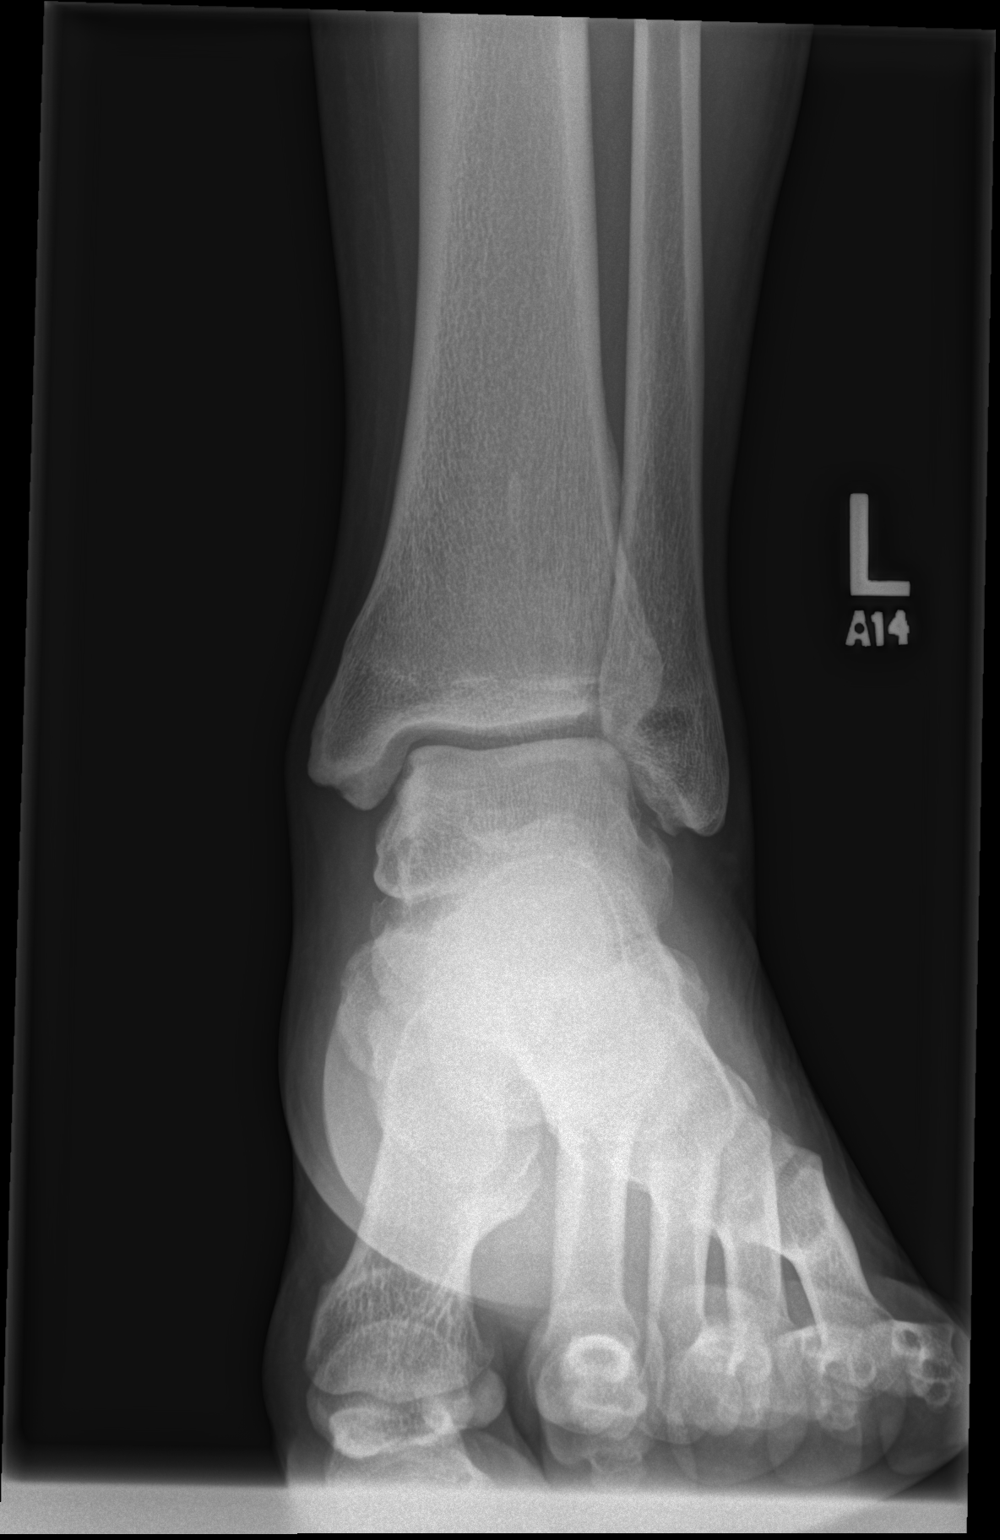

[x ankle obl left]
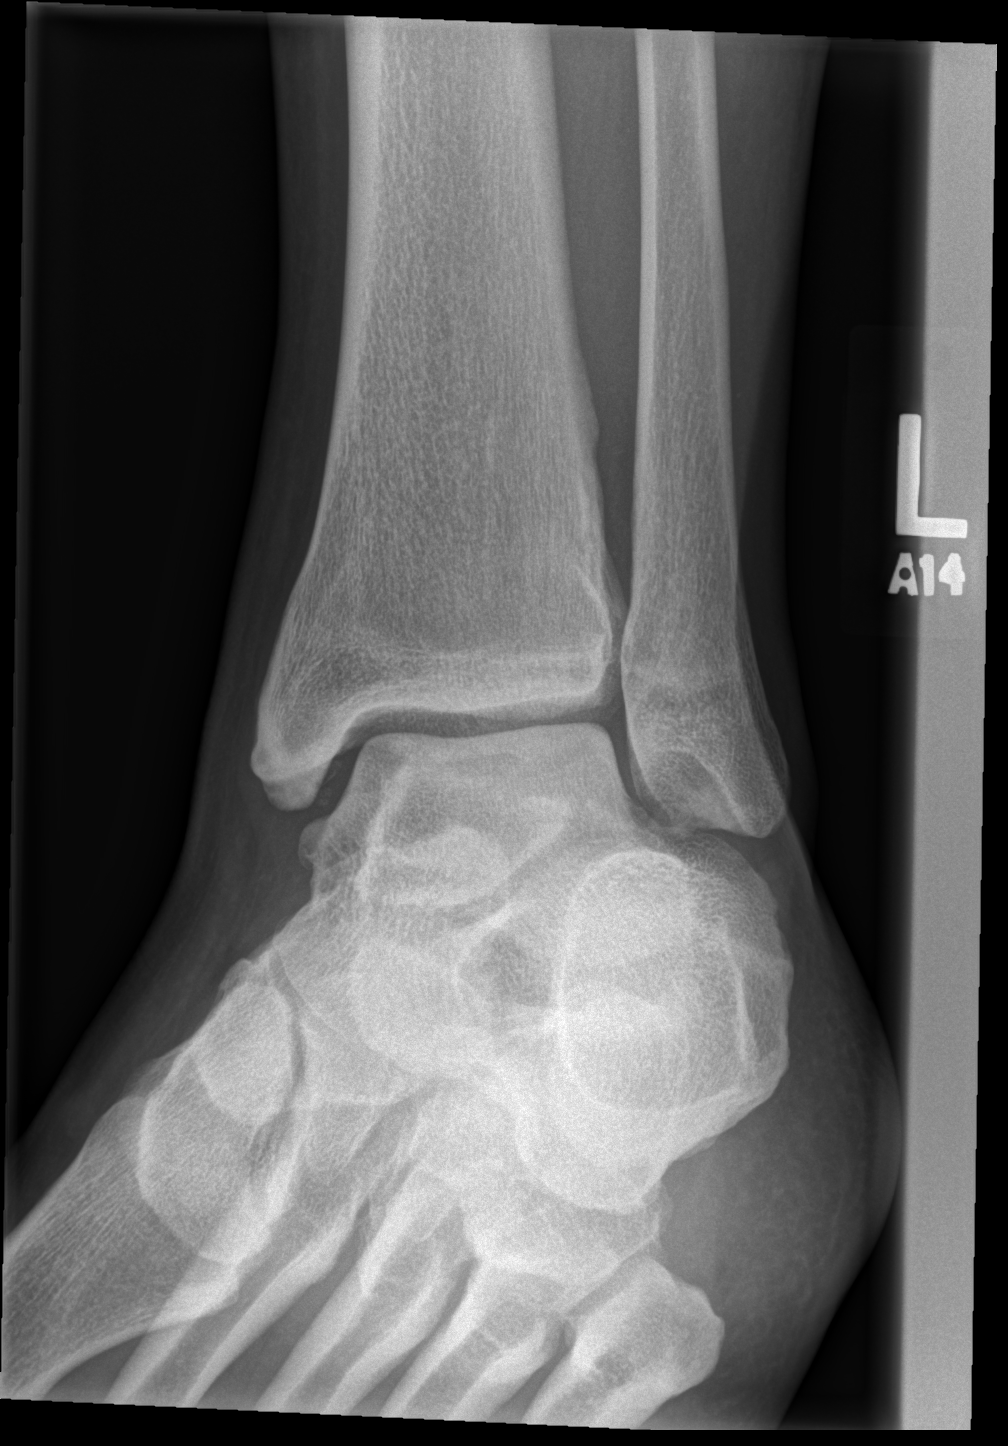

[x ankle lat left]
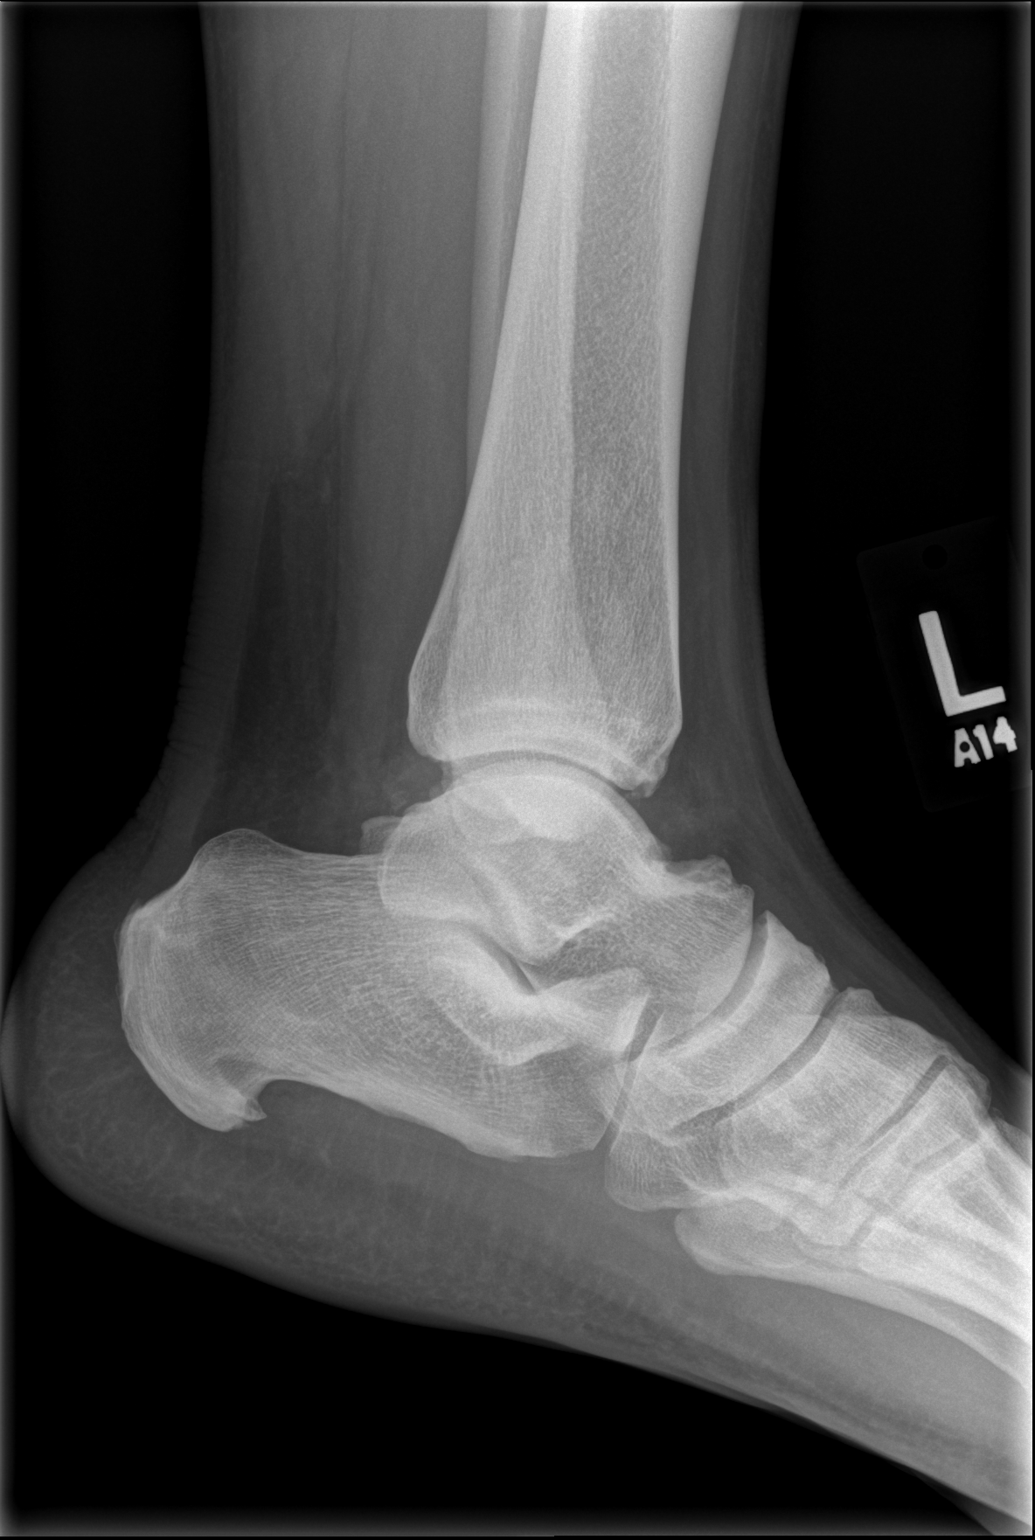

[3 of 3 positions shown; findings below may reference images not displayed]

FINDINGS: No acute fracture or dislocation is identified. Diminutive plantar
and posterior calcaneal enthesophytes are again seen. Mild dorsal
talonavicular spurring. Bone mineralization appears normal. No soft
tissue abnormality is identified.
IMPRESSION: No evidence of acute osseous abnormality.

## 2017-10-09 ENCOUNTER — Ambulatory Visit: Payer: BC Managed Care – PPO

## 2017-10-31 ENCOUNTER — Other Ambulatory Visit: Payer: Self-pay

## 2017-10-31 ENCOUNTER — Encounter (HOSPITAL_BASED_OUTPATIENT_CLINIC_OR_DEPARTMENT_OTHER): Payer: Self-pay

## 2017-10-31 ENCOUNTER — Emergency Department (HOSPITAL_BASED_OUTPATIENT_CLINIC_OR_DEPARTMENT_OTHER)
Admission: EM | Admit: 2017-10-31 | Discharge: 2017-10-31 | Disposition: A | Discharge status: Home / Self Care | Payer: BC Managed Care – PPO | Attending: Emergency Medicine | Admitting: Emergency Medicine

## 2017-10-31 ENCOUNTER — Emergency Department (HOSPITAL_BASED_OUTPATIENT_CLINIC_OR_DEPARTMENT_OTHER): Payer: BC Managed Care – PPO

## 2017-10-31 DIAGNOSIS — R072 Precordial pain: Secondary | ICD-10-CM | POA: Diagnosis not present

## 2017-10-31 DIAGNOSIS — R079 Chest pain, unspecified: Secondary | ICD-10-CM | POA: Diagnosis present

## 2017-10-31 LAB — COMPREHENSIVE METABOLIC PANEL
ALBUMIN: 4 g/dL (ref 3.5–5.0)
ALK PHOS: 68 U/L (ref 38–126)
ALT: 37 U/L (ref 0–44)
ANION GAP: 6 (ref 5–15)
AST: 31 U/L (ref 15–41)
BILIRUBIN TOTAL: 0.5 mg/dL (ref 0.3–1.2)
BUN: 14 mg/dL (ref 6–20)
CALCIUM: 8.8 mg/dL — AB (ref 8.9–10.3)
CO2: 27 mmol/L (ref 22–32)
CREATININE: 1.17 mg/dL (ref 0.61–1.24)
Chloride: 105 mmol/L (ref 98–111)
GFR calc Af Amer: >60 mL/min (ref >60)
GFR calc non Af Amer: >60 mL/min (ref >60)
Glucose, Bld: 104 mg/dL — ABNORMAL HIGH (ref 70–99)
Potassium: 3.5 mmol/L (ref 3.5–5.1)
Sodium: 138 mmol/L (ref 135–145)
TOTAL PROTEIN: 7.8 g/dL (ref 6.5–8.1)

## 2017-10-31 LAB — CBC
HEMATOCRIT: 44.9 % (ref 39.0–52.0)
Hemoglobin: 14.7 g/dL (ref 13.0–17.0)
MCH: 29.5 pg (ref 26.0–34.0)
MCHC: 32.7 g/dL (ref 30.0–36.0)
MCV: 90 fL (ref 80.0–100.0)
Platelets: 275 10*3/uL (ref 150–400)
RBC: 4.99 MIL/uL (ref 4.22–5.81)
RDW: 13.3 % (ref 11.5–15.5)
WBC: 8 10*3/uL (ref 4.0–10.5)
nRBC: 0 % (ref 0.0–0.2)

## 2017-10-31 LAB — TROPONIN I: Troponin I: <0.03 ng/mL (ref <0.03)

## 2017-10-31 MED ORDER — OMEPRAZOLE 20 MG PO CPDR: 20.0000 mg | DELAYED_RELEASE_CAPSULE | Freq: Every day | ORAL | 0 refills | Status: DC

## 2017-10-31 NOTE — ED Triage Notes (Signed)
C/o CP x 5 days-NAD-steady gait

## 2017-11-01 NOTE — ED Provider Notes (Signed)
[facility] EMERGENCY DEPARTMENT Provider Note   CSN: [id number] Arrival date & time: 10/31/17  1118     History   Chief Complaint Chief Complaint  Patient presents with  . Chest Pain    HPI Derrick Scott is a 42 y.o. male.  HPI Patient is a 42 year old male who reports intermittent sharp chest discomfort over the past 5 days.  He has no family history of early cardiac disease.  Denies fevers and chills.  No shortness of breath.  No active chest pain at this time.  He states the episodes last for a few seconds and then resolved.  They can occur with exertion or at rest.  They occur intermittently throughout the day without a clear-cut preceding event.  Not always associated with food.  No diaphoresis or shortness of breath with symptoms.  Denies nausea vomiting.  No recent illness.   Past Medical History:  Diagnosis Date  . Medial meniscus tear 01/2012   right knee  . Obesity   . Pneumonia   . Renal disorder   . Vertigo, benign paroxysmal 2015    Patient Active Problem List   Diagnosis Date Noted  . Other tear of medial meniscus, current injury, left knee, subsequent encounter 05/04/2017  . Influenza-like illness 02/16/2017  . Pes planus 02/07/2017  . Redness of eye, right 10/06/2016  . Ankle impingement syndrome, left 07/08/2016  . Well adult exam 05/19/2016  . Laryngopharyngeal reflux (LPR) 07/24/2015  . Post corneal transplant 07/25/2014  . 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 Adrenal mass, right ([place]) 05/22/2013  . Sleep apnea 07/31/2009  . OBESITY, NOS 03/16/2006    Past Surgical History:  Procedure Laterality Date  . CORNEAL TRANSPLANT  12/2009  . KNEE ARTHROSCOPY  02/20/2012   Procedure: ARTHROSCOPY KNEE;  Surgeon: Alta Corning, MD;  Location: [facility];  Service: Orthopedics;  Laterality: Right;  Chondroplasia patella femoral joint, medial meniscal debriedment        Home Medications    Prior to  Admission medications   Medication Sig Start Date End Date Taking? Authorizing Provider  meloxicam (MOBIC) 15 MG tablet Take 1 tablet daily with food. 03/01/17   Lake, Christoper P, MD  omeprazole (PRILOSEC) 20 MG capsule Take 1 capsule (20 mg total) by mouth daily. 10/31/17   Jola Schmidt, MD  traMADol Veatrice Bourbon) 50 MG tablet Take one tab BID to TID 03/07/17   [facility], Abbott Pao, MD    Family History Family History  Problem Relation Age of Onset  . Asthma Mother   . Diabetes Father   . Cancer Father        prostate  . Osteoarthritis Sister   . Diabetes Sister     Social History Social History   Tobacco Use  . Smoking status: Never Smoker  . Smokeless tobacco: Never Used  Substance Use Topics  . Alcohol use: No  . Drug use: No     Allergies   Patient has no known allergies.   Review of Systems Review of Systems  All other systems reviewed and are negative.    Physical Exam Updated Vital Signs BP 116/69 (BP Location: Right Arm)   Pulse 98   Temp 98.6 F (37 C) (Oral)   Resp 14   Ht 5\' 8"  (1.727 m)   Wt 127 kg   SpO2 100%   BMI 42.57 kg/m   Physical Exam  Constitutional: He is oriented to person, place, and time. He appears  well-developed and well-nourished.  HENT:  Head: Normocephalic and atraumatic.  Eyes: EOM are normal.  Neck: Normal range of motion.  Cardiovascular: Normal rate, regular rhythm, normal heart sounds and intact distal pulses.  Pulmonary/Chest: Effort normal and breath sounds normal. No respiratory distress.  Abdominal: Soft. He exhibits no distension. There is no tenderness.  Musculoskeletal: Normal range of motion.  Neurological: He is alert and oriented to person, place, and time.  Skin: Skin is warm and dry.  Psychiatric: He has a normal mood and affect. Judgment normal.  Nursing note and vitals reviewed.    ED Treatments / Results  Labs (all labs ordered are listed, but only abnormal results are displayed) Labs Reviewed    COMPREHENSIVE METABOLIC PANEL - Abnormal; Notable for the following components:      Result Value   Glucose, Bld 104 (*)    Calcium 8.8 (*)    All other components within normal limits  CBC  TROPONIN I    EKG None  Radiology Dg Chest 2 View  Result Date: 10/31/2017 CLINICAL DATA:  Chest pain and sore throat for 5 days. EXAM: CHEST - 2 VIEW COMPARISON:  04/07/2017 FINDINGS: The cardiomediastinal silhouette is unchanged with the heart being upper limits of normal in size. No airspace consolidation, edema, pleural effusion, pneumothorax is identified. No acute osseous abnormality is seen. IMPRESSION: No active cardiopulmonary disease. Electronically Signed   By: Logan Bores M.D.   On: 10/31/2017 11:54    Procedures Procedures (including critical care time)  Medications Ordered in ED Medications - No data to display   Initial Impression / Assessment and Plan / ED Course  I have reviewed the triage vital signs and the nursing notes.  Pertinent labs & imaging results that were available during my care of the patient were reviewed by me and considered in my medical decision making (see chart for details).    KG without ischemic changes.  Overall well-appearing.  Primary care follow-up.  Low risk for cardiac disease.  Doubt ACS.  Doubt PE.  Doubt dissection.  Symptoms are likely GI related given the intermittent sharp discomfort the last for a few seconds.  Patient encouraged to return to the emergency department for new or worsening symptoms  Final Clinical Impressions(s) / ED Diagnoses   Final diagnoses:  Precordial chest pain    ED Discharge Orders         Ordered    omeprazole (PRILOSEC) 20 MG capsule  Daily     10/31/17 1241           Jola Schmidt, MD 11/01/17 [id number]

## 2018-01-19 ENCOUNTER — Telehealth: Payer: Self-pay | Admitting: Family Medicine

## 2018-01-19 DIAGNOSIS — G4730 Sleep apnea, unspecified: Secondary | ICD-10-CM

## 2018-01-19 NOTE — Telephone Encounter (Signed)
Pt informed. Sebastion Jun T Evalee Gerard, CMA  

## 2018-01-19 NOTE — Telephone Encounter (Signed)
Dear [name] Team Please let him know I have put the referral in. He should hear directly from the [facility] at [facility] in the next 2 weeks. If he does not, he needs to le me know. THANKS! [name]

## 2018-01-19 NOTE — Telephone Encounter (Signed)
Pt called  too ask Dr. Nori Riis for a sleep study. ad

## 2018-01-29 ENCOUNTER — Other Ambulatory Visit: Payer: Self-pay

## 2018-01-29 ENCOUNTER — Ambulatory Visit (INDEPENDENT_AMBULATORY_CARE_PROVIDER_SITE_OTHER): Payer: BC Managed Care – PPO | Admitting: Family Medicine

## 2018-01-29 VITALS — BP 110/80 | HR 96 | Temp 99.3°F | Ht 68.0 in | Wt 293.1 lb

## 2018-01-29 DIAGNOSIS — H8110 Benign paroxysmal vertigo, unspecified ear: Secondary | ICD-10-CM

## 2018-01-29 DIAGNOSIS — G5712 Meralgia paresthetica, left lower limb: Secondary | ICD-10-CM | POA: Diagnosis not present

## 2018-01-29 MED ORDER — MECLIZINE HCL 25 MG PO TABS: 25.0000 mg | ORAL_TABLET | Freq: Three times a day (TID) | ORAL | 0 refills | Status: DC | PRN

## 2018-01-29 NOTE — Patient Instructions (Addendum)
  Keep up the weight loss efforts! Try to wear looser pants that don't press on your side.  If the leg pain is persistent let us know.  Take the meclizine up to 3 times a day as needed for dizzy spells.   If you have questions or concerns please do not hesitate to call at [phone].  Lucila Maine, DO PGY-3, [facility] 01/29/2018 9:54 AM    Vertigo  Vertigo means that you feel like you are moving when you are not. Vertigo can also make you feel like things around you are moving when they are not. This feeling can come and go at any time. Vertigo often goes away on its own. Follow these instructions at home:  Avoid making fast movements.  Avoid driving.  Avoid using heavy machinery.  Avoid doing any task or activity that might cause danger to you or other people if you would have a vertigo attack while you are doing it.  Sit down right away if you feel dizzy or have trouble with your balance.  Take over-the-counter and prescription medicines only as told by your doctor.  Follow instructions from your doctor about which positions or movements you should avoid.  Drink enough fluid to keep your pee (urine) clear or pale yellow.  Keep all follow-up visits as told by your doctor. This is important. Contact a doctor if:  Medicine does not help your vertigo.  You have a fever.  Your problems get worse or you have new symptoms.  Your family or friends see changes in your behavior.  You feel sick to your stomach (nauseous) or you throw up (vomit).  You have a "pins and needles" feeling or you are numb in part of your body. Get help right away if:  You have trouble moving or talking.  You are always dizzy.  You pass out (faint).  You get very bad headaches.  You feel weak or have trouble using your hands, arms, or legs.  You have changes in your hearing.  You have changes in your seeing (vision).  You get a stiff neck.  Bright light starts to  bother you. This information is not intended to replace advice given to you by your health care provider. Make sure you discuss any questions you have with your health care provider. Document Released: 10/13/2007 Document Revised: 06/11/2015 Document Reviewed: 04/28/2014 Elsevier Interactive Patient Education  [employer].

## 2018-01-29 NOTE — Progress Notes (Signed)
    Subjective:    Patient ID: Derrick Scott, male    DOB: 08/06/1975, 43 y.o.   MRN: [id number]   CC: dizzy spells, left leg pain  Dizzy spells- room spinning upon waking since Saturday. The sensation was worse with bending down. He rested and things got better as the day progressed. He has been on the keto diet so he stopped this Saturday thinking it had something to do with the sensation. He felt better Sunday but the sensation returned this morning. He reports he is drinking plenty of water. He used his sister's glucometer and his sugar was in the 130's, he had eaten about an hour before. He reports he had vertigo in 2017 and the sensation is similar to that.    Left leg pain- reports for the past 4 days he is having pain over the outside and front part of his left leg. The pain is burning and sharp. He reports it is sometimes made worse or better by what position he is in but sometimes changing position does not seem to have any effect. He is worried about this. he denies any back pain. He reports he has gained weight and his pants and belt are tight. He denies weakness of his leg. He has had some intermittent numbness.   Smoking status reviewed- non-smoker  Review of Systems- see HPI   Objective:  BP 110/80   Pulse 96   Temp 99.3 F (37.4 C) (Oral)   Ht 5\' 8"  (1.727 m)   Wt 293 lb 2 oz (133 kg)   SpO2 97%   BMI 44.57 kg/m  Vitals and nursing note reviewed  General: well nourished, in no acute distress HEENT: normocephalic, MMM Cardiac: RRR, clear S1 and S2, no murmurs, rubs, or gallops Respiratory: clear to auscultation bilaterally, no increased work of breathing Abdomen: obese abdomen. Soft, non-tender. Extremities: no edema or cyanosis. No pain with palpation of LE.  Skin: warm and dry, no rashes noted Neuro: alert and oriented, no focal deficits. Sensation in tact in LE bilaterally. Strength 5/5. Normal gait.    Assessment & Plan:    Benign paroxysmal  positional vertigo  Provided reassurance, rx for meclizine given, follow up if symptoms persist or fail to improve. Work note given for today.  Compression of lateral cutaneous femoral nerve of thigh, left  Likely due to body habitus. Reassurance provided. Encouraged weight loss efforts. Asked him to wear looser pants/belt. Follow up if persists or worsens.     Return if symptoms worsen or fail to improve.   Lucila Maine, DO Family Medicine Resident PGY-3

## 2018-01-31 DIAGNOSIS — G5712 Meralgia paresthetica, left lower limb: Secondary | ICD-10-CM | POA: Insufficient documentation

## 2018-01-31 NOTE — Assessment & Plan Note (Signed)
  Likely due to body habitus. Reassurance provided. Encouraged weight loss efforts. Asked him to wear looser pants/belt. Follow up if persists or worsens.

## 2018-01-31 NOTE — Assessment & Plan Note (Signed)
  Provided reassurance, rx for meclizine given, follow up if symptoms persist or fail to improve. Work note given for today.

## 2018-02-21 ENCOUNTER — Encounter: Payer: BC Managed Care – PPO | Admitting: Family Medicine

## 2018-03-01 ENCOUNTER — Emergency Department (HOSPITAL_BASED_OUTPATIENT_CLINIC_OR_DEPARTMENT_OTHER)
Admission: EM | Admit: 2018-03-01 | Discharge: 2018-03-01 | Disposition: A | Discharge status: Home / Self Care | Payer: BC Managed Care – PPO | Attending: Emergency Medicine | Admitting: Emergency Medicine

## 2018-03-01 ENCOUNTER — Encounter (HOSPITAL_BASED_OUTPATIENT_CLINIC_OR_DEPARTMENT_OTHER): Payer: Self-pay

## 2018-03-01 ENCOUNTER — Other Ambulatory Visit: Payer: Self-pay

## 2018-03-01 DIAGNOSIS — R300 Dysuria: Secondary | ICD-10-CM | POA: Insufficient documentation

## 2018-03-01 LAB — URINALYSIS, ROUTINE W REFLEX MICROSCOPIC
Bilirubin Urine: NEGATIVE
Glucose, UA: NEGATIVE mg/dL
KETONES UR: NEGATIVE mg/dL
LEUKOCYTE UA: NEGATIVE
Nitrite: NEGATIVE
PH: 6 (ref 5.0–8.0)
Protein, ur: NEGATIVE mg/dL

## 2018-03-01 LAB — URINALYSIS, MICROSCOPIC (REFLEX)

## 2018-03-01 MED ORDER — PHENAZOPYRIDINE HCL 200 MG PO TABS: 200.0000 mg | ORAL_TABLET | Freq: Three times a day (TID) | ORAL | 0 refills | Status: DC | PRN

## 2018-03-01 MED FILL — PHENAZOPYRIDINE 200 MG TAB: 200 | 1 days supply | Qty: 3 | Fill #0

## 2018-03-01 NOTE — Discharge Instructions (Signed)
You were seen in the ED today with burning with urination. You do have have a urine infection. I am sending additional testing and you will be called if this test is abnormal. Call the Urologist listed if pain continues.

## 2018-03-01 NOTE — ED Provider Notes (Signed)
Emergency Department Provider Note   I have reviewed the triage vital signs and the nursing notes.   HISTORY  Chief Complaint Dysuria   HPI Derrick Scott is a 43 y.o. male with PMH of vertigo's to the emergency department for evaluation of urethral pain with urination.  He states it feels like needles but he is not having pain at other times.  He is sexually active with his fiance.  He has not experienced any urethral discharge.  He has no concern for STDs.  No pain in the testicles.  No fevers or chills.  Patient states that his fiance has had some issues with her "pH" but that he thinks that has resolved.  He notes that they did perform anal sex is wondering if that could have had something to do with it.  No penile trauma.  Past Medical History:  Diagnosis Date  . Medial meniscus tear 01/2012   right knee  . Obesity   . Pneumonia   . Renal disorder   . Vertigo, benign paroxysmal 2015    Patient Active Problem List   Diagnosis Date Noted  . 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 Other tear of medial meniscus, current injury, left knee, subsequent encounter 05/04/2017  . Influenza-like illness 02/16/2017  . Pes planus 02/07/2017  . Well adult exam 05/19/2016  . Laryngopharyngeal reflux (LPR) 07/24/2015  . Post corneal transplant 07/25/2014  . 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 Benign paroxysmal positional vertigo 08/30/2013  . Adrenal mass, right ([place]) 05/22/2013  . Sleep apnea 07/31/2009  . OBESITY, NOS 03/16/2006    Past Surgical History:  Procedure Laterality Date  . CORNEAL TRANSPLANT  12/2009  . KNEE ARTHROSCOPY  02/20/2012   Procedure: ARTHROSCOPY KNEE;  Surgeon: Alta Corning, MD;  Location: [place];  Service: Orthopedics;  Laterality: Right;  Chondroplasia patella femoral joint, medial meniscal debriedment    Allergies Patient has no known allergies.  Family History    Problem Relation Age of Onset  . Asthma Mother   . Diabetes Father   . Cancer Father        prostate  . Osteoarthritis Sister   . Diabetes Sister     Social History Social History   Tobacco Use  . Smoking status: Never Smoker  . Smokeless tobacco: Never Used  Substance Use Topics  . Alcohol use: No  . Drug use: No    Review of Systems  Constitutional: No fever/chills Eyes: No visual changes. ENT: No sore throat. Cardiovascular: Denies chest pain. Respiratory: Denies shortness of breath. Gastrointestinal: No abdominal pain.  No nausea, no vomiting.  No diarrhea.  No constipation. Genitourinary: Positive for dysuria. Musculoskeletal: Negative for back pain. Skin: Negative for rash. Neurological: Negative for headaches, focal weakness or numbness.  10-point ROS otherwise negative.  ____________________________________________   PHYSICAL EXAM:  VITAL SIGNS: ED Triage Vitals  Enc Vitals Group     BP 03/01/18 1538 130/76     Pulse Rate 03/01/18 1538 92     Resp 03/01/18 1538 18     Temp 03/01/18 1538 98.5 F (36.9 C)     Temp Source 03/01/18 1538 Oral     SpO2 03/01/18 1538 100 %     Weight 03/01/18 1537 292 lb (132.5 kg)     Height 03/01/18 1537 5\' 9"  (1.753 m)   Constitutional: Alert and oriented. Well appearing and in no acute distress. Eyes: Conjunctivae are normal.  Head: Atraumatic. Nose: No congestion/rhinnorhea. Mouth/Throat: Mucous membranes are moist.  Neck: No stridor.  Cardiovascular: Normal rate, regular rhythm.  Respiratory: Normal respiratory effort.  Gastrointestinal: Soft and nontender. No distention.  Genitourinary: Normal external genitalia. No urethral irritation or discharge.  Musculoskeletal:  No gross deformities of extremities. Neurologic:  Normal speech and language.  Skin: No rash noted.   ____________________________________________   LABS (all labs ordered are listed, but only abnormal results are displayed)  Labs  Reviewed  URINALYSIS, ROUTINE W REFLEX MICROSCOPIC - Abnormal; Notable for the following components:      Result Value   Specific Gravity, Urine >1.030 (*)    Hgb urine dipstick SMALL (*)    All other components within normal limits  URINALYSIS, MICROSCOPIC (REFLEX) - Abnormal; Notable for the following components:   Bacteria, UA RARE (*)    All other components within normal limits  GC/CHLAMYDIA PROBE AMP ([place]) NOT AT [facility]   ____________________________________________   PROCEDURES  Procedure(s) performed:   Procedures  None ____________________________________________   INITIAL IMPRESSION / ASSESSMENT AND PLAN / ED COURSE  Pertinent labs & imaging results that were available during my care of the patient were reviewed by me and considered in my medical decision making (see chart for details).  Patient with urethral pain with urination.  I performed an exam with patient consent.  I do not appreciate any urethral discharge or injury.  Urinalysis shows no evidence of infection.  Plan for trial of Pyridium.  Advised that they use condoms for 1 to 2 weeks and follow-up with urology if symptoms continue.    ____________________________________________  FINAL CLINICAL IMPRESSION(S) / ED DIAGNOSES  Final diagnoses:  Dysuria    NEW OUTPATIENT MEDICATIONS STARTED DURING THIS VISIT:  Discharge Medication List as of 03/01/2018  5:11 PM    START taking these medications   Details  phenazopyridine (PYRIDIUM) 200 MG tablet Take 1 tablet (200 mg total) by mouth 3 (three) times daily as needed for pain., Starting Thu 03/01/2018, Print        Note:  This document was prepared using Dragon voice recognition software and may include unintentional dictation errors.  Nanda Quinton, MD Emergency Medicine    Eiman Maret, [facility], MD 03/02/18 [phone]

## 2018-03-01 NOTE — ED Triage Notes (Signed)
C/o dysuria x 2 days-NAD-steady gait

## 2018-03-02 ENCOUNTER — Telehealth: Payer: Self-pay

## 2018-03-02 LAB — GC/CHLAMYDIA PROBE AMP (~~LOC~~) NOT AT ARMC
Chlamydia: NEGATIVE
NEISSERIA GONORRHEA: NEGATIVE

## 2018-03-02 MED ORDER — AZITHROMYCIN 500 MG PO TABS: 500.0000 mg | ORAL_TABLET | Freq: Every day | ORAL | 0 refills | Status: DC

## 2018-03-02 NOTE — Telephone Encounter (Signed)
Patient called. Asked that PCP look at ED note from yesterday.  Has dysuria and tenderness at head of penis. UA was negative. GC/Chlamydia pending. Was given rx for #3 Pyridium but finished that without relief. Wonders if he should have been given antibiotic.   Advised again that UA was negative and awaiting further results but states that the dysuria is very bad and he is concerned.  Call back is [phone]   Danley Danker, RN [facility] RN)

## 2018-03-02 NOTE — Telephone Encounter (Signed)
Discussed Will treat for NGU  Red flags discussed UTD reviewed and given expense of doxy vs azithromycin and possible intolerance of single dose azithromycin will treat e 5 days 500 mg a day azithromycin  If no better 3 days, he will call and I will see him as work in on my schedule

## 2018-03-02 NOTE — Telephone Encounter (Signed)
Pt calls to check status of response from Dr. Nori Riis.  Derrick Scott, Derrick Scott, Derrick Scott

## 2018-03-03 ENCOUNTER — Emergency Department (HOSPITAL_BASED_OUTPATIENT_CLINIC_OR_DEPARTMENT_OTHER)
Admission: EM | Admit: 2018-03-03 | Discharge: 2018-03-04 | Disposition: A | Discharge status: Home / Self Care | Payer: BC Managed Care – PPO | Attending: Emergency Medicine | Admitting: Emergency Medicine

## 2018-03-03 ENCOUNTER — Emergency Department (HOSPITAL_BASED_OUTPATIENT_CLINIC_OR_DEPARTMENT_OTHER): Payer: BC Managed Care – PPO

## 2018-03-03 ENCOUNTER — Other Ambulatory Visit: Payer: Self-pay

## 2018-03-03 DIAGNOSIS — R1032 Left lower quadrant pain: Secondary | ICD-10-CM | POA: Insufficient documentation

## 2018-03-03 DIAGNOSIS — Z79899 Other long term (current) drug therapy: Secondary | ICD-10-CM | POA: Insufficient documentation

## 2018-03-03 DIAGNOSIS — N390 Urinary tract infection, site not specified: Secondary | ICD-10-CM | POA: Diagnosis not present

## 2018-03-03 DIAGNOSIS — R300 Dysuria: Secondary | ICD-10-CM | POA: Diagnosis present

## 2018-03-03 LAB — COMPREHENSIVE METABOLIC PANEL
ALBUMIN: 3.8 g/dL (ref 3.5–5.0)
ALT: 41 U/L (ref 0–44)
ANION GAP: 6 (ref 5–15)
AST: 29 U/L (ref 15–41)
Alkaline Phosphatase: 68 U/L (ref 38–126)
BILIRUBIN TOTAL: 0.4 mg/dL (ref 0.3–1.2)
BUN: 9 mg/dL (ref 6–20)
CO2: 23 mmol/L (ref 22–32)
Calcium: 8.5 mg/dL — ABNORMAL LOW (ref 8.9–10.3)
Chloride: 103 mmol/L (ref 98–111)
Creatinine, Ser: 1.12 mg/dL (ref 0.61–1.24)
GLUCOSE: 123 mg/dL — AB (ref 70–99)
Potassium: 3.5 mmol/L (ref 3.5–5.1)
Sodium: 132 mmol/L — ABNORMAL LOW (ref 135–145)
TOTAL PROTEIN: 7.8 g/dL (ref 6.5–8.1)

## 2018-03-03 LAB — URINALYSIS, ROUTINE W REFLEX MICROSCOPIC
BILIRUBIN URINE: NEGATIVE
Glucose, UA: NEGATIVE mg/dL
KETONES UR: 15 mg/dL — AB
LEUKOCYTE UA: NEGATIVE
NITRITE: NEGATIVE
Protein, ur: 30 mg/dL — AB
pH: 5.5 (ref 5.0–8.0)

## 2018-03-03 LAB — CBC WITH DIFFERENTIAL/PLATELET
Abs Immature Granulocytes: 0.04 10*3/uL (ref 0.00–0.07)
BASOS ABS: 0.1 10*3/uL (ref 0.0–0.1)
BASOS PCT: 1 %
EOS ABS: 0.3 10*3/uL (ref 0.0–0.5)
Eosinophils Relative: 3 %
HCT: 45.3 % (ref 39.0–52.0)
Hemoglobin: 14.6 g/dL (ref 13.0–17.0)
IMMATURE GRANULOCYTES: 1 %
Lymphocytes Relative: 25 %
Lymphs Abs: 2 10*3/uL (ref 0.7–4.0)
MCH: 28.4 pg (ref 26.0–34.0)
MCHC: 32.2 g/dL (ref 30.0–36.0)
MCV: 88.1 fL (ref 80.0–100.0)
Monocytes Absolute: 1.1 10*3/uL — ABNORMAL HIGH (ref 0.1–1.0)
Monocytes Relative: 13 %
NRBC: 0 % (ref 0.0–0.2)
Neutro Abs: 4.7 10*3/uL (ref 1.7–7.7)
Neutrophils Relative %: 57 %
PLATELETS: 227 10*3/uL (ref 150–400)
RBC: 5.14 MIL/uL (ref 4.22–5.81)
RDW: 13.3 % (ref 11.5–15.5)
WBC: 8.1 10*3/uL (ref 4.0–10.5)

## 2018-03-03 LAB — URINALYSIS, MICROSCOPIC (REFLEX)

## 2018-03-03 LAB — LACTIC ACID, PLASMA: LACTIC ACID, VENOUS: 1.3 mmol/L (ref 0.5–1.9)

## 2018-03-03 MED ORDER — SODIUM CHLORIDE 0.9 % IV SOLN
INTRAVENOUS | Status: DC | PRN
  Administered 2018-03-03: 250 mL via INTRAVENOUS

## 2018-03-03 MED ORDER — SODIUM CHLORIDE 0.9 % IV SOLN
1.0000 g | Freq: Once | INTRAVENOUS | Status: AC
  Administered 2018-03-03: 1 g via INTRAVENOUS
  Filled 2018-03-03: qty 10

## 2018-03-03 MED ORDER — CEPHALEXIN 500 MG PO CAPS: 500.0000 mg | ORAL_CAPSULE | Freq: Two times a day (BID) | ORAL | 0 refills | Status: AC

## 2018-03-03 MED ORDER — LACTATED RINGERS IV BOLUS
1000.0000 mL | Freq: Once | INTRAVENOUS | Status: AC
  Administered 2018-03-03: 1000 mL via INTRAVENOUS

## 2018-03-03 NOTE — ED Triage Notes (Signed)
Pt states that he was seen here Thursday for dysuria was given medication and states that he is waiting for the results for that. Called his PCP Friday and they prescribed him Azithromycin and he has taken 2 doses. Now patient is having a fever. Took tylenol approx 1 hour pta total of 1000mg . C/o lower back pain

## 2018-03-03 NOTE — ED Notes (Signed)
Attempted to get pt for CT, RN starting IV

## 2018-03-03 NOTE — Discharge Instructions (Signed)
If you develop continued fever or worsening fever, vomiting, severe back pain, weakness or numbness in your legs, or any other new/concerning symptoms then return to the ER for evaluation.

## 2018-03-03 NOTE — ED Provider Notes (Signed)
[place] EMERGENCY DEPARTMENT Provider Note   CSN: [id number] Arrival date & time: 03/03/18  1944     History   Chief Complaint Chief Complaint  Patient presents with  . Dysuria  . Fever    HPI Derrick Scott is a 43 y.o. male.  HPI  43 year old male presents with dysuria, fever, and left leg numbness/pain.  Dysuria has been present for 2 days and he was seen here and had GC/chlamydia and UA.  He was discharged on azithromycin which she has been taking.  Dysuria still present and he gets penile pain when urinating.  There is no abdominal pain or urinary frequency.  Today he is also noted some on and off low back pain in the middle of his back.  Developed a fever up to 102 today.  No vomiting.  No cough or shortness of breath.  Occasionally he notes a little bit of wetness in his underwear but has not specifically seen discharge and has no concern for STI.  Took Tylenol prior to arrival.  For about a month he is had left thigh numbness and some discomfort radiating down his thigh from his buttocks.  He was told his belt might be too tight.  He is also now having a little bit of discomfort on the medial aspect.  Family history of DVT.  He has not noticed any swelling to the extremity but is concerned for DVT. Pain is currently mild.  Past Medical History:  Diagnosis Date  . Medial meniscus tear 01/2012   right knee  . Obesity   . Pneumonia   . Renal disorder   . Vertigo, benign paroxysmal 2015    Patient Active Problem List   Diagnosis Date Noted  . 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 Other tear of medial meniscus, current injury, left knee, subsequent encounter 05/04/2017  . Influenza-like illness 02/16/2017  . Pes planus 02/07/2017  . Well adult exam 05/19/2016  . Laryngopharyngeal reflux (LPR) 07/24/2015  . Post corneal transplant 07/25/2014  . 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 Benign paroxysmal  positional vertigo 08/30/2013  . Adrenal mass, right ([place]) 05/22/2013  . Sleep apnea 07/31/2009  . OBESITY, NOS 03/16/2006    Past Surgical History:  Procedure Laterality Date  . CORNEAL TRANSPLANT  12/2009  . KNEE ARTHROSCOPY  02/20/2012   Procedure: ARTHROSCOPY KNEE;  Surgeon: Alta Corning, MD;  Location: [place];  Service: Orthopedics;  Laterality: Right;  Chondroplasia patella femoral joint, medial meniscal debriedment        Home Medications    Prior to Admission medications   Medication Sig Start Date End Date Taking? Authorizing Provider  azithromycin (ZITHROMAX) 500 MG tablet Take 1 tablet (500 mg total) by mouth daily. 03/02/18   Dickie La, MD  cephALEXin (KEFLEX) 500 MG capsule Take 1 capsule (500 mg total) by mouth 2 (two) times daily for 7 days. 03/03/18 03/10/18  Sherwood Gambler, MD  meclizine (ANTIVERT) 25 MG tablet Take 1 tablet (25 mg total) by mouth 3 (three) times daily as needed for dizziness. 01/29/18   Steve Rattler, DO  phenazopyridine (PYRIDIUM) 200 MG tablet Take 1 tablet (200 mg total) by mouth 3 (three) times daily as needed for pain. 03/01/18   Long, [facility], MD    Family History Family History  Problem Relation Age of Onset  . Asthma Mother   . Diabetes Father   . Cancer Father  prostate  . Osteoarthritis Sister   . Diabetes Sister     Social History Social History   Tobacco Use  . Smoking status: Never Smoker  . Smokeless tobacco: Never Used  Substance Use Topics  . Alcohol use: No  . Drug use: No     Allergies   Patient has no known allergies.   Review of Systems Review of Systems  Constitutional: Positive for fever.  Respiratory: Negative for cough and shortness of breath.   Cardiovascular: Negative for leg swelling.  Gastrointestinal: Negative for abdominal pain and vomiting.  Genitourinary: Positive for dysuria and penile pain. Negative for discharge, frequency, scrotal swelling, testicular pain and  urgency.  Musculoskeletal: Positive for back pain and myalgias.  Neurological: Positive for numbness. Negative for weakness.  All other systems reviewed and are negative.    Physical Exam Updated Vital Signs BP 107/75 (BP Location: Right Arm)   Pulse (!) 104   Temp 98.8 F (37.1 C) (Oral)   Resp 20   Ht 5\' 9"  (1.753 m)   Wt 128.4 kg   SpO2 97%   BMI 41.79 kg/m   Physical Exam Vitals signs and nursing note reviewed.  Constitutional:      Appearance: He is well-developed. He is obese.  HENT:     Head: Normocephalic and atraumatic.     Right Ear: External ear normal.     Left Ear: External ear normal.     Nose: Nose normal.  Eyes:     General:        Right eye: No discharge.        Left eye: No discharge.  Neck:     Musculoskeletal: Neck supple.  Cardiovascular:     Rate and Rhythm: Normal rate and regular rhythm.     Heart sounds: Normal heart sounds.  Pulmonary:     Effort: Pulmonary effort is normal.     Breath sounds: Normal breath sounds.  Abdominal:     Palpations: Abdomen is soft.     Tenderness: There is no abdominal tenderness. There is no right CVA tenderness or left CVA tenderness.  Genitourinary:    Penis: No erythema, tenderness or discharge.   Musculoskeletal:     Lumbar back: He exhibits no tenderness and no bony tenderness.  Skin:    General: Skin is warm and dry.  Neurological:     Mental Status: He is alert.     Comments: 5/5 strength in BLE. Subjective decreased sensation in left lateral thigh.  Psychiatric:        Mood and Affect: Mood is not anxious.      ED Treatments / Results  Labs (all labs ordered are listed, but only abnormal results are displayed) Labs Reviewed  URINALYSIS, ROUTINE W REFLEX MICROSCOPIC - Abnormal; Notable for the following components:      Result Value   Specific Gravity, Urine >1.030 (*)    Hgb urine dipstick MODERATE (*)    Ketones, ur 15 (*)    Protein, ur 30 (*)    All other components within normal  limits  URINALYSIS, MICROSCOPIC (REFLEX) - Abnormal; Notable for the following components:   Bacteria, UA FEW (*)    All other components within normal limits  COMPREHENSIVE METABOLIC PANEL - Abnormal; Notable for the following components:   Sodium 132 (*)    Glucose, Bld 123 (*)    Calcium 8.5 (*)    All other components within normal limits  CBC WITH DIFFERENTIAL/PLATELET - Abnormal; Notable for the  following components:   Monocytes Absolute 1.1 (*)    All other components within normal limits  URINE CULTURE  LACTIC ACID, PLASMA  LACTIC ACID, PLASMA    EKG None  Radiology Ct Renal Stone Study  Result Date: 03/03/2018 CLINICAL DATA:  Dysuria for 3 days. EXAM: CT ABDOMEN AND PELVIS WITHOUT CONTRAST TECHNIQUE: Multidetector CT imaging of the abdomen and pelvis was performed following the standard protocol without IV contrast. COMPARISON:  November 01, 2014 FINDINGS: Lower chest: No acute abnormality. Hepatobiliary: Diffuse low density of the liver is identified. No focal liver lesion is noted. The gallbladder is normal. The biliary tree is normal. Pancreas: Unremarkable. No pancreatic ductal dilatation or surrounding inflammatory changes. Spleen: Normal in size without focal abnormality. Adrenals/Urinary Tract: Adrenal glands are unremarkable. Kidneys are normal, without renal calculi, focal lesion, or hydronephrosis. Bladder is unremarkable. Stomach/Bowel: Stomach is within normal limits. Appendix appears normal. No evidence of bowel wall thickening, distention, or inflammatory changes. Vascular/Lymphatic: No significant vascular findings are present. No enlarged abdominal or pelvic lymph nodes. Reproductive: Prostate is unremarkable. Other: Small umbilical herniation of mesenteric fat is noted. Musculoskeletal: No acute or significant osseous findings. IMPRESSION: No acute abnormality identified in the abdomen and pelvis. No nephrolithiasis or hydronephrosis is identified bilaterally.  Electronically Signed   By: Abelardo Diesel M.D.   On: 03/03/2018 22:37    Procedures Procedures (including critical care time)  Medications Ordered in ED Medications  lactated ringers bolus 1,000 mL (1,000 mLs Intravenous New Bag/Given 03/03/18 2345)  0.9 %  sodium chloride infusion ( Intravenous Stopped 03/03/18 2345)  cefTRIAXone (ROCEPHIN) 1 g in sodium chloride 0.9 % 100 mL IVPB ( Intravenous Stopped 03/03/18 2314)     Initial Impression / Assessment and Plan / ED Course  I have reviewed the triage vital signs and the nursing notes.  Pertinent labs & imaging results that were available during my care of the patient were reviewed by me and considered in my medical decision making (see chart for details).     Given the dysuria and 11-20 WBCs in the urine, this is most likely an acute urinary tract infection causing the fever and discomfort.  However the vague left-sided thigh numbness is somewhat concerning.  However he has no significant risk factors for spinal infection such as IV drug abuse or immunosuppression.  He is not a known diabetic.  I discussed this with him and he agrees that it would be unlikely to have spinal cord infection and he does not want to be transferred for MRI tonight.  I think since the urine is so symptomatically present, it is reasonable to treat for this with IV Rocephin here and Keflex.  He had a stone study that does not show obvious stone for an infected stone.  Otherwise his labs and exam are reassuring.  We will discharge him home, but I discussed strict return precautions.  Final Clinical Impressions(s) / ED Diagnoses   Final diagnoses:  Acute urinary tract infection    ED Discharge Orders         Ordered    cephALEXin (KEFLEX) 500 MG capsule  2 times daily     03/03/18 2337           Sherwood Gambler, MD 03/03/18 2357

## 2018-03-03 NOTE — ED Notes (Signed)
Pt now c/o 2 separate problems. First is the new fever with the dysuria. Second problem pt c/o R leg pain and subjective swelling for a couple of weeks. Pt concerned because he has a family hx of DVT.

## 2018-03-05 LAB — URINE CULTURE: Culture: NO GROWTH

## 2018-03-06 ENCOUNTER — Ambulatory Visit (INDEPENDENT_AMBULATORY_CARE_PROVIDER_SITE_OTHER): Payer: BC Managed Care – PPO | Admitting: Student in an Organized Health Care Education/Training Program

## 2018-03-06 ENCOUNTER — Other Ambulatory Visit: Payer: Self-pay

## 2018-03-06 VITALS — BP 108/68 | HR 98 | Temp 98.8°F | Ht 69.0 in | Wt 284.6 lb

## 2018-03-06 DIAGNOSIS — R300 Dysuria: Secondary | ICD-10-CM

## 2018-03-06 DIAGNOSIS — N342 Other urethritis: Secondary | ICD-10-CM | POA: Diagnosis not present

## 2018-03-06 NOTE — Assessment & Plan Note (Signed)
Uncertain etiology. Does not seem to be bacterial based on workup with negative GC/Chlamydia, negative urine culture. Considered alternative bacterial diagnosis such as T vaginalis or mycoplasma vs viral etiology vs trauma from intercourse. Patient has had multiple antibiotic courses and continues to complete a course of Keflex. At this time, given that he has been sexually active and his workup does not suggest infection, trauma from intercourse seems to be a more likely etiology of his symptoms. - abstain from all sexual activity for 7 days to allow inflammation to calm down - oral NSAIDs - advised that partner should be tested - if symptoms persist after 7 days, would refer to urology for possible scope

## 2018-03-06 NOTE — Patient Instructions (Signed)
It was a pleasure seeing you today in our clinic.   - Please take a break from all intercourse for the next 7 days - you may complete your course of keflex, but we will not prescribe further antibiotics at this time - please encourage your partner to make an appointment to be tested  Our clinic's number is [phone]. Please call with questions or concerns about what we discussed today.  Be well, Dr. Burr Medico

## 2018-03-06 NOTE — Progress Notes (Signed)
   CC: dysuria  HPI: Derrick Scott is a 43 y.o. male with PMH: Patient Active Problem List   Diagnosis Date Noted  . Urethritis 03/06/2018  . 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 Other tear of medial meniscus, current injury, left knee, subsequent encounter 05/04/2017  . Pes planus 02/07/2017  . Laryngopharyngeal reflux (LPR) 07/24/2015  . Post corneal transplant 07/25/2014  . 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 Benign paroxysmal positional vertigo 08/30/2013  . Adrenal mass, right ([place]) 05/22/2013  . Sleep apnea 07/31/2009  . OBESITY, NOS 03/16/2006     Patient was in his usual state of health until 5 days ago when he developed pain with urination.  Pain is worse when he restarts urinating and improves with stream.  He was seen in the family medicine office and diagnosed with nongonococcal urethritis.  GC chlamydia were negative.  He was diagnosed with 5 days of azithromycin.  He completed this course.  However dysuria persisted and he went to the emergency department.  He underwent urinalysis which was not indicative of infectious source.  Urine culture was no growth.  He did receive IV Rocephin and was started on a course of Keflex in the emergency department.  He reports that he had fevers yesterday.  He felt very sick with the fevers, however they have improved and he has not been febrile at all today.  Dysuria continues.  He continues to be sexually active with one male partner is asymptomatic. He has noted scant clear discharge on his underwear.  Review of Symptoms:  See HPI for ROS.   CC, SH/smoking status, and VS noted.  Objective: BP 108/68   Pulse 98   Temp 98.8 F (37.1 C) (Oral)   Ht 5\' 9"  (1.753 m)   Wt 284 lb 9.6 oz (129.1 kg)   SpO2 98%   BMI 42.03 kg/m  GEN: NAD, alert, cooperative, and pleasant. RESPIRATORY: Comfortable work of breathing, speaks in full sentences CV: Regular rate noted, distal  extremities well perfused and warm without edema GI: Soft, nondistended SKIN: warm and dry, no rashes or lesions NEURO: Scott-XII grossly intact MSK: Moves 4 extremities equally PSYCH: AAOx3, appropriate affect   Assessment and plan:  Urethritis Uncertain etiology. Does not seem to be bacterial based on workup with negative GC/Chlamydia, negative urine culture. Considered alternative bacterial diagnosis such as T vaginalis or mycoplasma vs viral etiology vs trauma from intercourse. Patient has had multiple antibiotic courses and continues to complete a course of Keflex. At this time, given that he has been sexually active and his workup does not suggest infection, trauma from intercourse seems to be a more likely etiology of his symptoms. - abstain from all sexual activity for 7 days to allow inflammation to calm down - oral NSAIDs - advised that partner should be tested - if symptoms persist after 7 days, would refer to urology for possible scope   Everrett Coombe, MD,MS,  PGY3 03/06/2018 5:23 PM

## 2018-03-16 ENCOUNTER — Ambulatory Visit (INDEPENDENT_AMBULATORY_CARE_PROVIDER_SITE_OTHER): Payer: BC Managed Care – PPO | Admitting: Family Medicine

## 2018-03-16 ENCOUNTER — Ambulatory Visit: Payer: Self-pay

## 2018-03-16 ENCOUNTER — Encounter: Payer: Self-pay | Admitting: Family Medicine

## 2018-03-16 VITALS — BP 141/91 | Ht 69.0 in | Wt 276.0 lb

## 2018-03-16 DIAGNOSIS — M25552 Pain in left hip: Secondary | ICD-10-CM

## 2018-03-16 DIAGNOSIS — G5712 Meralgia paresthetica, left lower limb: Secondary | ICD-10-CM | POA: Diagnosis not present

## 2018-03-16 MED ORDER — METHYLPREDNISOLONE ACETATE 40 MG/ML IJ SUSP
40.0000 mg | Freq: Once | INTRAMUSCULAR | Status: AC
  Administered 2018-03-16: 40 mg via INTRA_ARTICULAR

## 2018-03-16 NOTE — Assessment & Plan Note (Signed)
Ultrasound guided injection today.  He will follow-up with me via phone if any new or worsening symptoms and will follow-up as well in 10 to 14 days with a status report.

## 2018-03-16 NOTE — Progress Notes (Signed)
  Derrick Scott - 43 y.o. male MRN [id number]  Date of birth: [birth year]    SUBJECTIVE:      Chief Complaint:/ HPI:  #1.  Reports 3 to 4 weeks of left anterior thigh pain and numbness.  Knows no inciting event.  Has gotten worse.  There is a very specific area on his anterior thigh that is pins-and-needles type sensation but also numb.  6 out of 10 at times.  Activity seems to make it worse.  It is improved overnight but not necessarily resolved.  #2.  Follow-up urinary frequency.  I had given him 5-day course of a azithromycin and he said symptoms totally resolved within the first 36 hours of treatment.  He is quite happy about that.   ROS:     No lower extremity weakness.  No abdominal pain.  No fever.  No unusual weight change.  PERTINENT  PMH / PSH FH / / SH:  Past Medical, Surgical, Social, and Family History Reviewed & Updated in the EMR.  Pertinent findings include:  Significant DJD knees.  OBJECTIVE: BP (!) 141/91   Ht 5\' 9"  (1.753 m)   Wt 276 lb (125.2 kg)   BMI 40.76 kg/m   Physical Exam:  Vital signs are reviewed. GENERAL: Well-developed overweight male no acute distress HIPS: Internal and external rotation full and painless.  Normal hip flexion extension that is painless.  There is no tenderness to palpation over the left anterior thigh. NEURO: Area of change in sensation is in a well-defined circular anterior thigh in the distribution of the lateral cutaneous femoral nerve. PELVIS: Lateral compression test was performed on pelvis with him lying in the side position.  He received no relief with 30 seconds of pressure.  This exam is somewhat limited by his habitus.  ULTRASOUND: The left inguinal area was visualized using ultrasound.  The femoral artery was identified.  The ASIS was identified.  Using these 2 landmarks, the lateral femoral cutaneous nerve was identified and juxtaposition with the sartorius muscle.  There seem to be a small amount of fluid around this  nerve sheath.  PROCEDURE: Informed consent given for ultrasound-guided injection into the sheath of the lateral femoral cutaneous nerve on the left side.  Signed copy in the chart.  Appropriate timeout was taken.  Area was prepped and draped in your usual sterile fashion for ultrasound-guided injection.  Using a 1-3 combination of methylprednisolone 40 mg/mL and 3 cc of 1% lidocaine without epinephrine and a 5 cc syringe attached to a 22-gauge spinal needle, ultrasound guidance was used to inject the mixture into the sheath around the nerve.  Patient tolerated procedure well.  No complications.  Patient was able to experience some mild improvement in pins and needle sensation prior to leaving.  He left without any unusual side effects.  ASSESSMENT & PLAN:  #1.  Urinary frequency and burning was resolved after treatment with a azithromycin. #2.  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  He will follow-up with me via phone if any new or worsening symptoms and will follow-up as well in 10 to 14 days with a status report.

## 2018-03-21 ENCOUNTER — Telehealth: Payer: Self-pay

## 2018-03-21 MED ORDER — AMITRIPTYLINE HCL 25 MG PO TABS: 25.0000 mg | ORAL_TABLET | Freq: Every day | ORAL | 1 refills | Status: DC

## 2018-03-21 NOTE — Telephone Encounter (Signed)
Okay to give him out a work note for those dates.  Thanks

## 2018-03-21 NOTE — Telephone Encounter (Signed)
Work note printed for pt. He will pick it up at our office sometime tomorrow morning.

## 2018-03-21 NOTE — Telephone Encounter (Signed)
Pt understands and agrees with the plan. He asked for a work note to write him out of work 03/22/2018-03/23/2018 since he is having trouble getting up and around all day at work. He will come by and pick it up tomorrow when it's ready. Please advise.

## 2018-03-21 NOTE — Telephone Encounter (Signed)
It may take several weeks for the injection to help.  I will call in some amitriptyline for him to take at night.  I would like to see him back in the office in 3 to 4 weeks.  The amitriptyline should help him sleep and should help with the pain.

## 2018-03-26 ENCOUNTER — Telehealth: Payer: Self-pay

## 2018-03-26 DIAGNOSIS — G4730 Sleep apnea, unspecified: Secondary | ICD-10-CM

## 2018-03-26 NOTE — Telephone Encounter (Signed)
[facility] called stating they need an actual order put in for the "split." They see what you want in the comment section, however, they need an actual order reflecting this. Please advise.

## 2018-03-29 NOTE — Telephone Encounter (Signed)
Dear [name] Team I donot have that in drop down on my orders so it would help if they can give [place] the order CPT code or order number [facility]! [name]

## 2018-03-30 NOTE — Addendum Note (Signed)
Addended byDorcas Mcmurray L on: 03/30/2018 11:49 AM   Modules accepted: Orders

## 2018-03-30 NOTE — Telephone Encounter (Signed)
[place] and spoke to [place], she stated that the CPT code for the Split Night is [phone]. Routing to PCP. April Zimmerman Rumple, CMA

## 2018-03-31 ENCOUNTER — Ambulatory Visit (HOSPITAL_BASED_OUTPATIENT_CLINIC_OR_DEPARTMENT_OTHER): Payer: BC Managed Care – PPO

## 2018-03-31 ENCOUNTER — Encounter (HOSPITAL_BASED_OUTPATIENT_CLINIC_OR_DEPARTMENT_OTHER): Payer: BC Managed Care – PPO

## 2018-04-09 ENCOUNTER — Encounter (HOSPITAL_BASED_OUTPATIENT_CLINIC_OR_DEPARTMENT_OTHER): Payer: Self-pay | Admitting: *Deleted

## 2018-04-09 ENCOUNTER — Other Ambulatory Visit: Payer: Self-pay

## 2018-04-09 ENCOUNTER — Emergency Department (HOSPITAL_BASED_OUTPATIENT_CLINIC_OR_DEPARTMENT_OTHER): Payer: BC Managed Care – PPO

## 2018-04-09 ENCOUNTER — Emergency Department (HOSPITAL_BASED_OUTPATIENT_CLINIC_OR_DEPARTMENT_OTHER)
Admission: EM | Admit: 2018-04-09 | Discharge: 2018-04-09 | Disposition: A | Discharge status: Home / Self Care | Payer: BC Managed Care – PPO | Attending: Emergency Medicine | Admitting: Emergency Medicine

## 2018-04-09 DIAGNOSIS — Z79899 Other long term (current) drug therapy: Secondary | ICD-10-CM | POA: Diagnosis not present

## 2018-04-09 DIAGNOSIS — R05 Cough: Secondary | ICD-10-CM | POA: Diagnosis present

## 2018-04-09 DIAGNOSIS — B9789 Other viral agents as the cause of diseases classified elsewhere: Secondary | ICD-10-CM | POA: Insufficient documentation

## 2018-04-09 DIAGNOSIS — J069 Acute upper respiratory infection, unspecified: Secondary | ICD-10-CM | POA: Insufficient documentation

## 2018-04-09 MED ORDER — IPRATROPIUM-ALBUTEROL 0.5-2.5 (3) MG/3ML IN SOLN
3.0000 mL | Freq: Four times a day (QID) | RESPIRATORY_TRACT | Status: DC
  Administered 2018-04-09: 3 mL via RESPIRATORY_TRACT
  Filled 2018-04-09: qty 3

## 2018-04-09 MED ORDER — ALBUTEROL SULFATE HFA 108 (90 BASE) MCG/ACT IN AERS: 1.0000 | INHALATION_SPRAY | Freq: Four times a day (QID) | RESPIRATORY_TRACT | 0 refills | Status: DC | PRN

## 2018-04-09 NOTE — ED Provider Notes (Signed)
[facility] EMERGENCY DEPARTMENT Provider Note   CSN: [id number] Arrival date & time: 04/09/18  1702    History   Chief Complaint Chief Complaint  Patient presents with  . Cough    HPI Derrick Scott is a 43 y.o. male who presents for evaluation of cough x1 week.  He states that over the last week, he has had some rhinorrhea, dry cough.  No hemoptysis noted.  Patient states he has not taken medication for symptoms.  Patient states today, he felt like he was having to take heavier breaths to get a full deep breath in.  Patient states that this became concerning so he came to the emergency department.  Reports a history of asthma but states he has not had an asthma flare in about a year.  He states all his daughter about a week ago.  She was fine in her normal state of health at that time.  He reports a few days later, she started having URI symptoms.  She was seen by PCP was tested for influenza novel coronavirus both of which were negative.  He states he has not had any other sick contacts.  He reported an occasional chest tightness when the symptoms first began but states that that has resolved now.  Patient states he is not a current smoker.  Patient denies any fevers, abdominal pain, nausea/vomiting. He denies any exogenous hormone [place], recent immobilization, prior history of DVT/PE, recent surgery, leg swelling, or long travel.     The history is provided by the patient.    Past Medical History:  Diagnosis Date  . Medial meniscus tear 01/2012   right knee  . Obesity   . Pneumonia   . Renal disorder   . Vertigo, benign paroxysmal 2015    Patient Active Problem List   Diagnosis Date Noted  . Urethritis 03/06/2018  . 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 Other tear of medial meniscus, current injury, left knee, subsequent encounter 05/04/2017  . Pes planus 02/07/2017  . Laryngopharyngeal reflux (LPR) 07/24/2015  . Post  corneal transplant 07/25/2014  . 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 Benign paroxysmal positional vertigo 08/30/2013  . Adrenal mass, right ([place]) 05/22/2013  . Sleep apnea 07/31/2009  . OBESITY, NOS 03/16/2006    Past Surgical History:  Procedure Laterality Date  . CORNEAL TRANSPLANT  12/2009  . KNEE ARTHROSCOPY  02/20/2012   Procedure: ARTHROSCOPY KNEE;  Surgeon: Alta Corning, MD;  Location: [facility];  Service: Orthopedics;  Laterality: Right;  Chondroplasia patella femoral joint, medial meniscal debriedment        Home Medications    Prior to Admission medications   Medication Sig Start Date End Date Taking? Authorizing Provider  albuterol (PROVENTIL HFA;VENTOLIN HFA) 108 (90 Base) MCG/ACT inhaler Inhale 1-2 puffs into the lungs every 6 (six) hours as needed for wheezing or shortness of breath. 04/09/18   Volanda Napoleon, PA-C  amitriptyline (ELAVIL) 25 MG tablet Take 1 tablet (25 mg total) by mouth at bedtime. 03/21/18   Dickie La, MD  meclizine (ANTIVERT) 25 MG tablet Take 1 tablet (25 mg total) by mouth 3 (three) times daily as needed for dizziness. 01/29/18   Steve Rattler, DO  phenazopyridine (PYRIDIUM) 200 MG tablet Take 1 tablet (200 mg total) by mouth 3 (three) times daily as needed for pain. 03/01/18   Long, Wonda Olds, MD    Family History Family  History  Problem Relation Age of Onset  . Asthma Mother   . Diabetes Father   . Cancer Father        prostate  . Osteoarthritis Sister   . Diabetes Sister     Social History Social History   Tobacco Use  . Smoking status: Never Smoker  . Smokeless tobacco: Never Used  Substance Use Topics  . Alcohol use: No  . Drug use: No     Allergies   Patient has no known allergies.   Review of Systems Review of Systems  Constitutional: Negative for fever.  Respiratory: Positive for cough and shortness of breath.   Cardiovascular: Negative for chest pain.  Gastrointestinal: Negative  for abdominal pain, nausea and vomiting.  Genitourinary: Negative for dysuria and hematuria.  Neurological: Negative for headaches.  All other systems reviewed and are negative.    Physical Exam Updated Vital Signs BP (!) 137/92 (BP Location: Right Wrist)   Pulse 86   Temp 98.7 F (37.1 C) (Oral)   Resp 16   Ht 5\' 9"  (1.753 m)   Wt 125.2 kg   SpO2 100%   BMI 40.76 kg/m   Physical Exam Vitals signs and nursing note reviewed.  Constitutional:      Appearance: He is well-developed.  HENT:     Head: Normocephalic and atraumatic.     Nose: Congestion present.  Eyes:     General: No scleral icterus.       Right eye: No discharge.        Left eye: No discharge.     Conjunctiva/sclera: Conjunctivae normal.  Pulmonary:     Effort: Pulmonary effort is normal.     Comments: Lungs clear to auscultation bilaterally.  Symmetric chest rise.  No wheezing, rales, rhonchi. Skin:    General: Skin is warm and dry.  Neurological:     Mental Status: He is alert.  Psychiatric:        Speech: Speech normal.        Behavior: Behavior normal.      ED Treatments / Results  Labs (all labs ordered are listed, but only abnormal results are displayed) Labs Reviewed - No data to display  EKG None  Radiology Dg Chest 2 View  Result Date: 04/09/2018 CLINICAL DATA:  Cough for 3 days. EXAM: CHEST - 2 VIEW COMPARISON:  10/31/2017 FINDINGS: The heart size and mediastinal contours are within normal limits. Both lungs are clear. The visualized skeletal structures are unremarkable. IMPRESSION: No active cardiopulmonary disease. Electronically Signed   By: Earle Gell M.D.   On: 04/09/2018 18:21    Procedures Procedures (including critical care time)  Medications Ordered in ED Medications  ipratropium-albuterol (DUONEB) 0.5-2.5 (3) MG/3ML nebulizer solution 3 mL (3 mLs Nebulization Given 04/09/18 1826)     Initial Impression / Assessment and Plan / ED Course  I have reviewed the triage  vital signs and the nursing notes.  Pertinent labs & imaging results that were available during my care of the patient were reviewed by me and considered in my medical decision making (see chart for details).        43 year old male who presents for evaluation of cough x1 week along with shortness of breath that began today.  Remote history of asthma but states he is not currently using inhaler denies any recent asthma flares.  Reports he saw daughter about a week ago he was asthmatic at that time but then started developing some URI symptoms.  Since then  has had a cough.  No fever.  He had some chest tightness earlier but states that has improved.  Patient denies any other sick contacts.  No recent travel.  No known exposure to novel coronavirus. Patient is afebrile, non-toxic appearing, sitting comfortably on examination table. Vital signs reviewed and stable. Patient is not hypoxic or tachycardic.  Suspect infectious process such as viral URI/bronchitis.  Low suspicion for pneumonia.  Patient with no obvious wheezing on exam.  History/physical exam not concerning for ACS etiology. Patient is PERC negative and is low risk for PE.   Chest x-ray reviewed.  Negative for any acute infiltrate.  Discussed with patient after nebulizer treatment.  He states he feels like it opened up and he feels better.  We will plan to ambulate with oxygen check.  Patient able to ambulate without any difficulty.  Oxygen levels remained greater than 95% on room air.  Patient states he did have some cough and difficulty breathing at the end but states that improved.  We will plan to treat as viral URI with cough.  Will give patient albuterol inhaler.  At this time, patient does not meet any criteria for COVID 19 testing.  Encourage quarantine measures. At this time, patient exhibits no emergent life-threatening condition that require further evaluation in ED or admission. Patient had ample opportunity for questions and  discussion. All patient's questions were answered with full understanding. Strict return precautions discussed. Patient expresses understanding and agreement to plan.   Portions of this note were generated with [employer]. Dictation errors may occur despite best attempts at proofreading.  Final Clinical Impressions(s) / ED Diagnoses   Final diagnoses:  Viral URI with cough    ED Discharge Orders         Ordered    albuterol (PROVENTIL HFA;VENTOLIN HFA) 108 (90 Base) MCG/ACT inhaler  Every 6 hours PRN     04/09/18 1911           Desma Kerman 04/09/18 1926    Drenda Freeze, MD 04/09/18 [phone]

## 2018-04-09 NOTE — ED Triage Notes (Signed)
Pt c/o cough x 3 days

## 2018-04-09 NOTE — Discharge Instructions (Signed)
You can take Tylenol or Ibuprofen as directed for pain. You can alternate Tylenol and Ibuprofen every 4 hours. If you take Tylenol at 1pm, then you can take Ibuprofen at 5pm. Then you can take Tylenol again at 9pm.   Use Albuterol inhaler as directed.  As we discussed, you should take precaution and quarantine for 2 weeks.  Return emergency department for any high fever despite medications, difficulty breathing or any other worsening or concerning symptoms.

## 2018-04-09 NOTE — ED Notes (Signed)
Ambulated  Quick gait HR 92-100, SpO2 95-100%, endorses moderate SHOB after walk.

## 2018-04-09 NOTE — ED Notes (Signed)
Pt made aware to return if symptoms worsen or if any life threatening symptoms occur.   

## 2018-04-20 ENCOUNTER — Encounter (HOSPITAL_BASED_OUTPATIENT_CLINIC_OR_DEPARTMENT_OTHER): Payer: BC Managed Care – PPO

## 2018-06-18 ENCOUNTER — Other Ambulatory Visit (HOSPITAL_COMMUNITY): Payer: BC Managed Care – PPO

## 2018-06-19 ENCOUNTER — Other Ambulatory Visit: Payer: Self-pay

## 2018-06-19 ENCOUNTER — Other Ambulatory Visit (HOSPITAL_COMMUNITY)
Admission: RE | Admit: 2018-06-19 | Discharge: 2018-06-19 | Disposition: A | Discharge status: Home / Self Care | Payer: BC Managed Care – PPO | Source: Ambulatory Visit | Attending: Internal Medicine | Admitting: Internal Medicine

## 2018-06-19 DIAGNOSIS — Z1159 Encounter for screening for other viral diseases: Secondary | ICD-10-CM | POA: Insufficient documentation

## 2018-06-19 LAB — SARS CORONAVIRUS 2 BY RT PCR (HOSPITAL ORDER, PERFORMED IN ~~LOC~~ HOSPITAL LAB): SARS Coronavirus 2: NEGATIVE

## 2018-06-21 ENCOUNTER — Encounter (INDEPENDENT_AMBULATORY_CARE_PROVIDER_SITE_OTHER): Payer: Self-pay

## 2018-06-21 ENCOUNTER — Other Ambulatory Visit: Payer: Self-pay

## 2018-06-21 ENCOUNTER — Ambulatory Visit (HOSPITAL_BASED_OUTPATIENT_CLINIC_OR_DEPARTMENT_OTHER): Payer: BC Managed Care – PPO | Attending: Family Medicine | Admitting: Internal Medicine

## 2018-06-21 DIAGNOSIS — G4733 Obstructive sleep apnea (adult) (pediatric): Secondary | ICD-10-CM

## 2018-06-21 DIAGNOSIS — G4730 Sleep apnea, unspecified: Secondary | ICD-10-CM | POA: Insufficient documentation

## 2018-06-22 ENCOUNTER — Other Ambulatory Visit: Payer: Self-pay

## 2018-07-01 DIAGNOSIS — G4730 Sleep apnea, unspecified: Secondary | ICD-10-CM

## 2018-07-01 NOTE — Procedures (Signed)
Patient Name: Derrick Scott, Derrick Scott Date: 06/21/2018 Gender: Male D.O.B: 04-20-1975 Age (years): 43 Referring Provider: [name] Height (inches): 12 Interpreting Physician: Baird Lyons MD, ABSM Weight (lbs): 285 RPSGT: Jorge Ny BMI: 42 MRN: [id number] Neck Size: 17.00  CLINICAL INFORMATION Sleep Study Type: Split Night CPAP Indication for sleep study: Obesity, Snoring Epworth Sleepiness Score: 7  SLEEP STUDY TECHNIQUE As per the AASM Manual for the Scoring of Sleep and Associated Events v2.3 (April 2016) with a hypopnea requiring 4% desaturations.  The channels recorded and monitored were frontal, central and occipital EEG, electrooculogram (EOG), submentalis EMG (chin), nasal and oral airflow, thoracic and abdominal wall motion, anterior tibialis EMG, snore microphone, electrocardiogram, and pulse oximetry. Continuous positive airway pressure (CPAP) was initiated when the patient met split night criteria and was titrated according to treat sleep-disordered breathing.  MEDICATIONS Medications self-administered by patient taken the night of the study : none reported  RESPIRATORY PARAMETERS Diagnostic  Total AHI (/hr): 34.4 RDI (/hr): 52.0 OA Index (/hr): 0.4 CA Index (/hr): 0.0 REM AHI (/hr): N/A NREM AHI (/hr): 34.4 Supine AHI (/hr): 28.5 Non-supine AHI (/hr): 41.3 Min O2 Sat (%): 87.0 Mean O2 (%): 94.4 Time below 88% (min): 0.8   Titration  Optimal Pressure (cm): 10 AHI at Optimal Pressure (/hr): 0.0 Min O2 at Optimal Pressure (%): 93.0 Supine % at Optimal (%): 70 Sleep % at Optimal (%): 98   SLEEP ARCHITECTURE The recording time for the entire night was 408 minutes.  During a baseline period of 197.5 minutes, the patient slept for 136.1 minutes in REM and nonREM, yielding a sleep efficiency of 68.9%%. Sleep onset after lights out was 23.4 minutes with a REM latency of N/A minutes. The patient spent 7.0%% of the night in stage N1 sleep, 93.0%% in stage  N2 sleep, 0.0%% in stage N3 and 0% in REM.  During the titration period of 203.4 minutes, the patient slept for 194.0 minutes in REM and nonREM, yielding a sleep efficiency of 95.4%%. Sleep onset after CPAP initiation was 0.7 minutes with a REM latency of 10.0 minutes. The patient spent 1.8%% of the night in stage N1 sleep, 66.5%% in stage N2 sleep, 0.0%% in stage N3 and 31.7% in REM.  CARDIAC DATA The 2 lead EKG demonstrated sinus rhythm. The mean heart rate was 100.0 beats per minute. Other EKG findings include: None.  LEG MOVEMENT DATA The total Periodic Limb Movements of Sleep (PLMS) were 0. The PLMS index was 0.0 .  IMPRESSIONS - Severe obstructive sleep apnea occurred during the diagnostic portion of the study (AHI = 34.4/hour). An optimal PAP pressure was selected for this patient ( 10 cm of water) - No significant central sleep apnea occurred during the diagnostic portion of the study (CAI = 0.0/hour). - Moderate oxygen desaturation was noted during the diagnostic portion of the study (Min O2 =87.0%). - The patient snored with loud snoring volume during the diagnostic portion of the study. - No cardiac abnormalities were noted during this study. - Clinically significant periodic limb movements did not occur during sleep.  DIAGNOSIS - Obstructive Sleep Apnea (327.23 [G47.33 ICD-10])  RECOMMENDATIONS - Trial of CPAP therapy on 10 cm H2O, or autopap 5-15.with - Patient used a Medium size Resmed Full Face Mask AirFit F20 mask and heated humidification. - Be careful with alcohol, sedatives and other CNS depressants that may worsen sleep apnea and disrupt normal sleep architecture. - Sleep hygiene should be reviewed to assess factors that may improve sleep  quality. - Weight management and regular exercise should be initiated or continued.  [Electronically signed] 07/01/2018 11:12 AM  Baird Lyons MD, ABSM Diplomate, American Board of Sleep Medicine   NPI: [id number]                         [place], [place] [facility]  ELECTRONICALLY SIGNED ON:  07/01/2018, 11:11 AM [place] PH: [phone]   FX: [phone] [place]

## 2018-07-10 ENCOUNTER — Telehealth: Payer: Self-pay | Admitting: Family Medicine

## 2018-07-10 NOTE — Telephone Encounter (Signed)
Spoke to pt. He has been given a mask but not the machine. He was told he would hear from someone soon.He doesn't know where the machine is coming from. Pt will call and make an appt with Dr. Nori Riis in 4-6 weeks.  Ottis Stain, CMA

## 2018-07-10 NOTE — Telephone Encounter (Signed)
Dear Derrick Scott Team I received his sleep study results It looks like they went ahead and set up his CPAP and equipment--but you can never be sure Please call him and make sure it IS set up He has SEVERE sleep apnea THANKS!  He should also make an appt to be seen in next 4-6 weeks so we can f/u this and several issues THANKS! [name]

## 2018-07-16 ENCOUNTER — Telehealth: Payer: Self-pay | Admitting: Family Medicine

## 2018-07-16 NOTE — Telephone Encounter (Signed)
Spoke to pt. He is asking if we received paperwork from his job about being out until phase 3 is in effect. (July 17th)  They were supposed to fax it over last week. No notes in the computer as to paperwork being received. Asking PCP if she has seen paperwork. Please advise.  Ottis Stain, CMA

## 2018-07-16 NOTE — Telephone Encounter (Signed)
Pt called about paperwork regarding his job being that pt is high risk for coronavirus. Please give pt a call back.

## 2018-07-17 ENCOUNTER — Telehealth: Payer: Self-pay

## 2018-07-17 DIAGNOSIS — G4730 Sleep apnea, unspecified: Secondary | ICD-10-CM

## 2018-07-17 NOTE — Telephone Encounter (Signed)
Nehemiah Settle Here it is (the order)! THANKS! [name]

## 2018-07-17 NOTE — Telephone Encounter (Signed)
Thank you!! Derrick Scott

## 2018-07-19 ENCOUNTER — Telehealth: Payer: Self-pay

## 2018-07-24 ENCOUNTER — Telehealth: Payer: Self-pay | Admitting: *Deleted

## 2018-07-24 NOTE — Telephone Encounter (Signed)
[facility] needs the CPAP order to notate the settings.  Pts insurance will not accept auto titration.  Lenna Sciara says that the typical settings are 4-20.   If MD agrees with this please resubmit order and community message Melissa when completed.  Christen Bame, CMA

## 2018-07-27 ENCOUNTER — Other Ambulatory Visit: Payer: Self-pay | Admitting: Family Medicine

## 2018-07-27 DIAGNOSIS — G4730 Sleep apnea, unspecified: Secondary | ICD-10-CM

## 2018-07-29 NOTE — Telephone Encounter (Signed)
Left signed order to be scanned

## 2018-08-02 ENCOUNTER — Other Ambulatory Visit: Payer: Self-pay

## 2018-08-02 ENCOUNTER — Telehealth (INDEPENDENT_AMBULATORY_CARE_PROVIDER_SITE_OTHER): Payer: BC Managed Care – PPO | Admitting: Family Medicine

## 2018-08-02 DIAGNOSIS — J029 Acute pharyngitis, unspecified: Secondary | ICD-10-CM | POA: Diagnosis not present

## 2018-08-02 MED ORDER — PANTOPRAZOLE SODIUM 20 MG PO TBEC: 20.0000 mg | DELAYED_RELEASE_TABLET | Freq: Every day | ORAL | 0 refills | Status: DC

## 2018-08-02 MED ORDER — AMOXICILLIN 500 MG PO CAPS: 500.0000 mg | ORAL_CAPSULE | Freq: Two times a day (BID) | ORAL | 0 refills | Status: AC

## 2018-08-02 NOTE — Progress Notes (Signed)
[place] Telemedicine Visit I connected with  Derrick Scott on 08/02/18 by a video enabled telemedicine application and verified that I am speaking with the correct person using two identifiers.   I discussed the limitations of evaluation and management by telemedicine. The patient expressed understanding and agreed to proceed.  Patient consented to have virtual visit. Method of visit: Video  Encounter participants: Patient: Derrick Scott - located at Home Provider: Caroline More - located at [facility] Others (if applicable): None  Chief Complaint: Sore throat   HPI: Sore throat Onset: 3-4 days ago Severity: 4/10 Better with: none Patient reports that he has a h/o indigestion. Thought something was "in his throat".  Has not tried a salt water gargle. Has been just taking Vit C, Elderberry, and B12  Feels like his voice goes hoarse sometimes  Has tried pepcid but didn't work  Symptoms  Fever: no    Cough/URI sxs: yes, cough every once in a while\ Neck tenderness: yes  Myalgias: no Headache: yes, occasionally  Rash: no Swollen neck glands: no    Recent Strep Exposure: no LUQ pain: no Heartburn/brash: no Allergy Sxs: no  Red Flags STD exposure: no Breathing difficulty: no Drooling: no Trismus: no  ROS: per HPI  Pertinent PMHx: laryngopharyngeal reflux  Exam:  Respiratory: speaking full sentences, no respiratory distress, no increased WOIB  Throat: tonsillar swelling with probable exudates, erythema to oropharynx  Assessment/Plan:  Sore throat Patient with sore throat x3 to 4 days.  Differentials include strep pharyngitis, reflux, allergic.  Unlikely COVID as patient has no other symptoms at this time and has not had exposure to others who are ill.  Throat exam via video chat showing tonsillar swelling with likely exudates.  Centor criteria 2.  Unable to do culture as this is a video visit.  Given concerning exam we will plan to  give Rx for amoxicillin to treat for strep throat.  Strict return precautions given.  Advised to follow-up if no improvement with antibiotics.  Can also consider reflux as patient has a history of this.  Will prescribe Protonix as he has failed outpatient Pepcid in the past.  Advised to follow-up in 2 weeks we can see how he is doing on Protonix.  Strict return precautions given.  Advised to go to the emergency room if developing fevers, drooling, difficulty breathing.    Time spent during visit with patient: 15 minutes

## 2018-08-02 NOTE — Assessment & Plan Note (Signed)
Patient with sore throat x3 to 4 days.  Differentials include strep pharyngitis, reflux, allergic.  Unlikely COVID as patient has no other symptoms at this time and has not had exposure to others who are ill.  Throat exam via video chat showing tonsillar swelling with likely exudates.  Centor criteria 2.  Unable to do culture as this is a video visit.  Given concerning exam we will plan to give Rx for amoxicillin to treat for strep throat.  Strict return precautions given.  Advised to follow-up if no improvement with antibiotics.  Can also consider reflux as patient has a history of this.  Will prescribe Protonix as he has failed outpatient Pepcid in the past.  Advised to follow-up in 2 weeks we can see how he is doing on Protonix.  Strict return precautions given.  Advised to go to the emergency room if developing fevers, drooling, difficulty breathing.

## 2018-08-19 ENCOUNTER — Other Ambulatory Visit: Payer: Self-pay

## 2018-08-19 ENCOUNTER — Emergency Department (HOSPITAL_COMMUNITY): Payer: BC Managed Care – PPO

## 2018-08-19 ENCOUNTER — Encounter (HOSPITAL_COMMUNITY): Payer: Self-pay | Admitting: Emergency Medicine

## 2018-08-19 ENCOUNTER — Emergency Department (HOSPITAL_COMMUNITY)
Admission: EM | Admit: 2018-08-19 | Discharge: 2018-08-20 | Disposition: A | Discharge status: Home / Self Care | Payer: BC Managed Care – PPO | Attending: Emergency Medicine | Admitting: Emergency Medicine

## 2018-08-19 DIAGNOSIS — Z79899 Other long term (current) drug therapy: Secondary | ICD-10-CM | POA: Diagnosis not present

## 2018-08-19 DIAGNOSIS — R0789 Other chest pain: Secondary | ICD-10-CM | POA: Insufficient documentation

## 2018-08-19 LAB — URINALYSIS, ROUTINE W REFLEX MICROSCOPIC
Bacteria, UA: NONE SEEN
Bilirubin Urine: NEGATIVE
Glucose, UA: NEGATIVE mg/dL
Hgb urine dipstick: NEGATIVE
Ketones, ur: NEGATIVE mg/dL
Nitrite: NEGATIVE
Protein, ur: NEGATIVE mg/dL
Specific Gravity, Urine: 1.024 (ref 1.005–1.030)
pH: 5 (ref 5.0–8.0)

## 2018-08-19 MED ORDER — KETOROLAC TROMETHAMINE 30 MG/ML IJ SOLN
30.0000 mg | Freq: Once | INTRAMUSCULAR | Status: AC
  Administered 2018-08-19: 30 mg via INTRAVENOUS
  Filled 2018-08-19: qty 1

## 2018-08-19 NOTE — ED Provider Notes (Signed)
[facility] DEPT Provider Note   CSN: [id number] Arrival date & time: 08/19/18  2142    History   Chief Complaint Chief Complaint  Patient presents with  . Back Pain  . Flank Pain    HPI Derrick Scott is a 43 y.o. male.   The history is provided by the patient.  He has history of obesity and comes in complaining of sharp pain in the left upper back which radiates around to the left lateral and anterior chest wall.  Pain is severe and he rates it at 9/10.  It is worse with taking a deep breath and also with movement.  Nothing makes it better.  He did try taking a dose of acetaminophen without relief.  There is associated shortness of breath but he denies nausea or diaphoresis.  He denies cough.  He denies trauma.  He denies fever chills.  He denies sore throat, loss of sense of smell or taste, diarrhea.  He denies pain anywhere else.  He denies exposure to COVID-19.  He is a non-smoker and denies history of hypertension, diabetes, hyperlipidemia and he denies family history of premature coronary atherosclerosis.  Past Medical History:  Diagnosis Date  . Medial meniscus tear 01/2012   right knee  . Obesity   . Pneumonia   . Renal disorder   . Vertigo, benign paroxysmal 2015    Patient Active Problem List   Diagnosis Date Noted  . Sore throat 08/02/2018  . Urethritis 03/06/2018  . 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 Other tear of medial meniscus, current injury, left knee, subsequent encounter 05/04/2017  . Pes planus 02/07/2017  . Laryngopharyngeal reflux (LPR) 07/24/2015  . Post corneal transplant 07/25/2014  . 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 Benign paroxysmal positional vertigo 08/30/2013  . Adrenal mass, right ([place]) 05/22/2013  . Sleep apnea 07/31/2009  . OBESITY, NOS 03/16/2006    Past Surgical History:  Procedure Laterality Date  . CORNEAL TRANSPLANT  12/2009  . KNEE  ARTHROSCOPY  02/20/2012   Procedure: ARTHROSCOPY KNEE;  Surgeon: Alta Corning, MD;  Location: [place];  Service: Orthopedics;  Laterality: Right;  Chondroplasia patella femoral joint, medial meniscal debriedment        Home Medications    Prior to Admission medications   Medication Sig Start Date End Date Taking? Authorizing Provider  albuterol (PROVENTIL HFA;VENTOLIN HFA) 108 (90 Base) MCG/ACT inhaler Inhale 1-2 puffs into the lungs every 6 (six) hours as needed for wheezing or shortness of breath. 04/09/18   Volanda Napoleon, PA-C  amitriptyline (ELAVIL) 25 MG tablet Take 1 tablet (25 mg total) by mouth at bedtime. 03/21/18   Dickie La, MD  meclizine (ANTIVERT) 25 MG tablet Take 1 tablet (25 mg total) by mouth 3 (three) times daily as needed for dizziness. 01/29/18   Steve Rattler, DO  pantoprazole (PROTONIX) 20 MG tablet Take 1 tablet (20 mg total) by mouth daily. 08/02/18   Caroline More, DO  phenazopyridine (PYRIDIUM) 200 MG tablet Take 1 tablet (200 mg total) by mouth 3 (three) times daily as needed for pain. 03/01/18   Long, Wonda Olds, MD    Family History Family History  Problem Relation Age of Onset  . Asthma Mother   . Diabetes Father   . Cancer Father        prostate  . Osteoarthritis Sister   . Diabetes Sister  Social History Social History   Tobacco Use  . Smoking status: Never Smoker  . Smokeless tobacco: Never Used  Substance Use Topics  . Alcohol use: No  . Drug use: No     Allergies   Patient has no known allergies.   Review of Systems Review of Systems  All other systems reviewed and are negative.    Physical Exam Updated Vital Signs BP (!) 143/95   Pulse 80   Temp 98.6 F (37 C) (Oral)   Resp 16   SpO2 98%   Physical Exam Vitals signs and nursing note reviewed.    Morbidly obese 42 year old male, resting comfortably and in no acute distress. Vital signs are significant for elevated blood pressure. Oxygen  saturation is 96%, which is normal. Head is normocephalic and atraumatic. PERRLA, EOMI. Oropharynx is clear. Neck is nontender and supple without adenopathy or JVD. Back is nontender in the midline.  There is mild tenderness just lateral to the left scapula without crepitus.  There is no CVA tenderness. Lungs are clear without rales, wheezes, or rhonchi. Chest is nontender. Heart has regular rate and rhythm without murmur. Abdomen is soft, flat, nontender without masses or hepatosplenomegaly and peristalsis is normoactive. Extremities have no cyanosis or edema, full range of motion is present. Skin is warm and dry without rash. Neurologic: Mental status is normal, cranial nerves are intact, there are no motor or sensory deficits.  ED Treatments / Results  Labs (all labs ordered are listed, but only abnormal results are displayed) Labs Reviewed  URINALYSIS, ROUTINE W REFLEX MICROSCOPIC - Abnormal; Notable for the following components:      Result Value   Leukocytes,Ua TRACE (*)    All other components within normal limits  BASIC METABOLIC PANEL - Abnormal; Notable for the following components:   Glucose, Bld 124 (*)    All other components within normal limits  CBC WITH DIFFERENTIAL/PLATELET  D-DIMER, QUANTITATIVE (NOT AT [facility])  TROPONIN I (HIGH SENSITIVITY)  TROPONIN I (HIGH SENSITIVITY)    EKG EKG Interpretation  Date/Time:  Monday August 20 2018 00:00:35 EDT Ventricular Rate:  81 PR Interval:    QRS Duration: 94 QT Interval:  360 QTC Calculation: 418 R Axis:   -50 Text Interpretation:  Sinus rhythm Left anterior fascicular block Abnormal R-wave progression, early transition Borderline T wave abnormalities When compared with ECG of 10/31/2017, No significant change was found Confirmed by Delora Fuel ([id number]) on 08/20/2018 12:07:17 AM   Radiology Dg Chest 2 View  Result Date: 08/20/2018 CLINICAL DATA:  Chest pain EXAM: CHEST - 2 VIEW COMPARISON:  04/09/2018 chest radiograph  FINDINGS: The heart size and mediastinal contours are within normal limits. Both lungs are clear. The visualized skeletal structures are unremarkable. IMPRESSION: No active cardiopulmonary disease. Electronically Signed   By: Ulyses Jarred M.D.   On: 08/20/2018 00:47    Procedures Procedures  Medications Ordered in ED Medications  ketorolac (TORADOL) 30 MG/ML injection 30 mg (30 mg Intravenous Given 08/19/18 2355)     Initial Impression / Assessment and Plan / ED Course  I have reviewed the triage vital signs and the nursing notes.  Pertinent labs & imaging results that were available during my care of the patient were reviewed by me and considered in my medical decision making (see chart for details).  Pleuritic chest pain which sounds most likely to be musculoskeletal.  Old records are reviewed, and he has 2 prior ED visits with similar pain.  However, because of  pleuritic nature of pain, will screen for pulmonary embolism with d-dimer.  We will also check troponin and ECG.  He will be given a therapeutic trial of ketorolac.  ECG is unchanged from baseline.  Chest x-ray shows no evidence of pneumonia.  Troponin and d-dimer are normal.  He had good relief of pain with ketorolac.  No indication of anything more serious than musculoskeletal pain at this point.  He is discharged with instructions to apply ice and use over-the-counter NSAIDs and acetaminophen.  Return precautions discussed.  Final Clinical Impressions(s) / ED Diagnoses   Final diagnoses:  Muscular chest pain    ED Discharge Orders    None       Delora Fuel, MD 44/46/19 [place] [place]

## 2018-08-19 NOTE — ED Triage Notes (Signed)
Patient here from home with complaints of mid back pain and left sided flank pain radiating into abdomen that started this morning. Denies n/v .

## 2018-08-20 ENCOUNTER — Emergency Department (HOSPITAL_COMMUNITY): Payer: BC Managed Care – PPO

## 2018-08-20 LAB — BASIC METABOLIC PANEL
Anion gap: 7 (ref 5–15)
BUN: 14 mg/dL (ref 6–20)
CO2: 25 mmol/L (ref 22–32)
Calcium: 8.9 mg/dL (ref 8.9–10.3)
Chloride: 107 mmol/L (ref 98–111)
Creatinine, Ser: 1.12 mg/dL (ref 0.61–1.24)
GFR calc Af Amer: >60 mL/min (ref >60)
GFR calc non Af Amer: >60 mL/min (ref >60)
Glucose, Bld: 124 mg/dL — ABNORMAL HIGH (ref 70–99)
Potassium: 4.1 mmol/L (ref 3.5–5.1)
Sodium: 139 mmol/L (ref 135–145)

## 2018-08-20 LAB — CBC WITH DIFFERENTIAL/PLATELET
Abs Immature Granulocytes: 0.02 10*3/uL (ref 0.00–0.07)
Basophils Absolute: 0.1 10*3/uL (ref 0.0–0.1)
Basophils Relative: 1 %
Eosinophils Absolute: 0.1 10*3/uL (ref 0.0–0.5)
Eosinophils Relative: 1 %
HCT: 44.7 % (ref 39.0–52.0)
Hemoglobin: 14.8 g/dL (ref 13.0–17.0)
Immature Granulocytes: 0 %
Lymphocytes Relative: 41 %
Lymphs Abs: 3.1 10*3/uL (ref 0.7–4.0)
MCH: 29.5 pg (ref 26.0–34.0)
MCHC: 33.1 g/dL (ref 30.0–36.0)
MCV: 89 fL (ref 80.0–100.0)
Monocytes Absolute: 0.5 10*3/uL (ref 0.1–1.0)
Monocytes Relative: 6 %
Neutro Abs: 3.7 10*3/uL (ref 1.7–7.7)
Neutrophils Relative %: 51 %
Platelets: 276 10*3/uL (ref 150–400)
RBC: 5.02 MIL/uL (ref 4.22–5.81)
RDW: 13.2 % (ref 11.5–15.5)
WBC: 7.4 10*3/uL (ref 4.0–10.5)
nRBC: 0 % (ref 0.0–0.2)

## 2018-08-20 LAB — TROPONIN I (HIGH SENSITIVITY)
Troponin I (High Sensitivity): 3 ng/L (ref <18)
Troponin I (High Sensitivity): 3 ng/L (ref <18)

## 2018-08-20 LAB — D-DIMER, QUANTITATIVE: D-Dimer, Quant: <0.27 ug/mL-FEU (ref 0.00–0.50)

## 2018-08-20 NOTE — Discharge Instructions (Signed)
Apply ice as needed.  Take ibuprofen and/or acetaminophen as needed for pain.

## 2018-11-20 NOTE — Telephone Encounter (Signed)
error 

## 2018-12-05 ENCOUNTER — Emergency Department (HOSPITAL_BASED_OUTPATIENT_CLINIC_OR_DEPARTMENT_OTHER)
Admission: EM | Admit: 2018-12-05 | Discharge: 2018-12-05 | Disposition: A | Discharge status: Home / Self Care | Payer: BC Managed Care – PPO | Attending: Emergency Medicine | Admitting: Emergency Medicine

## 2018-12-05 ENCOUNTER — Other Ambulatory Visit: Payer: Self-pay

## 2018-12-05 ENCOUNTER — Emergency Department (HOSPITAL_BASED_OUTPATIENT_CLINIC_OR_DEPARTMENT_OTHER): Payer: BC Managed Care – PPO

## 2018-12-05 ENCOUNTER — Encounter (HOSPITAL_BASED_OUTPATIENT_CLINIC_OR_DEPARTMENT_OTHER): Payer: Self-pay

## 2018-12-05 DIAGNOSIS — U071 COVID-19: Secondary | ICD-10-CM | POA: Diagnosis not present

## 2018-12-05 DIAGNOSIS — R05 Cough: Secondary | ICD-10-CM | POA: Diagnosis present

## 2018-12-05 DIAGNOSIS — J069 Acute upper respiratory infection, unspecified: Secondary | ICD-10-CM

## 2018-12-05 MED ORDER — NAPROXEN 500 MG PO TABS: 500.0000 mg | ORAL_TABLET | Freq: Two times a day (BID) | ORAL | 0 refills | Status: DC | PRN

## 2018-12-05 NOTE — ED Triage Notes (Signed)
Pt with flu like sx x 1 week with +covid exposure-pt had neg covid this week-NAD-steady gait

## 2018-12-05 NOTE — Discharge Instructions (Addendum)
Please read the attachment on URI.  Please take the prescription naproxen, as directed.  Return to the ED or seek medical attention should he develop any worsening shortness of breath symptoms, difficulty breathing, high fevers uncontrolled with Tylenol or ibuprofen, uncontrolled nausea or vomiting, or any other new or worsening symptoms.

## 2018-12-05 NOTE — ED Provider Notes (Signed)
[facility] EMERGENCY DEPARTMENT Provider Note   CSN: [id number] Arrival date & time: 12/05/18  1504     History   Chief Complaint Chief Complaint  Patient presents with   Cough    HPI Derrick Scott is a 43 y.o. male with PMH significant for OSA and obesity presents to the ED with a 1 week history of cough, headache, and body aches.  Yesterday he also developed a fever which she states persisted into today.  His fiance has already tested positive for COVID-19 and he is concerned that his initial test which was completed last week was a false negative.  He states that it was a saliva test.  He is also mildly concerned that his new onset fever may suggest a pneumonia.  He denies any dizziness, chest pain, shortness of breath, nausea or vomiting, abdominal pain, change in bowel habits, or urinary symptoms.  He has been taking elderberry, vitamin C, vitamin B, and other vitamin supplements for symptomatic relief.     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 Authorizing Provider  albuterol (PROVENTIL HFA;VENTOLIN HFA) 108 (90 Base) MCG/ACT inhaler Inhale 1-2 puffs into the lungs every 6 (six) hours as needed for wheezing or shortness of breath. 04/09/18   [place] Lanius A, PA-C  naproxen (NAPROSYN) 500 MG tablet Take 1 tablet (500 mg total) by mouth 2 (two) times daily as needed for moderate pain (Or fevers / body aches). 12/05/18   Corena Herter, PA-C    Family History Family History  Problem Relation Age of Onset   Asthma Mother    Diabetes Father    Cancer Father        prostate   Osteoarthritis Sister    Diabetes Sister     Social History Social History   Tobacco Use   Smoking status: Never Smoker   Smokeless tobacco: Never Used  Substance Use Topics   Alcohol use: No   Drug use: No     Allergies   Patient has no known allergies.   Review of Systems Review of Systems  All other systems reviewed and are negative.    Physical Exam Updated Vital Signs BP 135/84 (BP Location: Left Arm)    Pulse (!) 111    Temp 99.9 F (37.7 C) (Oral)  Resp 16    Ht 5\' 9"  (1.753 m)    Wt 130.6 kg    SpO2 99%    BMI 42.53 kg/m   Physical Exam Vitals signs and nursing note reviewed. Exam conducted with a chaperone present.  Constitutional:      Appearance: Normal appearance.  HENT:     Head: Normocephalic and atraumatic.  Eyes:     General: No scleral icterus.    Conjunctiva/sclera: Conjunctivae normal.  Neck:     Musculoskeletal: Normal range of motion and neck supple.  Cardiovascular:     Rate and Rhythm: Normal rate and regular rhythm.     Pulses: Normal pulses.     Heart sounds: Normal heart sounds.  Pulmonary:     Effort: Pulmonary effort is normal. No respiratory distress.     Breath  sounds: Normal breath sounds. No wheezing or rales.  Abdominal:     General: Abdomen is flat. There is no distension.     Palpations: Abdomen is soft.     Tenderness: There is no abdominal tenderness. There is no guarding.  Skin:    General: Skin is dry.  Neurological:     Mental Status: He is alert and oriented to person, place, and time.     GCS: GCS eye subscore is 4. GCS verbal subscore is 5. GCS motor subscore is 6.  Psychiatric:        Mood and Affect: Mood normal.        Behavior: Behavior normal.        Thought Content: Thought content normal.      ED Treatments / Results  Labs (all labs ordered are listed, but only abnormal results are displayed) Labs Reviewed  SARS CORONAVIRUS 2 (TAT 6-24 HRS)    EKG None  Radiology Dg Chest Portable 1 View  Result Date: 12/05/2018 CLINICAL DATA:  Cough for 1 week, history of COVID-19 exposure but negative testing, initial encounter EXAM: PORTABLE CHEST 1 VIEW COMPARISON:  08/20/2018 FINDINGS: Cardiac shadow is enlarged but accentuated by the portable technique. The lungs are well aerated bilaterally. No focal infiltrate or sizable effusion is seen. No bony abnormality is noted. IMPRESSION: No active disease. Electronically Signed   By: Inez Catalina M.D.   On: 12/05/2018 17:23    Procedures Procedures (including critical care time)  Medications Ordered in ED Medications - No data to display   Initial Impression / Assessment and Plan / ED Course  I have reviewed the triage vital signs and the nursing notes.  Pertinent labs & imaging results that were available during my care of the patient were reviewed by me and considered in my medical decision making (see chart for details).        DG chest portable demonstrated no evidence of pneumothorax, consolidation concerning for pneumonia, or other active cardiopulmonary disease.  Patient's history and physical exam is consistent with an upper respiratory infection, likely viral  etiology.  Will obtain repeat COVID-19 testing given his report of progressively worsening symptoms and positive COVID-19 fiance.  Given the chronicity of his symptoms, obtaining influenza testing would not change management so we mutually decided not to pursue testing.  Patient is instructed to return to the ED or seek medical attention should he develop any worsening shortness of breath symptoms, difficulty breathing, high fevers uncontrolled with Tylenol or ibuprofen, uncontrolled nausea or vomiting, or any other new or worsening symptoms.  Prescribed patient naproxen to take in addition to Tylenol, as needed, for any body aches  or fevers.  Discussed conservative treatment options.  Patient voiced understanding and is agreeable to plan.  Derrick Scott was evaluated in Emergency Department on 12/05/2018 for the symptoms described in the history of present illness. He was evaluated in the context of the global COVID-19 pandemic, which necessitated consideration that the patient might be at risk for infection with the SARS-CoV-2 virus that causes COVID-19. Institutional protocols and algorithms that pertain to the evaluation of patients at risk for COVID-19 are in a state of rapid change based on information released by regulatory bodies including the CDC and federal and state organizations. These policies and algorithms were followed during the patient's care in the ED.   Final Clinical Impressions(s) / ED Diagnoses   Final diagnoses:  Viral upper respiratory tract infection    ED Discharge Orders         Ordered    naproxen (NAPROSYN) 500 MG tablet  2 times daily PRN     12/05/18 1737           Corena Herter, PA-C 12/05/18 1742    Fredia Sorrow, MD 12/09/18 [phone]

## 2018-12-07 ENCOUNTER — Telehealth: Payer: Self-pay | Admitting: *Deleted

## 2018-12-07 LAB — NOVEL CORONAVIRUS, NAA (HOSP ORDER, SEND-OUT TO REF LAB; TAT 18-24 HRS): SARS-CoV-2, NAA: DETECTED — AB

## 2018-12-07 NOTE — Telephone Encounter (Signed)
Patient called for covid results ,advised to call back later ,results are still pending.

## 2018-12-10 ENCOUNTER — Emergency Department (HOSPITAL_BASED_OUTPATIENT_CLINIC_OR_DEPARTMENT_OTHER)
Admission: EM | Admit: 2018-12-10 | Discharge: 2018-12-10 | Disposition: A | Discharge status: Home / Self Care | Payer: BC Managed Care – PPO | Attending: Emergency Medicine | Admitting: Emergency Medicine

## 2018-12-10 ENCOUNTER — Emergency Department (HOSPITAL_BASED_OUTPATIENT_CLINIC_OR_DEPARTMENT_OTHER): Payer: BC Managed Care – PPO

## 2018-12-10 ENCOUNTER — Encounter (HOSPITAL_BASED_OUTPATIENT_CLINIC_OR_DEPARTMENT_OTHER): Payer: Self-pay | Admitting: *Deleted

## 2018-12-10 ENCOUNTER — Other Ambulatory Visit: Payer: Self-pay

## 2018-12-10 ENCOUNTER — Other Ambulatory Visit: Payer: Self-pay | Admitting: Family Medicine

## 2018-12-10 DIAGNOSIS — J189 Pneumonia, unspecified organism: Secondary | ICD-10-CM | POA: Diagnosis not present

## 2018-12-10 DIAGNOSIS — U071 COVID-19: Secondary | ICD-10-CM | POA: Diagnosis not present

## 2018-12-10 DIAGNOSIS — R0602 Shortness of breath: Secondary | ICD-10-CM | POA: Diagnosis present

## 2018-12-10 DIAGNOSIS — J1282 Pneumonia due to coronavirus disease 2019: Secondary | ICD-10-CM

## 2018-12-10 MED ORDER — AZITHROMYCIN 250 MG PO TABS: 250.0000 mg | ORAL_TABLET | Freq: Every day | ORAL | 0 refills | Status: DC

## 2018-12-10 MED ORDER — AZITHROMYCIN 250 MG PO TABS
500.0000 mg | ORAL_TABLET | Freq: Once | ORAL | Status: AC
  Administered 2018-12-10: 500 mg via ORAL
  Filled 2018-12-10: qty 2

## 2018-12-10 MED ORDER — ACETAMINOPHEN-CODEINE 300-30 MG PO TABS: 1.0000 | ORAL_TABLET | Freq: Four times a day (QID) | ORAL | 0 refills | Status: AC | PRN

## 2018-12-10 MED ORDER — DEXAMETHASONE SODIUM PHOSPHATE 10 MG/ML IJ SOLN
10.0000 mg | Freq: Once | INTRAMUSCULAR | Status: AC
  Administered 2018-12-10: 10 mg via INTRAVENOUS
  Filled 2018-12-10: qty 1

## 2018-12-10 MED ORDER — AZITHROMYCIN 250 MG PO TABS: ORAL_TABLET | ORAL | 0 refills | Status: DC

## 2018-12-10 MED ORDER — PREDNISONE 10 MG (21) PO TBPK: ORAL_TABLET | ORAL | 0 refills | Status: DC

## 2018-12-10 MED ORDER — IOHEXOL 350 MG/ML SOLN
80.0000 mL | Freq: Once | INTRAVENOUS | Status: AC | PRN
  Administered 2018-12-10: 80 mL via INTRAVENOUS

## 2018-12-10 NOTE — ED Notes (Signed)
X-ray at bedside

## 2018-12-10 NOTE — ED Provider Notes (Signed)
[facility] Provider Note   CSN: [id number] Arrival date & time: 12/10/18  0522     History   Chief Complaint Chief Complaint  Patient presents with  . sob-covid    HPI [facility] is a 43 y.o. male.     HPI  This is a 43 year old male who presents with shortness of breath.  Patient reports positive Covid test on Friday of last week.  His fiance also has Covid.  He has had symptoms for approximately 1 week.  He has noted a slight increase in shortness of breath.  He states that it comes and goes and is often exertional.  He denies any chest pain to me but reportedly told the triage nurse that he had pain with inspiration.  He has not noted any lower extremity swelling.  He has not had any recent fevers but does report persistent dry cough.  He states "I just wanted to make sure that my chest was still clear."  Denies any nausea, vomiting, diarrhea.   Past Medical History:  Diagnosis Date  . Medial meniscus tear 01/2012   right knee  . Obesity   . Pneumonia   . Renal disorder   . Vertigo, benign paroxysmal 2015    Patient Active Problem List   Diagnosis Date Noted  . Sore throat 08/02/2018  . Urethritis 03/06/2018  . 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 Other tear of medial meniscus, current injury, left knee, subsequent encounter 05/04/2017  . Pes planus 02/07/2017  . Laryngopharyngeal reflux (LPR) 07/24/2015  . Post corneal transplant 07/25/2014  . 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 Benign paroxysmal positional vertigo 08/30/2013  . Adrenal mass, right ([place]) 05/22/2013  . Sleep apnea 07/31/2009  . OBESITY, NOS 03/16/2006    Past Surgical History:  Procedure Laterality Date  . CORNEAL TRANSPLANT  12/2009  . KNEE ARTHROSCOPY  02/20/2012   Procedure: ARTHROSCOPY KNEE;  Surgeon: Alta Corning, MD;  Location: [place];  Service: Orthopedics;  Laterality:  Right;  Chondroplasia patella femoral joint, medial meniscal debriedment        Home Medications    Prior to Admission medications   Medication Sig Start Date End Date Taking? Authorizing Provider  albuterol (PROVENTIL HFA;VENTOLIN HFA) 108 (90 Base) MCG/ACT inhaler Inhale 1-2 puffs into the lungs every 6 (six) hours as needed for wheezing or shortness of breath. 04/09/18   Providence Lanius A, PA-C  naproxen (NAPROSYN) 500 MG tablet Take 1 tablet (500 mg total) by mouth 2 (two) times daily as needed for moderate pain (Or fevers / body aches). 12/05/18   Corena Herter, PA-C    Family History Family History  Problem Relation Age of Onset  . Asthma Mother   . Diabetes Father   . Cancer Father        prostate  . Osteoarthritis Sister   . Diabetes Sister     Social History Social History   Tobacco Use  . Smoking status: Never Smoker  . Smokeless tobacco: Never Used  Substance Use Topics  . Alcohol use: No  . Drug use: No     Allergies   Patient has no known allergies.   Review of Systems Review of Systems  Constitutional: Negative for fever.  Respiratory: Positive for shortness of breath. Negative for cough.   Cardiovascular: Negative for chest pain and leg swelling.  Gastrointestinal: Negative for abdominal pain, nausea and vomiting.  Genitourinary: Negative for dysuria.  All other systems reviewed and are negative.    Physical Exam Updated Vital Signs BP (!) 144/98 (BP Location: Right Arm)   Pulse 91   Temp 98.4 F (36.9 C)   Resp 20   Ht 1.753 m (5\' 9" )   Wt 130.6 kg   SpO2 97%   BMI 42.53 kg/m   Physical Exam Vitals signs and nursing note reviewed.  Constitutional:      Appearance: He is well-developed. He is obese.  HENT:     Head: Normocephalic and atraumatic.     Mouth/Throat:     Mouth: Mucous membranes are moist.  Eyes:     Pupils: Pupils are equal, round, and reactive to light.  Neck:     Musculoskeletal: Neck supple.  Cardiovascular:      Rate and Rhythm: Normal rate and regular rhythm.     Heart sounds: Normal heart sounds. No murmur.  Pulmonary:     Effort: Pulmonary effort is normal. No respiratory distress.     Breath sounds: Normal breath sounds. No wheezing or rales.  Abdominal:     General: Bowel sounds are normal.     Palpations: Abdomen is soft.     Tenderness: There is no abdominal tenderness. There is no rebound.  Musculoskeletal:     Right lower leg: No edema.     Left lower leg: No edema.  Lymphadenopathy:     Cervical: No cervical adenopathy.  Skin:    General: Skin is warm and dry.  Neurological:     Mental Status: He is alert and oriented to person, place, and time.  Psychiatric:        Mood and Affect: Mood normal.      ED Treatments / Results  Labs (all labs ordered are listed, but only abnormal results are displayed) Labs Reviewed - No data to display  EKG EKG Interpretation  Date/Time:  Monday December 10 2018 05:31:44 EST Ventricular Rate:  92 PR Interval:    QRS Duration: 107 QT Interval:  352 QTC Calculation: 436 R Axis:   -44 Text Interpretation: Normal sinus rhythm Left axis deviation Abnormal R-wave progression, late transition Confirmed by Thayer Jew [phone]) on 12/10/2018 5:51:45 AM   Radiology No results found.  Procedures Procedures (including critical care time)  Medications Ordered in ED Medications - No data to display   Initial Impression / Assessment and Plan / ED Course  I have reviewed the triage vital signs and the nursing notes.  Pertinent labs & imaging results that were available during my care of the patient were reviewed by me and considered in my medical decision making (see chart for details).        Patient presents with shortness of breath.  Recent COVID-19 diagnosis.  He reported some pleuritic chest pain to the nurse but denies to me.  He is overall nontoxic-appearing vital signs are reassuring.  I have confirmed he recently had a  Covid positive test.  EKG is nonischemic.  Chest x-ray concerning for possible pneumonia.  Given description of pain and Covid positivity, blood clot would also be consideration.  He would fall into the medium to high risk category.  CT was obtained to rule out blood clot.  This is pending at time of signout.  Final Clinical Impressions(s) / ED Diagnoses   Final diagnoses:  COVID-19  SOB (shortness of breath)  Pneumonia due to COVID-19 virus    ED Discharge Orders    None  Merryl Hacker, MD 12/11/18 0111

## 2018-12-10 NOTE — Progress Notes (Unsigned)
Left me message he has COVID (+ test results). Cough is bad. Will call in some tylenol #3.   Dear [name] Team Please let him know I have sent rx for cough THANKS! [name]

## 2018-12-10 NOTE — ED Notes (Signed)
Pt provided urinal.

## 2018-12-10 NOTE — Progress Notes (Unsigned)
Pt informed. He was given some prednisone and azithromycin at the hospital. Pt appreciates something for the cough.Ottis Stain, CMA

## 2018-12-10 NOTE — ED Provider Notes (Signed)
Pt signed out by Dr. Dina Rich pending CT chest.    IMPRESSION:  1. No evidence of pulmonary embolus.  2. Scattered bilateral ground-glass nodular opacities with a lower  lobe predominance, likely reflecting COVID pneumonia given provided  history.  3. Hepatic steatosis.   Pt given zithromax and prednisone.  He knows to return if his sx worsen.  He is given a note for work and told to self-isolate.   [facility] Pence, MD 12/10/18 [phone]

## 2018-12-10 NOTE — ED Triage Notes (Signed)
Pt c/o feeling sob. States he had a covid positive test on Friday. Last temp a few days ago. Pain to chest with inspiration

## 2018-12-10 NOTE — ED Notes (Signed)
Pt called out saying "his IV was bleeding"- RN immediately entered room and pt was standing up with a small pool of blood on floor. IV hub was not intact. IV re secured and taped down. IV flushed with normal saline.

## 2018-12-10 NOTE — ED Notes (Signed)
Patient transported to CT 

## 2018-12-11 ENCOUNTER — Other Ambulatory Visit: Payer: Self-pay | Admitting: Critical Care Medicine

## 2018-12-11 ENCOUNTER — Encounter (HOSPITAL_BASED_OUTPATIENT_CLINIC_OR_DEPARTMENT_OTHER): Payer: Self-pay

## 2018-12-11 ENCOUNTER — Other Ambulatory Visit: Payer: Self-pay

## 2018-12-11 ENCOUNTER — Emergency Department (HOSPITAL_BASED_OUTPATIENT_CLINIC_OR_DEPARTMENT_OTHER)
Admission: EM | Admit: 2018-12-11 | Discharge: 2018-12-12 | Disposition: A | Discharge status: Home / Self Care | Payer: BC Managed Care – PPO | Attending: Emergency Medicine | Admitting: Emergency Medicine

## 2018-12-11 DIAGNOSIS — U071 COVID-19: Secondary | ICD-10-CM | POA: Insufficient documentation

## 2018-12-11 DIAGNOSIS — Z6841 Body Mass Index (BMI) 40.0 and over, adult: Secondary | ICD-10-CM

## 2018-12-11 DIAGNOSIS — R0602 Shortness of breath: Secondary | ICD-10-CM

## 2018-12-11 NOTE — ED Triage Notes (Signed)
Pt c/o SOB and is +covid-requesting repeat CXR-NAD-steady gait

## 2018-12-11 NOTE — Progress Notes (Signed)
  I connected by phone with [facility] on 12/11/2018 at 10:29 AM to discuss the potential use of an new treatment for mild to moderate COVID-19 viral infection in non-hospitalized patients.  This patient is a 43 y.o. male that meets the FDA criteria for Emergency Use Authorization of bamlanivimab:  Has a (+) direct SARS-CoV-2 viral test result  Has mild or moderate COVID-19   Is ? 43 years of age and weighs ? 40 kg  Is NOT hospitalized due to COVID-19  Is NOT requiring oxygen therapy or requiring an increase in baseline oxygen flow rate due to COVID-19  Is within 10 days of symptom onset  Has at least one of the high risk factor(s) for progression to severe COVID-19 and/or hospitalization as defined in EUA.  Specific high risk criteria : BMI >/= 35  Patient Active Problem List   Diagnosis Date Noted  . Sore throat 08/02/2018  . Urethritis 03/06/2018  . 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 Other tear of medial meniscus, current injury, left knee, subsequent encounter 05/04/2017  . Pes planus 02/07/2017  . Laryngopharyngeal reflux (LPR) 07/24/2015  . Post corneal transplant 07/25/2014  . 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 Benign paroxysmal positional vertigo 08/30/2013  . Adrenal mass, right ([place]) 05/22/2013  . Sleep apnea 07/31/2009  . OBESITY, NOS 03/16/2006    I have spoken and communicated the following to the patient or parent/caregiver:  1. FDA has authorized the emergency use of bamlanivimab for the treatment of mild to moderate COVID-19 in adults and pediatric patients with positive results of direct SARS-CoV-2 viral testing who are 14 years of age and older weighing at least 40 kg, and who are at high risk for progressing to severe COVID-19 and/or hospitalization.  2. The significant known and potential risks and benefits of bamlanivimab, and the extent to which such potential risks and benefits are  unknown.  3. Information on available alternative treatments and the risks and benefits of those alternatives, including clinical trials.  4. Patients treated with bamlanivimab should continue to self-isolate and use infection control measures (e.g., wear mask, isolate, social distance, avoid sharing personal items, clean and disinfect "high touch" surfaces, and frequent handwashing) according to CDC guidelines.   5. The patient or parent/caregiver has the option to accept or refuse bamlanivimab.  After reviewing this information with the patient, The patient agreed to proceed with receiving the infusion of bamlanivimab and will be provided a copy of the Fact sheet prior to receiving the infusion.  Asencion Noble 12/11/2018 10:29 AM

## 2018-12-12 ENCOUNTER — Emergency Department (HOSPITAL_BASED_OUTPATIENT_CLINIC_OR_DEPARTMENT_OTHER): Payer: BC Managed Care – PPO

## 2018-12-12 ENCOUNTER — Ambulatory Visit (HOSPITAL_COMMUNITY): Payer: BC Managed Care – PPO

## 2018-12-12 ENCOUNTER — Telehealth (HOSPITAL_COMMUNITY): Payer: Self-pay

## 2018-12-12 NOTE — ED Notes (Signed)
ED Provider at bedside. 

## 2018-12-12 NOTE — Discharge Instructions (Signed)
You were evaluated in the Emergency Department and after careful evaluation, we did not find any emergent condition requiring admission or further testing in the hospital.  Your exam/testing today is overall reassuring.  X-ray did not show any worsening of your pneumonia.  Please return to the Emergency Department if you experience any worsening of your condition.  We encourage you to follow up with a primary care provider.  Thank you for allowing [place] to be a part of your care.

## 2018-12-12 NOTE — ED Provider Notes (Signed)
[facility] Emergency Department Provider Note MRN:  [id number]  Arrival date & time: 12/12/18     Chief Complaint   Shortness of Breath   History of Present Illness   [facility] is a 43 y.o. year-old male with a history of obesity presenting to the ED with chief complaint of shortness of breath.  Patient has been having cough for 9 days, tested positive for COVID-19 a few days ago.  Explains that his shortness of breath is slightly worsened today.  Also having continued sharp chest pains, which she thinks might be due to anxiety.  He is here for a repeat x-ray to make sure that his pneumonia is not worsening.  He denies fever, no headache, no vomiting, no diarrhea, no abdominal pain.  Symptoms are constant, no exacerbating relieving factors.  Review of Systems  A complete 10 system review of systems was obtained and all systems are negative except as noted in the HPI and PMH.   Patient's Health History    Past Medical History:  Diagnosis Date  . Medial meniscus tear 01/2012   right knee  . Obesity   . Pneumonia   . Renal disorder   . Vertigo, benign paroxysmal 2015    Past Surgical History:  Procedure Laterality Date  . CORNEAL TRANSPLANT  12/2009  . KNEE ARTHROSCOPY  02/20/2012   Procedure: ARTHROSCOPY KNEE;  Surgeon: Alta Corning, MD;  Location: [place];  Service: Orthopedics;  Laterality: Right;  Chondroplasia patella femoral joint, medial meniscal debriedment    Family History  Problem Relation Age of Onset  . Asthma Mother   . Diabetes Father   . Cancer Father        prostate  . Osteoarthritis Sister   . Diabetes Sister     Social History   Socioeconomic History  . Marital status: Legally Separated    Spouse name: Not on file  . Number of children: Not on file  . Years of education: Not on file  . Highest education level: Not on file  Occupational History  . Not on file  Social Needs  . Financial  resource strain: Not on file  . Food insecurity    Worry: Not on file    Inability: Not on file  . Transportation needs    Medical: Not on file    Non-medical: Not on file  Tobacco Use  . Smoking status: Never Smoker  . Smokeless tobacco: Never Used  Substance and Sexual Activity  . Alcohol use: No  . Drug use: No  . Sexual activity: Not on file  Lifestyle  . Physical activity    Days per week: Not on file    Minutes per session: Not on file  . Stress: Not on file  Relationships  . Social Herbalist on phone: Not on file    Gets together: Not on file    Attends religious service: Not on file    Active member of club or organization: Not on file    Attends meetings of clubs or organizations: Not on file    Relationship status: Not on file  . Intimate partner violence    Fear of current or ex partner: Not on file    Emotionally abused: Not on file    Physically abused: Not on file    Forced sexual activity: Not on file  Other Topics Concern  . Not on file  Social History Narrative  .  Not on file     Physical Exam  Vital Signs and Nursing Notes reviewed Vitals:   12/11/18 2220 12/12/18 0022  BP: 140/80 126/64  Pulse: 95 93  Resp: 18 (!) 22  Temp: 98.8 F (37.1 C) 99.5 F (37.5 C)  SpO2: 99% 97%    CONSTITUTIONAL: Well-appearing, NAD NEURO:  Alert and oriented x 3, no focal deficits EYES:  eyes equal and reactive ENT/NECK:  no LAD, no JVD CARDIO: Regular rate, well-perfused, normal S1 and S2 PULM:  CTAB no wheezing or rhonchi, no increased work of breathing, no tachypnea GI/GU:  normal bowel sounds, non-distended, non-tender MSK/SPINE:  No gross deformities, no edema SKIN:  no rash, atraumatic PSYCH:  Appropriate speech and behavior  Diagnostic and Interventional Summary    EKG Interpretation  Date/Time:  Wednesday December 12 2018 00:21:13 EST Ventricular Rate:  87 PR Interval:    QRS Duration: 94 QT Interval:  338 QTC Calculation: 407 R  Axis:   -49 Text Interpretation: Sinus rhythm Left anterior fascicular block Abnormal R-wave progression, late transition Borderline T abnormalities, inferior leads Borderline ST elevation, lateral leads No significant change was found Confirmed by Gerlene Fee [phone]) on 12/12/2018 12:59:58 AM      Labs Reviewed - No data to display  XR Chest Single View  Final Result      Medications - No data to display   Procedures  /  Critical Care Procedures  ED Course and Medical Decision Making  I have reviewed the triage vital signs and the nursing notes.  Pertinent labs & imaging results that were available during my care of the patient were reviewed by me and considered in my medical decision making (see below for details).     COVID-19 with subjective worsening shortness of breath, continued atypical chest pain.  Suspect large anxiety component, no increased work of breathing, reassuring vital signs, no hypoxia, lungs largely clear.  Discussed how repeat x-ray will likely not change any management.  Patient still insistent upon repeat x-ray.  Will screen with EKG as well.  Nothing on exam to suggest ACS or PE.  EKG unchanged, x-ray without evidence of consolidation, patient continues to have no hypoxia and is appropriate for discharge.  Barth Kirks. Sedonia Small, MD [place] [email or web address]  Final Clinical Impressions(s) / ED Diagnoses     ICD-10-CM   1. COVID-19  U07.1   2. SOB (shortness of breath)  R06.02 XR Chest Single View    XR Chest Single View    ED Discharge Orders    None       Discharge Instructions Discussed with and Provided to Patient:     Discharge Instructions     You were evaluated in the Emergency Department and after careful evaluation, we did not find any emergent condition requiring admission or further testing in the hospital.  Your exam/testing today is overall reassuring.  X-ray did not show any  worsening of your pneumonia.  Please return to the Emergency Department if you experience any worsening of your condition.  We encourage you to follow up with a primary care provider.  Thank you for allowing [place] to be a part of your care.       Maudie Flakes, MD 12/12/18 [phone]

## 2018-12-12 NOTE — Telephone Encounter (Signed)
Spoke with patient regarding Bamlanivimab infusion originally scheduled for today.  He stated that he would not be able to make it today so he will need to keep the rescheduled appointment for Friday at [place].

## 2018-12-14 ENCOUNTER — Ambulatory Visit (HOSPITAL_COMMUNITY): Payer: BC Managed Care – PPO

## 2018-12-25 ENCOUNTER — Other Ambulatory Visit: Payer: Self-pay

## 2018-12-25 DIAGNOSIS — Z20822 Contact with and (suspected) exposure to covid-19: Secondary | ICD-10-CM

## 2018-12-27 LAB — NOVEL CORONAVIRUS, NAA: SARS-CoV-2, NAA: NOT DETECTED

## 2019-02-12 ENCOUNTER — Other Ambulatory Visit: Payer: Self-pay

## 2019-02-12 ENCOUNTER — Ambulatory Visit (INDEPENDENT_AMBULATORY_CARE_PROVIDER_SITE_OTHER): Payer: Self-pay | Admitting: Student in an Organized Health Care Education/Training Program

## 2019-02-12 DIAGNOSIS — K648 Other hemorrhoids: Secondary | ICD-10-CM

## 2019-02-12 MED ORDER — METAMUCIL SMOOTH TEXTURE 58.6 % PO POWD: 1.0000 | Freq: Three times a day (TID) | ORAL | 1 refills | Status: DC

## 2019-02-12 NOTE — Assessment & Plan Note (Signed)
History and exam congruent with internal hemorrhoids.  Provided patient with education on preventative methods and also prescribed fiber supplement. Discussed surgical options if conservative management fails. Patient is anxious about ulterior causes of rectal bleeding and is amenable to colorectal screening. Given patient is African-American male and almost 69, it would not be unreasonable for him to begin colorectal cancer screening later this year which patient is highly agreeable with although we discussed this is an unlikely cause of his current symptoms.

## 2019-02-12 NOTE — Patient Instructions (Signed)
It was a pleasure to see you today!  To summarize our discussion for this visit:  You have internal hemorrhoids which are causing the bleeding when wiping.   The first thing to try to treat these is to increase the fiber in your diet to prevent straining when you have a bowel movement.  If they continue to cause you problems it is possible to have them surgically removed but most of the time they will get better with conservative treatment.  Another thing you can consider is obtaining a colonoscopy later this year as some guidelines recommend that African-American males begin colon cancer screening at the age of 45.  Otherwise, recommendation is to begin screening at age 45.  Call the clinic at [phone] if your symptoms worsen or you have any concerns.   Thank you for allowing me to take part in your care,  Dr. Doristine Mango   Hemorrhoids Hemorrhoids are swollen veins that may develop:  In the butt (rectum). These are called internal hemorrhoids.  Around the opening of the butt (anus). These are called external hemorrhoids. Hemorrhoids can cause pain, itching, or bleeding. Most of the time, they do not cause serious problems. They usually get better with diet changes, lifestyle changes, and other home treatments. What are the causes? This condition may be caused by:  Having trouble pooping (constipation).  Pushing hard (straining) to poop.  Watery poop (diarrhea).  Pregnancy.  Being very overweight (obese).  Sitting for long periods of time.  Heavy lifting or other activity that causes you to strain.  Anal sex.  Riding a bike for a long period of time. What are the signs or symptoms? Symptoms of this condition include:  Pain.  Itching or soreness in the butt.  Bleeding from the butt.  Leaking poop.  Swelling in the area.  One or more lumps around the opening of your butt. How is this diagnosed? A doctor can often diagnose this condition by looking  at the affected area. The doctor may also:  Do an exam that involves feeling the area with a gloved hand (digital rectal exam).  Examine the area inside your butt using a small tube (anoscope).  Order blood tests. This may be done if you have lost a lot of blood.  Have you get a test that involves looking inside the colon using a flexible tube with a camera on the end (sigmoidoscopy or colonoscopy). How is this treated? This condition can usually be treated at home. Your doctor may tell you to change what you eat, make lifestyle changes, or try home treatments. If these do not help, procedures can be done to remove the hemorrhoids or make them smaller. These may involve:  Placing rubber bands at the base of the hemorrhoids to cut off their blood supply.  Injecting medicine into the hemorrhoids to shrink them.  Shining a type of light energy onto the hemorrhoids to cause them to fall off.  Doing surgery to remove the hemorrhoids or cut off their blood supply. Follow these instructions at home: Eating and drinking   Eat foods that have a lot of fiber in them. These include whole grains, beans, nuts, fruits, and vegetables.  Ask your doctor about taking products that have added fiber (fibersupplements).  Reduce the amount of fat in your diet. You can do this by: ? Eating low-fat dairy products. ? Eating less red meat. ? Avoiding processed foods.  Drink enough fluid to keep your pee (urine) pale yellow. Managing pain  and swelling   Take a warm-water bath (sitz bath) for 20 minutes to ease pain. Do this 3-4 times a day. You may do this in a bathtub or using a portable sitz bath that fits over the toilet.  If told, put ice on the painful area. It may be helpful to use ice between your warm baths. ? Put ice in a plastic bag. ? Place a towel between your skin and the bag. ? Leave the ice on for 20 minutes, 2-3 times a day. General instructions  Take over-the-counter and  prescription medicines only as told by your doctor. ? Medicated creams and medicines may be used as told.  Exercise often. Ask your doctor how much and what kind of exercise is best for you.  Go to the bathroom when you have the urge to poop. Do not wait.  Avoid pushing too hard when you poop.  Keep your butt dry and clean. Use wet toilet paper or moist towelettes after pooping.  Do not sit on the toilet for a long time.  Keep all follow-up visits as told by your doctor. This is important. Contact a doctor if you:  Have pain and swelling that do not get better with treatment or medicine.  Have trouble pooping.  Cannot poop.  Have pain or swelling outside the area of the hemorrhoids. Get help right away if you have:  Bleeding that will not stop. Summary  Hemorrhoids are swollen veins in the butt or around the opening of the butt.  They can cause pain, itching, or bleeding.  Eat foods that have a lot of fiber in them. These include whole grains, beans, nuts, fruits, and vegetables.  Take a warm-water bath (sitz bath) for 20 minutes to ease pain. Do this 3-4 times a day. This information is not intended to replace advice given to you by your health care provider. Make sure you discuss any questions you have with your health care provider. Document Revised: 01/11/2018 Document Reviewed: 05/25/2017 Elsevier Patient Education  [place].

## 2019-02-12 NOTE — Progress Notes (Signed)
   Subjective:    Patient ID: Derrick Scott, male    DOB: [birth year], 44 y.o.   MRN: [id number]  CC: blood in stool  HPI:  Patient had 2 episodes of blood on toilet paper after bowel movements last week and has not had recurrence since then.  Patient showed image on phone of bright red scant amounts of blood on toilet paper.  Denies painful bowel movements but does have mild pain with wiping.  Denies any pruritus around anal area.  Denies constipation or diarrhea. Patient has bowel movement 3-4 times per day denies having any type of blood streak on the stool or bloody color to the stool.  Has had no recent changes in eating habits or bowel movement habits and has no urinary symptoms.  He states that his diet is very heavy in protein.  Denies abdominal pain, weight loss, night sweats, fever.  Smoking status reviewed   ROS: pertinent noted in the HPI   I have personally reviewed pertinent past medical history, surgical, family, and social history as appropriate. Objective:  BP 125/80   Pulse 85   Wt 283 lb 9.6 oz (128.6 kg)   SpO2 98%   BMI 41.88 kg/m   Vitals and nursing note reviewed  General: NAD, pleasant, able to participate in exam DRE: Anus negative for external hemorrhoids or fissures.  No signs of excoriations, erythema, drainage, masses.  Sphincter tone strong. Positive for boggy gross internal to anal sphincter to palpation.  No blood or stool on finger when withdrawn. Extremities: no edema or cyanosis. Skin: warm and dry, no rashes noted.  Multiple nevi on thigh and buttocks. Neuro: alert, no obvious focal deficits Psych: Normal affect and mood  Assessment & Plan:   Internal hemorrhoids without complication History and exam congruent with internal hemorrhoids.  Provided patient with education on preventative methods and also prescribed fiber supplement. Discussed surgical options if conservative management fails. Patient is anxious about ulterior causes of  rectal bleeding and is amenable to colorectal screening. Given patient is African-American male and almost 62, it would not be unreasonable for him to begin colorectal cancer screening later this year which patient is highly agreeable with although we discussed this is an unlikely cause of his current symptoms.  Orders Placed This Encounter  Procedures  . Ambulatory referral to Gastroenterology    Referral Priority:   Routine    Referral Type:   Consultation    Referral Reason:   Specialty Services Required    Number of Visits Requested:   1    Meds ordered this encounter  Medications  . psyllium (METAMUCIL SMOOTH TEXTURE) 58.6 % powder    Sig: Take 1 packet by mouth 3 (three) times daily.    Dispense:  283 g    Refill:  [place] PGY-2

## 2019-03-15 ENCOUNTER — Encounter: Payer: Self-pay | Admitting: Family Medicine

## 2019-03-15 ENCOUNTER — Ambulatory Visit (INDEPENDENT_AMBULATORY_CARE_PROVIDER_SITE_OTHER): Payer: Self-pay | Admitting: Family Medicine

## 2019-03-15 ENCOUNTER — Other Ambulatory Visit: Payer: Self-pay

## 2019-03-15 VITALS — BP 134/81 | Ht 68.0 in | Wt 275.0 lb

## 2019-03-15 DIAGNOSIS — M25561 Pain in right knee: Secondary | ICD-10-CM

## 2019-03-15 MED ORDER — MELOXICAM 15 MG PO TABS: 15.0000 mg | ORAL_TABLET | Freq: Every day | ORAL | 2 refills | Status: DC | PRN

## 2019-03-15 NOTE — Progress Notes (Signed)
PCP: Dickie La, MD  Subjective:   HPI: Patient is a 44 y.o. male here for evaluation of right knee pain.  Patient notes pain is been present for the last several days.  He denies any specific injury or trauma.  He notes pain is located along medial joint line does not radiate.  He did get some swelling the last few days which is started to improve.  He will occasionally get some locking and catching of the knee and feels like the knee is unstable and will give out.  Patient notes he is started become more active over the last several months and started running 3 miles several days per week.  Patient denies any numbness or tingling of the knee.  He denies any previous knee injuries.  Over the last several days his knee pain has improved slightly.   Review of Systems: See HPI above.  Past Medical History:  Diagnosis Date  . Medial meniscus tear 01/2012   right knee  . Obesity   . Pneumonia   . Renal disorder   . Vertigo, benign paroxysmal 2015    Current Outpatient Medications on File Prior to Visit  Medication Sig Dispense Refill  . albuterol (PROVENTIL HFA;VENTOLIN HFA) 108 (90 Base) MCG/ACT inhaler Inhale 1-2 puffs into the lungs every 6 (six) hours as needed for wheezing or shortness of breath. 1 Inhaler 0  . naproxen (NAPROSYN) 500 MG tablet Take 1 tablet (500 mg total) by mouth 2 (two) times daily as needed for moderate pain (Or fevers / body aches). (Patient not taking: Reported on 03/15/2019) 30 tablet 0  . predniSONE (STERAPRED UNI-PAK 21 TAB) 10 MG (21) TBPK tablet Take 6 tabs for 2 days, then 5 for 2 days, then 4 for 2 days, then 3 for 2 days, 2 for 2 days, then 1 for 2 days (Patient not taking: Reported on 03/15/2019) 42 tablet 0  . psyllium (METAMUCIL SMOOTH TEXTURE) 58.6 % powder Take 1 packet by mouth 3 (three) times daily. 283 g 1   No current facility-administered medications on file prior to visit.    Past Surgical History:  Procedure Laterality Date  . CORNEAL  TRANSPLANT  12/2009  . KNEE ARTHROSCOPY  02/20/2012   Procedure: ARTHROSCOPY KNEE;  Surgeon: Alta Corning, MD;  Location: [facility];  Service: Orthopedics;  Laterality: Right;  Chondroplasia patella femoral joint, medial meniscal debriedment    No Known Allergies  Social History   Socioeconomic History  . Marital status: Legally Separated    Spouse name: Not on file  . Number of children: Not on file  . Years of education: Not on file  . Highest education level: Not on file  Occupational History  . Not on file  Tobacco Use  . Smoking status: Never Smoker  . Smokeless tobacco: Never Used  Substance and Sexual Activity  . Alcohol use: No  . Drug use: No  . Sexual activity: Not on file  Other Topics Concern  . Not on file  Social History Narrative  . Not on file   Social Determinants of Health   Financial Resource Strain:   . Difficulty of Paying Living Expenses: Not on file  Food Insecurity:   . Worried About Charity fundraiser in the Last Year: Not on file  . Ran Out of Food in the Last Year: Not on file  Transportation Needs:   . Lack of Transportation (Medical): Not on file  . Lack of Transportation (Non-Medical):  Not on file  Physical Activity:   . Days of Exercise per Week: Not on file  . Minutes of Exercise per Session: Not on file  Stress:   . Feeling of Stress : Not on file  Social Connections:   . Frequency of Communication with Friends and Family: Not on file  . Frequency of Social Gatherings with Friends and Family: Not on file  . Attends Religious Services: Not on file  . Active Member of Clubs or Organizations: Not on file  . Attends Archivist Meetings: Not on file  . Marital Status: Not on file  Intimate Partner Violence:   . Fear of Current or Ex-Partner: Not on file  . Emotionally Abused: Not on file  . Physically Abused: Not on file  . Sexually Abused: Not on file    Family History  Problem Relation Age of Onset  .  Asthma Mother   . Diabetes Father   . Cancer Father        prostate  . Osteoarthritis Sister   . Diabetes Sister         Objective:  Physical Exam: BP 134/81   Ht 5\' 8"  (1.727 m)   Wt 275 lb (124.7 kg)   BMI 41.81 kg/m  Gen: NAD, comfortable in exam room Lungs: Breathing comfortably on room air Knee Exam Right -Inspection: no deformity, no discoloration -Palpation: Tenderness palpation along the medial joint line -ROM: Extension: 0 degrees; Flexion: 130 degrees -Strength: Extension: 5/5; Flexion: 5/5 -Special Tests: Varus Stress: Negative; Valgus Stress: Negative; Lachman: Negative; Posterior drawer: Negative; McMurray: Negative; Thessaly: Positive -Limb neurovascularly intact, no instability noted  Gait: Antalgic gait with both feet and mild pronation with walking  Examination of bilateral feet reveals that he has flattening of the arches bilaterally and pronates while standing however the pronation is worse in the left foot than the right.  Limited diagnostic ultrasound of the right knee Findings: -Spurring calcifications noted within the distal quad tendon at the attachment of the patella -Calcifications noted within the proximal patellar tendon -Normal appearance of the suprapatellar pouch with physiologic amount of fluid -Edema noted within both the medial lateral joint lines -Medial lateral meniscus were incompletely visualized with some evidence of fraying Impression: -Ultrasound findings consistent with possible meniscus tear as well as chronic changes of the quad and patellar tendons    Assessment & Plan:  Patient is a 44 y.o. male here for evaluation of right knee pain  1.  Right knee pain -Also findings showing evidence of possible meniscus tear as well as chronic overuse injuries of the quad and patellar tendons -As patient symptoms have only been present for the last several days he is elected to treated conservatively for now -Patient will take meloxicam  as needed for pain.  He will avoid other NSAIDs such as ibuprofen or Aleve -Patient will reduce the volume of running for the next several days -Patient was offered a corticosteroid injection today however he declined this.  He may consider this at a future visit -Patient was given a hinged knee brace for added stability during activity -Patient was given a note for work excusing him yesterday and today  Patient will follow up in 2 weeks as needed

## 2019-03-15 NOTE — Patient Instructions (Addendum)
The ultrasound of your knee today showed possible injury to the meniscus as well as old injuries to the quadriceps and patellar tendons of the knee -I have sent in a prescription for meloxicam.  He may take this once a day as needed for pain.  Do not take it with other anti-inflammatories such as ibuprofen or Aleve -You may ice the knee as needed for pain -You may wear the knee brace as needed for stability with activities -We can consider corticosteroid injection of the knee in the future if your pain does not resolve  We will see back in 2 weeks if your knee continues to have pain

## 2019-03-16 NOTE — Progress Notes (Signed)
[facility]: Attending Note: I have reviewed the chart, discussed wit the Sports Medicine Fellow. I agree with assessment and treatment plan as detailed in the [facility] note. Knee pain consistent with his recent return to running.  He is congratulated on his 25 pound weight loss.  He decided not to pursue corticosteroid injection today.  He will follow-up as needed.

## 2019-04-05 ENCOUNTER — Emergency Department (HOSPITAL_BASED_OUTPATIENT_CLINIC_OR_DEPARTMENT_OTHER)
Admission: EM | Admit: 2019-04-05 | Discharge: 2019-04-05 | Disposition: A | Discharge status: Home / Self Care | Payer: Self-pay | Attending: Emergency Medicine | Admitting: Emergency Medicine

## 2019-04-05 ENCOUNTER — Other Ambulatory Visit: Payer: Self-pay

## 2019-04-05 ENCOUNTER — Encounter (HOSPITAL_BASED_OUTPATIENT_CLINIC_OR_DEPARTMENT_OTHER): Payer: Self-pay | Admitting: Emergency Medicine

## 2019-04-05 DIAGNOSIS — Z20822 Contact with and (suspected) exposure to covid-19: Secondary | ICD-10-CM | POA: Insufficient documentation

## 2019-04-05 DIAGNOSIS — Z79899 Other long term (current) drug therapy: Secondary | ICD-10-CM | POA: Insufficient documentation

## 2019-04-05 DIAGNOSIS — B349 Viral infection, unspecified: Secondary | ICD-10-CM

## 2019-04-05 LAB — SARS CORONAVIRUS 2 AG (30 MIN TAT): SARS Coronavirus 2 Ag: NEGATIVE

## 2019-04-05 MED ORDER — SODIUM CHLORIDE 0.9 % IV BOLUS
1000.0000 mL | Freq: Once | INTRAVENOUS | Status: AC
  Administered 2019-04-05: 1000 mL via INTRAVENOUS

## 2019-04-05 MED ORDER — KETOROLAC TROMETHAMINE 15 MG/ML IJ SOLN
15.0000 mg | Freq: Once | INTRAMUSCULAR | Status: AC
  Administered 2019-04-05: 15 mg via INTRAVENOUS
  Filled 2019-04-05: qty 1

## 2019-04-05 MED ORDER — ONDANSETRON HCL 4 MG/2ML IJ SOLN
4.0000 mg | Freq: Once | INTRAMUSCULAR | Status: AC
  Administered 2019-04-05: 4 mg via INTRAVENOUS
  Filled 2019-04-05: qty 2

## 2019-04-05 MED ORDER — ONDANSETRON 8 MG PO TBDP: 8.0000 mg | ORAL_TABLET | Freq: Three times a day (TID) | ORAL | 0 refills | Status: DC | PRN

## 2019-04-05 NOTE — ED Provider Notes (Signed)
[facility] Provider Note: Georgena Spurling, MD, FACEP  CSN: [id number] MRN: [id number] ARRIVAL: 04/05/19 at [place]: [place]  Fever   HISTORY OF PRESENT ILLNESS  04/05/19 12:48 AM Derrick Scott is a 44 y.o. male with a 1 day history of nasal congestion, rhinorrhea, body aches, headache, abdominal cramping, nausea and diarrhea.  His fever has been as high as 100.2.  He rates his body aches as a 6 out of 10, generalized in nature.  He had some relief taking Doan's pills earlier.  He denies loss of taste or smell, shortness of breath or cough.   Past Medical History:  Diagnosis Date  . Medial meniscus tear 01/2012   right knee  . Obesity   . Pneumonia   . Renal disorder   . Vertigo, benign paroxysmal 2015    Past Surgical History:  Procedure Laterality Date  . CORNEAL TRANSPLANT  12/2009  . KNEE ARTHROSCOPY  02/20/2012   Procedure: ARTHROSCOPY KNEE;  Surgeon: Alta Corning, MD;  Location: [facility];  Service: Orthopedics;  Laterality: Right;  Chondroplasia patella femoral joint, medial meniscal debriedment    Family History  Problem Relation Age of Onset  . Asthma Mother   . Diabetes Father   . Cancer Father        prostate  . Osteoarthritis Sister   . Diabetes Sister     Social History   Tobacco Use  . Smoking status: Never Smoker  . Smokeless tobacco: Never Used  Substance Use Topics  . Alcohol use: No  . Drug use: No    Prior to Admission medications   Medication Sig Start Date End Date Taking? Authorizing Provider  albuterol (PROVENTIL HFA;VENTOLIN HFA) 108 (90 Base) MCG/ACT inhaler Inhale 1-2 puffs into the lungs every 6 (six) hours as needed for wheezing or shortness of breath. 04/09/18   Volanda Napoleon, PA-C  meloxicam (MOBIC) 15 MG tablet Take 1 tablet (15 mg total) by mouth daily as needed for pain. 03/15/19   Inez Catalina, MD  naproxen (NAPROSYN) 500 MG tablet Take 1 tablet (500 mg total) by  mouth 2 (two) times daily as needed for moderate pain (Or fevers / body aches). Patient not taking: Reported on 03/15/2019 12/05/18   Corena Herter, PA-C  predniSONE (STERAPRED UNI-PAK 21 TAB) 10 MG (21) TBPK tablet Take 6 tabs for 2 days, then 5 for 2 days, then 4 for 2 days, then 3 for 2 days, 2 for 2 days, then 1 for 2 days Patient not taking: Reported on 03/15/2019 12/10/18   Isla Pence, MD  psyllium (METAMUCIL SMOOTH TEXTURE) 58.6 % powder Take 1 packet by mouth 3 (three) times daily. 02/12/19   Doristine Mango L, DO    Allergies Patient has no known allergies.   REVIEW OF SYSTEMS  Negative except as noted here or in the History of Present Illness.   PHYSICAL EXAMINATION  Initial Vital Signs Blood pressure 137/83, pulse 98, temperature 98.4 F (36.9 C), resp. rate 18, height '5\' 9"'  (1.753 m), weight 125.3 kg, SpO2 98 %.  Examination General: Well-developed, well-nourished male in no acute distress; appearance consistent with age of record HENT: normocephalic; atraumatic Eyes: pupils equal, round and reactive to light; extraocular muscles intact Neck: supple Heart: regular rate and rhythm Lungs: clear to auscultation bilaterally Abdomen: soft; nondistended; nontender; bowel sounds present Extremities: No deformity; full range of motion; pulses normal Neurologic: Awake, alert and oriented; motor function intact in  all extremities and symmetric; no facial droop Skin: Warm and dry Psychiatric: Normal mood and affect   RESULTS  Summary of this visit's results, reviewed and interpreted by myself:   EKG Interpretation  Date/Time:    Ventricular Rate:    PR Interval:    QRS Duration:   QT Interval:    QTC Calculation:   R Axis:     Text Interpretation:        Laboratory Studies: Results for orders placed or performed during the hospital encounter of 04/05/19 (from the past 24 hour(s))  SARS Coronavirus 2 Ag (30 min TAT) - Nasal Swab (BD Veritor Kit)     Status:  None   Collection Time: 04/05/19  1:14 AM   Specimen: Nasal Swab (BD Veritor Kit)  Result Value Ref Range   SARS Coronavirus 2 Ag NEGATIVE NEGATIVE   Imaging Studies: No results found.  ED COURSE and MDM  Nursing notes, initial and subsequent vitals signs, including pulse oximetry, reviewed and interpreted by myself.  Vitals:   04/05/19 0030 04/05/19 0033  BP:  137/83  Pulse:  98  Resp:  18  Temp:  98.4 F (36.9 C)  SpO2:  98%  Weight: 125.3 kg   Height:  '5\' 9"'  (1.753 m)   Medications  sodium chloride 0.9 % bolus 1,000 mL (1,000 mLs Intravenous New Bag/Given 04/05/19 0113)  ondansetron (ZOFRAN) injection 4 mg (4 mg Intravenous Given 04/05/19 0113)  ketorolac (TORADOL) 15 MG/ML injection 15 mg (15 mg Intravenous Given 04/05/19 0115)   1:49 AM Patient feeling better after IV fluids and medications.  Covid antigen test is negative.  Because patient had Covid in November of last year so it is unlikely he has it a second time.   PROCEDURES  Procedures   ED DIAGNOSES     ICD-10-CM   1. Viral illness  B34.9        Taelyr Jantz, MD 04/05/19 [id number]

## 2019-04-05 NOTE — ED Triage Notes (Signed)
Pt arrives with runny nose, diarrhea, muscle aches/pain, fever TMAX 100.2 onset today.

## 2019-04-15 ENCOUNTER — Encounter: Payer: Self-pay | Admitting: Gastroenterology

## 2019-04-29 ENCOUNTER — Ambulatory Visit: Payer: Self-pay | Admitting: Gastroenterology

## 2019-05-25 ENCOUNTER — Encounter (HOSPITAL_BASED_OUTPATIENT_CLINIC_OR_DEPARTMENT_OTHER): Payer: Self-pay | Admitting: Emergency Medicine

## 2019-05-25 ENCOUNTER — Emergency Department (HOSPITAL_BASED_OUTPATIENT_CLINIC_OR_DEPARTMENT_OTHER): Payer: Self-pay

## 2019-05-25 ENCOUNTER — Emergency Department (HOSPITAL_BASED_OUTPATIENT_CLINIC_OR_DEPARTMENT_OTHER)
Admission: EM | Admit: 2019-05-25 | Discharge: 2019-05-25 | Disposition: A | Discharge status: Home / Self Care | Payer: Self-pay | Attending: Emergency Medicine | Admitting: Emergency Medicine

## 2019-05-25 ENCOUNTER — Other Ambulatory Visit: Payer: Self-pay

## 2019-05-25 DIAGNOSIS — Z79899 Other long term (current) drug therapy: Secondary | ICD-10-CM | POA: Insufficient documentation

## 2019-05-25 DIAGNOSIS — R0789 Other chest pain: Secondary | ICD-10-CM | POA: Insufficient documentation

## 2019-05-25 DIAGNOSIS — Z8616 Personal history of COVID-19: Secondary | ICD-10-CM | POA: Insufficient documentation

## 2019-05-25 DIAGNOSIS — M546 Pain in thoracic spine: Secondary | ICD-10-CM | POA: Insufficient documentation

## 2019-05-25 MED ORDER — METHOCARBAMOL 500 MG PO TABS: 500.0000 mg | ORAL_TABLET | Freq: Two times a day (BID) | ORAL | 0 refills | Status: DC

## 2019-05-25 MED ORDER — IBUPROFEN 800 MG PO TABS
800.0000 mg | ORAL_TABLET | Freq: Once | ORAL | Status: AC
  Administered 2019-05-25: 800 mg via ORAL
  Filled 2019-05-25: qty 1

## 2019-05-25 NOTE — ED Triage Notes (Signed)
C/o thoracic back pain x 2 days after moving some heavy items at work. Additionally, he adds he has been experiencing L side chest pain intermittently for several weeks.

## 2019-05-25 NOTE — Discharge Instructions (Addendum)
Your x-ray had no acute abnormalities today.  As we discussed the x-ray is primarily to rule out any fracture or significant dislocation of discs but does not directly visualize the discs themselves.  I suspect that you have a slipped/protruding disc.  I will go ahead and prescribe you conservative therapy at this time and recommend following up with your primary care doctor who may consider additional management/imaging.  If you have any numbness between your legs, spike a significant fever or have any other new or concerning symptoms please return to emergency department for reevaluation.

## 2019-05-25 NOTE — ED Provider Notes (Signed)
[facility] EMERGENCY DEPARTMENT Provider Note   CSN: [id number] Arrival date & time: 05/25/19  [id number]     History Chief Complaint  Patient presents with  . Back Pain  . Chest Pain    Derrick Scott is a 44 y.o. male.  HPI Patient is 44 year old male with a history of obesity, BPPV and corneal transplant presented today for thoracic back pain that began 2 days ago when he was at work doing some heavy lifting moving boxes from side to side that required twisting and lifting motion. He states the pain was achy, 7/10, acute onset states that it feels better when he is not moving but states that when he worsens the pain with movement and lifting he notices that the pain seems to radiate out from the middle of the spine.     Past Medical History:  Diagnosis Date  . Medial meniscus tear 01/2012   right knee  . Obesity   . Pneumonia   . Renal disorder   . Vertigo, benign paroxysmal 2015    Patient Active Problem List   Diagnosis Date Noted  . Internal hemorrhoids without complication [id number]  . COVID-19 virus infection 12/11/2018  . Urethritis 03/06/2018  . 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 Other tear of medial meniscus, current injury, left knee, subsequent encounter 05/04/2017  . Pes planus 02/07/2017  . Laryngopharyngeal reflux (LPR) 07/24/2015  . Post corneal transplant 07/25/2014  . 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 Benign paroxysmal positional vertigo 08/30/2013  . Adrenal mass, right ([place]) 05/22/2013  . Sleep apnea 07/31/2009  . OBESITY, NOS 03/16/2006    Past Surgical History:  Procedure Laterality Date  . CORNEAL TRANSPLANT  12/2009  . KNEE ARTHROSCOPY  02/20/2012   Procedure: ARTHROSCOPY KNEE;  Surgeon: Alta Corning, MD;  Location: [facility];  Service: Orthopedics;  Laterality: Right;  Chondroplasia patella femoral joint, medial meniscal debriedment       Family  History  Problem Relation Age of Onset  . Asthma Mother   . Diabetes Father   . Cancer Father        prostate  . Osteoarthritis Sister   . Diabetes Sister     Social History   Tobacco Use  . Smoking status: Never Smoker  . Smokeless tobacco: Never Used  Substance Use Topics  . Alcohol use: No  . Drug use: No    Home Medications Prior to Admission medications   Medication Sig Start Date End Date Taking? Authorizing Provider  albuterol (PROVENTIL HFA;VENTOLIN HFA) 108 (90 Base) MCG/ACT inhaler Inhale 1-2 puffs into the lungs every 6 (six) hours as needed for wheezing or shortness of breath. 04/09/18   Volanda Napoleon, PA-C  meloxicam (MOBIC) 15 MG tablet Take 1 tablet (15 mg total) by mouth daily as needed for pain. 03/15/19   Inez Catalina, MD  methocarbamol (ROBAXIN) 500 MG tablet Take 1 tablet (500 mg total) by mouth 2 (two) times daily. 05/25/19   Kaniya Trueheart S, PA  ondansetron (ZOFRAN ODT) 8 MG disintegrating tablet Take 1 tablet (8 mg total) by mouth every 8 (eight) hours as needed for nausea or vomiting. 04/05/19   Molpus, John, MD  psyllium (METAMUCIL SMOOTH TEXTURE) 58.6 % powder Take 1 packet by mouth 3 (three) times daily. 02/12/19   Doristine Mango L, DO    Allergies    Patient has no known allergies.  Review of Systems   Review  of Systems  Constitutional: Negative for chills and fever.  HENT: Negative for congestion.   Respiratory: Negative for shortness of breath.   Cardiovascular: Negative for chest pain.  Gastrointestinal: Negative for abdominal pain.  Musculoskeletal: Negative for neck pain.       Back pain    Physical Exam Updated Vital Signs BP (!) 148/91 (BP Location: Left Arm)   Pulse 71   Temp 98.3 F (36.8 C) (Oral)   Resp 20   Ht 5\' 8"  (1.727 m)   Wt 126.6 kg   SpO2 99%   BMI 42.42 kg/m   Physical Exam Vitals and nursing note reviewed.  Constitutional:      General: He is not in acute distress.    Appearance: Normal appearance. He  is not ill-appearing.  HENT:     Head: Normocephalic and atraumatic.     Nose: Nose normal.     Mouth/Throat:     Mouth: Mucous membranes are moist.  Eyes:     General: No scleral icterus.       Right eye: No discharge.        Left eye: No discharge.     Conjunctiva/sclera: Conjunctivae normal.  Cardiovascular:     Rate and Rhythm: Normal rate and regular rhythm.     Pulses: Normal pulses.     Heart sounds: Normal heart sounds.  Pulmonary:     Effort: Pulmonary effort is normal. No respiratory distress.     Breath sounds: No stridor. No wheezing.  Abdominal:     Palpations: Abdomen is soft.     Tenderness: There is no abdominal tenderness.  Musculoskeletal:     Cervical back: Normal range of motion.     Right lower leg: No edema.     Left lower leg: No edema.     Comments: Bilateral lower extremities with 5/5 strength in knee flexion extension, ankle flexion extension, hip flexion extension abduction abduction.  No tenderness to palpation of midline back.  There is diffuse tenderness of the thoracic back however that reproduces symptoms.  There seems to be some muscular spasm here.  Skin:    General: Skin is warm and dry.     Capillary Refill: Capillary refill takes less than 2 seconds.  Neurological:     Mental Status: He is alert and oriented to person, place, and time. Mental status is at baseline.     Comments: Sensation intact bilateral lower extremities.  Gait without any abnormality.  Bilateral intact patellar reflexes.  Psychiatric:        Mood and Affect: Mood normal.        Behavior: Behavior normal.     ED Results / Procedures / Treatments   Labs (all labs ordered are listed, but only abnormal results are displayed) Labs Reviewed - No data to display  EKG EKG Interpretation  Date/Time:  Saturday May 25 2019 09:22:49 EDT Ventricular Rate:  74 PR Interval:    QRS Duration: 96 QT Interval:  368 QTC Calculation: 409 R Axis:   -46 Text Interpretation:  Sinus rhythm Left anterior fascicular block RSR' in V1 or V2, right VCD or RVH No significant change since last tracing Confirmed by Calvert Cantor [phone]) on 05/25/2019 9:26:10 AM   Radiology DG Chest 2 View  Result Date: 05/25/2019 CLINICAL DATA:  Chest and back pain EXAM: CHEST - 2 VIEW COMPARISON:  December 12, 2018 FINDINGS: Lungs are clear. Heart size and pulmonary vascularity are normal. No adenopathy. No pneumothorax. No bone lesions. IMPRESSION:  Lungs clear.  Cardiac silhouette within normal limits. Electronically Signed   By: Lowella Grip III M.D.   On: 05/25/2019 10:50    Procedures Procedures (including critical care time)  Medications Ordered in ED Medications  ibuprofen (ADVIL) tablet 800 mg (800 mg Oral Given 05/25/19 1048)    ED Course  I have reviewed the triage vital signs and the nursing notes.  Pertinent labs & imaging results that were available during my care of the patient were reviewed by me and considered in my medical decision making (see chart for details).    MDM Rules/Calculators/A&P                      Patient is 44 year old male with no pertinent vesical history presented today for thoracic back pain which is reproducible on my exam.  No history of trauma doubt fracture.  Suspect slipped disc VS muscular injury.  Broad differential for back pain considered includes malignancy, disc herniation, spinal epidural abscess, spinal fracture, cauda equina, pyelonephritis, kidney stone, AAA, AD, pancreatitis, PE and PTX.   History without symptoms of urinary or stool retention or incontinence, neurologic changes such as sensation change or weakness lower extremities, coagulopathy or blood thinner use, is not elderly or with history of osteoporosis, denies any history of cancer, fever, IV drug use, weight changes (unexplained), or prolonged steroid use.   Physical exam most consistent with muscular strain. Doubt cauda equina or disc herniation d/t lack of  saddle anesthesia/bowel or bladder incontinence or urinary retention, normal gait and reassuring physical examination without neurologic deficits.   History is not supportive of kidney stone, AAA, AD, pancreatitis, PE or PTX. Patient has no CVA tenderness or urinary sx to suggest pyelonephritis or kidney stone.   X-ray of chest 2 view obtained which provides lateral and AP view of thoracic spine.  There is no acute abnormality found on x-ray.  I agree with radiology read.  Will manage patient conservatively at this time. NSAIDs, back exercises/stretches, heat therapy and follow up with PCP if symptoms do not resolve in 3-4 weeks. Patient offered muscle relaxer for comfort at night. Counseled on need to return to ED for fever, worsening or concerning symptoms. Patient agreeable to plan and states understanding of follow up plans and return precautions.   Vitals WNL at time of discharge and patient is no acute distress  Patient will be conservatively treated at this time and will follow up with PCP for further evaluation.  X-ray obtained after discussion of managing physician and consultation with patient.  No negation for emergent MRI at this time.  Patient is agreeable to conservative management will follow up with his PCP.  Given return precautions.  Final Clinical Impression(s) / ED Diagnoses Final diagnoses:  Acute midline thoracic back pain    Rx / DC Orders ED Discharge Orders         Ordered    methocarbamol (ROBAXIN) 500 MG tablet  2 times daily     05/25/19 [address] [place], [place] 05/25/19 Bosie Helper    Truddie Hidden, MD 05/25/19 1949

## 2019-07-12 ENCOUNTER — Encounter (HOSPITAL_BASED_OUTPATIENT_CLINIC_OR_DEPARTMENT_OTHER): Payer: Self-pay

## 2019-07-12 ENCOUNTER — Emergency Department (HOSPITAL_BASED_OUTPATIENT_CLINIC_OR_DEPARTMENT_OTHER)
Admission: EM | Admit: 2019-07-12 | Discharge: 2019-07-12 | Disposition: A | Discharge status: Home / Self Care | Payer: Self-pay | Attending: Emergency Medicine | Admitting: Emergency Medicine

## 2019-07-12 ENCOUNTER — Other Ambulatory Visit: Payer: Self-pay

## 2019-07-12 DIAGNOSIS — B9689 Other specified bacterial agents as the cause of diseases classified elsewhere: Secondary | ICD-10-CM | POA: Insufficient documentation

## 2019-07-12 DIAGNOSIS — L0201 Cutaneous abscess of face: Secondary | ICD-10-CM | POA: Insufficient documentation

## 2019-07-12 MED ORDER — DOXYCYCLINE HYCLATE 100 MG PO TABS
100.0000 mg | ORAL_TABLET | Freq: Once | ORAL | Status: AC
  Administered 2019-07-12: 100 mg via ORAL
  Filled 2019-07-12: qty 1

## 2019-07-12 MED ORDER — DOXYCYCLINE HYCLATE 100 MG PO CAPS
100.0000 mg | ORAL_CAPSULE | Freq: Two times a day (BID) | ORAL | 0 refills | Status: DC
Start: 2019-07-12 — End: 2019-09-05

## 2019-07-12 NOTE — ED Provider Notes (Signed)
[facility] DEPT [facility] Provider Note: Georgena Spurling, MD, FACEP  CSN: [id number] MRN: [id number] ARRIVAL: 07/12/19 at [place]: [place]   HISTORY OF PRESENT ILLNESS  07/12/19 4:45 AM Derrick Scott is a 44 y.o. male with right-sided facial swelling after suspected insect bite earlier this morning.  He rates associated pain is a 3 out of 10, worse with palpation.  The swelling is localized with overlying crust.  There is associated pain and swelling of his right jugulodigastric lymph node.  He denies fever or chills.   Past Medical History:  Diagnosis Date  . Medial meniscus tear 01/2012   right knee  . Obesity   . Pneumonia   . Renal disorder   . Vertigo, benign paroxysmal 2015    Past Surgical History:  Procedure Laterality Date  . CORNEAL TRANSPLANT  12/2009  . KNEE ARTHROSCOPY  02/20/2012   Procedure: ARTHROSCOPY KNEE;  Surgeon: Alta Corning, MD;  Location: [facility];  Service: Orthopedics;  Laterality: Right;  Chondroplasia patella femoral joint, medial meniscal debriedment    Family History  Problem Relation Age of Onset  . Asthma Mother   . Diabetes Father   . Cancer Father        prostate  . Osteoarthritis Sister   . Diabetes Sister     Social History   Tobacco Use  . Smoking status: Never Smoker  . Smokeless tobacco: Never Used  Vaping Use  . Vaping Use: Never used  Substance Use Topics  . Alcohol use: No  . Drug use: No    Prior to Admission medications   Medication Sig Start Date End Date Taking? Authorizing Provider  doxycycline (VIBRAMYCIN) 100 MG capsule Take 1 capsule (100 mg total) by mouth 2 (two) times daily. One po bid x 7 days 07/12/19   Alasha Mcguinness, MD  albuterol (PROVENTIL HFA;VENTOLIN HFA) 108 (90 Base) MCG/ACT inhaler Inhale 1-2 puffs into the lungs every 6 (six) hours as needed for wheezing or shortness of breath. 04/09/18 07/12/19  Volanda Napoleon, PA-C    Allergies Patient  has no known allergies.   REVIEW OF SYSTEMS  Negative except as noted here or in the History of Present Illness.   PHYSICAL EXAMINATION  Initial Vital Signs Blood pressure (!) 142/94, pulse 72, temperature 98.1 F (36.7 C), temperature source Oral, resp. rate 14, height 5\' 8"  (1.727 m), weight 122.5 kg, SpO2 100 %.  Examination General: Well-developed, well-nourished male in no acute distress; appearance consistent with age of record HENT: normocephalic; tender, indurated, crusted but nonfluctuant lesion right cheek:    Eyes: pupils equal, round and reactive to light; extraocular muscles intact Neck: supple; enlarged, tender right jugulodigastric node Heart: regular rate and rhythm Lungs: clear to auscultation bilaterally Abdomen: soft; nondistended; nontender; bowel sounds present Extremities: No deformity; full range of motion Neurologic: Awake, alert and oriented; motor function intact in all extremities and symmetric; no facial droop Skin: Warm and dry Psychiatric: Normal mood and affect   RESULTS  Summary of this visit's results, reviewed and interpreted by myself:   EKG Interpretation  Date/Time:    Ventricular Rate:    PR Interval:    QRS Duration:   QT Interval:    QTC Calculation:   R Axis:     Text Interpretation:        Laboratory Studies: No results found for this or any previous visit (from the past 24 hour(s)). Imaging Studies: No results  found.  ED COURSE and MDM  Nursing notes, initial and subsequent vitals signs, including pulse oximetry, reviewed and interpreted by myself.  Vitals:   07/12/19 0025 07/12/19 0253  BP: 132/83 (!) 142/94  Pulse: 81 72  Resp: 14 14  Temp: 98.7 F (37.1 C) 98.1 F (36.7 C)  TempSrc: Oral Oral  SpO2: 97% 100%  Weight: 122.5 kg   Height: 5\' 8"  (1.727 m)    Medications  doxycycline (VIBRA-TABS) tablet 100 mg (has no administration in time range)    I suspect this lesion is a facial abscess and not an  insect bite.  The patient admits he did not actually see or feel an insect bite.  It is likely this came up gradually and he only noticed it recently.  We will treat for an abscess with antibiotics as this is his face and we wish to avoid a scar.  He was advised that if symptoms worsen or persist for several days he should come back and we will entertain incision and drainage at that time.  PROCEDURES  Procedures   ED DIAGNOSES     ICD-10-CM   1. Abscess of right external cheek  L02.01        Noriah Osgood, Jenny Reichmann, MD 07/12/19 [id number]

## 2019-07-12 NOTE — ED Triage Notes (Signed)
Pt presents with R side of facial swelling after a suspected insect bite this AM. Pt has obvious swelling of the R cheek.

## 2019-07-17 LAB — AEROBIC/ANAEROBIC CULTURE W GRAM STAIN (SURGICAL/DEEP WOUND)
Culture: NORMAL
Gram Stain: NONE SEEN

## 2019-07-19 ENCOUNTER — Telehealth: Payer: Self-pay | Admitting: *Deleted

## 2019-07-19 NOTE — Telephone Encounter (Signed)
Post ED Visit - Positive Culture Follow-up  Culture report reviewed by antimicrobial stewardship pharmacist: [facility] Team []  Elenor Quinones, Pharm.D. []  Heide Guile, Pharm.D., BCPS AQ-ID []  Parks Neptune, Pharm.D., BCPS []  Alycia Rossetti, Pharm.D., BCPS []  [place], [place].D., BCPS, AAHIVP []  Legrand Como, Pharm.D., BCPS, AAHIVP []  Salome Arnt, PharmD, BCPS []  Johnnette Gourd, PharmD, BCPS []  Hughes Better, PharmD, BCPS []  Leeroy Cha, PharmD []  Laqueta Linden, PharmD, BCPS []  Albertina Parr, PharmD  [place] Team []  Leodis Sias, PharmD []  Lindell Spar, PharmD []  Royetta Asal, PharmD []  Graylin Shiver, Rph []  Rema Fendt) Glennon Mac, PharmD []  Arlyn Dunning, PharmD []  Netta Cedars, PharmD []  Dia Sitter, PharmD []  Leone Haven, PharmD []  Gretta Arab, PharmD []  Theodis Shove, PharmD []  Peggyann Juba, PharmD []  Reuel Boom, PharmD   Positive wound culture, reviewed by Arturo Morton, PharmD Treated with Doxycycline, organism sensitive to the same and no further patient follow-up is required at this time.  Harlon Flor [facility] 07/19/2019, 1:00 PM

## 2019-07-20 ENCOUNTER — Emergency Department (HOSPITAL_BASED_OUTPATIENT_CLINIC_OR_DEPARTMENT_OTHER)
Admission: EM | Admit: 2019-07-20 | Discharge: 2019-07-20 | Disposition: A | Discharge status: Home / Self Care | Payer: Self-pay | Attending: Emergency Medicine | Admitting: Emergency Medicine

## 2019-07-20 ENCOUNTER — Encounter (HOSPITAL_BASED_OUTPATIENT_CLINIC_OR_DEPARTMENT_OTHER): Payer: Self-pay | Admitting: Emergency Medicine

## 2019-07-20 ENCOUNTER — Other Ambulatory Visit: Payer: Self-pay

## 2019-07-20 DIAGNOSIS — L089 Local infection of the skin and subcutaneous tissue, unspecified: Secondary | ICD-10-CM | POA: Insufficient documentation

## 2019-07-20 NOTE — Discharge Instructions (Signed)
Please continue to apply topical bacitracin to the affected area.  I would also like for you to apply warm compresses 4-5 times daily to help facilitate continued drainage and wound healing.  Please follow-up with your primary care provider, Dr. Nori Riis, regarding today's encounter.  Your infection appears to be healing well and there is no significant evidence of an abscess that would otherwise be amenable to drainage.  Return to the ED or seek immediate medical attention should experience any new or worsening symptoms.  Have fun at your wedding!

## 2019-07-20 NOTE — ED Provider Notes (Signed)
[facility] EMERGENCY DEPARTMENT Provider Note   CSN: [id number] Arrival date & time: 07/20/19  1154     History Chief Complaint  Patient presents with  . Abscess    Derrick Scott is a 44 y.o. male with no relevant past medical history presents the ED with complaints of right-sided facial abscess.  I reviewed patient's medical record and he was evaluated on 07/12/2019 for similar complaints.  Culture was obtained and patient was ultimately discharged home with doxycycline without incision and drainage with instructions to return to the ED should symptoms worsen or fail to improve.  Patient reports that he has noticed significant improvement with the doxycycline, however his lesion has not fully resolved which prompted him to come to the ED for evaluation.  He is getting married in 7 days and is afraid that it will interfere with photographs.  He also felt as though it may be spreading towards his eye, but denies any overlying skin changes or discomfort.  No fevers or chills, changes in visual acuity, conjunctivitis, new swelling or redness, or other symptoms.  He has been applying topical bacitracin in addition to the doxycycline and has been doing warm compresses once daily.  HPI     Past Medical History:  Diagnosis Date  . Medial meniscus tear 01/2012   right knee  . Obesity   . Pneumonia   . Renal disorder   . Vertigo, benign paroxysmal 2015    Patient Active Problem List   Diagnosis Date Noted  . Internal hemorrhoids without complication 33/82/5053  . COVID-19 virus infection 12/11/2018  . Urethritis 03/06/2018  . 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 Other tear of medial meniscus, current injury, left knee, subsequent encounter 05/04/2017  . Pes planus 02/07/2017  . Laryngopharyngeal reflux (LPR) 07/24/2015  . Post corneal transplant 07/25/2014  . 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 Benign paroxysmal  positional vertigo 08/30/2013  . Adrenal mass, right ([place]) 05/22/2013  . Sleep apnea 07/31/2009  . OBESITY, NOS 03/16/2006    Past Surgical History:  Procedure Laterality Date  . CORNEAL TRANSPLANT  12/2009  . KNEE ARTHROSCOPY  02/20/2012   Procedure: ARTHROSCOPY KNEE;  Surgeon: Alta Corning, MD;  Location: [facility];  Service: Orthopedics;  Laterality: Right;  Chondroplasia patella femoral joint, medial meniscal debriedment       Family History  Problem Relation Age of Onset  . Asthma Mother   . Diabetes Father   . Cancer Father        prostate  . Osteoarthritis Sister   . Diabetes Sister     Social History   Tobacco Use  . Smoking status: Never Smoker  . Smokeless tobacco: Never Used  Vaping Use  . Vaping Use: Never used  Substance Use Topics  . Alcohol use: No  . Drug use: No    Home Medications Prior to Admission medications   Medication Sig Start Date End Date Taking? Authorizing Provider  doxycycline (VIBRAMYCIN) 100 MG capsule Take 1 capsule (100 mg total) by mouth 2 (two) times daily. One po bid x 7 days 07/12/19   Molpus, John, MD  albuterol (PROVENTIL HFA;VENTOLIN HFA) 108 (90 Base) MCG/ACT inhaler Inhale 1-2 puffs into the lungs every 6 (six) hours as needed for wheezing or shortness of breath. 04/09/18 07/12/19  Volanda Napoleon, PA-C    Allergies    Patient has no known allergies.  Review of Systems  Review of Systems  Constitutional: Negative for fever.  Eyes: Negative for pain, redness and visual disturbance.  Skin: Positive for wound.  Neurological: Negative for facial asymmetry and numbness.    Physical Exam Updated Vital Signs BP 121/86 (BP Location: Left Arm)   Pulse 88   Temp 98.9 F (37.2 C) (Oral)   Resp 18   Ht 5\' 8"  (1.727 m)   Wt 122.9 kg   SpO2 100%   BMI 41.21 kg/m   Physical Exam Vitals and nursing note reviewed. Exam conducted with a chaperone present.  Constitutional:      Appearance: Normal appearance.   HENT:     Head: Normocephalic.     Comments: No right-sided facial swelling or asymmetry is appreciated.  There is an area on his right cheek, approximately 2 x 2 centimeter, that appears to be a well healing area of infection.  No fluctuance appreciated.  No drainable abscess.  No surrounding induration or significant erythema concerning for cellulitis. Eyes:     General: No scleral icterus.    Conjunctiva/sclera: Conjunctivae normal.     Comments: PERRL and EOM intact.  Conjunctival are normal.  No pain with EOMs.  No periorbital swelling or erythema.  No periorbital induration or TTP.  Cardiovascular:     Rate and Rhythm: Normal rate and regular rhythm.     Pulses: Normal pulses.     Heart sounds: Normal heart sounds.  Pulmonary:     Effort: Pulmonary effort is normal. No respiratory distress.     Breath sounds: Normal breath sounds.  Musculoskeletal:     Cervical back: Normal range of motion and neck supple. No rigidity.  Skin:    General: Skin is dry.  Neurological:     Mental Status: He is alert.     GCS: GCS eye subscore is 4. GCS verbal subscore is 5. GCS motor subscore is 6.  Psychiatric:        Mood and Affect: Mood normal.        Behavior: Behavior normal.        Thought Content: Thought content normal.         ED Results / Procedures / Treatments   Labs (all labs ordered are listed, but only abnormal results are displayed) Labs Reviewed - No data to display  EKG None  Radiology No results found.  Procedures Procedures (including critical care time)  Medications Ordered in ED Medications - No data to display  ED Course  I have reviewed the triage vital signs and the nursing notes.  Pertinent labs & imaging results that were available during my care of the patient were reviewed by me and considered in my medical decision making (see chart for details).    MDM Rules/Calculators/A&P                          Patient is being the ED for subsequent  encounter for right-sided facial abscess.  Aerobic/anaerobic culture was obtained 8 days ago and is reviewed.  No abnormalities.  Patient's facial abscess/cellulitis was likely susceptible to doxycycline which is why he has noted significant improvement.    While I can appreciate patient's concern given his upcoming wedding in 7 days, he has no appreciable abscess amenable to drainage here in the ED.  There is no spreading erythema or induration concerning for bacterial infection that was resistant to his already prescribed antibiotics.  I am encouraging him to continue applying topical bacitracin twice  daily and encouraging him to apply warm compresses 4-5 times daily rather than once per day.  He denies any fevers and his vital signs are stable and WNL.  He is not ill-appearing.  Patient feels stable for discharge.  He plans to follow-up with his primary care provider, Dr. Nori Riis, regarding today's encounter.    All of the evaluation and work-up results were discussed with the patient and any family at bedside.  Patient and/or family were informed that while patient is appropriate for discharge at this time, some medical emergencies may only develop or become detectable after a period of time.  I specifically instructed patient and/or family to return to return to the ED or seek immediate medical attention for any new or worsening symptoms.  They were provided opportunity to ask any additional questions and have none at this time.  Prior to discharge patient is feeling well, agreeable with plan for discharge home.  They have expressed understanding of verbal discharge instructions as well as return precautions and are agreeable to the plan.   Final Clinical Impression(s) / ED Diagnoses Final diagnoses:  Skin infection    Rx / DC Orders ED Discharge Orders    None       Corena Herter, PA-C 07/20/19 1431    Gareth Morgan, MD 07/22/19 2321

## 2019-07-20 NOTE — ED Triage Notes (Signed)
Pt c/o recent insect bite on right side of face, treated with abx. Pt c/o facial pain and swelling, concern that needs draining. Pt denies fever

## 2019-09-05 ENCOUNTER — Ambulatory Visit (INDEPENDENT_AMBULATORY_CARE_PROVIDER_SITE_OTHER): Payer: Self-pay | Admitting: Family Medicine

## 2019-09-05 ENCOUNTER — Encounter: Payer: Self-pay | Admitting: Family Medicine

## 2019-09-05 ENCOUNTER — Other Ambulatory Visit: Payer: Self-pay

## 2019-09-05 ENCOUNTER — Ambulatory Visit (HOSPITAL_COMMUNITY)
Admission: RE | Admit: 2019-09-05 | Discharge: 2019-09-05 | Disposition: A | Discharge status: Home / Self Care | Payer: Self-pay | Source: Ambulatory Visit | Attending: Family Medicine | Admitting: Family Medicine

## 2019-09-05 DIAGNOSIS — R079 Chest pain, unspecified: Secondary | ICD-10-CM

## 2019-09-05 DIAGNOSIS — Z131 Encounter for screening for diabetes mellitus: Secondary | ICD-10-CM

## 2019-09-05 DIAGNOSIS — Z13220 Encounter for screening for lipoid disorders: Secondary | ICD-10-CM

## 2019-09-05 NOTE — Patient Instructions (Addendum)
Great to see you today Derrick Scott!! Your physical was normal. I think your chest pain may be related to heart burn. Please change your diet ie eat less fast foods, fried foods etc. You can take over the counter ant acids. If your symptoms still persist please follow up with your pcp. Please also follow up with your pcp about your joint pains.  I am getting cholesterol and diabetic blood test today. I will call you if the results are abnormal.  Best wishes,  Derrick Scott    Heartburn Heartburn is a type of pain or discomfort that can happen in the throat or chest. It is often described as a burning pain. It may also cause a bad, acid-like taste in the mouth. Heartburn may feel worse when you lie down or bend over, and it is often worse at night. Heartburn may be caused by stomach contents that move back up into the esophagus (reflux). Follow these instructions at home: Eating and drinking   Avoid certain foods and drinks as told by your health care provider. This may include: ? Coffee and tea (with or without caffeine). ? Drinks that contain alcohol. ? Energy drinks and sports drinks. ? Carbonated drinks or sodas. ? Chocolate and cocoa. ? Peppermint and mint flavorings. ? Garlic and onions. ? Horseradish. ? Spicy and acidic foods, including peppers, chili powder, curry powder, vinegar, hot sauces, and barbecue sauce. ? Citrus fruit juices and citrus fruits, such as oranges, lemons, and limes. ? Tomato-based foods, such as red sauce, chili, salsa, and pizza with red sauce. ? Fried and fatty foods, such as donuts, french fries, potato chips, and high-fat dressings. ? High-fat meats, such as hot dogs and fatty cuts of red and white meats, such as rib eye steak, sausage, ham, and bacon. ? High-fat dairy items, such as whole milk, butter, and cream cheese.  Eat small, frequent meals instead of large meals.  Avoid drinking large amounts of liquid with your meals.  Avoid eating meals  during the 2-3 hours before bedtime.  Avoid lying down right after you eat.  Do not exercise right after you eat. Lifestyle      If you are overweight, reduce your weight to an amount that is healthy for you. Ask your health care provider for guidance about a safe weight loss goal.  Do not use any products that contain nicotine or tobacco, such as cigarettes, e-cigarettes, and chewing tobacco. These can make your symptoms worse. If you need help quitting, ask your health care provider.  Wear loose-fitting clothing. Do not wear anything tight around your waist that causes pressure on your abdomen.  Raise (elevate) the head of your bed about 6 inches (15 cm) when you sleep.  Try to reduce your stress, such as with yoga or meditation. If you need help reducing stress, ask your health care provider. General instructions  Pay attention to any changes in your symptoms.  Take over-the-counter and prescription medicines only as told by your health care provider. ? Do not take aspirin, ibuprofen, or other NSAIDs unless your health care provider told you to do so. ? Stop medicines only as told by your health care provider. If you stop taking some medicines too quickly, your symptoms may get worse.  Keep all follow-up visits as told by your health care provider. This is important. Contact a health care provider if:  You have new symptoms.  You have unexplained weight loss.  You have difficulty swallowing, or it hurts  to swallow.  You have wheezing or a persistent cough.  Your symptoms do not improve with treatment.  You have frequent heartburn for more than 2 weeks. Get help right away if:  You have pain in your arms, neck, jaw, teeth, or back.  You feel sweaty, dizzy, or light-headed.  You have chest pain or shortness of breath.  You vomit and your vomit looks like blood or coffee grounds.  Your stool is bloody or black. These symptoms may represent a serious problem that is  an emergency. Do not wait to see if the symptoms will go away. Get medical help right away. Call your local emergency services (911 in the [place] Do not drive yourself to the hospital. Summary  Heartburn is a type of pain or discomfort that can happen in the throat or chest. It is often described as a burning pain. It may also cause a bad, acid-like taste in the mouth.  Avoid certain foods and drinks as told by your health care provider.  Take over-the-counter and prescription medicines only as told by your health care provider. Do not take aspirin, ibuprofen, or other NSAIDs unless your health care provider told you to do so.  Contact a health care provider if your symptoms do not improve or they get worse. This information is not intended to replace advice given to you by your health care provider. Make sure you discuss any questions you have with your health care provider. Document Revised: 06/05/2017 Document Reviewed: 06/05/2017 Elsevier Patient Education  [name].

## 2019-09-06 ENCOUNTER — Encounter: Payer: Self-pay | Admitting: Family Medicine

## 2019-09-06 DIAGNOSIS — Z131 Encounter for screening for diabetes mellitus: Secondary | ICD-10-CM | POA: Insufficient documentation

## 2019-09-06 DIAGNOSIS — R079 Chest pain, unspecified: Secondary | ICD-10-CM | POA: Insufficient documentation

## 2019-09-06 DIAGNOSIS — Z13220 Encounter for screening for lipoid disorders: Secondary | ICD-10-CM | POA: Insufficient documentation

## 2019-09-06 LAB — LIPID PANEL
Chol/HDL Ratio: 4.6 ratio (ref 0.0–5.0)
Cholesterol, Total: 228 mg/dL — ABNORMAL HIGH (ref 100–199)
HDL: 50 mg/dL (ref >39)
LDL Chol Calc (NIH): 159 mg/dL — ABNORMAL HIGH (ref 0–99)
Triglycerides: 107 mg/dL (ref 0–149)
VLDL Cholesterol Cal: 19 mg/dL (ref 5–40)

## 2019-09-06 NOTE — Progress Notes (Addendum)
    SUBJECTIVE:   CHIEF COMPLAINT / HPI:   Derrick Scott is a 44 yr old male who presents today for a physical. His main concerns today are intermittent chest pain  Chest pain  Patient endorses 1 to 63-month history of right and left-sided chest pain.  There are some dull and sharp in nature.  Denies radiation to left jaw, left arm or teeth.  Occurs when he is at rest.  Denies any chest pain on exertion or when working out.  This pain lasts about 30 seconds to a few minutes.  Severity is 3 out of 10.  Denies nausea, sweating shortness of breath, dizziness, palpitations etc.  Reports that the chest pain goes away when he takes a deep breath in.  Has associated indigestion.  He initially was taking Tums but started taking another over-the-counter medicine which she does not know the name of it.  Denies cardiac history, history of PEs, family history of sudden cardiac death.  PERTINENT  PMH / PSH: Obesity, internal hemorrhoids  OBJECTIVE:   BP 112/70   Pulse 97   Ht 5\' 8"  (1.727 m)   Wt 290 lb (131.5 kg)   SpO2 99%   BMI 44.09 kg/m    General: Alert, no acute distress Cardio: Normal S1 and S2, RRR Pulm: CTAB, Normal respiratory effort Abdomen: Bowel sounds normal. Abdomen soft and non-tender.  Extremities: No peripheral edema.  MSK: no chest pain on palpation of anterior rib cage  Neuro: Cranial nerves grossly intact  ASSESSMENT/PLAN:   Chest pain Chest pain is most likely secondary to GERD. Low suspicion for cardiac cause as not related to exertion.  EKG in clinic today showed sinus rhythm with no ischemic changes.  Low suspicion for musculoskeletal pain as nontender on palpation of anterior rib cage.  Recommended that patient modifies his diet to less fast foods and foods which can worsen GERD such as spicy foods.  Recommended he can continue Tums and the other over-the-counter medicines that he started.  Can follow-up with PCP if symptoms persist after diet modification.   Precautions given regarding chest pain.  Screening cholesterol level Patient would like lipid panel taken today. I ordered lipid panel given his BMI of 44.  We will contact him regarding the results.  Diabetes mellitus screening Obtained A1c today.  Will inform patient of results.     Lattie Haw, MD [place]

## 2019-09-06 NOTE — Assessment & Plan Note (Addendum)
Patient would like lipid panel taken today. I ordered lipid panel given his BMI of 44.  We will contact him regarding the results.

## 2019-09-06 NOTE — Assessment & Plan Note (Addendum)
Chest pain is most likely secondary to GERD. Low suspicion for cardiac cause as not related to exertion.  EKG in clinic today showed sinus rhythm with no ischemic changes.  Low suspicion for musculoskeletal pain as nontender on palpation of anterior rib cage.  Recommended that patient modifies his diet to less fast foods and foods which can worsen GERD such as spicy foods.  Recommended he can continue Tums and the other over-the-counter medicines that he started.  Can follow-up with PCP if symptoms persist after diet modification.  Precautions given regarding chest pain.

## 2019-09-06 NOTE — Assessment & Plan Note (Signed)
Obtained A1c today.  Will inform patient of results.

## 2019-09-09 ENCOUNTER — Ambulatory Visit: Payer: Self-pay

## 2019-09-13 ENCOUNTER — Ambulatory Visit: Payer: Self-pay | Admitting: Family Medicine

## 2019-09-16 ENCOUNTER — Other Ambulatory Visit: Payer: Self-pay | Admitting: Family Medicine

## 2019-09-16 MED ORDER — OMEPRAZOLE 20 MG PO CPDR
20.0000 mg | DELAYED_RELEASE_CAPSULE | Freq: Every day | ORAL | 0 refills | Status: DC
Start: 2019-09-16 — End: 2020-04-29

## 2019-10-21 ENCOUNTER — Other Ambulatory Visit: Payer: Self-pay

## 2019-10-21 ENCOUNTER — Emergency Department (HOSPITAL_BASED_OUTPATIENT_CLINIC_OR_DEPARTMENT_OTHER): Payer: Self-pay

## 2019-10-21 ENCOUNTER — Emergency Department (HOSPITAL_BASED_OUTPATIENT_CLINIC_OR_DEPARTMENT_OTHER)
Admission: EM | Admit: 2019-10-21 | Discharge: 2019-10-21 | Disposition: A | Discharge status: Home / Self Care | Payer: Self-pay | Attending: Emergency Medicine | Admitting: Emergency Medicine

## 2019-10-21 ENCOUNTER — Encounter (HOSPITAL_BASED_OUTPATIENT_CLINIC_OR_DEPARTMENT_OTHER): Payer: Self-pay | Admitting: Emergency Medicine

## 2019-10-21 DIAGNOSIS — R079 Chest pain, unspecified: Secondary | ICD-10-CM | POA: Insufficient documentation

## 2019-10-21 DIAGNOSIS — Z20822 Contact with and (suspected) exposure to covid-19: Secondary | ICD-10-CM | POA: Insufficient documentation

## 2019-10-21 DIAGNOSIS — J069 Acute upper respiratory infection, unspecified: Secondary | ICD-10-CM | POA: Insufficient documentation

## 2019-10-21 DIAGNOSIS — Z8616 Personal history of COVID-19: Secondary | ICD-10-CM | POA: Insufficient documentation

## 2019-10-21 DIAGNOSIS — Z85831 Personal history of malignant neoplasm of soft tissue: Secondary | ICD-10-CM | POA: Insufficient documentation

## 2019-10-21 LAB — CBC WITH DIFFERENTIAL/PLATELET
Abs Immature Granulocytes: 0.02 10*3/uL (ref 0.00–0.07)
Basophils Absolute: 0.1 10*3/uL (ref 0.0–0.1)
Basophils Relative: 1 %
Eosinophils Absolute: 0.1 10*3/uL (ref 0.0–0.5)
Eosinophils Relative: 1 %
HCT: 43.2 % (ref 39.0–52.0)
Hemoglobin: 14.4 g/dL (ref 13.0–17.0)
Immature Granulocytes: 0 %
Lymphocytes Relative: 45 %
Lymphs Abs: 3.4 10*3/uL (ref 0.7–4.0)
MCH: 29 pg (ref 26.0–34.0)
MCHC: 33.3 g/dL (ref 30.0–36.0)
MCV: 86.9 fL (ref 80.0–100.0)
Monocytes Absolute: 0.5 10*3/uL (ref 0.1–1.0)
Monocytes Relative: 7 %
Neutro Abs: 3.5 10*3/uL (ref 1.7–7.7)
Neutrophils Relative %: 46 %
Platelets: 255 10*3/uL (ref 150–400)
RBC: 4.97 MIL/uL (ref 4.22–5.81)
RDW: 13.1 % (ref 11.5–15.5)
WBC: 7.6 10*3/uL (ref 4.0–10.5)
nRBC: 0 % (ref 0.0–0.2)

## 2019-10-21 LAB — COMPREHENSIVE METABOLIC PANEL
ALT: 33 U/L (ref 0–44)
AST: 31 U/L (ref 15–41)
Albumin: 3.6 g/dL (ref 3.5–5.0)
Alkaline Phosphatase: 71 U/L (ref 38–126)
Anion gap: 10 (ref 5–15)
BUN: 13 mg/dL (ref 6–20)
CO2: 24 mmol/L (ref 22–32)
Calcium: 8.4 mg/dL — ABNORMAL LOW (ref 8.9–10.3)
Chloride: 103 mmol/L (ref 98–111)
Creatinine, Ser: 1.04 mg/dL (ref 0.61–1.24)
GFR calc Af Amer: >60 mL/min (ref >60)
GFR calc non Af Amer: >60 mL/min (ref >60)
Glucose, Bld: 124 mg/dL — ABNORMAL HIGH (ref 70–99)
Potassium: 4 mmol/L (ref 3.5–5.1)
Sodium: 137 mmol/L (ref 135–145)
Total Bilirubin: 0.4 mg/dL (ref 0.3–1.2)
Total Protein: 7.2 g/dL (ref 6.5–8.1)

## 2019-10-21 LAB — RESPIRATORY PANEL BY RT PCR (FLU A&B, COVID)
Influenza A by PCR: NEGATIVE
Influenza B by PCR: NEGATIVE
SARS Coronavirus 2 by RT PCR: NEGATIVE

## 2019-10-21 LAB — TROPONIN I (HIGH SENSITIVITY)
Troponin I (High Sensitivity): 6 ng/L (ref <18)
Troponin I (High Sensitivity): 6 ng/L (ref <18)

## 2019-10-21 LAB — GROUP A STREP BY PCR: Group A Strep by PCR: NOT DETECTED

## 2019-10-21 NOTE — ED Provider Notes (Signed)
[facility] EMERGENCY DEPARTMENT Provider Note   CSN: [id number] Arrival date & time: 10/21/19  0801     History Chief Complaint  Patient presents with  . Chest Pain    Derrick Scott is a 44 y.o. male.  HPI      Woke up with sore throat, chest burning Chest pain feels like a sharp pain in middle, left side like burning Does not radiate Sore throat on right side, no other neck/jaw pain No shortness of breath, nausea, vomiting No leg pain or swelling No fevers Yesterday cough occasional Yesterday had some elbow burning Congestion yesterday No diaphoresis  No smoking, etoh, no other drugs Dad has congestive heart failure, no hx of MI    Past Medical History:  Diagnosis Date  . Medial meniscus tear 01/2012   right knee  . Obesity   . Pneumonia   . Renal disorder   . Vertigo, benign paroxysmal 2015    Patient Active Problem List   Diagnosis Date Noted  . Chest pain 09/06/2019  . Screening cholesterol level 09/06/2019  . Diabetes mellitus screening 09/06/2019  . Internal hemorrhoids without complication 90/24/0973  . COVID-19 virus infection 12/11/2018  . Urethritis 03/06/2018  . 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 Other tear of medial meniscus, current injury, left knee, subsequent encounter 05/04/2017  . Pes planus 02/07/2017  . Laryngopharyngeal reflux (LPR) 07/24/2015  . Post corneal transplant 07/25/2014  . 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 Benign paroxysmal positional vertigo 08/30/2013  . Adrenal mass, right ([place]) 05/22/2013  . Sleep apnea 07/31/2009  . OBESITY, NOS 03/16/2006    Past Surgical History:  Procedure Laterality Date  . CORNEAL TRANSPLANT  12/2009  . KNEE ARTHROSCOPY  02/20/2012   Procedure: ARTHROSCOPY KNEE;  Surgeon: Alta Corning, MD;  Location: [facility];  Service: Orthopedics;  Laterality: Right;  Chondroplasia patella femoral joint, medial  meniscal debriedment       Family History  Problem Relation Age of Onset  . Asthma Mother   . Diabetes Father   . Cancer Father        prostate  . Osteoarthritis Sister   . Diabetes Sister     Social History   Tobacco Use  . Smoking status: Never Smoker  . Smokeless tobacco: Never Used  Vaping Use  . Vaping Use: Never used  Substance Use Topics  . Alcohol use: No  . Drug use: No    Home Medications Prior to Admission medications   Medication Sig Start Date End Date Taking? Authorizing Provider  omeprazole (PRILOSEC) 20 MG capsule Take 1 capsule (20 mg total) by mouth daily. 09/16/19   Dickie La, MD  albuterol (PROVENTIL HFA;VENTOLIN HFA) 108 (90 Base) MCG/ACT inhaler Inhale 1-2 puffs into the lungs every 6 (six) hours as needed for wheezing or shortness of breath. 04/09/18 07/12/19  Volanda Napoleon, PA-C    Allergies    Shrimp [shellfish allergy]  Review of Systems   Review of Systems  Constitutional: Positive for fatigue. Negative for fever.  HENT: Positive for congestion and sore throat.   Eyes: Negative for visual disturbance.  Respiratory: Positive for cough. Negative for shortness of breath.   Cardiovascular: Positive for chest pain. Negative for leg swelling.  Gastrointestinal: Negative for abdominal pain, nausea and vomiting.  Genitourinary: Negative for difficulty urinating.  Musculoskeletal: Negative for back pain and neck stiffness.  Skin: Negative for rash.  Neurological: Positive  for headaches (yesterday). Negative for syncope.    Physical Exam Updated Vital Signs BP 138/79 (BP Location: Right Arm)   Pulse 87   Temp 98.3 F (36.8 C) (Oral)   Resp 16   Ht 5\' 8"  (1.727 m)   Wt 129.7 kg   SpO2 99%   BMI 43.49 kg/m   Physical Exam Vitals and nursing note reviewed.  Constitutional:      General: He is not in acute distress.    Appearance: He is well-developed. He is not diaphoretic.  HENT:     Head: Normocephalic and atraumatic.  Eyes:       Conjunctiva/sclera: Conjunctivae normal.  Cardiovascular:     Rate and Rhythm: Normal rate and regular rhythm.     Heart sounds: Normal heart sounds. No murmur heard.  No friction rub. No gallop.   Pulmonary:     Effort: Pulmonary effort is normal. No respiratory distress.     Breath sounds: Normal breath sounds. No wheezing or rales.  Abdominal:     General: There is no distension.     Palpations: Abdomen is soft.     Tenderness: There is no abdominal tenderness. There is no guarding.  Musculoskeletal:     Cervical back: Normal range of motion.  Skin:    General: Skin is warm and dry.  Neurological:     Mental Status: He is alert and oriented to person, place, and time.     ED Results / Procedures / Treatments   Labs (all labs ordered are listed, but only abnormal results are displayed) Labs Reviewed  COMPREHENSIVE METABOLIC PANEL - Abnormal; Notable for the following components:      Result Value   Glucose, Bld 124 (*)    Calcium 8.4 (*)    All other components within normal limits  RESPIRATORY PANEL BY RT PCR (FLU A&B, COVID)  GROUP A STREP BY PCR  CBC WITH DIFFERENTIAL/PLATELET  TROPONIN I (HIGH SENSITIVITY)  TROPONIN I (HIGH SENSITIVITY)    EKG EKG Interpretation  Date/Time:  Monday October 21 2019 08:16:44 EDT Ventricular Rate:  87 PR Interval:    QRS Duration: 95 QT Interval:  345 QTC Calculation: 415 R Axis:   -51 Text Interpretation: Sinus rhythm Left anterior fascicular block Abnormal R-wave progression, late transition Borderline T abnormalities, inferior leads No significant change since last tracing Confirmed by Gareth Morgan [phone]) on 10/21/2019 9:56:04 AM   Radiology DG Chest Portable 1 View  Result Date: 10/21/2019 CLINICAL DATA:  Chest pain. EXAM: PORTABLE CHEST 1 VIEW COMPARISON:  May 25, 2019. FINDINGS: The heart size and mediastinal contours are within normal limits. Both lungs are clear. No pneumothorax or pleural effusion is noted. The  visualized skeletal structures are unremarkable. IMPRESSION: No active disease. Electronically Signed   By: Marijo Conception M.D.   On: 10/21/2019 08:12    Procedures Procedures (including critical care time)  Medications Ordered in ED Medications - No data to display  ED Course  I have reviewed the triage vital signs and the nursing notes.  Pertinent labs & imaging results that were available during my care of the patient were reviewed by me and considered in my medical decision making (see chart for details).    MDM Rules/Calculators/A&P                          44yo male with history above presents with concern for chest pain, sore throat, cough and congestion.  No  shortness of breath, hypoxia, asymmetric leg swelling, doubt PE. EKG without changes in comparison to prior. Delta troponin negative no sign of ACS.  No significant electrolyte abnormalities.   Strep and COVID testing negative.  Likely viral URI, possible msk pain. No sign of pericarditis. Given combination of symptoms feel outpt evaluation appropriate for atypical chest pain. Patient discharged in stable condition with understanding of reasons to return.    Final Clinical Impression(s) / ED Diagnoses Final diagnoses:  Chest pain, unspecified type  Upper respiratory tract infection, unspecified type    Rx / DC Orders ED Discharge Orders    None       Gareth Morgan, MD 10/21/19 2148

## 2019-10-21 NOTE — ED Triage Notes (Signed)
Pt here with generalized chest pain with radiation to right side of neck starting this morning.

## 2019-10-23 ENCOUNTER — Other Ambulatory Visit: Payer: Self-pay | Admitting: Family Medicine

## 2019-10-23 MED ORDER — ALBUTEROL SULFATE HFA 108 (90 BASE) MCG/ACT IN AERS: 1.0000 | INHALATION_SPRAY | Freq: Four times a day (QID) | RESPIRATORY_TRACT | 5 refills | Status: DC | PRN

## 2019-10-24 ENCOUNTER — Other Ambulatory Visit: Payer: Self-pay

## 2019-10-24 ENCOUNTER — Ambulatory Visit (INDEPENDENT_AMBULATORY_CARE_PROVIDER_SITE_OTHER): Payer: Self-pay | Admitting: Family Medicine

## 2019-10-24 VITALS — BP 128/78 | HR 77 | Ht 68.0 in | Wt 287.4 lb

## 2019-10-24 DIAGNOSIS — M542 Cervicalgia: Secondary | ICD-10-CM | POA: Insufficient documentation

## 2019-10-24 MED ORDER — NAPROXEN 500 MG PO TABS: 500.0000 mg | ORAL_TABLET | Freq: Two times a day (BID) | ORAL | 0 refills | Status: AC

## 2019-10-24 NOTE — Assessment & Plan Note (Signed)
Discussed etiology of patient's pain with the patient.  He has tenderness of his submandibular gland and it does appear mildly swollen compared to the left side.  He is not having purulent drainage, no fevers.  Is been present for the last 3 to 4 days.  Also has a posterior auricular lymph node that is mildly enlarged and tender, ear canals look clear and there is no evidence of acute otitis media on examination.  Throat is clear.  He has had negative COVID and negative strep test recently.  Discussed that virus could be causing his symptoms, could also consider submandibular gland inflammation.  Given short-term of symptoms and no fever, would not treat with antibiotics at this time.  The remainder of his neck pain is likely secondary to spasm caused by the way that he is holding his neck due to pain at his submandibular gland.  Will treat with naproxen twice daily for 7 days, advised heat use, and also use sour candies to try to clear out blockage of submandibular gland if present.  Return precautions discussed, advised that if pain does not improve over the next week, he has worsening of symptoms, develops fever or other complaints, especially if he starts having purulent drainage into his mouth or ear pain, he should come back right away.  He voiced understanding.  Will return to care in 1 week if no improvement.

## 2019-10-24 NOTE — Progress Notes (Signed)
    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 male in NAD HEENT: NCAT, bilateral TMs clear with external auditory canals clear and nonerythematous, no impaction of cerumen, throat clear and nonerythematous, grade 3 tonsils without exudate Neck: Tenderness palpation of right SCM, right origin of trapezius at occiput, 0.5 cm tender nodule palpated posterior to right ear, no transpalpation of right mastoid, no redness of right mastoid, mild swelling and tenderness of right submandibular gland, full range of motion in all fields, negative Spurling's and Lhermitte's Cardio: RRR no m/r/g Lungs: CTAB, no wheezing, no rhonchi, no crackles, no IWOB on RA Skin: warm and dry Neuro: Sensation intact bilateral upper extremities, 5/5 strength BUE   ASSESSMENT/PLAN:   Neck pain Discussed etiology of patient's pain with the patient.  He has tenderness of his submandibular gland and it does appear mildly swollen compared to the left side.  He is not having purulent drainage, no fevers.  Is been present for the last 3 to 4 days.  Also has a posterior auricular lymph node that is mildly enlarged and tender, ear canals look clear and there is no evidence of acute otitis media on examination.  Throat is  clear.  He has had negative COVID and negative strep test recently.  Discussed that virus could be causing his symptoms, could also consider submandibular gland inflammation.  Given short-term of symptoms and no fever, would not treat with antibiotics at this time.  The remainder of his neck pain is likely secondary to spasm caused by the way that he is holding his neck due to pain at his submandibular gland.  Will treat with naproxen twice daily for 7 days, advised heat use, and also use sour candies to try to clear out blockage of submandibular gland if present.  Return precautions discussed, advised that if pain does not improve over the next week, he has worsening of symptoms, develops fever or other complaints, especially if he starts having purulent drainage into his mouth or ear pain, he should come back right away.  He voiced understanding.  Will return to care in 1 week if no improvement.     Cleophas Dunker, [place]

## 2019-10-24 NOTE — Patient Instructions (Signed)
Thank you for coming to see me today. It was a pleasure. Today we talked about:   Your neck pain: You likely have a virus causing some lymph node swelling and also a neck spasm.  We will send in naproxen to use twice daily for one week and get some sour candies to suck on to help with the salivary gland.  If you have fevers (100.4 or higher), pain worsens, develop pus drainage, any other symptoms, or you don't get better in a week, come back.  Please follow-up in 1 week if no improvement.  If you have any questions or concerns, please do not hesitate to call the office at [phone].  Best,   [place] Constable, DO

## 2019-12-09 ENCOUNTER — Encounter (HOSPITAL_BASED_OUTPATIENT_CLINIC_OR_DEPARTMENT_OTHER): Payer: Self-pay

## 2019-12-09 ENCOUNTER — Other Ambulatory Visit: Payer: Self-pay | Admitting: Family Medicine

## 2019-12-09 ENCOUNTER — Emergency Department (HOSPITAL_BASED_OUTPATIENT_CLINIC_OR_DEPARTMENT_OTHER): Payer: Self-pay

## 2019-12-09 ENCOUNTER — Emergency Department (HOSPITAL_BASED_OUTPATIENT_CLINIC_OR_DEPARTMENT_OTHER)
Admission: EM | Admit: 2019-12-09 | Discharge: 2019-12-09 | Disposition: A | Discharge status: Home / Self Care | Payer: Self-pay | Attending: Emergency Medicine | Admitting: Emergency Medicine

## 2019-12-09 ENCOUNTER — Other Ambulatory Visit: Payer: Self-pay

## 2019-12-09 DIAGNOSIS — X500XXA Overexertion from strenuous movement or load, initial encounter: Secondary | ICD-10-CM | POA: Insufficient documentation

## 2019-12-09 DIAGNOSIS — Y990 Civilian activity done for income or pay: Secondary | ICD-10-CM | POA: Insufficient documentation

## 2019-12-09 DIAGNOSIS — M545 Low back pain, unspecified: Secondary | ICD-10-CM | POA: Insufficient documentation

## 2019-12-09 DIAGNOSIS — Z8603 Personal history of neoplasm of uncertain behavior: Secondary | ICD-10-CM | POA: Insufficient documentation

## 2019-12-09 DIAGNOSIS — Z8616 Personal history of COVID-19: Secondary | ICD-10-CM | POA: Insufficient documentation

## 2019-12-09 DIAGNOSIS — M7701 Medial epicondylitis, right elbow: Secondary | ICD-10-CM | POA: Insufficient documentation

## 2019-12-09 DIAGNOSIS — R319 Hematuria, unspecified: Secondary | ICD-10-CM

## 2019-12-09 DIAGNOSIS — R202 Paresthesia of skin: Secondary | ICD-10-CM | POA: Insufficient documentation

## 2019-12-09 LAB — CBC WITH DIFFERENTIAL/PLATELET
Abs Immature Granulocytes: 0.03 10*3/uL (ref 0.00–0.07)
Basophils Absolute: 0.1 10*3/uL (ref 0.0–0.1)
Basophils Relative: 1 %
Eosinophils Absolute: 0.2 10*3/uL (ref 0.0–0.5)
Eosinophils Relative: 2 %
HCT: 43.1 % (ref 39.0–52.0)
Hemoglobin: 14.4 g/dL (ref 13.0–17.0)
Immature Granulocytes: 0 %
Lymphocytes Relative: 49 %
Lymphs Abs: 3.4 10*3/uL (ref 0.7–4.0)
MCH: 29.1 pg (ref 26.0–34.0)
MCHC: 33.4 g/dL (ref 30.0–36.0)
MCV: 87.1 fL (ref 80.0–100.0)
Monocytes Absolute: 0.4 10*3/uL (ref 0.1–1.0)
Monocytes Relative: 6 %
Neutro Abs: 3 10*3/uL (ref 1.7–7.7)
Neutrophils Relative %: 42 %
Platelets: 272 10*3/uL (ref 150–400)
RBC: 4.95 MIL/uL (ref 4.22–5.81)
RDW: 13.2 % (ref 11.5–15.5)
WBC: 7 10*3/uL (ref 4.0–10.5)
nRBC: 0 % (ref 0.0–0.2)

## 2019-12-09 LAB — URINALYSIS, ROUTINE W REFLEX MICROSCOPIC
Bilirubin Urine: NEGATIVE
Glucose, UA: NEGATIVE mg/dL
Ketones, ur: NEGATIVE mg/dL
Leukocytes,Ua: NEGATIVE
Nitrite: NEGATIVE
Protein, ur: NEGATIVE mg/dL
Specific Gravity, Urine: >1.03 — ABNORMAL HIGH (ref 1.005–1.030)
pH: 6 (ref 5.0–8.0)

## 2019-12-09 LAB — BASIC METABOLIC PANEL
Anion gap: 7 (ref 5–15)
BUN: 10 mg/dL (ref 6–20)
CO2: 25 mmol/L (ref 22–32)
Calcium: 9.1 mg/dL (ref 8.9–10.3)
Chloride: 106 mmol/L (ref 98–111)
Creatinine, Ser: 1.03 mg/dL (ref 0.61–1.24)
GFR, Estimated: >60 mL/min (ref >60)
Glucose, Bld: 128 mg/dL — ABNORMAL HIGH (ref 70–99)
Potassium: 4 mmol/L (ref 3.5–5.1)
Sodium: 138 mmol/L (ref 135–145)

## 2019-12-09 LAB — URINALYSIS, MICROSCOPIC (REFLEX)

## 2019-12-09 MED ORDER — PREDNISONE 20 MG PO TABS
40.0000 mg | ORAL_TABLET | Freq: Every day | ORAL | 0 refills | Status: DC
Start: 2019-12-09 — End: 2020-04-29

## 2019-12-09 MED ORDER — KETOROLAC TROMETHAMINE 15 MG/ML IJ SOLN
15.0000 mg | Freq: Once | INTRAMUSCULAR | Status: AC
  Administered 2019-12-09: 15 mg via INTRAVENOUS
  Filled 2019-12-09: qty 1

## 2019-12-09 MED ORDER — SODIUM CHLORIDE 0.9 % IV BOLUS
1000.0000 mL | Freq: Once | INTRAVENOUS | Status: AC
  Administered 2019-12-09: 1000 mL via INTRAVENOUS

## 2019-12-09 MED ORDER — METHOCARBAMOL 500 MG PO TABS
500.0000 mg | ORAL_TABLET | Freq: Three times a day (TID) | ORAL | 0 refills | Status: DC | PRN
Start: 2019-12-09 — End: 2020-04-29

## 2019-12-09 NOTE — ED Notes (Signed)
C/O pain in his back that started last night while sitting. Currently a 7/10. Also C/O pain in his bilateral elbows. Reports that when he bends his right it goes "numb" like when you hit your funny bone. 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 Able to walk and move extremities with no problem.

## 2019-12-09 NOTE — Progress Notes (Signed)
Ref urology as he hadhematuria Was seen at ED

## 2019-12-09 NOTE — ED Provider Notes (Signed)
  Physical Exam  BP (!) 101/58   Pulse 80   Temp 98.2 F (36.8 C)   Resp 16   Ht 5\' 9"  (1.753 m)   Wt 127 kg   SpO2 99%   BMI 41.35 kg/m   Physical Exam  ED Course/Procedures     Procedures  MDM  Patient with back pain and elbow pain. I think most likely musculoskeletal back pain. Does have some hematuria but has had previous kidney stones. However the last 4 CT scans have been negative. Not having urinary symptoms and states this feels different than last stone. However does have hematuria will need follow-up with urology. Will treat symptomatically with some muscle relaxers. Also has likely medial epicondylitis. Patient's PCP also does sports medicine. Will have follow-up with her. Will give steroids for 3 days to help with the back and the elbow. Discharge home.       Davonna Belling, MD 12/09/19 [phone]

## 2019-12-09 NOTE — ED Provider Notes (Signed)
[facility] EMERGENCY DEPARTMENT Provider Note   CSN: [id number] Arrival date & time: 12/09/19  2426     History Chief Complaint  Patient presents with  . Back Pain    Derrick Scott is a 44 y.o. male.  HPI     This a 44 year old male with a history of obesity who presents with back pain and right elbow pain.  Patient reports over the last 2 to 3 days he has had bilateral elbow soreness.  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  No significant injury.  He states that at times when he holds his right elbow in certain positions he feels "like I hit my funny bone."  He reports tingling in his fifth digit at that time.  Has not noted any weakness in the hand.  Additionally around 3 AM he noted some left back pain.  He states that it is dull and nonradiating.  He states "I think it is my kidney and stones."  He reports a history of kidney stones which felt similar.  Denies any injury.  Denies worsening with certain movements.  Currently rates his pain at 7 out of 10.  He did take ibuprofen and was able to sleep.  He is not noted any hematuria or dysuria.  No fevers.  Chart reviewed.  He had a CT stone study in 2020 that did not show any nephrolithiasis at that time.  Past Medical History:  Diagnosis Date  . Medial meniscus tear 01/2012   right knee  . Obesity   . Pneumonia   . Renal disorder   . Vertigo, benign paroxysmal 2015    Patient Active Problem List   Diagnosis Date Noted  . Neck pain 10/24/2019  . Chest pain 09/06/2019  . Screening cholesterol level 09/06/2019  . Diabetes mellitus screening 09/06/2019  . Internal hemorrhoids without complication 83/41/9622  . COVID-19 virus infection 12/11/2018  . Urethritis 03/06/2018  . 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 Other tear of medial meniscus, current injury, left knee, subsequent encounter 05/04/2017  . Pes planus 02/07/2017  . Laryngopharyngeal reflux  (LPR) 07/24/2015  . Post corneal transplant 07/25/2014  . 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 Benign paroxysmal positional vertigo 08/30/2013  . Adrenal mass, right ([facility]) 05/22/2013  . Sleep apnea 07/31/2009  . OBESITY, NOS 03/16/2006    Past Surgical History:  Procedure Laterality Date  . CORNEAL TRANSPLANT  12/2009  . KNEE ARTHROSCOPY  02/20/2012   Procedure: ARTHROSCOPY KNEE;  Surgeon: Alta Corning, MD;  Location: [facility];  Service: Orthopedics;  Laterality: Right;  Chondroplasia patella femoral joint, medial meniscal debriedment       Family History  Problem Relation Age of Onset  . Asthma Mother   . Diabetes Father   . Cancer Father        prostate  . Osteoarthritis Sister   . Diabetes Sister     Social History   Tobacco Use  . Smoking status: Never Smoker  . Smokeless tobacco: Never Used  Vaping Use  . Vaping Use: Never used  Substance Use Topics  . Alcohol use: No  . Drug use: No    Home Medications Prior to Admission medications   Medication Sig Start Date End Date Taking? Authorizing Provider  albuterol (VENTOLIN HFA) 108 (90 Base) MCG/ACT inhaler Inhale 1-2 puffs into the lungs every 6 (six) hours as needed for wheezing or shortness of breath.  10/23/19   Dickie La, MD  omeprazole (PRILOSEC) 20 MG capsule Take 1 capsule (20 mg total) by mouth daily. 09/16/19   Dickie La, MD    Allergies    Shrimp [shellfish allergy]  Review of Systems   Review of Systems  Constitutional: Negative for fever.  Respiratory: Negative for shortness of breath.   Cardiovascular: Negative for chest pain.  Gastrointestinal: Negative for abdominal pain, nausea and vomiting.  Genitourinary: Positive for flank pain. Negative for hematuria.  Musculoskeletal: Positive for back pain.       Elbow pain  Neurological: Positive for numbness.  All other systems reviewed and are negative.   Physical Exam Updated Vital Signs BP 123/79 (BP  Location: Right Arm)   Pulse 82   Temp 98.2 F (36.8 C)   Resp 16   Ht 1.753 m (5\' 9" )   Wt 127 kg   SpO2 99%   BMI 41.35 kg/m   Physical Exam Vitals and nursing note reviewed.  Constitutional:      Appearance: He is well-developed. He is obese.  HENT:     Head: Normocephalic and atraumatic.     Nose: Nose normal.     Mouth/Throat:     Mouth: Mucous membranes are moist.  Eyes:     Pupils: Pupils are equal, round, and reactive to light.  Cardiovascular:     Rate and Rhythm: Normal rate and regular rhythm.     Heart sounds: Normal heart sounds. No murmur heard.   Pulmonary:     Effort: Pulmonary effort is normal. No respiratory distress.     Breath sounds: Normal breath sounds. No wheezing.  Abdominal:     General: Bowel sounds are normal.     Palpations: Abdomen is soft.     Tenderness: There is no abdominal tenderness. There is no right CVA tenderness, left CVA tenderness or rebound.  Musculoskeletal:     Cervical back: Neck supple.     Comments: Tenderness palpation left paraspinous musculature of the mid lower lumbar spine, no step-off deformity noted Normal range of motion of the bilateral elbows, no obvious swelling or overlying skin changes, there is tenderness to palpation over the medial epicondyle, 2+ pulse distally  Lymphadenopathy:     Cervical: No cervical adenopathy.  Skin:    General: Skin is warm and dry.  Neurological:     Mental Status: He is alert and oriented to person, place, and time.     Comments: 5 out of 5 strength bilateral upper extremities  Psychiatric:        Mood and Affect: Mood normal.     ED Results / Procedures / Treatments   Labs (all labs ordered are listed, but only abnormal results are displayed) Labs Reviewed  CBC WITH DIFFERENTIAL/PLATELET  BASIC METABOLIC PANEL  URINALYSIS, ROUTINE W REFLEX MICROSCOPIC    EKG None  Radiology No results found.  Procedures Procedures (including critical care time)  Medications  Ordered in ED Medications  ketorolac (TORADOL) 15 MG/ML injection 15 mg (has no administration in time range)  sodium chloride 0.9 % bolus 1,000 mL (has no administration in time range)    ED Course  I have reviewed the triage vital signs and the nursing notes.  Pertinent labs & imaging results that were available during my care of the patient were reviewed by me and considered in my medical decision making (see chart for details).    MDM Rules/Calculators/A&P  Patient presents with 2 separate complaints.  He is overall nontoxic and vital signs are reassuring.  He is afebrile.  Regarding his elbow pain, he reports bilateral pain but significant pain on the right greater than left with some paresthesias.  They are in the ulnar distribution with tenderness over the medial elbow.  This is highly suggestive of medial epicondylitis.  This is likely overuse.  No indication at this time for x-rays.  Low suspicion for septic joint or arthritis.  Additionally he reports back pain.  He is concerned about his kidneys.  He reports history of stones but no documented stones in his chart.  He has some reproducible tenderness on exam.  Considerations include but not limited to, renal pathology, UTI, lumbosacral strain.  Labs obtained.  Patient was given Toradol and fluids.  Patient signed out to oncoming provider. Final Clinical Impression(s) / ED Diagnoses Final diagnoses:  Medial epicondylitis of elbow, right  Acute left-sided low back pain without sciatica    Rx / DC Orders ED Discharge Orders    None       Merryl Hacker, MD 12/09/19 [id number]

## 2019-12-09 NOTE — ED Notes (Signed)
Review D/C papers with pt, pt states understanding, pt denies questions at this time.

## 2019-12-18 NOTE — Progress Notes (Signed)
    SUBJECTIVE:   CHIEF COMPLAINT / HPI:   Left arm pain: comes and goes.  Started two weeks ago.  Occurs several times a day and lasts a few minutes.  Sharp to 'burning' type of pain.  Not worse with muscle movement.  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  Tried ice and heat and this didn't help.    Left knee pain: located in the popliteal area.  It has been ongoing for two weeks.  Doesn't bother him that much. Just 'notices' the pain throughout the day.  Doesn't radiate. No swelling in the leg. No calf pain. Has not been on any long distance car rides or airplanes.  Doesn't interfere with ambulation.    PERTINENT  PMH / PSH: none  OBJECTIVE:   BP 122/84   Pulse 94   Ht 5\' 9"  (1.753 m)   Wt 293 lb 9.6 oz (133.2 kg)   SpO2 97%   BMI 43.36 kg/m   gen: Alert and oriented.  No acute distress CV: Regular rate and rhythm Pulmonary: Lungs good auscultation bilaterally Musculoskeletal: Tenderness to palpation in the left forearm near the antecubital fossa.  Mild pain with supination. Lower extremity: No asymmetry in the legs.  No calf tenderness.  No swelling in the popliteal area of the left leg.  Mild tenderness to palpation in the popliteal fossa on the left side  ASSESSMENT/PLAN:   Synovial cyst of left popliteal space Unlikely to be LE DVT given lack of risk factors and normal physical exam.  Likely a small Baker's cyst given the location of his tenderness.  Advised patient this will likely go away on its own and the anti-inflammatory medication may help as well.  Advised patient to return to clinic if swelling or pain worsened.  Biceps tendinitis of left upper extremity Patient lifts heavy boxes all day at work and also lifts weights in his free time including bicep curls.  Physical exam consistent with distal biceps tendinitis.  Given meloxicam, advised to stretch and limit his heavy lifting and apply heat and/or ice to the area as needed.      Benay Pike, MD [place]

## 2019-12-19 ENCOUNTER — Encounter: Payer: Self-pay | Admitting: Family Medicine

## 2019-12-19 ENCOUNTER — Ambulatory Visit (INDEPENDENT_AMBULATORY_CARE_PROVIDER_SITE_OTHER): Payer: Self-pay | Admitting: Family Medicine

## 2019-12-19 ENCOUNTER — Other Ambulatory Visit: Payer: Self-pay

## 2019-12-19 VITALS — BP 122/84 | HR 94 | Ht 69.0 in | Wt 293.6 lb

## 2019-12-19 DIAGNOSIS — M7522 Bicipital tendinitis, left shoulder: Secondary | ICD-10-CM

## 2019-12-19 DIAGNOSIS — M7122 Synovial cyst of popliteal space [Baker], left knee: Secondary | ICD-10-CM

## 2019-12-19 MED ORDER — MELOXICAM 15 MG PO TABS: 15.0000 mg | ORAL_TABLET | Freq: Every day | ORAL | 0 refills | Status: DC

## 2019-12-19 NOTE — Patient Instructions (Signed)
It was nice to meet you today,   I have prescribed you two weeks of an antiinflammatory medication called meloxicam.  Take it once a day.  Also, you should avoid heavy lifting using that arm during the two weeks.  You can use ice and heat as well.    The pain behind your knee is likely a baker's cyst.  I have included some information on it for you to read.  It should eventually go away on it's own.  The antiinflammatory medication should also help with your leg.    Have a great day,   Clemetine Marker, MD    Distal Biceps Tendinitis  Distal biceps tendinitis is inflammation of the distal biceps tendon. This tendon is a strong cord of tissue that connects the biceps muscle on the front of the upper arm to the radius bone in the elbow. Distal biceps tendinitis can interfere with the ability to bend the elbow and to turn the hand palm up (supination). Distal biceps tendinitis may include a grade 1 or grade 2 strain of the tendon.  A grade 1 strain is mild. There is a slight pull of the tendon without any stretching or tearing of the tendon. There is also usually no loss of biceps muscle strength.  A grade 2 strain is moderate. There is a small tear in the tendon. The tendon is stretched, and biceps muscle strength is usually decreased. This condition is most often caused by overusing the elbow joint and the biceps muscle. It usually heals within 6 weeks. What are the causes? This condition may be caused by:  A sudden increase in the frequency or intensity of activity that involves the elbow and the biceps muscle.  Overuse of the biceps muscle. This can happen when you do the same movements over and over, such as: ? Turning your hand palm up. ? Forceful straightening (hyperextension) of the elbow. ? Bending of the elbow.  A direct, forceful hit or injury (trauma) to the elbow. This is rare. What increases the risk? The following factors may make you more likely to develop this  condition:  Playing contact sports.  Playing sports that involve throwing and overhead movements, including racket sports, gymnastics, weight lifting, or bodybuilding.  Doing physical labor.  Having poor strength and flexibility of the arm and shoulder.  Having injured other parts of the elbow. What are the signs or symptoms? Symptoms of this condition may include:  Pain and inflammation in the front of the elbow. Pain may get worse during certain movements, such as: ? Turning your hand palm up. ? Bending your elbow. ? Lifting or carrying objects. ? Throwing.  A feeling of warmth in the front of your elbow.  A feeling of weakness.  A crackling sound (crepitation) when you move or touch the elbow or the upper arm. In some cases, symptoms may return (recur) after treatment, and they may be long-lasting (chronic). How is this diagnosed? This condition is diagnosed based on:  Your symptoms.  Your medical history.  A physical exam. Your health care provider may have you do arm movements to test your range of motion.  You may have other tests, including: ? X-rays. ? MRI. How is this treated? Treatment for this condition depends on the severity of the injury. You may be treated by:  Resting the injured arm. Avoid flexing the elbow.  Applying ice or heat to the injured area.  Taking medicines to relieve pain and inflammation.  Ultrasound therapy. This  applies sound waves to the injured area.  Physical therapy. Follow these instructions at home: Managing pain, stiffness, and swelling      If directed, put ice on the injured area. ? Put ice in a plastic bag. ? Place a towel between your skin and the bag. ? Leave the ice on for 20 minutes, 2-3 times a day.  If directed, apply heat to the affected area before you exercise. Use the heat source that your health care provider recommends, such as a moist heat pack or a heating pad. ? Place a towel between your skin and  the heat source. ? Leave the heat on for 20-30 minutes. ? Remove the heat if your skin turns bright red. This is especially important if you are unable to feel pain, heat, or cold. You may have a greater risk of getting burned.  Move your fingers often to reduce stiffness and swelling.  Raise (elevate) the injured area above the level of your heart while you are sitting or lying down. Activity  Do not lift anything that is heavier than 10 lb (4.5 kg), or the limit that you are told, until your health care provider says that it is safe.  Avoid activities that cause pain or make your condition worse.  Return to your normal activities as told by your health care provider. Ask your health care provider what activities are safe for you.  Do exercises as told by your health care provider. General instructions  Take over-the-counter and prescription medicines only as told by your health care provider.  Do not use any products that contain nicotine or tobacco, such as cigarettes, e-cigarettes, and chewing tobacco. These can delay healing. If you need help quitting, ask your health care provider.  Keep all follow-up visits as told by your health care provider. This is important. How is this prevented?  Warm up and stretch before being active.  Cool down and stretch after being active.  Give your body time to rest between periods of activity.  Make sure to use equipment that fits you.  Be safe and responsible while being active. This will help you avoid falls.  Maintain physical fitness, including: ? Strength. ? Flexibility. ? Heart health (cardiovascular fitness). ? The ability to use muscles for a long time (endurance). Contact a health care provider if:  You have symptoms that get worse or do not get better after 2 weeks of treatment.  You develop new symptoms. Get help right away if:  You develop severe pain. Summary  Distal biceps tendinitis is inflammation of the distal  biceps tendon.  This injury is caused by a sudden increase in the frequency or intensity of activity or overuse of the biceps muscle.  Distal biceps tendinitis can interfere with the ability to bend the elbow and to turn the hand palm up.  This condition is treated with rest, ice, heat, physical therapy, and pain medicines if needed.  Follow activity and lifting restrictions as told by your health care provider. This information is not intended to replace advice given to you by your health care provider. Make sure you discuss any questions you have with your health care provider. Document Revised: 05/01/2018 Document Reviewed: 01/02/2018 Elsevier Patient Education  [employer].

## 2019-12-21 DIAGNOSIS — M7122 Synovial cyst of popliteal space [Baker], left knee: Secondary | ICD-10-CM | POA: Insufficient documentation

## 2019-12-21 DIAGNOSIS — M7522 Bicipital tendinitis, left shoulder: Secondary | ICD-10-CM | POA: Insufficient documentation

## 2019-12-21 NOTE — Assessment & Plan Note (Signed)
Patient lifts heavy boxes all day at work and also lifts weights in his free time including bicep curls.  Physical exam consistent with distal biceps tendinitis.  Given meloxicam, advised to stretch and limit his heavy lifting and apply heat and/or ice to the area as needed.

## 2019-12-21 NOTE — Assessment & Plan Note (Signed)
Unlikely to be LE DVT given lack of risk factors and normal physical exam.  Likely a small Baker's cyst given the location of his tenderness.  Advised patient this will likely go away on its own and the anti-inflammatory medication may help as well.  Advised patient to return to clinic if swelling or pain worsened.

## 2019-12-25 ENCOUNTER — Encounter: Payer: Self-pay | Admitting: Family Medicine

## 2019-12-25 ENCOUNTER — Ambulatory Visit (INDEPENDENT_AMBULATORY_CARE_PROVIDER_SITE_OTHER): Payer: Self-pay | Admitting: Family Medicine

## 2019-12-25 ENCOUNTER — Other Ambulatory Visit: Payer: Self-pay

## 2019-12-25 VITALS — BP 136/92 | HR 91 | Ht 69.0 in | Wt 295.6 lb

## 2019-12-25 DIAGNOSIS — R7309 Other abnormal glucose: Secondary | ICD-10-CM

## 2019-12-25 DIAGNOSIS — R7303 Prediabetes: Secondary | ICD-10-CM | POA: Insufficient documentation

## 2019-12-25 DIAGNOSIS — R319 Hematuria, unspecified: Secondary | ICD-10-CM | POA: Insufficient documentation

## 2019-12-25 DIAGNOSIS — E119 Type 2 diabetes mellitus without complications: Secondary | ICD-10-CM | POA: Insufficient documentation

## 2019-12-25 LAB — POCT GLYCOSYLATED HEMOGLOBIN (HGB A1C): Hemoglobin A1C: 6.2 % — AB (ref 4.0–5.6)

## 2019-12-25 NOTE — Assessment & Plan Note (Signed)
He has had some mildly elevated glucose levels on some of his labs in the past.  Today A1c is normal.

## 2019-12-25 NOTE — Assessment & Plan Note (Signed)
He needs to be evaluated by urology and I have already placed the referral but he has not yet heard back from them.  I gave him their phone number so he can call them.  If he has any problems getting this set up, he will let me know.

## 2019-12-25 NOTE — Progress Notes (Signed)
    CHIEF COMPLAINT / HPI: Follow-up of some abnormal blood work in the hospital.  Was noted to have slightly elevated blood sugar and to have blood in his urine.  Has not seen any blood in his urine and has not had any unusual pain with urination, no frequency of urination. 2.  Also wants to discuss weight loss and getting back in shape.   PERTINENT  PMH / PSH: I have reviewed the patient's medications, allergies, past medical and surgical history, smoking status and updated in the EMR as appropriate.   OBJECTIVE:  BP (!) 136/92   Pulse 91   Ht 5\' 9"  (1.753 m)   Wt 295 lb 9.6 oz (134.1 kg)   SpO2 98%   BMI 43.65 kg/m   Vital signs reviewed. GENERAL: Well-developed, well-nourished, no acute distress. CARDIOVASCULAR: Regular rate and rhythm no murmur gallop or rub LUNGS: Clear to auscultation bilaterally, no rales or wheeze. ABDOMEN: Soft positive bowel sounds NEURO: No gross focal neurological deficits. MSK: Movement of extremity x 4.   ASSESSMENT / PLAN: Diet and exercise for weight loss: We discussed this at length today.  I recommended recumbent bicycle for aerobic activity and decrease carbohydrates for his diet.  He is already cut out sodas and some sugary sweets and chips.  I had like to follow him up in 2 months.  Hematuria He needs to be evaluated by urology and I have already placed the referral but he has not yet heard back from them.  I gave him their phone number so he can call them.  If he has any problems getting this set up, he will let me know.  Elevated glucose He has had some mildly elevated glucose levels on some of his labs in the past.  Today A1c is normal.   [name] MD

## 2020-01-13 ENCOUNTER — Ambulatory Visit (HOSPITAL_COMMUNITY)
Admission: EM | Admit: 2020-01-13 | Discharge: 2020-01-13 | Disposition: A | Discharge status: Home / Self Care | Payer: Self-pay

## 2020-01-13 ENCOUNTER — Other Ambulatory Visit: Payer: Self-pay

## 2020-01-14 ENCOUNTER — Ambulatory Visit (INDEPENDENT_AMBULATORY_CARE_PROVIDER_SITE_OTHER): Payer: Self-pay | Admitting: Family Medicine

## 2020-01-14 ENCOUNTER — Other Ambulatory Visit: Payer: Self-pay

## 2020-01-14 VITALS — BP 112/62 | HR 93 | Wt 296.2 lb

## 2020-01-14 DIAGNOSIS — W19XXXA Unspecified fall, initial encounter: Secondary | ICD-10-CM | POA: Insufficient documentation

## 2020-01-14 DIAGNOSIS — M25561 Pain in right knee: Secondary | ICD-10-CM

## 2020-01-14 NOTE — Assessment & Plan Note (Signed)
Patient reportedly slipped and fell down the stairs.  Initially had right hip and knee pain.  Pain has been consistently improving and he wants to return to work but needs a return to work note.  Physical exam was reassuring with no bony tenderness, neurovascularly intact, no obvious injury.  Mild pain with extreme flexion of right knee.  Recommend continued ice as needed, Tylenol for pain as needed, follow-up if needed.  Patient may return to work without restrictions.

## 2020-01-14 NOTE — Patient Instructions (Signed)
It was great seeing you today.  I am sorry he fell and hurt her knee but it appears that it is healing and doing well.  You can return to work with no restrictions.  I hope you have a wonderful afternoon happy new year.

## 2020-01-14 NOTE — Progress Notes (Signed)
° ° °  SUBJECTIVE:   CHIEF COMPLAINT / HPI:   Fall Patient reports that he was walking down the stairs on Sunday and missed a step and fell.  His r leg went behind him and he landed on his right knee.  He was having pain and so called out from work.  He went to work yesterday and they told him that he was not allowed to return unless he had a doctor's note saying he was okay to return to work.  Reports that the pain is improved and is able to ambulate without difficulty.  He has minor pain with extreme flexion at his knee but no tenderness to palpation.  No numbness, tingling, weakness.  No loss of bowel or bladder.  Denies any loss of consciousness with the fall.   OBJECTIVE:   BP 112/62    Pulse 93    Wt 296 lb 3.2 oz (134.4 kg)    SpO2 97%    BMI 43.74 kg/m   General: Well-appearing, no acute distress Respiratory: Normal work of breathing  Knee exam, right: No gross deformity, ecchymoses, swelling. No TTP.  Mild pain with flexion of knee in the posterior aspect of his knee FROM. Negative ant/post drawers. Negative valgus/varus testing. Negative lachmans. Negative mcmurrays, apleys, patellar apprehension. NV intact distally.   ASSESSMENT/PLAN:   Fall Patient reportedly slipped and fell down the stairs.  Initially had right hip and knee pain.  Pain has been consistently improving and he wants to return to work but needs a return to work note.  Physical exam was reassuring with no bony tenderness, neurovascularly intact, no obvious injury.  Mild pain with extreme flexion of right knee.  Recommend continued ice as needed, Tylenol for pain as needed, follow-up if needed.  Patient may return to work without restrictions.     Derrel Nip, MD [facility]

## 2020-01-22 ENCOUNTER — Ambulatory Visit: Payer: Self-pay | Admitting: Family Medicine

## 2020-01-28 ENCOUNTER — Emergency Department (HOSPITAL_BASED_OUTPATIENT_CLINIC_OR_DEPARTMENT_OTHER)
Admission: EM | Admit: 2020-01-28 | Discharge: 2020-01-29 | Disposition: A | Discharge status: Home / Self Care | Payer: Self-pay | Attending: Emergency Medicine | Admitting: Emergency Medicine

## 2020-01-28 ENCOUNTER — Other Ambulatory Visit: Payer: Self-pay

## 2020-01-28 ENCOUNTER — Emergency Department (HOSPITAL_BASED_OUTPATIENT_CLINIC_OR_DEPARTMENT_OTHER): Payer: Self-pay

## 2020-01-28 ENCOUNTER — Encounter (HOSPITAL_BASED_OUTPATIENT_CLINIC_OR_DEPARTMENT_OTHER): Payer: Self-pay

## 2020-01-28 DIAGNOSIS — R110 Nausea: Secondary | ICD-10-CM | POA: Insufficient documentation

## 2020-01-28 DIAGNOSIS — R1012 Left upper quadrant pain: Secondary | ICD-10-CM | POA: Insufficient documentation

## 2020-01-28 DIAGNOSIS — Z20822 Contact with and (suspected) exposure to covid-19: Secondary | ICD-10-CM | POA: Insufficient documentation

## 2020-01-28 DIAGNOSIS — Z8603 Personal history of neoplasm of uncertain behavior: Secondary | ICD-10-CM | POA: Insufficient documentation

## 2020-01-28 DIAGNOSIS — Z8616 Personal history of COVID-19: Secondary | ICD-10-CM | POA: Insufficient documentation

## 2020-01-28 DIAGNOSIS — R1013 Epigastric pain: Secondary | ICD-10-CM | POA: Insufficient documentation

## 2020-01-28 DIAGNOSIS — R0981 Nasal congestion: Secondary | ICD-10-CM | POA: Insufficient documentation

## 2020-01-28 LAB — URINALYSIS, ROUTINE W REFLEX MICROSCOPIC
Bilirubin Urine: NEGATIVE
Glucose, UA: NEGATIVE mg/dL
Ketones, ur: NEGATIVE mg/dL
Leukocytes,Ua: NEGATIVE
Nitrite: NEGATIVE
Protein, ur: NEGATIVE mg/dL
Specific Gravity, Urine: 1.03 (ref 1.005–1.030)
pH: 6 (ref 5.0–8.0)

## 2020-01-28 LAB — COMPREHENSIVE METABOLIC PANEL
ALT: 41 U/L (ref 0–44)
AST: 34 U/L (ref 15–41)
Albumin: 4.1 g/dL (ref 3.5–5.0)
Alkaline Phosphatase: 62 U/L (ref 38–126)
Anion gap: 7 (ref 5–15)
BUN: 13 mg/dL (ref 6–20)
CO2: 28 mmol/L (ref 22–32)
Calcium: 9.1 mg/dL (ref 8.9–10.3)
Chloride: 103 mmol/L (ref 98–111)
Creatinine, Ser: 1.17 mg/dL (ref 0.61–1.24)
GFR, Estimated: >60 mL/min (ref >60)
Glucose, Bld: 106 mg/dL — ABNORMAL HIGH (ref 70–99)
Potassium: 3.9 mmol/L (ref 3.5–5.1)
Sodium: 138 mmol/L (ref 135–145)
Total Bilirubin: 0.5 mg/dL (ref 0.3–1.2)
Total Protein: 7.9 g/dL (ref 6.5–8.1)

## 2020-01-28 LAB — LIPASE, BLOOD: Lipase: 34 U/L (ref 11–51)

## 2020-01-28 LAB — CBC
HCT: 45.5 % (ref 39.0–52.0)
Hemoglobin: 15.1 g/dL (ref 13.0–17.0)
MCH: 29.3 pg (ref 26.0–34.0)
MCHC: 33.2 g/dL (ref 30.0–36.0)
MCV: 88.2 fL (ref 80.0–100.0)
Platelets: 268 10*3/uL (ref 150–400)
RBC: 5.16 MIL/uL (ref 4.22–5.81)
RDW: 13.3 % (ref 11.5–15.5)
WBC: 7.2 10*3/uL (ref 4.0–10.5)
nRBC: 0 % (ref 0.0–0.2)

## 2020-01-28 LAB — URINALYSIS, MICROSCOPIC (REFLEX): WBC, UA: NONE SEEN WBC/hpf (ref 0–5)

## 2020-01-28 MED ORDER — ALUM & MAG HYDROXIDE-SIMETH 200-200-20 MG/5ML PO SUSP
30.0000 mL | Freq: Once | ORAL | Status: AC
  Administered 2020-01-28: 30 mL via ORAL
  Filled 2020-01-28: qty 30

## 2020-01-28 MED ORDER — LIDOCAINE VISCOUS HCL 2 % MT SOLN
15.0000 mL | Freq: Once | OROMUCOSAL | Status: AC
  Administered 2020-01-28: 15 mL via ORAL
  Filled 2020-01-28: qty 15

## 2020-01-28 MED ORDER — OMEPRAZOLE 20 MG PO CPDR: 20.0000 mg | DELAYED_RELEASE_CAPSULE | Freq: Every day | ORAL | 0 refills | Status: DC

## 2020-01-28 MED ORDER — CYCLOBENZAPRINE HCL 10 MG PO TABS: 10.0000 mg | ORAL_TABLET | Freq: Two times a day (BID) | ORAL | 0 refills | Status: DC | PRN

## 2020-01-28 NOTE — ED Notes (Signed)
EDP at bedside  

## 2020-01-28 NOTE — Discharge Instructions (Signed)
Your work-up today is overall reassuring.  I suspect you have either a muscle spasm causing her discomfort in the left side versus some gastric irritation called gastritis.  Please use the Prilosec to help if it is the gastritis and use the muscle relaxant elevate as the muscles.  Please rest and stay hydrated.  Please follow-up on your COVID test.  If any symptoms change or worsen, please return to the nearest emergency department. Please follow up with a PCP.

## 2020-01-28 NOTE — ED Triage Notes (Signed)
Upper left sided abdominal pain, ongoing problem, worse last few days.  Patient has been seen for it before and nothing was ever diagnosed.  Congestion, sore throat.

## 2020-01-28 NOTE — ED Provider Notes (Signed)
[facility] EMERGENCY DEPARTMENT Provider Note   CSN: [id number] Arrival date & time: 01/28/20  1440     History Chief Complaint  Patient presents with  . Abdominal Pain    Derrick Scott is a 45 y.o. male.  The history is provided by the patient and medical records. No language interpreter was used.  Abdominal Pain Pain location:  LUQ and epigastric Pain quality: aching   Pain radiates to:  L flank Pain severity:  Moderate Onset quality:  Gradual Duration:  3 days Timing:  Constant Progression:  Waxing and waning Chronicity:  Recurrent Relieved by:  Nothing Worsened by:  Nothing Associated symptoms: hematuria and nausea   Associated symptoms: no chest pain, no chills, no constipation, no cough, no diarrhea, no dysuria, no fatigue, no fever and no shortness of breath   Risk factors: obesity        Past Medical History:  Diagnosis Date  . Medial meniscus tear 01/2012   right knee  . Obesity   . Pneumonia   . Renal disorder   . Vertigo, benign paroxysmal 2015    Patient Active Problem List   Diagnosis Date Noted  . Fall 01/14/2020  . Hematuria 12/25/2019  . Elevated glucose 12/25/2019  . Synovial cyst of left popliteal space 12/21/2019  . Biceps tendinitis of left upper extremity 12/21/2019  . Neck pain 10/24/2019  . Chest pain 09/06/2019  . Screening cholesterol level 09/06/2019  . Diabetes mellitus screening 09/06/2019  . Internal hemorrhoids without complication [id number]  . COVID-19 virus infection 12/11/2018  . Urethritis 03/06/2018  . 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 Other tear of medial meniscus, current injury, left knee, subsequent encounter 05/04/2017  . Pes planus 02/07/2017  . Laryngopharyngeal reflux (LPR) 07/24/2015  . Post corneal transplant 07/25/2014  . 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 Benign paroxysmal positional vertigo 08/30/2013  . Adrenal mass, right  ([place]) 05/22/2013  . Sleep apnea 07/31/2009  . OBESITY, NOS 03/16/2006    Past Surgical History:  Procedure Laterality Date  . CORNEAL TRANSPLANT  12/2009  . KNEE ARTHROSCOPY  02/20/2012   Procedure: ARTHROSCOPY KNEE;  Surgeon: Alta Corning, MD;  Location: [facility];  Service: Orthopedics;  Laterality: Right;  Chondroplasia patella femoral joint, medial meniscal debriedment       Family History  Problem Relation Age of Onset  . Asthma Mother   . Diabetes Father   . Cancer Father        prostate  . Osteoarthritis Sister   . Diabetes Sister     Social History   Tobacco Use  . Smoking status: Never Smoker  . Smokeless tobacco: Never Used  Vaping Use  . Vaping Use: Never used  Substance Use Topics  . Alcohol use: No  . Drug use: No    Home Medications Prior to Admission medications   Medication Sig Start Date End Date Taking? Authorizing Provider  albuterol (VENTOLIN HFA) 108 (90 Base) MCG/ACT inhaler Inhale 1-2 puffs into the lungs every 6 (six) hours as needed for wheezing or shortness of breath. 10/23/19   Dickie La, MD  meloxicam (MOBIC) 15 MG tablet Take 1 tablet (15 mg total) by mouth daily. 12/19/19   Benay Pike, MD  methocarbamol (ROBAXIN) 500 MG tablet Take 1 tablet (500 mg total) by mouth every 8 (eight) hours as needed for muscle spasms. 12/09/19   Davonna Belling, MD  omeprazole ([place]) 20  MG capsule Take 1 capsule (20 mg total) by mouth daily. 09/16/19   Dickie La, MD  predniSONE (DELTASONE) 20 MG tablet Take 2 tablets (40 mg total) by mouth daily. 12/09/19   Davonna Belling, MD    Allergies    Shrimp [shellfish allergy]  Review of Systems   Review of Systems  Constitutional: Negative for chills, diaphoresis, fatigue and fever.  HENT: Negative for congestion.   Respiratory: Negative for cough, chest tightness, shortness of breath and wheezing.   Cardiovascular: Negative for chest pain, palpitations and leg swelling.   Gastrointestinal: Positive for abdominal pain and nausea. Negative for constipation and diarrhea.  Genitourinary: Positive for flank pain and hematuria. Negative for decreased urine volume, dysuria and frequency.  Musculoskeletal: Positive for back pain. Negative for neck pain and neck stiffness.  Neurological: Negative for light-headedness and headaches.  Psychiatric/Behavioral: Negative for agitation and confusion.  All other systems reviewed and are negative.   Physical Exam Updated Vital Signs BP (!) 144/94   Pulse 85   Temp 98.4 F (36.9 C) (Oral)   Resp 19   Ht 5\' 8"  (1.727 m)   Wt 131.1 kg   SpO2 98%   BMI 43.94 kg/m   Physical Exam Vitals and nursing note reviewed.  Constitutional:      General: He is not in acute distress.    Appearance: He is well-developed and well-nourished. He is not ill-appearing, toxic-appearing or diaphoretic.  HENT:     Head: Normocephalic and atraumatic.  Eyes:     Conjunctiva/sclera: Conjunctivae normal.  Cardiovascular:     Rate and Rhythm: Normal rate and regular rhythm.     Heart sounds: No murmur heard.   Pulmonary:     Effort: Pulmonary effort is normal. No respiratory distress.     Breath sounds: Normal breath sounds. No wheezing, rhonchi or rales.  Chest:     Chest wall: No tenderness.  Abdominal:     General: Abdomen is flat. Bowel sounds are normal. There is no distension.     Palpations: Abdomen is soft.     Tenderness: There is abdominal tenderness in the left upper quadrant. There is left CVA tenderness. There is no right CVA tenderness.  Musculoskeletal:        General: No edema.     Cervical back: Neck supple.  Skin:    General: Skin is warm and dry.     Capillary Refill: Capillary refill takes less than 2 seconds.  Neurological:     General: No focal deficit present.     Mental Status: He is alert.  Psychiatric:        Mood and Affect: Mood and affect and mood normal.     ED Results / Procedures /  Treatments   Labs (all labs ordered are listed, but only abnormal results are displayed) Labs Reviewed  COMPREHENSIVE METABOLIC PANEL - Abnormal; Notable for the following components:      Result Value   Glucose, Bld 106 (*)    All other components within normal limits  URINALYSIS, ROUTINE W REFLEX MICROSCOPIC - Abnormal; Notable for the following components:   Hgb urine dipstick SMALL (*)    All other components within normal limits  URINALYSIS, MICROSCOPIC (REFLEX) - Abnormal; Notable for the following components:   Bacteria, UA RARE (*)    All other components within normal limits  URINE CULTURE  SARS CORONAVIRUS 2 (TAT 6-24 HRS)  LIPASE, BLOOD  CBC    EKG EKG Interpretation  Date/Time:  Tuesday January 28 2020 14:45:45 EST Ventricular Rate:  101 PR Interval:  164 QRS Duration: 92 QT Interval:  336 QTC Calculation: 435 R Axis:   -76 Text Interpretation: Sinus tachycardia Left anterior fascicular block Abnormal ECG No significant change since last tracing Confirmed by Calvert Cantor [phone]) on 01/28/2020 2:55:51 PM   Radiology CT Renal Stone Study  Result Date: 01/28/2020 CLINICAL DATA:  Worsening chronic left-sided abdominal pain. Flank pain, kidney stone suspected. EXAM: CT ABDOMEN AND PELVIS WITHOUT CONTRAST TECHNIQUE: Multidetector CT imaging of the abdomen and pelvis was performed following the standard protocol without IV contrast. COMPARISON:  CT abdomen pelvis March 03, 2018. FINDINGS: Lower chest: Bibasilar atelectasis. Hepatobiliary: Diffuse hepatic steatosis. No suspicious hepatic lesions on this noncontrast exam. Pharyngeal cap of the gallbladder which is otherwise unremarkable. No biliary ductal dilatation. Pancreas: Unremarkable. No pancreatic ductal dilatation or surrounding inflammatory changes. Spleen: Normal in size without focal abnormality. Adrenals/Urinary Tract: Intermediate density right adrenal nodule which is stable in size in comparison to most  recent prior measuring 2.2 cm (series 2, image 23) but has been slowly increasing over multiple years for instance with measured 1.4 cm on May 29, 2013, 1.2 cm on September 29, 2012 and was not present on May 04, 2009. Favor a benign lipid poor adenoma. Kidneys are normal, without renal calculi focal lesion or hydronephrosis. The bladder is predominantly decompressed but otherwise appears normal. Stomach/Bowel: Stomach is within normal limits. Appendix appears normal. No evidence of bowel wall thickening, distention, or inflammatory changes. Vascular/Lymphatic: No significant vascular findings are present. No enlarged abdominal or pelvic lymph nodes. Reproductive: Prostate is unremarkable. Other: No abdominal wall hernia or abnormality. No abdominopelvic ascites. Musculoskeletal: No acute osseous abnormality. No suspicious lytic or blastic lesion of bone. IMPRESSION: 1. No acute intra-abdominal findings to explain the patient's LEFT-sided abdominal pain. Specifically, no evidence of nephrolithiasis or obstructive uropathy. 2. Diffuse hepatic steatosis, which can be a cause of abdominal pain. 3. Intermediate density RIGHT adrenal nodule which is stable in size in comparison to most recent prior but has been slowly increasing in size over multiple more remote priors. Favored to represent a benign lipid poor adenoma. However, recommend endocrinology consult if not previously performed. Electronically Signed   By: Dahlia Bailiff MD   On: 01/28/2020 22:16    Procedures Procedures (including critical care time)  Medications Ordered in ED Medications  alum & mag hydroxide-simeth (MAALOX/MYLANTA) 200-200-20 MG/5ML suspension 30 mL (30 mLs Oral Given 01/28/20 2148)    And  lidocaine (XYLOCAINE) 2 % viscous mouth solution 15 mL (15 mLs Oral Given 01/28/20 2148)    ED Course  I have reviewed the triage vital signs and the nursing notes.  Pertinent labs & imaging results that were available during my care of the  patient were reviewed by me and considered in my medical decision making (see chart for details).    MDM Rules/Calculators/A&P                          Issac Moure Scott is a 45 y.o. male with a past medical history significant for known right adrenal mass, BPPV, hypercholesterolemia, obesity, and sleep apnea who presents with several days of nausea, left upper quadrant abdominal pain, fatigue, malaise, and congestion.  Patient reports symptoms have been on her last few days and he is concerned.  He reports the pain is moderate.  He reports his urine has been darker but no dysuria.  He reports  he is not having significant chest pain or shortness of breath and reports no significant cough.  He does not know of any COVID exposures.  Reports some nausea but no vomiting.  Denies constipation or diarrhea.  Denies history of bowel obstruction or kidney stones.  On exam, lungs are clear and chest is nontender.  Left upper quadrant is tender to palpation into his left flank and left CVA area.  Denies any groin or testicle problems.  Bowel sounds were appreciated.  Exam otherwise unremarkable.  Patient had a CT stone study after his urinalysis shows some hemoglobin.  No evidence of stone seen on CT.  Patient is known right adrenal nodule is similar to prior.  Slight increase which I informed the patient of to follow-up on.  Otherwise no acute findings on CT imaging.  Labs all reassuring.  Patient will get swabbed for COVID-19 and will follow-up with PCP.  Patient was given prescription for muscle relaxant as expected more musculoskeletal and also give prescription for Prilosec given his reflux and the burning discomfort in his left upper quadrant.  Patient encouraged to maintain hydration and follow-up.  He had no other questions or concerns and was discharged in good condition after reassuring work-up.   Final Clinical Impression(s) / ED Diagnoses Final diagnoses:  Left upper quadrant abdominal pain   Nausea  Nose congestion    Rx / DC Orders ED Discharge Orders         Ordered    cyclobenzaprine (FLEXERIL) 10 MG tablet  2 times daily PRN        01/28/20 2348    omeprazole (PRILOSEC) 20 MG capsule  Daily        01/28/20 2348         Clinical Impression: 1. Left upper quadrant abdominal pain   2. Nausea   3. Nose congestion     Disposition: Discharge  Condition: Good  I have discussed the results, Dx and Tx plan with the pt(& family if present). He/she/they expressed understanding and agree(s) with the plan. Discharge instructions discussed at great length. Strict return precautions discussed and pt &/or family have verbalized understanding of the instructions. No further questions at time of discharge.    New Prescriptions   CYCLOBENZAPRINE (FLEXERIL) 10 MG TABLET    Take 1 tablet (10 mg total) by mouth 2 (two) times daily as needed for muscle spasms.   OMEPRAZOLE (PRILOSEC) 20 MG CAPSULE    Take 1 capsule (20 mg total) by mouth daily.    Follow Up: Dickie La, MD [address] [address] [facility] EM[facility] DEPART[facility] [address] [id number] [place] [phone]       Malissa Slay, Gwenyth Allegra, MD 01/28/20 [phone]

## 2020-01-29 LAB — SARS CORONAVIRUS 2 (TAT 6-24 HRS): SARS Coronavirus 2: NEGATIVE

## 2020-01-30 LAB — URINE CULTURE: Culture: NO GROWTH

## 2020-04-08 ENCOUNTER — Ambulatory Visit: Payer: Self-pay | Admitting: Family Medicine

## 2020-04-29 ENCOUNTER — Encounter: Payer: Self-pay | Admitting: Family Medicine

## 2020-04-29 ENCOUNTER — Other Ambulatory Visit: Payer: Self-pay

## 2020-04-29 ENCOUNTER — Ambulatory Visit (INDEPENDENT_AMBULATORY_CARE_PROVIDER_SITE_OTHER): Payer: Self-pay | Admitting: Family Medicine

## 2020-04-29 VITALS — BP 124/78 | HR 72 | Ht 69.0 in | Wt 290.8 lb

## 2020-04-29 DIAGNOSIS — M25551 Pain in right hip: Secondary | ICD-10-CM | POA: Insufficient documentation

## 2020-04-29 DIAGNOSIS — M25559 Pain in unspecified hip: Secondary | ICD-10-CM

## 2020-04-29 DIAGNOSIS — R1031 Right lower quadrant pain: Secondary | ICD-10-CM | POA: Insufficient documentation

## 2020-04-29 DIAGNOSIS — G4730 Sleep apnea, unspecified: Secondary | ICD-10-CM

## 2020-04-29 DIAGNOSIS — R7309 Other abnormal glucose: Secondary | ICD-10-CM

## 2020-04-29 DIAGNOSIS — K921 Melena: Secondary | ICD-10-CM

## 2020-04-29 DIAGNOSIS — H8110 Benign paroxysmal vertigo, unspecified ear: Secondary | ICD-10-CM

## 2020-04-29 LAB — POCT GLYCOSYLATED HEMOGLOBIN (HGB A1C): Hemoglobin A1C: 6.1 % — AB (ref 4.0–5.6)

## 2020-04-29 MED ORDER — OMEPRAZOLE 20 MG PO CPDR: 20.0000 mg | DELAYED_RELEASE_CAPSULE | Freq: Every day | ORAL | 1 refills | Status: DC

## 2020-04-29 NOTE — Assessment & Plan Note (Signed)
A1c looks good Recheck in 6 m

## 2020-04-29 NOTE — Assessment & Plan Note (Signed)
I really think he  Needs colonoscopy Gave him info on assistance sources He does have hx of hemorrhoids but he is at age for first screening colonoscopy anyway so I would go that route. Could consider cologuard as a less expensive option but that is about $800 cost up front as well If it worsens in volume or frequency or he has new sx (do not think his current vertigo sx realted to [facility]) he will call. No signs of overt anemia---will await further labs until we see what finacial resources he can come up with. Hgb 3 m ago 15.1

## 2020-04-29 NOTE — Progress Notes (Signed)
    CHIEF COMPLAINT / HPI: Here with his wife with sevral concerns. She is generally just worried about his health as he tends to "ignore" stuff. 1. Blood in stool---intermittent. Not sure if it is hemorrhoids or not, but his wife says it can be a "lot" of blood. 2. Left hip pain: catches and sometimes locks--happens with certain movements--painful. Has to stop what he is doing and be still for a minute or two to let it pass. Happening once or twice a month. 3. Nausea daily, especially after eating larger meals. Not associated with diarrhea. 4. Episodic sharp head pains---last seconds. Not associated with any specific activity and no associated symptoms. Occur various places in his head (crown, temp[le etc).occur several times a week 5. Having some nasal congestion (allergies?) 6. Occasional episode of vertigo (dixzziness with some room spinning sensation) when he lies down initially.   PERTINENT  PMH / PSH: I have reviewed the patient's medications, allergies, past medical and surgical history, smoking status and updated in the EMR as appropriate. Working 3rd shift but not full time as their hours are cut; no current insurance  OBJECTIVE:  BP 124/78   Pulse 72   Ht 5\' 9"  (1.753 m)   Wt 290 lb 12.8 oz (131.9 kg)   SpO2 98%   BMI 42.94 kg/m  Vital signs reviewed. GENERAL: Well-developed, well-nourished, no acute distress. CARDIOVASCULAR: Regular rate and rhythm LUNGS: No increased work of breathibng PSYCH: AxOx4. Good eye contact.. No psychomotor retardation or agitation. Appropriate speech fluency and content. Asks and answers questions appropriately. Mood is congruent. NEURO: No gross focal neurological deficits. MSK: Movement of extremity x 4.    ASSESSMENT / PLAN:   Sleep apnea He says he is being diligentwith CPAP  Benign paroxysmal positional vertigo I think his allergies to pollen are exacerbating this. Will treat allergy with OTC claritin or zyrtec.  Elevated  glucose A1c looks good Recheck in 6 m  Hip pain Sx c/w hip labral pathology. He has no insurance and does not want to pursue expensive imaging /possoble surgical intervention at this time We discussed I do not think it is risky to follow it for a while If sx worsen, or frequency increases, may need mR Arthrogram left hip joint   [name] MD

## 2020-04-29 NOTE — Assessment & Plan Note (Signed)
Sx c/w hip labral pathology. He has no insurance and does not want to pursue expensive imaging /possoble surgical intervention at this time We discussed I do not think it is risky to follow it for a while If sx worsen, or frequency increases, may need mR Arthrogram left hip joint

## 2020-04-29 NOTE — Assessment & Plan Note (Signed)
I think his allergies to pollen are exacerbating this. Will treat allergy with OTC claritin or zyrtec.

## 2020-04-29 NOTE — Patient Instructions (Addendum)
See about applying for the [place]. We need to get you set up for a colonoscopy  Start OTC claritin or zyrtec---take it every day once a day I sent in the omeprazole and take one a day of that---take it abouit an hour before your biggest meal. See me back in 6-8 weeks

## 2020-04-29 NOTE — Assessment & Plan Note (Signed)
>>  ASSESSMENT AND PLAN FOR SLEEP APNEA WRITTEN ON 04/29/2020  2:30 PM BY Ylonda Storr L, MD  He says he is being diligentwith CPAP

## 2020-04-29 NOTE — Assessment & Plan Note (Signed)
He says he is being diligentwith CPAP

## 2020-05-08 ENCOUNTER — Emergency Department (HOSPITAL_BASED_OUTPATIENT_CLINIC_OR_DEPARTMENT_OTHER): Payer: Self-pay

## 2020-05-08 ENCOUNTER — Encounter (HOSPITAL_BASED_OUTPATIENT_CLINIC_OR_DEPARTMENT_OTHER): Payer: Self-pay | Admitting: Emergency Medicine

## 2020-05-08 ENCOUNTER — Other Ambulatory Visit: Payer: Self-pay

## 2020-05-08 ENCOUNTER — Emergency Department (HOSPITAL_BASED_OUTPATIENT_CLINIC_OR_DEPARTMENT_OTHER)
Admission: EM | Admit: 2020-05-08 | Discharge: 2020-05-08 | Disposition: A | Discharge status: Home / Self Care | Payer: Self-pay | Attending: Emergency Medicine | Admitting: Emergency Medicine

## 2020-05-08 DIAGNOSIS — Y990 Civilian activity done for income or pay: Secondary | ICD-10-CM | POA: Insufficient documentation

## 2020-05-08 DIAGNOSIS — S46002A Unspecified injury of muscle(s) and tendon(s) of the rotator cuff of left shoulder, initial encounter: Secondary | ICD-10-CM | POA: Diagnosis not present

## 2020-05-08 DIAGNOSIS — X500XXA Overexertion from strenuous movement or load, initial encounter: Secondary | ICD-10-CM | POA: Insufficient documentation

## 2020-05-08 MED ORDER — KETOROLAC TROMETHAMINE 60 MG/2ML IM SOLN
60.0000 mg | Freq: Once | INTRAMUSCULAR | Status: AC
  Administered 2020-05-08: 60 mg via INTRAMUSCULAR
  Filled 2020-05-08: qty 2

## 2020-05-08 MED ORDER — IBUPROFEN 800 MG PO TABS: 800.0000 mg | ORAL_TABLET | Freq: Three times a day (TID) | ORAL | 0 refills | Status: DC

## 2020-05-08 MED ORDER — METHOCARBAMOL 500 MG PO TABS: 500.0000 mg | ORAL_TABLET | Freq: Three times a day (TID) | ORAL | 0 refills | Status: DC | PRN

## 2020-05-08 NOTE — ED Provider Notes (Signed)
[facility] EMERGENCY DEPARTMENT Provider Note   CSN: [id number] Arrival date & time: 05/08/20  0424     History Chief Complaint  Patient presents with  . Shoulder Injury    Derrick Scott is a 45 y.o. male.  States that he tweaked his shoulder from lifting weights a few days ago.  He has recently gotten back into weightlifting and he was doing butterfly exercise and started have some anterior shoulder pain related to the same.  Patient states he is at work tonight and it hurts whenever he moves his left arm above 90 degrees.  No other associated symptoms.   Shoulder Injury       Past Medical History:  Diagnosis Date  . Medial meniscus tear 01/2012   right knee  . Obesity   . Pneumonia   . Renal disorder   . Vertigo, benign paroxysmal 2015    Patient Active Problem List   Diagnosis Date Noted  . Hip pain 04/29/2020  . Blood in stool 04/29/2020  . Elevated glucose 12/25/2019  . Internal hemorrhoids without complication 68/34/1962  . Other tear of medial meniscus, current injury, left knee, subsequent encounter 05/04/2017  . Pes planus 02/07/2017  . Laryngopharyngeal reflux (LPR) 07/24/2015  . Post corneal transplant 07/25/2014  . Benign paroxysmal positional vertigo 08/30/2013  . Adrenal mass, right ([place]) 05/22/2013  . Sleep apnea 07/31/2009  . OBESITY, NOS 03/16/2006    Past Surgical History:  Procedure Laterality Date  . CORNEAL TRANSPLANT  12/2009  . KNEE ARTHROSCOPY  02/20/2012   Procedure: ARTHROSCOPY KNEE;  Surgeon: Alta Corning, MD;  Location: [facility];  Service: Orthopedics;  Laterality: Right;  Chondroplasia patella femoral joint, medial meniscal debriedment       Family History  Problem Relation Age of Onset  . Asthma Mother   . Diabetes Father   . Cancer Father        prostate  . Osteoarthritis Sister   . Diabetes Sister     Social History   Tobacco Use  . Smoking status: Never Smoker  . Smokeless  tobacco: Never Used  Vaping Use  . Vaping Use: Never used  Substance Use Topics  . Alcohol use: No  . Drug use: No    Home Medications Prior to Admission medications   Medication Sig Start Date End Date Taking? Authorizing Provider  ibuprofen (ADVIL) 800 MG tablet Take 1 tablet (800 mg total) by mouth 3 (three) times daily. 05/08/20  Yes Tenley Winward, Corene Cornea, MD  methocarbamol (ROBAXIN) 500 MG tablet Take 1 tablet (500 mg total) by mouth every 8 (eight) hours as needed for muscle spasms. 05/08/20  Yes Gilbert Narain, Corene Cornea, MD  albuterol (VENTOLIN HFA) 108 (90 Base) MCG/ACT inhaler Inhale 1-2 puffs into the lungs every 6 (six) hours as needed for wheezing or shortness of breath. 10/23/19   Dickie La, MD  omeprazole (PRILOSEC) 20 MG capsule Take 1 capsule (20 mg total) by mouth daily. 04/29/20   Dickie La, MD    Allergies    Shrimp [shellfish allergy]  Review of Systems   Review of Systems  All other systems reviewed and are negative.   Physical Exam Updated Vital Signs BP (!) 162/98 (BP Location: Right Arm)   Pulse 87   Temp 98.2 F (36.8 C) (Oral)   Resp 16   Ht 5\' 9"  (1.753 m)   Wt 129.3 kg   SpO2 99%   BMI 42.09 kg/m   Physical Exam Vitals and  nursing note reviewed.  Constitutional:      Appearance: He is well-developed.  HENT:     Head: Normocephalic and atraumatic.     Mouth/Throat:     Mouth: Mucous membranes are moist.     Pharynx: Oropharynx is clear.  Eyes:     Pupils: Pupils are equal, round, and reactive to light.  Cardiovascular:     Rate and Rhythm: Normal rate.  Pulmonary:     Effort: Pulmonary effort is normal. No respiratory distress.  Abdominal:     General: There is no distension.  Musculoskeletal:        General: Tenderness (Anterior left shoulder.  Worse with range of motion.  Worse with abduction of the left arm.  Strength is intact.) present. Normal range of motion.     Cervical back: Normal range of motion.  Skin:    General: Skin is warm and dry.   Neurological:     General: No focal deficit present.     Mental Status: He is alert.     ED Results / Procedures / Treatments   Labs (all labs ordered are listed, but only abnormal results are displayed) Labs Reviewed - No data to display  EKG None  Radiology DG Shoulder Left  Result Date: 05/08/2020 CLINICAL DATA:  Left shoulder injury when working out with pain. EXAM: LEFT SHOULDER - 2+ VIEW COMPARISON:  None. FINDINGS: No acute fracture or subluxation. Mild degenerative spurring at the acromioclavicular and glenohumeral joints. IMPRESSION: Mild degenerative spurring.  No acute finding. Electronically Signed   By: Monte Fantasia M.D.   On: 05/08/2020 05:29    Procedures Procedures   Medications Ordered in ED Medications  ketorolac (TORADOL) injection 60 mg (60 mg Intramuscular Given 05/08/20 0505)    ED Course  I have reviewed the triage vital signs and the nursing notes.  Pertinent labs & imaging results that were available during my care of the patient were reviewed by me and considered in my medical decision making (see chart for details).    MDM Rules/Calculators/A&P                          Suspect rotator cuff injury. Xray negative. Doubt ACS or other cardiac abnormalities. Plan for sling, symptomatic/supportive care and sports medicine followup.   Final Clinical Impression(s) / ED Diagnoses Final diagnoses:  Injury of left rotator cuff, initial encounter    Rx / DC Orders ED Discharge Orders         Ordered    methocarbamol (ROBAXIN) 500 MG tablet  Every 8 hours PRN        05/08/20 0543    ibuprofen (ADVIL) 800 MG tablet  3 times daily        05/08/20 0543           Jesse Nosbisch, Corene Cornea, MD 05/08/20 [phone]

## 2020-05-08 NOTE — ED Triage Notes (Signed)
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

## 2020-08-01 IMAGING — DX DG CHEST 1V PORT
1 series · 1 of 1 positions shown · non-contrast
Comparison: 08/20/2018

CLINICAL DATA: Cough for 1 week, history of [id number] exposure but
negative testing, initial encounter

EXAM:
PORTABLE CHEST 1 VIEW

[chest ap]
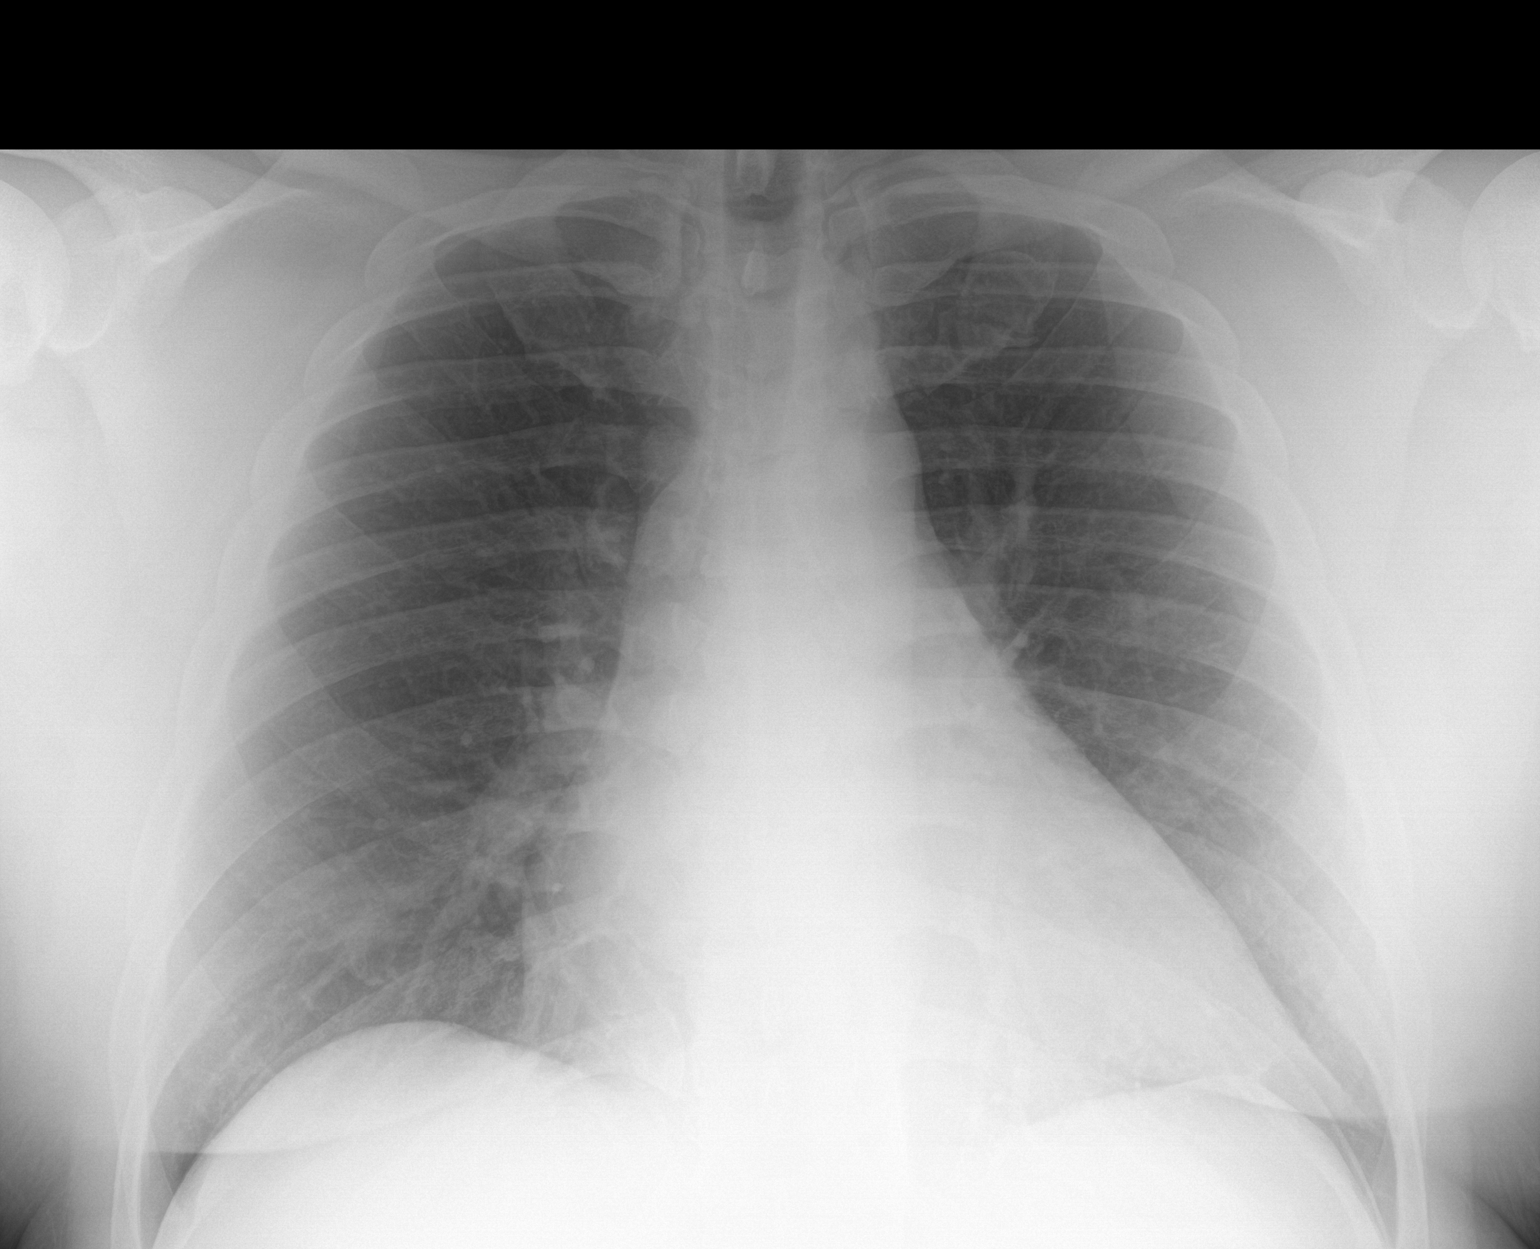

[1 of 1 positions shown; findings below may reference images not displayed]

FINDINGS: Cardiac shadow is enlarged but accentuated by the portable
technique. The lungs are well aerated bilaterally. No focal
infiltrate or sizable effusion is seen. No bony abnormality is
noted.
IMPRESSION: No active disease.

## 2020-08-03 ENCOUNTER — Encounter (HOSPITAL_BASED_OUTPATIENT_CLINIC_OR_DEPARTMENT_OTHER): Payer: Self-pay

## 2020-08-03 ENCOUNTER — Emergency Department (HOSPITAL_BASED_OUTPATIENT_CLINIC_OR_DEPARTMENT_OTHER)
Admission: EM | Admit: 2020-08-03 | Discharge: 2020-08-03 | Disposition: A | Discharge status: Home / Self Care | Payer: Self-pay | Attending: Emergency Medicine | Admitting: Emergency Medicine

## 2020-08-03 ENCOUNTER — Other Ambulatory Visit: Payer: Self-pay

## 2020-08-03 DIAGNOSIS — G43909 Migraine, unspecified, not intractable, without status migrainosus: Secondary | ICD-10-CM | POA: Insufficient documentation

## 2020-08-03 MED ORDER — DIPHENHYDRAMINE HCL 50 MG/ML IJ SOLN
25.0000 mg | Freq: Once | INTRAMUSCULAR | Status: AC
  Administered 2020-08-03: 25 mg via INTRAVENOUS
  Filled 2020-08-03: qty 1

## 2020-08-03 MED ORDER — LORAZEPAM 2 MG/ML IJ SOLN: 0.5000 mg | Freq: Once | INTRAMUSCULAR | Status: DC

## 2020-08-03 MED ORDER — PROCHLORPERAZINE EDISYLATE 10 MG/2ML IJ SOLN
10.0000 mg | Freq: Once | INTRAMUSCULAR | Status: AC
  Administered 2020-08-03: 10 mg via INTRAVENOUS
  Filled 2020-08-03: qty 2

## 2020-08-03 MED ORDER — SODIUM CHLORIDE 0.9 % IV BOLUS
1000.0000 mL | Freq: Once | INTRAVENOUS | Status: AC
  Administered 2020-08-03: 1000 mL via INTRAVENOUS

## 2020-08-03 MED ORDER — KETOROLAC TROMETHAMINE 30 MG/ML IJ SOLN
30.0000 mg | Freq: Once | INTRAMUSCULAR | Status: AC
  Administered 2020-08-03: 30 mg via INTRAVENOUS
  Filled 2020-08-03: qty 1

## 2020-08-03 NOTE — ED Provider Notes (Signed)
[facility] EMERGENCY DEPARTMENT Provider Note   CSN: [id number] Arrival date & time: 08/03/20  1604     History Chief Complaint  Patient presents with   Headache    Derrick Scott is a 45 y.o. male who presents for evaluation of headache that has been ongoing for about 4 to 5 days.  He reports initially when it started about 4 to 5 days ago, it was light in nature and states that it progressively worsened.  No preceding trauma, injury, fall.  He states that over the course of 4 to 5 days he has been taking Excedrin Migraine as well as ibuprofen with minimal improvement.  He will have episodes where it gets better and then returns.  He states this feels like a similar migraines but that it is just lasting longer.  He states that it is mostly in the back of his head and will radiate to the front and sometimes behind his eye.  He describes it as a throbbing headache.  He is not on blood thinners.  He has had associated nausea, photophobia.  No vomiting.  No numbness/weakness of his arms or legs.  He has not noted any fever, difficulty walking.  The history is provided by the patient.      Past Medical History:  Diagnosis Date   Medial meniscus tear 01/2012   right knee   Obesity    Pneumonia    Renal disorder    Vertigo, benign paroxysmal 2015    Patient Active Problem List   Diagnosis Date Noted   Hip pain 04/29/2020   Blood in stool 04/29/2020   Elevated glucose 12/25/2019   Internal hemorrhoids without complication 00/86/7619   Other tear of medial meniscus, current injury, left knee, subsequent encounter 05/04/2017   Pes planus 02/07/2017   Laryngopharyngeal reflux (LPR) 07/24/2015   Post corneal transplant 07/25/2014   Benign paroxysmal positional vertigo 08/30/2013   Adrenal mass, right ([place]) 05/22/2013   Sleep apnea 07/31/2009   OBESITY, NOS 03/16/2006    Past Surgical History:  Procedure Laterality Date   CORNEAL TRANSPLANT  12/2009   KNEE  ARTHROSCOPY  02/20/2012   Procedure: ARTHROSCOPY KNEE;  Surgeon: Alta Corning, MD;  Location: [place];  Service: Orthopedics;  Laterality: Right;  Chondroplasia patella femoral joint, medial meniscal debriedment       Family History  Problem Relation Age of Onset   Asthma Mother    Diabetes Father    Cancer Father        prostate   Osteoarthritis Sister    Diabetes Sister     Social History   Tobacco Use   Smoking status: Never   Smokeless tobacco: Never  Vaping Use   Vaping Use: Never used  Substance Use Topics   Alcohol use: No   Drug use: No    Home Medications Prior to Admission medications   Medication Sig Start Date End Date Taking? Authorizing Provider  albuterol (VENTOLIN HFA) 108 (90 Base) MCG/ACT inhaler Inhale 1-2 puffs into the lungs every 6 (six) hours as needed for wheezing or shortness of breath. 10/23/19   Dickie La, MD  ibuprofen (ADVIL) 800 MG tablet Take 1 tablet (800 mg total) by mouth 3 (three) times daily. 05/08/20   Mesner, Corene Cornea, MD  methocarbamol (ROBAXIN) 500 MG tablet Take 1 tablet (500 mg total) by mouth every 8 (eight) hours as needed for muscle spasms. 05/08/20   Mesner, Corene Cornea, MD  omeprazole (PRILOSEC) 20 MG  capsule Take 1 capsule (20 mg total) by mouth daily. 04/29/20   Dickie La, MD    Allergies    Shrimp [shellfish allergy]  Review of Systems   Review of Systems  Constitutional:  Negative for fever.  Eyes:  Positive for photophobia.  Cardiovascular:  Negative for chest pain.  Gastrointestinal:  Positive for nausea. Negative for abdominal pain and vomiting.  Neurological:  Positive for headaches. Negative for weakness and numbness.  All other systems reviewed and are negative.  Physical Exam Updated Vital Signs BP 127/90   Pulse 81   Temp 98.8 F (37.1 C) (Oral)   Resp 20   Ht 5\' 8"  (1.727 m)   Wt 128.8 kg   SpO2 99%   BMI 43.18 kg/m   Physical Exam Vitals and nursing note reviewed.  Constitutional:       Appearance: He is well-developed.  HENT:     Head: Normocephalic and atraumatic.  Eyes:     General: No scleral icterus.       Right eye: No discharge.        Left eye: No discharge.     Conjunctiva/sclera: Conjunctivae normal.     Comments: PERRL. EOMs intact. No nystagmus. No neglect.   Neck:     Comments: Neck is supple and without rigidity. Pulmonary:     Effort: Pulmonary effort is normal.  Skin:    General: Skin is warm and dry.  Neurological:     Mental Status: He is alert.     Comments: Cranial nerves III-XII intact Follows commands, Moves all extremities  5/5 strength to BUE and BLE  Sensation intact throughout all major nerve distributions Normal finger to nose. No pronator drift. No gait abnormalities. No slurred speech. No facial droop.   Psychiatric:        Speech: Speech normal.        Behavior: Behavior normal.    ED Results / Procedures / Treatments   Labs (all labs ordered are listed, but only abnormal results are displayed) Labs Reviewed - No data to display  EKG None  Radiology No results found.  Procedures Procedures   Medications Ordered in ED Medications  LORazepam (ATIVAN) injection 0.5 mg (0 mg Intravenous Hold 08/03/20 1816)  sodium chloride 0.9 % bolus 1,000 mL (0 mLs Intravenous Stopped 08/03/20 1847)  ketorolac (TORADOL) 30 MG/ML injection 30 mg (30 mg Intravenous Given 08/03/20 1741)  prochlorperazine (COMPAZINE) injection 10 mg (10 mg Intravenous Given 08/03/20 1741)  diphenhydrAMINE (BENADRYL) injection 25 mg (25 mg Intravenous Given 08/03/20 1740)    ED Course  I have reviewed the triage vital signs and the nursing notes.  Pertinent labs & imaging results that were available during my care of the patient were reviewed by me and considered in my medical decision making (see chart for details).    MDM Rules/Calculators/A&P                          45 year old male who presents for evaluation of headache x4 to 5 days.  History  of migraines states this feels similar.  He states it is just lasting longer than his normal migraines.  No fevers, numbness/weakness.  Does report associated photophobia, nausea which he reports is common with his migraines.  No preceding trauma, injury, fall.  On initial arrival, he is afebrile, nontoxic-appearing.  Vital signs are stable.  On exam, he has no nuchal rigidity.  Neck is supple.  Reassuring neuro  exam.  History/physical exam not concerning for intracranial hemorrhage, CVA, meningitis.  Suspect this is likely related to his migraines.  Will give migraine cocktail and reassess.  RN inform you that patient is feeling jittery and anxious.  I went and evaluated patient.  He reports his headache was better but he just felt jittery.  I did offer him some Ativan but patient refused.  He feels like he was having some trouble breathing.  His oxygen levels were maintaining at 98-99% with no evidence of respiratory distress.  Reevaluation.  Patient resting comfortably.  He reports headache has resolved after migraine cocktail and he feels better.  He states that he feels like because he is drowsy from the Benadryl, and he does not have his CPAP machine, he feels like that is causing him to be anxious about having trouble breathing.  He reports that his headache is improved and he has been able to tolerate p.o. in the department that he difficulty.  He is ambulating without any signs of distress.  Patient states he is ready to go home.  Will give outpatient neuro follow-up. At this time, patient exhibits no emergent life-threatening condition that require further evaluation in ED. Patient had ample opportunity for questions and discussion. All patient's questions were answered with full understanding. Strict return precautions discussed. Patient expresses understanding and agreement to plan.    Portions of this note were generated with [employer]. Dictation errors may occur despite best  attempts at proofreading.   Final Clinical Impression(s) / ED Diagnoses Final diagnoses:  Migraine without status migrainosus, not intractable, unspecified migraine type    Rx / DC Orders ED Discharge Orders          Ordered    Ambulatory referral to Neurology       Comments: An appointment is requested in approximately: 8 weeks   08/03/20 1856             Desma Greenstreet 08/03/20 1901    Lajean Saver, MD 08/05/20 1012

## 2020-08-03 NOTE — Discharge Instructions (Addendum)
Follow-up with referred neurologist.  Return to emergency department for any worsening pain, vomiting, difficulty walking, difficulty moving arms or legs or any other worsening concerning symptoms.

## 2020-08-03 NOTE — ED Notes (Signed)
Patient reports feeling "jittery, anxious", encouraged patient to breathe slowly, provider notified.  Patient offered Ativan but refused stating he did not want anymore medication right now.  Patient reports his headache is gone now.

## 2020-08-03 NOTE — ED Triage Notes (Addendum)
Pt c/o HA to right side and back of head x 4 days-denies injury-denies fever/flu sx-NAD-steady gait

## 2020-08-16 ENCOUNTER — Other Ambulatory Visit: Payer: Self-pay

## 2020-08-16 ENCOUNTER — Emergency Department (HOSPITAL_BASED_OUTPATIENT_CLINIC_OR_DEPARTMENT_OTHER)
Admission: EM | Admit: 2020-08-16 | Discharge: 2020-08-16 | Disposition: A | Discharge status: Home / Self Care | Payer: Self-pay | Attending: Emergency Medicine | Admitting: Emergency Medicine

## 2020-08-16 ENCOUNTER — Encounter (HOSPITAL_BASED_OUTPATIENT_CLINIC_OR_DEPARTMENT_OTHER): Payer: Self-pay | Admitting: Emergency Medicine

## 2020-08-16 DIAGNOSIS — Z20822 Contact with and (suspected) exposure to covid-19: Secondary | ICD-10-CM | POA: Insufficient documentation

## 2020-08-16 DIAGNOSIS — R519 Headache, unspecified: Secondary | ICD-10-CM | POA: Insufficient documentation

## 2020-08-16 DIAGNOSIS — M7918 Myalgia, other site: Secondary | ICD-10-CM | POA: Insufficient documentation

## 2020-08-16 DIAGNOSIS — K529 Noninfective gastroenteritis and colitis, unspecified: Secondary | ICD-10-CM | POA: Insufficient documentation

## 2020-08-16 LAB — RESP PANEL BY RT-PCR (FLU A&B, COVID) ARPGX2
Influenza A by PCR: NEGATIVE
Influenza B by PCR: NEGATIVE
SARS Coronavirus 2 by RT PCR: NEGATIVE

## 2020-08-16 MED ORDER — ONDANSETRON 4 MG PO TBDP
8.0000 mg | ORAL_TABLET | Freq: Once | ORAL | Status: AC
  Administered 2020-08-16: 8 mg via ORAL
  Filled 2020-08-16: qty 2

## 2020-08-16 MED ORDER — ONDANSETRON HCL 4 MG PO TABS: 4.0000 mg | ORAL_TABLET | Freq: Four times a day (QID) | ORAL | 0 refills | Status: DC

## 2020-08-16 NOTE — ED Provider Notes (Signed)
[name] Provider Note   CSN: [id number] Arrival date & time: 08/16/20  [id number]     History Chief Complaint  Patient presents with   Abdominal Pain    Derrick Scott is a 45 y.o. male.  The history is provided by the patient.  Diarrhea Quality:  Watery Severity:  Moderate Onset quality:  Sudden Duration:  3 days Timing:  Intermittent Progression:  Unchanged Relieved by:  Nothing Worsened by:  Nothing Ineffective treatments: OTC cold medication. Associated symptoms: abdominal pain (cramping, diffuse), fever, headaches, myalgias and vomiting   Associated symptoms: no arthralgias, no chills and no recent cough   Risk factors: no sick contacts   Risk factors comment:  Vaccinated and boosted for COVID     Past Medical History:  Diagnosis Date   Medial meniscus tear 01/2012   right knee   Obesity    Pneumonia    Renal disorder    Vertigo, benign paroxysmal 2015    Patient Active Problem List   Diagnosis Date Noted   Hip pain 04/29/2020   Blood in stool 04/29/2020   Elevated glucose 12/25/2019   Internal hemorrhoids without complication [id number]   Other tear of medial meniscus, current injury, left knee, subsequent encounter 05/04/2017   Pes planus 02/07/2017   Laryngopharyngeal reflux (LPR) 07/24/2015   Post corneal transplant 07/25/2014   Benign paroxysmal positional vertigo 08/30/2013   Adrenal mass, right ([place]) 05/22/2013   Sleep apnea 07/31/2009   OBESITY, NOS 03/16/2006    Past Surgical History:  Procedure Laterality Date   CORNEAL TRANSPLANT  12/2009   KNEE ARTHROSCOPY  02/20/2012   Procedure: ARTHROSCOPY KNEE;  Surgeon: Alta Corning, MD;  Location: [place];  Service: Orthopedics;  Laterality: Right;  Chondroplasia patella femoral joint, medial meniscal debriedment       Family History  Problem Relation Age of Onset   Asthma Mother    Diabetes Father    Cancer Father        prostate    Osteoarthritis Sister    Diabetes Sister     Social History   Tobacco Use   Smoking status: Never   Smokeless tobacco: Never  Vaping Use   Vaping Use: Never used  Substance Use Topics   Alcohol use: No   Drug use: No    Home Medications Prior to Admission medications   Medication Sig Start Date End Date Taking? Authorizing Provider  albuterol (VENTOLIN HFA) 108 (90 Base) MCG/ACT inhaler Inhale 1-2 puffs into the lungs every 6 (six) hours as needed for wheezing or shortness of breath. 10/23/19   Dickie La, MD  ibuprofen (ADVIL) 800 MG tablet Take 1 tablet (800 mg total) by mouth 3 (three) times daily. 05/08/20   Mesner, Corene Cornea, MD  methocarbamol (ROBAXIN) 500 MG tablet Take 1 tablet (500 mg total) by mouth every 8 (eight) hours as needed for muscle spasms. 05/08/20   Mesner, Corene Cornea, MD  omeprazole (PRILOSEC) 20 MG capsule Take 1 capsule (20 mg total) by mouth daily. 04/29/20   Dickie La, MD    Allergies    Shrimp [shellfish allergy]  Review of Systems   Review of Systems  Constitutional:  Positive for fever. Negative for chills.  HENT:  Negative for ear pain and sore throat.   Eyes:  Negative for pain and visual disturbance.  Respiratory:  Negative for cough and shortness of breath.   Cardiovascular:  Negative for chest pain and palpitations.  Gastrointestinal:  Positive  for abdominal pain (cramping, diffuse), diarrhea and vomiting.  Genitourinary:  Negative for dysuria and hematuria.  Musculoskeletal:  Positive for myalgias. Negative for arthralgias and back pain.  Skin:  Negative for color change and rash.  Neurological:  Positive for headaches. Negative for seizures and syncope.  All other systems reviewed and are negative.  Physical Exam Updated Vital Signs BP 128/83 (BP Location: Right Arm)   Pulse 99   Temp 98.9 F (37.2 C) (Oral)   Resp 20   Ht '5\' 8"'$  (1.727 m)   Wt 129.7 kg   SpO2 97%   BMI 43.49 kg/m   Physical Exam Vitals and nursing note reviewed.   Constitutional:      Appearance: Normal appearance.  HENT:     Head: Normocephalic and atraumatic.  Eyes:     Conjunctiva/sclera: Conjunctivae normal.  Pulmonary:     Effort: Pulmonary effort is normal. No respiratory distress.  Abdominal:     Palpations: Abdomen is soft.     Tenderness: There is no abdominal tenderness. There is no guarding or rebound.  Musculoskeletal:        General: No deformity. Normal range of motion.     Cervical back: Normal range of motion.  Skin:    General: Skin is warm and dry.  Neurological:     General: No focal deficit present.     Mental Status: He is alert and oriented to person, place, and time. Mental status is at baseline.  Psychiatric:        Mood and Affect: Mood normal.    ED Results / Procedures / Treatments   Labs (all labs ordered are listed, but only abnormal results are displayed) Labs Reviewed  RESP PANEL BY RT-PCR (FLU A&B, COVID) ARPGX2    EKG None  Radiology No results found.  Procedures Procedures   Medications Ordered in ED Medications  ondansetron (ZOFRAN-ODT) disintegrating tablet 8 mg (has no administration in time range)    ED Course  I have reviewed the triage vital signs and the nursing notes.  Pertinent labs & imaging results that were available during my care of the patient were reviewed by me and considered in my medical decision making (see chart for details).    MDM Rules/Calculators/A&P                           [name] presents with several days of viral sounding symptoms with low-grade fever, nausea, vomiting, diarrhea, body aches, and headache.  He is well-appearing with normal vital signs and a benign abdominal exam not suggestive of serious intra-abdominal pathology.  He is testing negative for COVID-19.  Likely other viral illness.  We did discuss stool testing, but he was unable to produce a stool sample.  Symptomatic management was advised, and return precautions were  given. Final Clinical Impression(s) / ED Diagnoses Final diagnoses:  Gastroenteritis    Rx / DC Orders ED Discharge Orders          Ordered    ondansetron (ZOFRAN) 4 MG tablet  Every 6 hours        08/16/20 0937             Arnaldo Natal, MD 08/16/20 [phone]

## 2020-08-16 NOTE — ED Triage Notes (Signed)
Pt arrives pov with c/o fever, N/V/D \ general body aches and HA with congestion x 3 days. Endorses dayquil with tylenol at 0530 today

## 2020-09-13 ENCOUNTER — Emergency Department (HOSPITAL_BASED_OUTPATIENT_CLINIC_OR_DEPARTMENT_OTHER)
Admission: EM | Admit: 2020-09-13 | Discharge: 2020-09-13 | Disposition: A | Discharge status: Home / Self Care | Payer: Self-pay | Attending: Emergency Medicine | Admitting: Emergency Medicine

## 2020-09-13 ENCOUNTER — Other Ambulatory Visit: Payer: Self-pay

## 2020-09-13 ENCOUNTER — Encounter (HOSPITAL_BASED_OUTPATIENT_CLINIC_OR_DEPARTMENT_OTHER): Payer: Self-pay | Admitting: Emergency Medicine

## 2020-09-13 DIAGNOSIS — M25512 Pain in left shoulder: Secondary | ICD-10-CM | POA: Insufficient documentation

## 2020-09-13 DIAGNOSIS — G8929 Other chronic pain: Secondary | ICD-10-CM | POA: Diagnosis not present

## 2020-09-13 MED ORDER — IBUPROFEN 800 MG PO TABS
800.0000 mg | ORAL_TABLET | Freq: Once | ORAL | Status: AC
  Administered 2020-09-13: 800 mg via ORAL
  Filled 2020-09-13: qty 1

## 2020-09-13 NOTE — ED Provider Notes (Signed)
[name] EMERGENC[facility] DEPARTMENT Provider Note   CSN: [id number] Arrival date & time: 09/13/20  1412     History Chief Complaint  Patient presents with   Shoulder Pain    Derrick Scott is a 45 y.o. male with a past medical history of obesity resenting today with a complaint of left shoulder pain since April.  In April he was evaluated for suspected rotator cuff injury after a workout injury.  At that time he had a negative shoulder x-ray.  Was given a sling and follow-up with sports medicine.  He did not follow-up with sports medicine and is here today with similar complaints.  He says that the pain occurs when he is at work and has his arm abducted on the car window.  Says that his pain from April did somewhat resolve however over the last 3 days he has been in intense pain.  Pain does not radiate anywhere. denies any numbness or tingling.  Shoulder Pain Associated symptoms: no back pain and no neck pain       Past Medical History:  Diagnosis Date   Medial meniscus tear 01/2012   right knee   Obesity    Pneumonia    Renal disorder    Vertigo, benign paroxysmal 2015    Patient Active Problem List   Diagnosis Date Noted   Hip pain 04/29/2020   Blood in stool 04/29/2020   Elevated glucose 12/25/2019   Internal hemorrhoids without complication [id number]   Other tear of medial meniscus, current injury, left knee, subsequent encounter 05/04/2017   Pes planus 02/07/2017   Laryngopharyngeal reflux (LPR) 07/24/2015   Post corneal transplant 07/25/2014   Benign paroxysmal positional vertigo 08/30/2013   Adrenal mass, right ([place]) 05/22/2013   Sleep apnea 07/31/2009   OBESITY, NOS 03/16/2006    Past Surgical History:  Procedure Laterality Date   CORNEAL TRANSPLANT  12/2009   KNEE ARTHROSCOPY  02/20/2012   Procedure: ARTHROSCOPY KNEE;  Surgeon: Alta Corning, MD;  Location: [place];  Service: Orthopedics;  Laterality: Right;  Chondroplasia  patella femoral joint, medial meniscal debriedment       Family History  Problem Relation Age of Onset   Asthma Mother    Diabetes Father    Cancer Father        prostate   Osteoarthritis Sister    Diabetes Sister     Social History   Tobacco Use   Smoking status: Never   Smokeless tobacco: Never  Vaping Use   Vaping Use: Never used  Substance Use Topics   Alcohol use: No   Drug use: No    Home Medications Prior to Admission medications   Medication Sig Start Date End Date Taking? Authorizing Provider  albuterol (VENTOLIN HFA) 108 (90 Base) MCG/ACT inhaler Inhale 1-2 puffs into the lungs every 6 (six) hours as needed for wheezing or shortness of breath. 10/23/19   Dickie La, MD  ibuprofen (ADVIL) 800 MG tablet Take 1 tablet (800 mg total) by mouth 3 (three) times daily. 05/08/20   Mesner, Corene Cornea, MD  methocarbamol (ROBAXIN) 500 MG tablet Take 1 tablet (500 mg total) by mouth every 8 (eight) hours as needed for muscle spasms. 05/08/20   Mesner, Corene Cornea, MD  omeprazole (PRILOSEC) 20 MG capsule Take 1 capsule (20 mg total) by mouth daily. 04/29/20   Dickie La, MD  ondansetron (ZOFRAN) 4 MG tablet Take 1 tablet (4 mg total) by mouth every 6 (six) hours. 08/16/20  Arnaldo Natal, MD    Allergies    Shrimp [shellfish allergy]  Review of Systems   Review of Systems  Eyes:  Negative for photophobia and visual disturbance.  Musculoskeletal:  Negative for back pain, gait problem, joint swelling, neck pain and neck stiffness.       Left shoulder pain with movement.  Skin:  Negative for rash and wound.  Neurological:  Negative for dizziness, weakness, light-headedness, numbness and headaches.   Physical Exam Updated Vital Signs BP 126/89 (BP Location: Right Arm)   Pulse 86   Temp 98.7 F (37.1 C) (Oral)   Resp 16   Ht '5\' 8"'$  (1.727 m)   Wt 130.2 kg   SpO2 99%   BMI 43.64 kg/m   Physical Exam Vitals and nursing note reviewed.  Constitutional:      Appearance: Normal  appearance. He is obese.  HENT:     Head: Normocephalic and atraumatic.  Eyes:     General: No scleral icterus.    Conjunctiva/sclera: Conjunctivae normal.  Cardiovascular:     Rate and Rhythm: Normal rate and regular rhythm.  Pulmonary:     Effort: Pulmonary effort is normal. No respiratory distress.  Musculoskeletal:        General: No swelling, tenderness, deformity or signs of injury. Normal range of motion.     Cervical back: Normal range of motion.     Comments: Pain with abduction of left arm.  Positive apprehension and empty can test on left side.  No difficulties with right-sided movements.  No bony tenderness throughout clavicle or humeral head.  Skin:    General: Skin is warm and dry.     Findings: No bruising or rash.  Neurological:     Mental Status: He is alert.  Psychiatric:        Mood and Affect: Mood normal.    ED Results / Procedures / Treatments   Labs (all labs ordered are listed, but only abnormal results are displayed) Labs Reviewed - No data to display  EKG None  Radiology No results found.  Procedures Procedures   Medications Ordered in ED Medications  ibuprofen (ADVIL) tablet 800 mg (800 mg Oral Given 09/13/20 1535)    ED Course  I have reviewed the triage vital signs and the nursing notes.  Pertinent labs & imaging results that were available during my care of the patient were reviewed by me and considered in my medical decision making (see chart for details).   MDM Rules/Calculators/A&P Derrick Scott is a 45 y.o. male with a past medical history of obesity resenting today with a complaint of left shoulder pain since April.  In April he was evaluated for suspected rotator cuff injury after a workout injury.  At that time he had a negative shoulder x-ray.  Was given a sling and follow-up with sports medicine.  He did not follow-up with sports medicine and is here today with similar complaints.   Upon my physical exam and according to  patient with history his pain is worse with abduction.  Had a positive apprehension and empty can test on the left side.  Still has full range of motion however reports pain with shoulder rotation.  Reports he has not been using ice or heat packs however he started to wear his sling from April.  Says that this somewhat helps and that he needs to probably rest more and see sports medicine.  Did not have insurance at the time of referral.  Still  does not have insurance however will be getting from shortly.  He reports working 2 jobs that require movement.  I believe patient has a rotator cuff injury.  I do not believe he needs a shoulder x-ray and patient reports that he does not believe the pain to be a "problem of his bones."  I will give the patient a referral to sports medicine for evaluation of his chronic shoulder pain.  We also discussed that he may treat this pain and discomfort with ibuprofen.  He was notified of the maximum daily dose of ibuprofen and this information will be on discharge papers.  Denies any concerns or further questions.  Stable and agreeable to discharge.  Final Clinical Impression(s) / ED Diagnoses Final diagnoses:  Chronic left shoulder pain    Rx / DC Orders Results and diagnoses were explained to the patient. Return precautions discussed in full. Patient had no additional questions and expressed complete understanding.     Darliss Ridgel 09/13/20 1732    Margette Fast, MD 09/17/20 [phone]

## 2020-09-13 NOTE — ED Triage Notes (Signed)
Pt arrives pov with c/o L shoulder pain. Pt endorses injury in April, ROM has decreased over last few days.

## 2020-09-13 NOTE — Discharge Instructions (Addendum)
Your shoulder pain is not suspicious for a fracture.  You may have some inflammation that you may treat with ibuprofen and heat pads.  You should never take more than 2400 mg of Advil per day.  You may find it helpful to take 800 mg 3 times a day, morning afternoon and night.  Only take this as needed as long-term ibuprofen use may result in stomach ulcer.  Take this with food to help prevent such problems.  I would like you to follow-up with sports medicine so that they may evaluate your shoulder pain and potential rotator cuff dysfunction.  Continue to rest your shoulder so to not make your pain worse.  You may utilize your sling from April.

## 2020-09-22 ENCOUNTER — Other Ambulatory Visit: Payer: Self-pay

## 2020-09-22 ENCOUNTER — Encounter (HOSPITAL_BASED_OUTPATIENT_CLINIC_OR_DEPARTMENT_OTHER): Payer: Self-pay | Admitting: *Deleted

## 2020-09-22 ENCOUNTER — Telehealth: Payer: Self-pay

## 2020-09-22 ENCOUNTER — Emergency Department (HOSPITAL_BASED_OUTPATIENT_CLINIC_OR_DEPARTMENT_OTHER)
Admission: EM | Admit: 2020-09-22 | Discharge: 2020-09-22 | Disposition: A | Discharge status: Home / Self Care | Payer: Self-pay | Attending: Emergency Medicine | Admitting: Emergency Medicine

## 2020-09-22 DIAGNOSIS — U071 COVID-19: Secondary | ICD-10-CM | POA: Insufficient documentation

## 2020-09-22 LAB — RESP PANEL BY RT-PCR (FLU A&B, COVID) ARPGX2
Influenza A by PCR: NEGATIVE
Influenza B by PCR: NEGATIVE
SARS Coronavirus 2 by RT PCR: POSITIVE — AB

## 2020-09-22 MED ORDER — ACETAMINOPHEN 325 MG PO TABS
650.0000 mg | ORAL_TABLET | Freq: Once | ORAL | Status: AC
  Administered 2020-09-22: 650 mg via ORAL
  Filled 2020-09-22: qty 2

## 2020-09-22 MED ORDER — NIRMATRELVIR/RITONAVIR (PAXLOVID)TABLET: 3.0000 | ORAL_TABLET | Freq: Two times a day (BID) | ORAL | 0 refills | Status: AC

## 2020-09-22 MED ORDER — ACETAMINOPHEN 325 MG PO TABS: 650.0000 mg | ORAL_TABLET | Freq: Once | ORAL | Status: DC

## 2020-09-22 NOTE — Telephone Encounter (Signed)
Patient calls nurse line requesting a return to work note with no restrictions. Patient reports he went the ED on 8/28 for shoulder pain and was given a return to work note for 9/5. Patient reports he gave this to his employer, however they need a more specific note. We do not have any apts this week. Please advise on note.   Note needs to state he can return to full duty with no restrictions.

## 2020-09-22 NOTE — ED Provider Notes (Signed)
[facility]PARTMENT Provider Note   CSN: [id number] Arrival date & time: 09/22/20  1553     History Chief Complaint  Patient presents with   URI    Derrick Scott is a 45 y.o. male.  Past medical history of obesity, sleep apnea.  Patient has had sore throat, headache, body aches, fever since last night.  He denies any sick contacts that he knows of.  He denies any chest pain, shortness of breath, cough, abdominal pain, nausea, vomiting, diarrhea, constipation.   URI Presenting symptoms: congestion, fever, rhinorrhea and sore throat   Presenting symptoms: no cough   Associated symptoms: headaches and myalgias       Past Medical History:  Diagnosis Date   Medial meniscus tear 01/2012   right knee   Obesity    Pneumonia    Renal disorder    Vertigo, benign paroxysmal 2015    Patient Active Problem List   Diagnosis Date Noted   Hip pain 04/29/2020   Blood in stool 04/29/2020   Elevated glucose 12/25/2019   Internal hemorrhoids without complication [id number]   Other tear of medial meniscus, current injury, left knee, subsequent encounter 05/04/2017   Pes planus 02/07/2017   Laryngopharyngeal reflux (LPR) 07/24/2015   Post corneal transplant 07/25/2014   Benign paroxysmal positional vertigo 08/30/2013   Adrenal mass, right ([place]) 05/22/2013   Sleep apnea 07/31/2009   OBESITY, NOS 03/16/2006    Past Surgical History:  Procedure Laterality Date   CORNEAL TRANSPLANT  12/2009   KNEE ARTHROSCOPY  02/20/2012   Procedure: ARTHROSCOPY KNEE;  Surgeon: Alta Corning, MD;  Location: [facility];  Service: Orthopedics;  Laterality: Right;  Chondroplasia patella femoral joint, medial meniscal debriedment       Family History  Problem Relation Age of Onset   Asthma Mother    Diabetes Father    Cancer Father        prostate   Osteoarthritis Sister    Diabetes Sister     Social History   Tobacco Use   Smoking status: Never    Smokeless tobacco: Never  Vaping Use   Vaping Use: Never used  Substance Use Topics   Alcohol use: No   Drug use: No    Home Medications Prior to Admission medications   Medication Sig Start Date End Date Taking? Authorizing Provider  albuterol (VENTOLIN HFA) 108 (90 Base) MCG/ACT inhaler Inhale 1-2 puffs into the lungs every 6 (six) hours as needed for wheezing or shortness of breath. 10/23/19  Yes Dickie La, MD  nirmatrelvir/ritonavir EUA (PAXLOVID) 20 x 150 MG & 10 x '100MG'$  TABS Take 3 tablets by mouth 2 (two) times daily for 5 days. Patient GFR is >60. Take nirmatrelvir (150 mg) two tablets twice daily for 5 days and ritonavir (100 mg) one tablet twice daily for 5 days. 09/22/20 09/27/20 Yes Serine Kea, Adora Fridge, PA-C  ibuprofen (ADVIL) 800 MG tablet Take 1 tablet (800 mg total) by mouth 3 (three) times daily. 05/08/20   Mesner, Corene Cornea, MD  methocarbamol (ROBAXIN) 500 MG tablet Take 1 tablet (500 mg total) by mouth every 8 (eight) hours as needed for muscle spasms. 05/08/20   Mesner, Corene Cornea, MD  omeprazole (PRILOSEC) 20 MG capsule Take 1 capsule (20 mg total) by mouth daily. 04/29/20   Dickie La, MD  ondansetron (ZOFRAN) 4 MG tablet Take 1 tablet (4 mg total) by mouth every 6 (six) hours. 08/16/20   Arnaldo Natal, MD  Allergies    Shrimp [shellfish allergy]  Review of Systems   Review of Systems  Constitutional:  Positive for chills and fever.  HENT:  Positive for congestion, rhinorrhea and sore throat.   Eyes:  Negative for visual disturbance.  Respiratory:  Negative for cough, chest tightness and shortness of breath.   Cardiovascular:  Negative for chest pain, palpitations and leg swelling.  Gastrointestinal:  Negative for abdominal pain, constipation, diarrhea, nausea and vomiting.  Genitourinary:  Negative for difficulty urinating.  Musculoskeletal:  Positive for myalgias. Negative for back pain.  Skin:  Negative for rash and wound.  Neurological:  Positive for headaches. Negative  for dizziness, syncope, weakness and light-headedness.  All other systems reviewed and are negative.  Physical Exam Updated Vital Signs BP (!) 143/92 (BP Location: Right Arm)   Pulse (!) 117   Temp (!) 100.7 F (38.2 C) (Oral)   Resp 20   Ht '5\' 8"'$  (1.727 m)   Wt 130.2 kg   SpO2 100%   BMI 43.64 kg/m   Physical Exam Vitals and nursing note reviewed.  Constitutional:      General: He is not in acute distress.    Appearance: Normal appearance. He is not ill-appearing, toxic-appearing or diaphoretic.  HENT:     Head: Normocephalic and atraumatic.     Mouth/Throat:     Mouth: Mucous membranes are moist.     Pharynx: Oropharynx is clear. Posterior oropharyngeal erythema present. No oropharyngeal exudate.  Eyes:     General: No scleral icterus.       Right eye: No discharge.        Left eye: No discharge.     Conjunctiva/sclera: Conjunctivae normal.  Cardiovascular:     Rate and Rhythm: Normal rate and regular rhythm.     Pulses: Normal pulses.     Heart sounds: Normal heart sounds, S1 normal and S2 normal. No murmur heard.   No friction rub. No gallop.  Pulmonary:     Effort: Pulmonary effort is normal. No respiratory distress.     Breath sounds: Normal breath sounds. No wheezing, rhonchi or rales.  Abdominal:     General: Abdomen is flat. Bowel sounds are normal. There is no distension.     Palpations: Abdomen is soft. There is no pulsatile mass.     Tenderness: There is no abdominal tenderness. There is no guarding or rebound.  Musculoskeletal:     Right lower leg: No edema.     Left lower leg: No edema.  Skin:    General: Skin is warm and dry.     Coloration: Skin is not jaundiced.     Findings: No bruising, erythema, lesion or rash.  Neurological:     General: No focal deficit present.     Mental Status: He is alert and oriented to person, place, and time.  Psychiatric:        Mood and Affect: Mood normal.        Behavior: Behavior normal.    ED Results /  Procedures / Treatments   Labs (all labs ordered are listed, but only abnormal results are displayed) Labs Reviewed  RESP PANEL BY RT-PCR (FLU A&B, COVID) ARPGX2 - Abnormal; Notable for the following components:      Result Value   SARS Coronavirus 2 by RT PCR POSITIVE (*)    All other components within normal limits    EKG None  Radiology No results found.  Procedures Procedures   Medications Ordered in ED Medications  acetaminophen (TYLENOL) tablet 650 mg (650 mg Oral Given 09/22/20 1620)    ED Course  I have reviewed the triage vital signs and the nursing notes.  Pertinent labs & imaging results that were available during my care of the patient were reviewed by me and considered in my medical decision making (see chart for details).    MDM Rules/Calculators/A&P                         Patient with upper respiratory symptoms.  Patient is febrile with tachycardia.  Tylenol given.  Tachycardia is likely secondary to fever. Tested positive for COVID.  Lungs are clear to auscultation with no signs of pneumonia.  On reexamination, patient with some improvement of fever and tachycardia after giving Tylenol.   Patient given isolation instructions. Instructed patient to alternate Tylenol and ibuprofen to treat fever and myalgias.  Prescribed paxlovid.  Patient told to return if he develops develops worsening shortness of breath, chest pain, or if symptoms do not improve within a couple of days.       Final Clinical Impression(s) / ED Diagnoses Final diagnoses:  COVID-19    Rx / DC Orders ED Discharge Orders          Ordered    nirmatrelvir/ritonavir EUA (PAXLOVID) 20 x 150 MG & 10 x '100MG'$  TABS  2 times daily        09/22/20 1753             Adolphus Birchwood, PA-C 09/22/20 1816    Gareth Morgan, MD 09/23/20 2306

## 2020-09-22 NOTE — ED Triage Notes (Signed)
Fever, body aches, chills, sore throat and fatigue. Covid exposure.

## 2020-09-22 NOTE — Discharge Instructions (Addendum)
You have tested positive for COVID-19. Please stay home for at least 5 days from symptom onset. Isolate from others in your home. You are likely most infectious during these first 5 days.  You may end isolation after day 5 if you are fever free for 24 hours AND your symptoms are improving. Please continue to wear a mask in public until day 10.   If you have?moderate illness?(if you experienced shortness of breath or had difficulty breathing), or?severe illness?(you were hospitalized) due to COVID-19, or you have a weakened immune system, you need to isolate through day 10.  Please return if you develop difficulty breathing, chest pain, or worsening symptoms after day 5, please return to the emergency department.  Please use Tylenol or ibuprofen for pain and fever.  You may use 600 mg ibuprofen every 6 hours or 1000 mg of Tylenol every 6 hours.  You may choose to alternate between the 2.  This would be most effective.  Not to exceed 4 g of Tylenol within 24 hours.  Not to exceed 3200 mg ibuprofen 24 hours.

## 2020-09-23 NOTE — Telephone Encounter (Signed)
I called patient to scheduled with provider. Patient reports he went to the ED yesterday and was diagnosed with Covid. Patient will call us to schedule when he is feeling better.

## 2020-09-29 ENCOUNTER — Encounter (HOSPITAL_BASED_OUTPATIENT_CLINIC_OR_DEPARTMENT_OTHER): Payer: Self-pay | Admitting: Emergency Medicine

## 2020-09-29 ENCOUNTER — Emergency Department (HOSPITAL_BASED_OUTPATIENT_CLINIC_OR_DEPARTMENT_OTHER)
Admission: EM | Admit: 2020-09-29 | Discharge: 2020-09-29 | Disposition: A | Discharge status: Home / Self Care | Payer: Self-pay | Attending: Emergency Medicine | Admitting: Emergency Medicine

## 2020-09-29 ENCOUNTER — Emergency Department (HOSPITAL_BASED_OUTPATIENT_CLINIC_OR_DEPARTMENT_OTHER): Payer: Self-pay

## 2020-09-29 ENCOUNTER — Other Ambulatory Visit: Payer: Self-pay

## 2020-09-29 DIAGNOSIS — J45909 Unspecified asthma, uncomplicated: Secondary | ICD-10-CM | POA: Insufficient documentation

## 2020-09-29 DIAGNOSIS — R059 Cough, unspecified: Secondary | ICD-10-CM

## 2020-09-29 DIAGNOSIS — U071 COVID-19: Secondary | ICD-10-CM | POA: Insufficient documentation

## 2020-09-29 DIAGNOSIS — R0789 Other chest pain: Secondary | ICD-10-CM | POA: Diagnosis not present

## 2020-09-29 LAB — BASIC METABOLIC PANEL
Anion gap: 6 (ref 5–15)
BUN: 9 mg/dL (ref 6–20)
CO2: 30 mmol/L (ref 22–32)
Calcium: 9.1 mg/dL (ref 8.9–10.3)
Chloride: 100 mmol/L (ref 98–111)
Creatinine, Ser: 1.11 mg/dL (ref 0.61–1.24)
GFR, Estimated: >60 mL/min (ref >60)
Glucose, Bld: 121 mg/dL — ABNORMAL HIGH (ref 70–99)
Potassium: 3.8 mmol/L (ref 3.5–5.1)
Sodium: 136 mmol/L (ref 135–145)

## 2020-09-29 LAB — CBC
HCT: 44.6 % (ref 39.0–52.0)
Hemoglobin: 14.9 g/dL (ref 13.0–17.0)
MCH: 29.2 pg (ref 26.0–34.0)
MCHC: 33.4 g/dL (ref 30.0–36.0)
MCV: 87.3 fL (ref 80.0–100.0)
Platelets: 326 10*3/uL (ref 150–400)
RBC: 5.11 MIL/uL (ref 4.22–5.81)
RDW: 13.2 % (ref 11.5–15.5)
WBC: 8 10*3/uL (ref 4.0–10.5)
nRBC: 0 % (ref 0.0–0.2)

## 2020-09-29 LAB — TROPONIN I (HIGH SENSITIVITY): Troponin I (High Sensitivity): 3 ng/L (ref <18)

## 2020-09-29 MED ORDER — PREDNISONE 50 MG PO TABS: 50.0000 mg | ORAL_TABLET | Freq: Every day | ORAL | 0 refills | Status: AC

## 2020-09-29 MED ORDER — PROMETHAZINE-DM 6.25-15 MG/5ML PO SYRP: 5.0000 mL | ORAL_SOLUTION | Freq: Four times a day (QID) | ORAL | 0 refills | Status: DC | PRN

## 2020-09-29 MED ORDER — BENZONATATE 100 MG PO CAPS: 100.0000 mg | ORAL_CAPSULE | Freq: Three times a day (TID) | ORAL | 0 refills | Status: DC

## 2020-09-29 NOTE — ED Notes (Signed)
ED Provider at bedside. 

## 2020-09-29 NOTE — ED Triage Notes (Signed)
Sharp chest pain radiating to back. Covid + x 1 week.  Denies shortness of breath.

## 2020-09-29 NOTE — ED Notes (Signed)
First contact with patient. Patient arrived via triage with complaints of sternal intermittent chest pressure. Patient is covid + and started complaining of chest discomfort. Pt is A&OX 4. Respirations even/unlabored with diminished breath sounds at bases. Patient changed into gown and placed on monitor and call light within reach. Patient updated on plan of care. Will continue to monitor patient.

## 2020-09-29 NOTE — Discharge Instructions (Signed)
Take prednisone as prescribed. Use your inhaler up to 4 times a day to help with cough and chest tightness. Use the cough drops and syrup stop the call. Follow with your primary care doctor as needed for recheck. Return to the emergency room if you develop high fevers, persistent chest pain, coughing up blood, or any new, worsening, or concerning symptoms.

## 2020-09-29 NOTE — ED Provider Notes (Signed)
[facility] DEPART[facility] Provider Note   CSN: [id number] Arrival date & time: 09/29/20  1025     History Chief Complaint  Patient presents with   Chest Pain    Covid +    Derrick Scott is a 45 y.o. male presenting for evaluation of intermittent chest pain and persistent cough in the setting of a recent COVID infection.  Patient states he was diagnosed with COVID last week.  He states he started to have COVID, symptoms improved.  He finished backslid on Sunday, 2 days ago.  Since then, he has slightly worsening cough, states he is coughing up green mucus.  He also reports intermittent chest pain, most on the left side of his chest.  This is more likely to happen when coughing.  No fevers or chills.  No body aches.  He is not having any hemoptysis.  No pain with inspiration.  No history of blood clots.  He does have a history of asthma, has not been using this for his symptoms.  No other medical problems  HPI     Past Medical History:  Diagnosis Date   Medial meniscus tear 01/2012   right knee   Obesity    Pneumonia    Renal disorder    Vertigo, benign paroxysmal 2015    Patient Active Problem List   Diagnosis Date Noted   Hip pain 04/29/2020   Blood in stool 04/29/2020   Elevated glucose 12/25/2019   Internal hemorrhoids without complication [id number]   Other tear of medial meniscus, current injury, left knee, subsequent encounter 05/04/2017   Pes planus 02/07/2017   Laryngopharyngeal reflux (LPR) 07/24/2015   Post corneal transplant 07/25/2014   Benign paroxysmal positional vertigo 08/30/2013   Adrenal mass, right ([place]) 05/22/2013   Sleep apnea 07/31/2009   OBESITY, NOS 03/16/2006    Past Surgical History:  Procedure Laterality Date   CORNEAL TRANSPLANT  12/2009   KNEE ARTHROSCOPY  02/20/2012   Procedure: ARTHROSCOPY KNEE;  Surgeon: Alta Corning, MD;  Location: [facility];  Service: Orthopedics;  Laterality: Right;   Chondroplasia patella femoral joint, medial meniscal debriedment       Family History  Problem Relation Age of Onset   Asthma Mother    Diabetes Father    Cancer Father        prostate   Osteoarthritis Sister    Diabetes Sister     Social History   Tobacco Use   Smoking status: Never   Smokeless tobacco: Never  Vaping Use   Vaping Use: Never used  Substance Use Topics   Alcohol use: No   Drug use: No    Home Medications Prior to Admission medications   Medication Sig Start Date End Date Taking? Authorizing Provider  benzonatate (TESSALON) 100 MG capsule Take 1 capsule (100 mg total) by mouth every 8 (eight) hours. 09/29/20  Yes Kimbley Sprague, PA-C  predniSONE (DELTASONE) 50 MG tablet Take 1 tablet (50 mg total) by mouth daily for 5 days. 09/29/20 10/04/20 Yes Leory Allinson, PA-C  promethazine-dextromethorphan (PROMETHAZINE-DM) 6.25-15 MG/5ML syrup Take 5 mLs by mouth 4 (four) times daily as needed for cough. 09/29/20  Yes Philippa Vessey, PA-C  albuterol (VENTOLIN HFA) 108 (90 Base) MCG/ACT inhaler Inhale 1-2 puffs into the lungs every 6 (six) hours as needed for wheezing or shortness of breath. 10/23/19   Dickie La, MD  ibuprofen (ADVIL) 800 MG tablet Take 1 tablet (800 mg total) by mouth 3 (three) times  daily. 05/08/20   Mesner, Corene Cornea, MD  methocarbamol (ROBAXIN) 500 MG tablet Take 1 tablet (500 mg total) by mouth every 8 (eight) hours as needed for muscle spasms. 05/08/20   Mesner, Corene Cornea, MD  omeprazole (PRILOSEC) 20 MG capsule Take 1 capsule (20 mg total) by mouth daily. 04/29/20   Dickie La, MD  ondansetron (ZOFRAN) 4 MG tablet Take 1 tablet (4 mg total) by mouth every 6 (six) hours. 08/16/20   Arnaldo Natal, MD    Allergies    Shrimp [shellfish allergy]  Review of Systems   Review of Systems  Respiratory:  Positive for cough.   Cardiovascular:  Positive for chest pain (intermittent).  All other systems reviewed and are negative.  Physical Exam Updated  Vital Signs BP 128/90 (BP Location: Left Arm)   Pulse 84   Temp 98.6 F (37 C) (Oral)   Resp 16   Ht '5\' 8"'$  (1.727 m)   Wt 130.2 kg   SpO2 100%   BMI 43.64 kg/m   Physical Exam Vitals and nursing note reviewed.  Constitutional:      General: He is not in acute distress.    Appearance: Normal appearance. He is obese.     Comments: Resting in bed in no acute distress  HENT:     Head: Normocephalic and atraumatic.  Eyes:     Conjunctiva/sclera: Conjunctivae normal.     Pupils: Pupils are equal, round, and reactive to light.  Cardiovascular:     Rate and Rhythm: Normal rate and regular rhythm.     Pulses: Normal pulses.  Pulmonary:     Effort: Pulmonary effort is normal. No respiratory distress.     Breath sounds: Normal breath sounds. No wheezing.     Comments: Speaking in full sentences.  Scattered expiratory wheezing noted.  Sats 100% on room air.  Dry cough noted on exam. Abdominal:     General: There is no distension.     Palpations: Abdomen is soft.     Tenderness: There is no abdominal tenderness.  Musculoskeletal:        General: Normal range of motion.     Cervical back: Normal range of motion and neck supple.     Right lower leg: No edema.     Left lower leg: No edema.  Skin:    General: Skin is warm and dry.     Capillary Refill: Capillary refill takes less than 2 seconds.  Neurological:     Mental Status: He is alert and oriented to person, place, and time.  Psychiatric:        Mood and Affect: Mood and affect normal.        Speech: Speech normal.        Behavior: Behavior normal.    ED Results / Procedures / Treatments   Labs (all labs ordered are listed, but only abnormal results are displayed) Labs Reviewed  BASIC METABOLIC PANEL - Abnormal; Notable for the following components:      Result Value   Glucose, Bld 121 (*)    All other components within normal limits  CBC  TROPONIN I (HIGH SENSITIVITY)  TROPONIN I (HIGH SENSITIVITY)     EKG None  Radiology DG Chest Port 1 View  Result Date: 09/29/2020 CLINICAL DATA:  Sharp chest pain radiating to the back, history of COVID-19 positivity EXAM: PORTABLE CHEST 1 VIEW COMPARISON:  08/21/2019 FINDINGS: The heart size and mediastinal contours are within normal limits. Both lungs are clear. The visualized  skeletal structures are unremarkable. IMPRESSION: No active disease. Electronically Signed   By: Inez Catalina M.D.   On: 09/29/2020 13:26    Procedures Procedures   Medications Ordered in ED Medications - No data to display  ED Course  I have reviewed the triage vital signs and the nursing notes.  Pertinent labs & imaging results that were available during my care of the patient were reviewed by me and considered in my medical decision making (see chart for details).    MDM Rules/Calculators/A&P                           Patient resenting for evaluation of cough and intermittent chest pain in the setting of recent COVID infection.  He states he is mostly here to make sure he does not have pneumonia.  On exam, patient appears nontoxic.  Pulmonary exam is overall reassuring.  There is scattered wheezing, I feel there is likely a component of asthma flare to this.  Chest x-ray viewed and independently interpreted by me, no pneumonia pneumothorax, effusion.  Labs obtained from triage reassuring.  Troponin negative.  EKG is nonischemic.  Discussed findings with patient.  Discussed use of albuterol inhaler that he has at home.  Will add on prednisone and cough medicine to help with asthma.  Encouraged him to follow-up with PCP as needed for recheck.  At this time, patient appears safe for discharge.  Return precautions given.  Patient states he understands and agrees to plan.   Final Clinical Impression(s) / ED Diagnoses Final diagnoses:  COVID-19  Cough    Rx / DC Orders ED Discharge Orders          Ordered    predniSONE (DELTASONE) 50 MG tablet  Daily         09/29/20 1437    benzonatate (TESSALON) 100 MG capsule  Every 8 hours        09/29/20 1437    promethazine-dextromethorphan (PROMETHAZINE-DM) 6.25-15 MG/5ML syrup  4 times daily PRN        09/29/20 1437             Latrell Reitan, PA-C 09/29/20 1438    Horton, Alvin Critchley, DO 09/29/20 1502

## 2020-10-05 ENCOUNTER — Encounter: Payer: Self-pay | Admitting: Family Medicine

## 2020-10-07 MED ORDER — ACETAMINOPHEN-CODEINE 300-30 MG PO TABS: 1.0000 | ORAL_TABLET | Freq: Four times a day (QID) | ORAL | 0 refills | Status: AC | PRN

## 2020-10-24 ENCOUNTER — Other Ambulatory Visit: Payer: Self-pay

## 2020-10-24 ENCOUNTER — Emergency Department (HOSPITAL_BASED_OUTPATIENT_CLINIC_OR_DEPARTMENT_OTHER)
Admission: EM | Admit: 2020-10-24 | Discharge: 2020-10-24 | Disposition: A | Discharge status: Home / Self Care | Payer: Self-pay | Attending: Student | Admitting: Student

## 2020-10-24 ENCOUNTER — Encounter (HOSPITAL_BASED_OUTPATIENT_CLINIC_OR_DEPARTMENT_OTHER): Payer: Self-pay | Admitting: Emergency Medicine

## 2020-10-24 DIAGNOSIS — R42 Dizziness and giddiness: Secondary | ICD-10-CM | POA: Insufficient documentation

## 2020-10-24 DIAGNOSIS — Z79899 Other long term (current) drug therapy: Secondary | ICD-10-CM | POA: Insufficient documentation

## 2020-10-24 DIAGNOSIS — R112 Nausea with vomiting, unspecified: Secondary | ICD-10-CM | POA: Insufficient documentation

## 2020-10-24 MED ORDER — MECLIZINE HCL 25 MG PO TABS
50.0000 mg | ORAL_TABLET | Freq: Once | ORAL | Status: AC
  Administered 2020-10-24: 50 mg via ORAL
  Filled 2020-10-24: qty 2

## 2020-10-24 MED ORDER — MECLIZINE HCL 25 MG PO TABS: 25.0000 mg | ORAL_TABLET | Freq: Three times a day (TID) | ORAL | 0 refills | Status: DC | PRN

## 2020-10-24 MED ORDER — ONDANSETRON 4 MG PO TBDP
4.0000 mg | ORAL_TABLET | Freq: Once | ORAL | Status: AC
  Administered 2020-10-24: 4 mg via ORAL
  Filled 2020-10-24: qty 1

## 2020-10-24 MED ORDER — ONDANSETRON 4 MG PO TBDP: 4.0000 mg | ORAL_TABLET | Freq: Three times a day (TID) | ORAL | 0 refills | Status: DC | PRN

## 2020-10-24 NOTE — ED Notes (Signed)
PA at bedside.

## 2020-10-24 NOTE — ED Triage Notes (Addendum)
STATES WAS FINE YESTERDAY woke up this am with vertigo and dizziness , , nausea, has hx of vertigo, pt states he drove himself here , warned not to do that if he was that dizzy

## 2020-10-24 NOTE — ED Provider Notes (Signed)
[facility] EMERGENC[facility] DEPARTMENT Provider Note   CSN: [id number] Arrival date & time: 10/24/20  1218     History Chief Complaint  Patient presents with   Dizziness    Derrick Scott is a 45 y.o. male who presents to the emergency department for further evaluation of dizziness that began upon waking up this morning.  He does endorse a history of vertigo and states that this is similar to those episodes however much more severe.  He feels unsteady on his feet and his dizziness is triggered when he looks to the right.  His dizziness is worse with quick positional movements and has been waxing and waning since onset.  He currently rates dizziness moderate severity.  He reports associated nausea and vomiting but denies any fever, chills, cough, congestion, diarrhea, focal weakness/numbness to the upper or lower extremities, trouble talking, loss of consciousness, ear pain, abdominal pain, chest pain, and shortness of breath.  He is not taking any medications for this.   Dizziness     Past Medical History:  Diagnosis Date   Medial meniscus tear 01/2012   right knee   Obesity    Pneumonia    Renal disorder    Vertigo, benign paroxysmal 2015    Patient Active Problem List   Diagnosis Date Noted   Hip pain 04/29/2020   Blood in stool 04/29/2020   Elevated glucose 12/25/2019   Internal hemorrhoids without complication 59/74/1638   Other tear of medial meniscus, current injury, left knee, subsequent encounter 05/04/2017   Pes planus 02/07/2017   Laryngopharyngeal reflux (LPR) 07/24/2015   Post corneal transplant 07/25/2014   Benign paroxysmal positional vertigo 08/30/2013   Adrenal mass, right ([place]) 05/22/2013   Sleep apnea 07/31/2009   OBESITY, NOS 03/16/2006    Past Surgical History:  Procedure Laterality Date   CORNEAL TRANSPLANT  12/2009   KNEE ARTHROSCOPY  02/20/2012   Procedure: ARTHROSCOPY KNEE;  Surgeon: Alta Corning, MD;  Location: [facility];  Service: Orthopedics;  Laterality: Right;  Chondroplasia patella femoral joint, medial meniscal debriedment       Family History  Problem Relation Age of Onset   Asthma Mother    Diabetes Father    Cancer Father        prostate   Osteoarthritis Sister    Diabetes Sister     Social History   Tobacco Use   Smoking status: Never   Smokeless tobacco: Never  Vaping Use   Vaping Use: Never used  Substance Use Topics   Alcohol use: No   Drug use: No    Home Medications Prior to Admission medications   Medication Sig Start Date End Date Taking? Authorizing Provider  albuterol (VENTOLIN HFA) 108 (90 Base) MCG/ACT inhaler Inhale 1-2 puffs into the lungs every 6 (six) hours as needed for wheezing or shortness of breath. 10/23/19  Yes Dickie La, MD  meclizine (ANTIVERT) 25 MG tablet Take 1 tablet (25 mg total) by mouth 3 (three) times daily as needed for dizziness. 10/24/20  Yes Raul Del, Manan Olmo M, PA-C  omeprazole (PRILOSEC) 20 MG capsule Take 1 capsule (20 mg total) by mouth daily. 04/29/20  Yes Dickie La, MD  ondansetron (ZOFRAN ODT) 4 MG disintegrating tablet Take 1 tablet (4 mg total) by mouth every 8 (eight) hours as needed for nausea or vomiting. 10/24/20  Yes Jhordyn Hoopingarner M, PA-C  promethazine-dextromethorphan (PROMETHAZINE-DM) 6.25-15 MG/5ML syrup Take 5 mLs by mouth 4 (four) times daily as needed  for cough. 09/29/20  Yes Caccavale, Sophia, PA-C  benzonatate (TESSALON) 100 MG capsule Take 1 capsule (100 mg total) by mouth every 8 (eight) hours. 09/29/20   Caccavale, Sophia, PA-C  ibuprofen (ADVIL) 800 MG tablet Take 1 tablet (800 mg total) by mouth 3 (three) times daily. 05/08/20   Mesner, Corene Cornea, MD  methocarbamol (ROBAXIN) 500 MG tablet Take 1 tablet (500 mg total) by mouth every 8 (eight) hours as needed for muscle spasms. 05/08/20   Mesner, Corene Cornea, MD  ondansetron (ZOFRAN) 4 MG tablet Take 1 tablet (4 mg total) by mouth every 6 (six) hours. 08/16/20   Arnaldo Natal, MD     Allergies    Shrimp [shellfish allergy]  Review of Systems   Review of Systems  Neurological:  Positive for dizziness.  All other systems reviewed and are negative.  Physical Exam Updated Vital Signs BP (!) 150/108 (BP Location: Right Arm)   Pulse 85   Temp 98.3 F (36.8 C) (Oral)   Resp 14   Ht 5\' 8"  (1.727 m)   Wt 130.2 kg   SpO2 100%   BMI 43.64 kg/m   Physical Exam Constitutional:      General: He is not in acute distress.    Appearance: Normal appearance.  HENT:     Head: Normocephalic and atraumatic.  Eyes:     General:        Right eye: No discharge.        Left eye: No discharge.     Extraocular Movements: Extraocular movements intact.     Pupils: Pupils are equal, round, and reactive to light.     Comments: No nystagmus.  Neck:     Comments: No neck tenderness. Cardiovascular:     Comments: Regular rate and rhythm.  S1/S2 are distinct without any evidence of murmur, rubs, or gallops.  Radial pulses are 2+ bilaterally.  Dorsalis pedis pulses are 2+ bilaterally.  No evidence of pedal edema. Pulmonary:     Comments: Clear to auscultation bilaterally.  Normal effort.  No respiratory distress.  No evidence of wheezes, rales, or rhonchi heard throughout. Abdominal:     General: Abdomen is flat. Bowel sounds are normal. There is no distension.     Tenderness: There is no abdominal tenderness. There is no guarding or rebound.  Musculoskeletal:        General: Normal range of motion.     Cervical back: Neck supple.  Skin:    General: Skin is warm and dry.     Findings: No rash.  Neurological:     General: No focal deficit present.     Mental Status: He is alert.     Comments: Cranial nerves Scott to XII are intact.  5/5 strength in the upper and lower extremities.  Normal sensation in the upper and lower extremities.  No dysdiadochokinesia with rapid alternating movements.  Normal finger-to-nose.  Normal heel shin.  Patient was able to demonstrate normal gait  including walking on his toes and heels.  He did appear somewhat unsteady at times primarily with turning around.  Psychiatric:        Mood and Affect: Mood normal.        Behavior: Behavior normal.    ED Results / Procedures / Treatments   Labs (all labs ordered are listed, but only abnormal results are displayed) Labs Reviewed - No data to display  EKG None  Radiology No results found.  Procedures Procedures   Medications Ordered in ED Medications  meclizine (ANTIVERT) tablet 50 mg (50 mg Oral Given 10/24/20 1407)  ondansetron (ZOFRAN-ODT) disintegrating tablet 4 mg (4 mg Oral Given 10/24/20 1406)    ED Course  I have reviewed the triage vital signs and the nursing notes.  Pertinent labs & imaging results that were available during my care of the patient were reviewed by me and considered in my medical decision making (see chart for details).    MDM Rules/Calculators/A&P                          Mitchel Delduca Scott is a 45 y.o. male who presents today for further evaluation of dizziness which is most consistent with a peripheral cause.  No history of recent infection I have a low suspicion for vestibular neuritis.  History is not consistent with Mnire's disease at this time.  No history of obvious trauma.  No other red flags that would lead me to a central vertigo at this time to include gradual onset, not fatigable nystagmus, focal neurological findings on my physical exam.  His presentation is not consistent with any acute CNS infection or intracranial hemorrhage at this time.  His dizziness and nausea was improved with Zofran and meclizine given in the department.  I have given him a prescription to go home with with strict return precautions.  I will have him follow-up with his primary care provider within the next week to ensure we are going in the right direction.  Patient exhibited full understanding and was amenable to this plan.  He is hemodynamically stable and safe  for discharge.   Final Clinical Impression(s) / ED Diagnoses Final diagnoses:  Dizziness    Rx / DC Orders ED Discharge Orders          Ordered    meclizine (ANTIVERT) 25 MG tablet  3 times daily PRN        10/24/20 1604    ondansetron (ZOFRAN ODT) 4 MG disintegrating tablet  Every 8 hours PRN        10/24/20 1604             Myna Bright [place], [place] 10/24/20 1611    Teressa Lower, MD 10/24/20 1616

## 2020-10-24 NOTE — ED Notes (Signed)
ED Provider at bedside. 

## 2020-10-24 NOTE — Discharge Instructions (Addendum)
You were seen and evaluated in the emergency department today for further evaluation of dizziness.  As we discussed, this is likely from the inner ear.  I have a low suspicion that this is coming from your brain at this time as your neurological exam was negative.  I will give you a prescription for meclizine for dizziness and a prescription for Zofran for nausea.  Please take as needed  Please return to the emergency department if you experience worsening dizziness, numbness/weakness in upper arm or leg, trouble walking, trouble speaking, facial droop, loss of consciousness, or any other concerns you might have.  Please follow up with your PCP in the next week for further evaluation.

## 2020-11-23 ENCOUNTER — Encounter: Payer: Self-pay | Admitting: Family Medicine

## 2020-11-24 ENCOUNTER — Other Ambulatory Visit: Payer: Self-pay | Admitting: Family Medicine

## 2020-11-24 DIAGNOSIS — Z1211 Encounter for screening for malignant neoplasm of colon: Secondary | ICD-10-CM

## 2020-11-26 ENCOUNTER — Other Ambulatory Visit: Payer: Self-pay | Admitting: Family Medicine

## 2020-11-26 DIAGNOSIS — Z8042 Family history of malignant neoplasm of prostate: Secondary | ICD-10-CM

## 2020-12-16 ENCOUNTER — Ambulatory Visit
Admission: RE | Admit: 2020-12-16 | Discharge: 2020-12-16 | Disposition: A | Discharge status: Home / Self Care | Payer: BC Managed Care – PPO | Source: Ambulatory Visit | Attending: Family Medicine | Admitting: Family Medicine

## 2020-12-16 ENCOUNTER — Ambulatory Visit (INDEPENDENT_AMBULATORY_CARE_PROVIDER_SITE_OTHER): Payer: Self-pay | Admitting: Family Medicine

## 2020-12-16 ENCOUNTER — Encounter: Payer: Self-pay | Admitting: Family Medicine

## 2020-12-16 ENCOUNTER — Other Ambulatory Visit: Payer: Self-pay

## 2020-12-16 VITALS — BP 135/84 | HR 96 | Ht 68.0 in | Wt 302.4 lb

## 2020-12-16 DIAGNOSIS — M545 Low back pain, unspecified: Secondary | ICD-10-CM | POA: Insufficient documentation

## 2020-12-16 DIAGNOSIS — Z125 Encounter for screening for malignant neoplasm of prostate: Secondary | ICD-10-CM | POA: Insufficient documentation

## 2020-12-16 DIAGNOSIS — M25512 Pain in left shoulder: Secondary | ICD-10-CM

## 2020-12-16 DIAGNOSIS — G8929 Other chronic pain: Secondary | ICD-10-CM | POA: Insufficient documentation

## 2020-12-16 DIAGNOSIS — R7303 Prediabetes: Secondary | ICD-10-CM

## 2020-12-16 DIAGNOSIS — M2578 Osteophyte, vertebrae: Secondary | ICD-10-CM | POA: Diagnosis not present

## 2020-12-16 LAB — POCT GLYCOSYLATED HEMOGLOBIN (HGB A1C): HbA1c, POC (controlled diabetic range): 6.9 % (ref 0.0–7.0)

## 2020-12-16 MED ORDER — METHYLPREDNISOLONE ACETATE 40 MG/ML IJ SUSP
40.0000 mg | Freq: Once | INTRAMUSCULAR | Status: AC
  Administered 2020-12-16: 40 mg via INTRAMUSCULAR

## 2020-12-16 NOTE — Assessment & Plan Note (Addendum)
Congratulated on his lifestyle modifications.  He is making great strides.  Exercising most days of the week before he goes to work, focusing on cardio right now.  Portion control for eating.  A1c today 6.9.  Follow-up recheck 6 months.  Lipid panel today.

## 2020-12-16 NOTE — Addendum Note (Signed)
Addended by: Katharina Caper, Kareem Aul D on: 12/16/2020 02:03 PM   Modules accepted: Orders

## 2020-12-16 NOTE — Assessment & Plan Note (Signed)
I suspect mild to moderate DJD.  We will get lumbar spine films.  Unless I see something unexpected there, we will put him on home exercise program with hyperextension exercises.  I will let him know once I see the films.

## 2020-12-16 NOTE — Patient Instructions (Signed)
I will send you note about your labs.  We will check on your blood sugar did show that you are in the prediabetic range so I am really happy that you are working out and following a better diet.  This is the single best thing you can do.  I have put in an order for x-rays for your low back.  I suspect you have some arthritis there.  I will send you a note about the x-rays in the mail.  I will call you unless there is something unexpected on them.  If it is arthritis, I will also send you some very specific exercises for low back pain associated with arthritis.  Today I gave you a corticosteroid injection into your left shoulder.  If that is not improving your symptoms significantly by 10 to 14 days, please let me know.  It is great to see you!  Have a happy holiday season!

## 2020-12-16 NOTE — Assessment & Plan Note (Signed)
PSA screening test done today.  Discussed.

## 2020-12-16 NOTE — Progress Notes (Signed)
    CHIEF COMPLAINT / HPI: #1.Really bothering him as he tries to get back into physical fitness.  Elevation above shoulder height most problematic.  Also sometimes hurts when he is carrying heavy things.  No specific injury.  No numbness or tingling. He is right-hand dominant.  2.  Low back pain, midline.  Has been there many months but worse in the last 2-1/2 months.  No radiation into the legs, no change in bowel or bladder habits.  Pain worse with extended periods of standing or if he is doing a lot of bending over and picking stuff up.  Aching.  Sometimes feels a little burning right in the midline area.  #3.  Has started back to the gym most days of the week.  Is doing portion control for his diet.  Michela Pitcher he is released focusing right now on his health.  With some labs for that. PERTINENT  PMH / PSH: I have reviewed the patient's medications, allergies, past medical and surgical history, smoking status and updated in the EMR as appropriate.   OBJECTIVE:  BP 135/84   Pulse 96   Ht 5\' 8"  (1.727 m)   Wt (!) 302 lb 6 oz (137.2 kg)   SpO2 99%   BMI 45.98 kg/m  GENERAL: Well-developed male no acute distress BMI 45. Back: Mild lumbar musculature spasm but nontender to palpation.  The area where he points to for his pain is midline over the L3-4-5 S1 joints.  Limited flexion at the hips secondary to hamstring tightness but otherwise full range of motion.  Lower extremity strength 5 out of 5.  Normal gait.  Rises from a chair without any assistance. SHOULDER: Bilateral shoulders are symmetrical.  Left shoulder full range of motion in all planes the rotator cuff with intact strength.  Does have some pain with supraspinatus testing and abduction at 90 degrees.  PROCEDURE: INJECTION: Patient was given informed consent, signed copy in the chart. Appropriate time out was taken. Area prepped and draped in usual sterile fashion. Ethyl chloride was  used for local anesthesia. A 21 gauge 1 1/2 inch  needle was used..  1 cc of methylprednisolone 40 mg/ml plus 3 cc of 1% lidocaine without epinephrine was injected into the left subacromial bursa of the shoulder joint using a(n) posterior approach.   The patient tolerated the procedure well. There were no complications. Post procedure instructions were given.   ASSESSMENT / PLAN:   Prediabetes Congratulated on his lifestyle modifications.  He is making great strides.  Exercising most days of the week before he goes to work, focusing on cardio right now.  Portion control for eating.  A1c today 6.9.  Follow-up recheck 6 months.  Lipid panel today.  Chronic left shoulder pain Likely rotator cuff syndrome.  Will start with corticosteroid injection in the subacromial bursa and see if that gives him some relief.  Let me know in 10 to 14 days if it has not.  At that time then I would proceed with imaging which might include ultrasound.  Chronic midline low back pain without sciatica I suspect mild to moderate DJD.  We will get lumbar spine films.  Unless I see something unexpected there, we will put him on home exercise program with hyperextension exercises.  I will let him know once I see the films.  Screening for malignant neoplasm of prostate PSA screening test done today.  Discussed.   [name] MD

## 2020-12-16 NOTE — Assessment & Plan Note (Addendum)
Likely rotator cuff syndrome.  Will start with corticosteroid injection in the subacromial bursa and see if that gives him some relief.  Let me know in 10 to 14 days if it has not.  At that time then I would proceed with imaging which might include ultrasound.

## 2020-12-17 ENCOUNTER — Encounter: Payer: Self-pay | Admitting: Family Medicine

## 2020-12-17 LAB — LIPID PANEL
Chol/HDL Ratio: 5.5 ratio — ABNORMAL HIGH (ref 0.0–5.0)
Cholesterol, Total: 248 mg/dL — ABNORMAL HIGH (ref 100–199)
HDL: 45 mg/dL (ref >39)
LDL Chol Calc (NIH): 168 mg/dL — ABNORMAL HIGH (ref 0–99)
Triglycerides: 191 mg/dL — ABNORMAL HIGH (ref 0–149)
VLDL Cholesterol Cal: 35 mg/dL (ref 5–40)

## 2020-12-17 LAB — PSA: Prostate Specific Ag, Serum: 0.3 ng/mL (ref 0.0–4.0)

## 2020-12-20 ENCOUNTER — Other Ambulatory Visit: Payer: Self-pay | Admitting: Family Medicine

## 2020-12-20 MED ORDER — PENICILLIN V POTASSIUM 500 MG PO TABS: 500.0000 mg | ORAL_TABLET | Freq: Four times a day (QID) | ORAL | 0 refills | Status: DC

## 2020-12-22 ENCOUNTER — Encounter: Payer: Self-pay | Admitting: Family Medicine

## 2020-12-23 ENCOUNTER — Other Ambulatory Visit: Payer: Self-pay | Admitting: Family Medicine

## 2020-12-23 MED ORDER — ATORVASTATIN CALCIUM 40 MG PO TABS: 40.0000 mg | ORAL_TABLET | Freq: Every day | ORAL | 3 refills | Status: DC

## 2020-12-23 NOTE — Progress Notes (Signed)
Agreed to start statin

## 2020-12-24 DIAGNOSIS — G4733 Obstructive sleep apnea (adult) (pediatric): Secondary | ICD-10-CM | POA: Diagnosis not present

## 2021-01-06 ENCOUNTER — Other Ambulatory Visit: Payer: Self-pay

## 2021-01-06 ENCOUNTER — Emergency Department (HOSPITAL_BASED_OUTPATIENT_CLINIC_OR_DEPARTMENT_OTHER)
Admission: EM | Admit: 2021-01-06 | Discharge: 2021-01-06 | Disposition: A | Discharge status: Home / Self Care | Payer: BC Managed Care – PPO | Attending: Emergency Medicine | Admitting: Emergency Medicine

## 2021-01-06 ENCOUNTER — Encounter (HOSPITAL_BASED_OUTPATIENT_CLINIC_OR_DEPARTMENT_OTHER): Payer: Self-pay

## 2021-01-06 ENCOUNTER — Emergency Department (HOSPITAL_BASED_OUTPATIENT_CLINIC_OR_DEPARTMENT_OTHER): Payer: BC Managed Care – PPO

## 2021-01-06 DIAGNOSIS — R221 Localized swelling, mass and lump, neck: Secondary | ICD-10-CM | POA: Diagnosis not present

## 2021-01-06 DIAGNOSIS — M542 Cervicalgia: Secondary | ICD-10-CM | POA: Insufficient documentation

## 2021-01-06 DIAGNOSIS — J029 Acute pharyngitis, unspecified: Secondary | ICD-10-CM | POA: Insufficient documentation

## 2021-01-06 DIAGNOSIS — J3489 Other specified disorders of nose and nasal sinuses: Secondary | ICD-10-CM | POA: Insufficient documentation

## 2021-01-06 DIAGNOSIS — Z20822 Contact with and (suspected) exposure to covid-19: Secondary | ICD-10-CM | POA: Diagnosis not present

## 2021-01-06 HISTORY — DX: Pure hypercholesterolemia, unspecified: E78.00

## 2021-01-06 LAB — COMPREHENSIVE METABOLIC PANEL
ALT: 41 U/L (ref 0–44)
AST: 28 U/L (ref 15–41)
Albumin: 4.2 g/dL (ref 3.5–5.0)
Alkaline Phosphatase: 74 U/L (ref 38–126)
Anion gap: 7 (ref 5–15)
BUN: 10 mg/dL (ref 6–20)
CO2: 26 mmol/L (ref 22–32)
Calcium: 9 mg/dL (ref 8.9–10.3)
Chloride: 100 mmol/L (ref 98–111)
Creatinine, Ser: 1.18 mg/dL (ref 0.61–1.24)
GFR, Estimated: >60 mL/min (ref >60)
Glucose, Bld: 115 mg/dL — ABNORMAL HIGH (ref 70–99)
Potassium: 3.9 mmol/L (ref 3.5–5.1)
Sodium: 133 mmol/L — ABNORMAL LOW (ref 135–145)
Total Bilirubin: 0.6 mg/dL (ref 0.3–1.2)
Total Protein: 8.4 g/dL — ABNORMAL HIGH (ref 6.5–8.1)

## 2021-01-06 LAB — CBC WITH DIFFERENTIAL/PLATELET
Abs Immature Granulocytes: 0.02 10*3/uL (ref 0.00–0.07)
Basophils Absolute: 0.1 10*3/uL (ref 0.0–0.1)
Basophils Relative: 1 %
Eosinophils Absolute: 0.1 10*3/uL (ref 0.0–0.5)
Eosinophils Relative: 1 %
HCT: 45.1 % (ref 39.0–52.0)
Hemoglobin: 15.5 g/dL (ref 13.0–17.0)
Immature Granulocytes: 0 %
Lymphocytes Relative: 44 %
Lymphs Abs: 4.2 10*3/uL — ABNORMAL HIGH (ref 0.7–4.0)
MCH: 29.6 pg (ref 26.0–34.0)
MCHC: 34.4 g/dL (ref 30.0–36.0)
MCV: 86.2 fL (ref 80.0–100.0)
Monocytes Absolute: 0.5 10*3/uL (ref 0.1–1.0)
Monocytes Relative: 5 %
Neutro Abs: 4.8 10*3/uL (ref 1.7–7.7)
Neutrophils Relative %: 49 %
Platelets: 279 10*3/uL (ref 150–400)
RBC: 5.23 MIL/uL (ref 4.22–5.81)
RDW: 12.8 % (ref 11.5–15.5)
WBC: 9.6 10*3/uL (ref 4.0–10.5)
nRBC: 0 % (ref 0.0–0.2)

## 2021-01-06 LAB — GROUP A STREP BY PCR: Group A Strep by PCR: NOT DETECTED

## 2021-01-06 LAB — RESP PANEL BY RT-PCR (FLU A&B, COVID) ARPGX2
Influenza A by PCR: NEGATIVE
Influenza B by PCR: NEGATIVE
SARS Coronavirus 2 by RT PCR: NEGATIVE

## 2021-01-06 MED ORDER — IOHEXOL 300 MG/ML  SOLN
100.0000 mL | Freq: Once | INTRAMUSCULAR | Status: AC | PRN
  Administered 2021-01-06: 23:00:00 100 mL via INTRAVENOUS

## 2021-01-06 NOTE — ED Provider Notes (Signed)
[facility] EMERGENCY DEPARTMENT Provider Note   CSN: [id number] Arrival date & time: 01/06/21  1809     History Chief Complaint  Patient presents with   Lymphadenopathy    [facility] is a 45 y.o. male.  The history is provided by the patient and medical records. No language interpreter was used.  Sore Throat This is a new problem. The current episode started yesterday. The problem occurs constantly. The problem has not changed since onset.Pertinent negatives include no chest pain, no abdominal pain, no headaches and no shortness of breath. Nothing aggravates the symptoms. Nothing relieves the symptoms. He has tried nothing for the symptoms. The treatment provided no relief.      Past Medical History:  Diagnosis Date   High cholesterol    Medial meniscus tear 01/2012   right knee   Obesity    Pneumonia    Renal disorder    Vertigo, benign paroxysmal 2015    Patient Active Problem List   Diagnosis Date Noted   Chronic left shoulder pain 12/16/2020   Screening for malignant neoplasm of prostate 12/16/2020   Chronic midline low back pain without sciatica 12/16/2020   Prediabetes 12/25/2019   Internal hemorrhoids without complication 02/54/2706   Other tear of medial meniscus, current injury, left knee, subsequent encounter 05/04/2017   Pes planus 02/07/2017   Laryngopharyngeal reflux (LPR) 07/24/2015   Post corneal transplant 07/25/2014   Benign paroxysmal positional vertigo 08/30/2013   Adrenal mass, right ([place]) 05/22/2013   Sleep apnea 07/31/2009   OBESITY, NOS 03/16/2006    Past Surgical History:  Procedure Laterality Date   CORNEAL TRANSPLANT  12/2009   KNEE ARTHROSCOPY  02/20/2012   Procedure: ARTHROSCOPY KNEE;  Surgeon: Alta Corning, MD;  Location: [facility];  Service: Orthopedics;  Laterality: Right;  Chondroplasia patella femoral joint, medial meniscal debriedment       Family History  Problem Relation Age of Onset    Asthma Mother    Diabetes Father    Cancer Father        prostate   Osteoarthritis Sister    Diabetes Sister     Social History   Tobacco Use   Smoking status: Never   Smokeless tobacco: Never  Vaping Use   Vaping Use: Never used  Substance Use Topics   Alcohol use: No   Drug use: No    Home Medications Prior to Admission medications   Medication Sig Start Date End Date Taking? Authorizing Provider  atorvastatin (LIPITOR) 40 MG tablet Take 1 tablet (40 mg total) by mouth daily. 12/23/20   Dickie La, MD  omeprazole (PRILOSEC) 20 MG capsule Take 1 capsule (20 mg total) by mouth daily. 04/29/20   Dickie La, MD  penicillin v potassium (VEETID) 500 MG tablet Take 1 tablet (500 mg total) by mouth 4 (four) times daily. 12/20/20   Dickie La, MD    Allergies    Shrimp [shellfish allergy]  Review of Systems   Review of Systems  Constitutional:  Negative for chills, fatigue and fever.  HENT:  Positive for congestion, rhinorrhea, sore throat and voice change. Negative for ear pain, facial swelling and trouble swallowing.   Eyes:  Negative for visual disturbance.  Respiratory:  Negative for cough, chest tightness and shortness of breath.   Cardiovascular:  Negative for chest pain.  Gastrointestinal:  Negative for abdominal pain, constipation, diarrhea, nausea and vomiting.  Genitourinary:  Negative for flank pain.  Musculoskeletal:  Positive  for neck pain. Negative for back pain and neck stiffness.  Skin:  Negative for rash and wound.  Neurological:  Negative for light-headedness, numbness and headaches.  Psychiatric/Behavioral:  Negative for agitation and confusion.    Physical Exam Updated Vital Signs BP 124/66 (BP Location: Left Arm)    Pulse 94    Temp 98.7 F (37.1 C) (Oral)    Resp 18    Ht 5\' 8"  (1.727 m)    Wt 134.7 kg    SpO2 99%    BMI 45.16 kg/m   Physical Exam Vitals and nursing note reviewed.  Constitutional:      General: He is not in acute distress.     Appearance: He is well-developed. He is not ill-appearing, toxic-appearing or diaphoretic.  HENT:     Head: Normocephalic and atraumatic.      Nose: Rhinorrhea present. No congestion.     Mouth/Throat:     Mouth: Mucous membranes are moist.     Pharynx: No oropharyngeal exudate or posterior oropharyngeal erythema.  Eyes:     Extraocular Movements: Extraocular movements intact.     Conjunctiva/sclera: Conjunctivae normal.     Pupils: Pupils are equal, round, and reactive to light.  Cardiovascular:     Rate and Rhythm: Normal rate and regular rhythm.     Heart sounds: No murmur heard. Pulmonary:     Effort: Pulmonary effort is normal. No respiratory distress.     Breath sounds: Normal breath sounds. No wheezing, rhonchi or rales.  Chest:     Chest wall: No tenderness.  Abdominal:     Palpations: Abdomen is soft.     Tenderness: There is no abdominal tenderness. There is no right CVA tenderness, left CVA tenderness, guarding or rebound.  Musculoskeletal:        General: No swelling or tenderness.     Cervical back: Neck supple.  Skin:    General: Skin is warm and dry.     Capillary Refill: Capillary refill takes less than 2 seconds.     Findings: No erythema.  Neurological:     General: No focal deficit present.     Mental Status: He is alert.  Psychiatric:        Mood and Affect: Mood normal.    ED Results / Procedures / Treatments   Labs (all labs ordered are listed, but only abnormal results are displayed) Labs Reviewed  CBC WITH DIFFERENTIAL/PLATELET - Abnormal; Notable for the following components:      Result Value   Lymphs Abs 4.2 (*)    All other components within normal limits  COMPREHENSIVE METABOLIC PANEL - Abnormal; Notable for the following components:   Sodium 133 (*)    Glucose, Bld 115 (*)    Total Protein 8.4 (*)    All other components within normal limits  RESP PANEL BY RT-PCR (FLU A&B, COVID) ARPGX2  GROUP A STREP BY PCR  CULTURE, BLOOD (ROUTINE X  2)  CULTURE, BLOOD (ROUTINE X 2)  TSH  T3, FREE  T4, FREE    EKG None  Radiology CT Soft Tissue Neck W Contrast  Result Date: 01/06/2021 CLINICAL DATA:  Initial evaluation for acute soft tissue swelling, right side, infection suspected. EXAM: CT NECK WITH CONTRAST TECHNIQUE: Multidetector CT imaging of the neck was performed using the standard protocol following the bolus administration of intravenous contrast. CONTRAST:  164mL OMNIPAQUE IOHEXOL 300 MG/ML  SOLN COMPARISON:  None available. FINDINGS: Pharynx and larynx: Oral cavity within normal limits. No acute  inflammatory changes seen about the dentition. Palatine tonsils fairly symmetric without acute abnormality. No tonsillar or peritonsillar abscess. Oropharynx and nasopharynx within normal limits. No retropharyngeal collection or swelling. Epiglottis within normal limits. Hypopharynx and supraglottic larynx normal. Glottis is largely closed and not well assessed, but grossly within normal limits. Subglottic airway patent clear. Salivary glands: Parotid glands within normal limits. Left submandibular gland normal. Metallic BB marker overlies the right submandibular space, corresponding with the area of concern/swelling. The underlying right submandibular gland is normal in appearance without evidence for acute sialoadenitis. No abnormal ductal dilatation or solid phthisis. Thyroid: Normal. Lymph nodes: No enlarged or pathologic adenopathy seen within the neck. Vascular: Normal intravascular enhancement seen throughout the neck. Limited intracranial: Unremarkable. Visualized orbits: Unremarkable. Mastoids and visualized paranasal sinuses: Paranasal sinuses are clear. Mastoid air cells and middle ear cavities are well pneumatized and free of fluid. Skeleton: No discrete or worrisome osseous lesions. Upper chest: Visualized upper chest demonstrates no acute finding. Partially visualized lungs are grossly clear. Other: None. IMPRESSION: Negative CT of  the neck with no acute inflammatory changes identified. No discrete mass lesion, adenopathy, or other structural abnormality. Electronically Signed   By: Jeannine Boga M.D.   On: 01/06/2021 23:33    Procedures Procedures   Medications Ordered in ED Medications  iohexol (OMNIPAQUE) 300 MG/ML solution 100 mL (100 mLs Intravenous Contrast Given 01/06/21 2236)    ED Course  I have reviewed the triage vital signs and the nursing notes.  Pertinent labs & imaging results that were available during my care of the patient were reviewed by me and considered in my medical decision making (see chart for details).    MDM Rules/Calculators/A&P                           Dawayne Ohair Scott is a 45 y.o. male with a past medical history significant for vertigo, chronic back pain, and report of recent dental infection status post antibiotic completion who presents with neck pain and swelling.  According to patient, since yesterday he has developed pain and swelling to his right mid neck that is painful with movement.  He reports that his family member has had some URI symptoms recently but he denies fevers or chills.  He denies congestion, cough, runny nose but does report he had sore throat and pain on his right neck.  He says that it does hurt to twist his neck side to side and although he is able to swallow reports occasional difficulty breathing.  He thinks that his voice sounds different.  He denies history of thyroid troubles and denies any trauma.  Reports the pain is moderate he is concerned about the worsening symptoms throughout the day.  On exam, lungs are clear and chest is nontender.  Patient does have swelling in the mid lower right neck.  There is no stridor and no carotid bruit appreciated.  It is tender.  Tenderness does track up towards his right ear.  No oropharyngeal abnormality seen otherwise.  Uvula is midline.  Tonsils are symmetric but large.  No other rashes seen.  Lungs clear  and chest nontender.  Had a shared decision made conversation with patient about work-up.  I suspect he is having some lymphadenopathy related to URI exposure however given his reported difficulty breathing, voice change, and the pain and swelling developing throughout the day in his right neck, we do feel is reasonable to get imaging to make sure  he did not have a recent dental infection that has spread to deep spaces of his neck.  We will get CT scan, basic labs, and will also check for flu/COVID, and strep.  Anticipate reassessment after work-up.  CT imaging does not show any evidence of concerning findings such as Ludwick's, abscess, or acute lymphadenopathy.  Clinically he was tender on the lymph node so I do suspect he is having a reaction causing neck pain due to a likely viral exposure in the family.  COVID/flu and strep negative.  Other labs overall reassuring.  We feel patient is safe for discharge home but we do recommend him resting and following with a PCP.  We did send thyroid testing off given the tenderness on his thyroid and he will follow-up with his PCP for these results.  Patient agrees and understands return precautions.  He had no other questions or concerns and patient was discharged in good condition with reassuring vital signs  Final Clinical Impression(s) / ED Diagnoses Final diagnoses:  Sore throat  Neck pain    Rx / DC Orders ED Discharge Orders     None      Clinical Impression: 1. Sore throat   2. Neck pain     Disposition: Discharge  Condition: Good  I have discussed the results, Dx and Tx plan with the pt(& family if present). He/she/they expressed understanding and agree(s) with the plan. Discharge instructions discussed at great length. Strict return precautions discussed and pt &/or family have verbalized understanding of the instructions. No further questions at time of discharge.    New Prescriptions   No medications on file    Follow  Up: Dickie La, MD [address] [place] [id number] [facility] EMERGENCY DEPARTMENT [address] [id number] [facility] [place]       Khamani Daniely, Gwenyth Allegra, MD 01/06/21 [phone]

## 2021-01-06 NOTE — ED Triage Notes (Signed)
Pt c/o "swelling to my lymph nodes"/right side of neck x 2 days-denies sore throat/fever or flu sx-NAD-steady gait

## 2021-01-06 NOTE — ED Notes (Signed)
Discharge instructions discussed with pt. Pt verbalized understanding. Pt stable and ambulatory.  °

## 2021-01-06 NOTE — Discharge Instructions (Signed)
Your history, exam and work-up today are consistent with a viral upper respiratory infection causing your sore throat and symptoms.  The CT imaging did not show evidence of abscess or pockets of infection causing your symptoms and your labs are otherwise reassuring.  Your COVID and flu test were negative as was the strep test.  We do not feel you need any antibiotics however please follow-up with your primary doctor.  We did some thyroid test off given the tenderness over your thyroid but those will not return today.  Please follow-up with your PCP for these results.  If any symptoms are to change or worsen or get or you are having more difficulty breathing or swallowing, please return to the nearest emergency department.  Please rest and stay hydrated.

## 2021-01-07 LAB — TSH: TSH: 2.488 u[IU]/mL (ref 0.350–4.500)

## 2021-01-07 LAB — T4, FREE: Free T4: 0.95 ng/dL (ref 0.61–1.12)

## 2021-01-08 ENCOUNTER — Encounter: Payer: Self-pay | Admitting: Internal Medicine

## 2021-01-08 LAB — T3, FREE: T3, Free: 3.1 pg/mL (ref 2.0–4.4)

## 2021-01-12 ENCOUNTER — Encounter: Payer: Self-pay | Admitting: Family Medicine

## 2021-01-12 ENCOUNTER — Emergency Department (HOSPITAL_BASED_OUTPATIENT_CLINIC_OR_DEPARTMENT_OTHER)
Admission: EM | Admit: 2021-01-12 | Discharge: 2021-01-12 | Disposition: A | Discharge status: Home / Self Care | Payer: BC Managed Care – PPO | Attending: Emergency Medicine | Admitting: Emergency Medicine

## 2021-01-12 ENCOUNTER — Encounter (HOSPITAL_BASED_OUTPATIENT_CLINIC_OR_DEPARTMENT_OTHER): Payer: Self-pay | Admitting: Emergency Medicine

## 2021-01-12 ENCOUNTER — Other Ambulatory Visit: Payer: Self-pay

## 2021-01-12 DIAGNOSIS — M542 Cervicalgia: Secondary | ICD-10-CM | POA: Insufficient documentation

## 2021-01-12 LAB — CULTURE, BLOOD (ROUTINE X 2)
Culture: NO GROWTH
Culture: NO GROWTH
Special Requests: ADEQUATE

## 2021-01-12 MED ORDER — LIDOCAINE 5 % EX PTCH
1.0000 | MEDICATED_PATCH | CUTANEOUS | Status: DC
  Administered 2021-01-12: 06:00:00 1 via TRANSDERMAL
  Filled 2021-01-12: qty 1

## 2021-01-12 MED ORDER — LIDOCAINE 5 % EX OINT: 1.0000 "application " | TOPICAL_OINTMENT | Freq: Four times a day (QID) | CUTANEOUS | 0 refills | Status: DC | PRN

## 2021-01-12 MED ORDER — IBUPROFEN 800 MG PO TABS: 800.0000 mg | ORAL_TABLET | Freq: Three times a day (TID) | ORAL | 0 refills | Status: DC | PRN

## 2021-01-12 MED ORDER — CYCLOBENZAPRINE HCL 10 MG PO TABS: 10.0000 mg | ORAL_TABLET | Freq: Every evening | ORAL | 0 refills | Status: DC | PRN

## 2021-01-12 NOTE — ED Triage Notes (Signed)
Pt c/o right sided neck pain with sore throat. Pt was seen here 12/21 and reports no improvement in symptoms

## 2021-01-12 NOTE — ED Notes (Signed)
Discharge instructions discussed with pt. Pt verbalized understanding. Pt stable and ambulatory.  °

## 2021-01-12 NOTE — ED Provider Notes (Signed)
[facility] Provider Note   CSN: [id number] Arrival date & time: 01/12/21  2229     History Chief Complaint  Patient presents with   Neck Pain    Derrick Scott is a 46 y.o. male.  The history is provided by the patient and medical records.  Neck Pain Derrick Scott is a 45 y.o. male who presents to the Emergency [facility] complaining of neck pain.  He presents to the emergency [facility] complaining of 1 week of right-sided neck pain.  He was evaluated in the emergency [facility] the day after his pain started and had an extensive work-up at that time.  He has been treating his pain with ibuprofen, 400 mg as needed with no significant improvement.  Pain is located on the lateral right neck.  It starts behind the ear and is worst over his mandible inferiorly.  Pain is worse with lateral rotation to the left as well as palpation.  He can feel it if he coughs or swallows but does not have throat pain.  No fevers, nausea, vomiting.  He does feel it when he breathes but he is not short of breath.  No prior similar symptoms.  No reports of injuries.    Past Medical History:  Diagnosis Date   High cholesterol    Medial meniscus tear 01/2012   right knee   Obesity    Pneumonia    Renal disorder    Vertigo, benign paroxysmal 2015    Patient Active Problem List   Diagnosis Date Noted   Chronic left shoulder pain 12/16/2020   Screening for malignant neoplasm of prostate 12/16/2020   Chronic midline low back pain without sciatica 12/16/2020   Prediabetes 12/25/2019   Internal hemorrhoids without complication 79/89/2119   Other tear of medial meniscus, current injury, left knee, subsequent encounter 05/04/2017   Pes planus 02/07/2017   Laryngopharyngeal reflux (LPR) 07/24/2015   Post corneal transplant 07/25/2014   Benign paroxysmal positional vertigo 08/30/2013   Adrenal mass, right ([place]) 05/22/2013   Sleep apnea 07/31/2009   OBESITY,  NOS 03/16/2006    Past Surgical History:  Procedure Laterality Date   CORNEAL TRANSPLANT  12/2009   KNEE ARTHROSCOPY  02/20/2012   Procedure: ARTHROSCOPY KNEE;  Surgeon: Alta Corning, MD;  Location: [facility];  Service: Orthopedics;  Laterality: Right;  Chondroplasia patella femoral joint, medial meniscal debriedment       Family History  Problem Relation Age of Onset   Asthma Mother    Diabetes Father    Cancer Father        prostate   Osteoarthritis Sister    Diabetes Sister     Social History   Tobacco Use   Smoking status: Never   Smokeless tobacco: Never  Vaping Use   Vaping Use: Never used  Substance Use Topics   Alcohol use: No   Drug use: No    Home Medications Prior to Admission medications   Medication Sig Start Date End Date Taking? Authorizing Provider  cyclobenzaprine (FLEXERIL) 10 MG tablet Take 1 tablet (10 mg total) by mouth at bedtime as needed for muscle spasms. 01/12/21  Yes Quintella Reichert, MD  ibuprofen (ADVIL) 800 MG tablet Take 1 tablet (800 mg total) by mouth every 8 (eight) hours as needed for moderate pain. 01/12/21  Yes Quintella Reichert, MD  lidocaine (XYLOCAINE) 5 % ointment Apply 1 application topically 4 (four) times daily as needed. 01/12/21  Yes Quintella Reichert, MD  atorvastatin (LIPITOR) 40 MG tablet Take 1 tablet (40 mg total) by mouth daily. 12/23/20   Dickie La, MD  omeprazole (PRILOSEC) 20 MG capsule Take 1 capsule (20 mg total) by mouth daily. 04/29/20   Dickie La, MD  penicillin v potassium (VEETID) 500 MG tablet Take 1 tablet (500 mg total) by mouth 4 (four) times daily. 12/20/20   Dickie La, MD    Allergies    Shrimp [shellfish allergy]  Review of Systems   Review of Systems  Musculoskeletal:  Positive for neck pain.  All other systems reviewed and are negative.  Physical Exam Updated Vital Signs BP 118/69    Pulse 91    Temp 98.8 F (37.1 C) (Oral)    Resp 18    Ht 5\' 8"  (1.727 m)    Wt 134.7 kg     SpO2 100%    BMI 45.15 kg/m   Physical Exam Vitals and nursing note reviewed.  Constitutional:      Appearance: He is well-developed.  HENT:     Head: Normocephalic and atraumatic.     Comments: No Mastoid tenderness or fullness    Right Ear: Tympanic membrane normal.     Left Ear: Tympanic membrane normal.     Nose: Nose normal.     Mouth/Throat:     Mouth: Mucous membranes are moist.     Pharynx: No oropharyngeal exudate or posterior oropharyngeal erythema.  Neck:     Comments: Mild tenderness to palpation over the right submandibular region. Cardiovascular:     Rate and Rhythm: Normal rate and regular rhythm.     Heart sounds: No murmur heard. Pulmonary:     Effort: Pulmonary effort is normal. No respiratory distress.     Breath sounds: Normal breath sounds.  Abdominal:     Palpations: Abdomen is soft.     Tenderness: There is no abdominal tenderness. There is no guarding or rebound.  Musculoskeletal:        General: No tenderness.     Cervical back: Neck supple. No rigidity.  Lymphadenopathy:     Cervical: No cervical adenopathy.  Skin:    General: Skin is warm and dry.  Neurological:     Mental Status: He is alert and oriented to person, place, and time.  Psychiatric:        Behavior: Behavior normal.    ED Results / Procedures / Treatments   Labs (all labs ordered are listed, but only abnormal results are displayed) Labs Reviewed - No data to display  EKG None  Radiology No results found.  Procedures Procedures   Medications Ordered in ED Medications  lidocaine (LIDODERM) 5 % 1 patch (has no administration in time range)    ED Course  I have reviewed the triage vital signs and the nursing notes.  Pertinent labs & imaging results that were available during my care of the patient were reviewed by me and considered in my medical decision making (see chart for details).    MDM Rules/Calculators/A&P                         Patient here for  evaluation of right lateral neck pain, there is 1 discrete location that is predominantly tender.  On examination he is nontoxic-appearing with no respiratory distress.  There are no focal masses palpated.  No overlying skin changes.  He had an extensive work-up on recent ED visit with negative CT soft tissue neck,  reassuring labs.  Presentation is not consistent with serious bacterial infection, mass.  Given reproducibility of pain with range of motion, palpation suspect that this is musculoskeletal in nature.  Will treat with topical analgesia, continue ibuprofen at a higher dose as well as muscle relaxer as needed.  Discussed PCP follow-up.    Final Clinical Impression(s) / ED Diagnoses Final diagnoses:  Neck pain    Rx / DC Orders ED Discharge Orders          Ordered    cyclobenzaprine (FLEXERIL) 10 MG tablet  At bedtime PRN        01/12/21 0610    ibuprofen (ADVIL) 800 MG tablet  Every 8 hours PRN        01/12/21 0617    lidocaine (XYLOCAINE) 5 % ointment  4 times daily PRN        01/12/21 0617             Quintella Reichert, MD 01/12/21 [phone]

## 2021-01-14 ENCOUNTER — Ambulatory Visit: Payer: BC Managed Care – PPO | Admitting: Family Medicine

## 2021-01-24 DIAGNOSIS — G4733 Obstructive sleep apnea (adult) (pediatric): Secondary | ICD-10-CM | POA: Diagnosis not present

## 2021-01-27 ENCOUNTER — Other Ambulatory Visit: Payer: Self-pay

## 2021-01-27 ENCOUNTER — Encounter: Payer: Self-pay | Admitting: Family Medicine

## 2021-01-27 ENCOUNTER — Ambulatory Visit (INDEPENDENT_AMBULATORY_CARE_PROVIDER_SITE_OTHER): Payer: BC Managed Care – PPO | Admitting: Family Medicine

## 2021-01-27 VITALS — BP 128/86 | HR 93 | Ht 68.0 in | Wt 296.0 lb

## 2021-01-27 DIAGNOSIS — Z6841 Body Mass Index (BMI) 40.0 and over, adult: Secondary | ICD-10-CM | POA: Diagnosis not present

## 2021-01-27 DIAGNOSIS — R7303 Prediabetes: Secondary | ICD-10-CM | POA: Diagnosis not present

## 2021-01-27 DIAGNOSIS — M542 Cervicalgia: Secondary | ICD-10-CM

## 2021-01-27 MED ORDER — SILDENAFIL CITRATE 25 MG PO TABS: ORAL_TABLET | ORAL | 1 refills | Status: DC

## 2021-01-27 NOTE — Patient Instructions (Signed)
Let's try the new med for the bedroom. Let me know if it works and if you have headaches as there are other option.  I am PROUD OF YOU for your continued weight loss and focus on your health. Let me see you back in 2-3 months.

## 2021-01-28 NOTE — Progress Notes (Signed)
° ° °  CHIEF COMPLAINT / HPI: #1.  Follow-up right-sided neck pain.  It was acute in onset and persisted despite high-dose NSAIDs.  Was seen at emergency department and had CT of neck.  Now has an appointment with his dentist for some fairly significant issues involving a cyst on both sides of his lower set of teeth and his wisdom teeth.  We will likely have to have surgical intervention.  No pain currently.  No problem swallowing. 2.  Continues with his lifestyle modifications for weight loss.  Being ill with the neck issues described above 3 man a little bit off his game but he plans to get back on track soon.   PERTINENT  PMH / PSH: I have reviewed the patients medications, allergies, past medical and surgical history, smoking status and updated in the EMR as appropriate.   OBJECTIVE:  BP 128/86    Pulse 93    Ht 5\' 8"  (1.727 m)    Wt 296 lb (134.3 kg)    SpO2 99%    BMI 45.01 kg/m  HEENT: no LAD, Supple. No JVD. Nontender.  ASSESSMENT / PLAN: Neck pain: I suspect this may have been related to a dental issue and it sounds like he has follow-up set up for that.  Let me know if he has problems before he sees the dentist 2.  Obesity: Encouraged him to get back on his exercise and eating plan as he was doing so well.  I will follow him up in 4 to 6 weeks for this.  No problem-specific Assessment & Plan notes found for this encounter.   [name] MD

## 2021-02-01 ENCOUNTER — Telehealth: Payer: Self-pay

## 2021-02-01 NOTE — Telephone Encounter (Signed)
[employer] calls nurse line to give update on CPAP supply approval. Approval date starting 02/01/2021-01/31/2022. Supplies will be delivered through [facility].

## 2021-02-09 ENCOUNTER — Other Ambulatory Visit: Payer: Self-pay

## 2021-02-09 ENCOUNTER — Ambulatory Visit (AMBULATORY_SURGERY_CENTER): Payer: BC Managed Care – PPO | Admitting: *Deleted

## 2021-02-09 VITALS — Ht 68.0 in | Wt 292.0 lb

## 2021-02-09 DIAGNOSIS — Z1211 Encounter for screening for malignant neoplasm of colon: Secondary | ICD-10-CM

## 2021-02-09 MED ORDER — NA SULFATE-K SULFATE-MG SULF 17.5-3.13-1.6 GM/177ML PO SOLN: 1.0000 | Freq: Once | ORAL | 0 refills | Status: AC

## 2021-02-09 NOTE — Progress Notes (Signed)

## 2021-02-12 ENCOUNTER — Other Ambulatory Visit (HOSPITAL_COMMUNITY): Payer: Self-pay

## 2021-02-17 ENCOUNTER — Telehealth: Payer: Self-pay

## 2021-02-17 ENCOUNTER — Other Ambulatory Visit: Payer: Self-pay

## 2021-02-17 ENCOUNTER — Ambulatory Visit (INDEPENDENT_AMBULATORY_CARE_PROVIDER_SITE_OTHER): Payer: BC Managed Care – PPO | Admitting: Family Medicine

## 2021-02-17 VITALS — BP 110/66 | HR 72 | Ht 68.0 in | Wt 295.6 lb

## 2021-02-17 DIAGNOSIS — H8110 Benign paroxysmal vertigo, unspecified ear: Secondary | ICD-10-CM | POA: Diagnosis not present

## 2021-02-17 MED ORDER — MECLIZINE HCL 25 MG PO TABS: 25.0000 mg | ORAL_TABLET | Freq: Three times a day (TID) | ORAL | 0 refills | Status: DC | PRN

## 2021-02-17 MED ORDER — ONDANSETRON 4 MG PO TBDP: 4.0000 mg | ORAL_TABLET | Freq: Three times a day (TID) | ORAL | 0 refills | Status: DC | PRN

## 2021-02-17 NOTE — Assessment & Plan Note (Signed)
Patient with 4 days of intermittent room-spinning sensation that is positional in nature. Presentation consistent with BPPV. Known hx of the same. No concern for central cause of vertigo, no signs of Meniere's or vestibular neuritis. -Given refills on Meclizine 25mg  TID prn -Rx sent for Zofran ODT to use as needed for nausea -Counseled on benign nature of disease, reassurance provided -Patient given work note

## 2021-02-17 NOTE — Patient Instructions (Signed)
It was great to meet you!  I'm sorry you're suffering from an episode of vertigo. I have sent refills on your Meclizine which you take take up to 3 times daily as needed. I have also sent a few tablets of Zofran to help with the nausea. Hopefully this will subside in the coming days.   Take care and seek immediate care sooner if you develop any concerns.  Dr[facility]  Benign Positional Vertigo Vertigo is the feeling that you or your surroundings are moving when they are not. Benign positional vertigo is the most common form of vertigo. This is usually a harmless condition (benign). This condition is positional. This means that symptoms are triggered by certain movements and positions. This condition can be dangerous if it occurs while you are doing something that could cause harm to yourself or others. This includes activities such as driving or operating machinery. What are the causes? The inner ear has fluid-filled canals that help your brain sense movement and balance. When the fluid moves, the brain receives messages about your body's position. With benign positional vertigo, calcium crystals in the inner ear break free and disturb the inner ear area. This causes your brain to receive confusing messages about your body's position. What increases the risk? You are more likely to develop this condition if: You are a woman. You are 70 years of age or older. You have recently had a head injury. You have an inner ear disease. What are the signs or symptoms? Symptoms of this condition usually happen when you move your head or your eyes in different directions. Symptoms may start suddenly and usually last for less than a minute. They include: Loss of balance and falling. Feeling like you are spinning or moving. Feeling like your surroundings are spinning or moving. Nausea and vomiting. Blurred vision. Dizziness. Involuntary eye movement (nystagmus). Symptoms can be mild  and cause only minor problems, or they can be severe and interfere with daily life. Episodes of benign positional vertigo may return (recur) over time. Symptoms may also improve over time. How is this diagnosed? This condition may be diagnosed based on: Your medical history. A physical exam of the head, neck, and ears. Positional tests to check for or stimulate vertigo. You may be asked to turn your head and change positions, such as going from sitting to lying down. A health care provider will watch for symptoms of vertigo. You may be referred to a health care provider who specializes in ear, nose, and throat problems (ENT or otolaryngologist) or a provider who specializes in disorders of the nervous system (neurologist). How is this treated? This condition may be treated in a session in which your health care provider moves your head in specific positions to help the displaced crystals in your inner ear move. Treatment for this condition may take several sessions. Surgery may be needed in severe cases, but this is rare. In some cases, benign positional vertigo may resolve on its own in 2-4 weeks. Follow these instructions at home: Safety Move slowly. Avoid sudden body or head movements or certain positions, as told by your health care provider. Avoid driving or operating machinery until your health care provider says it is safe. Avoid doing any tasks that would be dangerous to you or others if vertigo occurs. If you have trouble walking or keeping your balance, try using a cane for stability. If you feel dizzy or unstable, sit down right away. Return to your normal activities  as told by your health care provider. Ask your health care provider what activities are safe for you. General instructions Take over-the-counter and prescription medicines only as told by your health care provider. Drink enough fluid to keep your urine pale yellow. Keep all follow-up visits. This is important. Contact a  health care provider if: You have a fever. Your condition gets worse or you develop new symptoms. Your family or friends notice any behavioral changes. You have nausea or vomiting that gets worse. You have numbness or a prickling and tingling sensation. Get help right away if you: Have difficulty speaking or moving. Are always dizzy or faint. Develop severe headaches. Have weakness in your legs or arms. Have changes in your hearing or vision. Develop a stiff neck. Develop sensitivity to light. These symptoms may represent a serious problem that is an emergency. Do not wait to see if the symptoms will go away. Get medical help right away. Call your local emergency services (911 in the [place] Do not drive yourself to the hospital. Summary Vertigo is the feeling that you or your surroundings are moving when they are not. Benign positional vertigo is the most common form of vertigo. This condition is caused by calcium crystals in the inner ear that become displaced. This causes a disturbance in an area of the inner ear that helps your brain sense movement and balance. Symptoms include loss of balance and falling, feeling that you or your surroundings are moving, nausea and vomiting, and blurred vision. This condition can be diagnosed based on symptoms, a physical exam, and positional tests. Follow safety instructions as told by your health care provider and keep all follow-up visits. This is important. This information is not intended to replace advice given to you by your health care provider. Make sure you discuss any questions you have with your health care provider. Document Revised: 12/04/2019 Document Reviewed: 12/04/2019 Elsevier Patient Education  2022 [employer].

## 2021-02-17 NOTE — Telephone Encounter (Signed)
Patient calls nurse line requesting an extension on work note. Patient reports that he has a colonoscopy scheduled for the 7th and was wanting to see if provider could extend note for him to return to work on 02/24/21.  Patient states that he will also be getting a note from GI specialist, however is asking to be excused for Monday, 2/6 as well.   Please advise.   Talbot Grumbling, RN

## 2021-02-17 NOTE — Telephone Encounter (Signed)
°  Work note extended through 2/8 per patient request. Communication sent via [place]. Patient should be able to print note via MyChart.

## 2021-02-17 NOTE — Progress Notes (Signed)
° ° °  SUBJECTIVE:   CHIEF COMPLAINT / HPI:   Dizziness -Symptoms started 4 days ago -Intermittent -Described as room spinning sensation -Exacerbated by turning his head to the right -Also worse with position changes -Endorses associated nausea -Checked his sugar at home and it was 120. Also checked blood pressure which was 125/94 and HR 107. -Patient has a hx of BPPV. Reports this feels the same as past episodes -Had some Meclizine at home- took twice yesterday and once the day before with slight improvement. -No recent illness, no hearing loss or tinnitus, no focal weakness -Patient wondering if he could have Meniere's disease  PERTINENT  PMH / PSH: BPPV, obesity, prediabetes, sleep apnea  OBJECTIVE:   BP 110/66    Pulse 72    Ht 5\' 8"  (1.727 m)    Wt 295 lb 9.6 oz (134.1 kg)    SpO2 98%    BMI 44.95 kg/m   Gen: alert, well-appearing, NAD HEENT: EOMI, no nystagmus Resp: normal work of breathing Neuro: CN II-XII intact, 5/5 strength in all extremities, sensation intact to light touch in all extremities, normal gait Psych: appropriate mood and affect  ASSESSMENT/PLAN:   Benign paroxysmal positional vertigo Patient with 4 days of intermittent room-spinning sensation that is positional in nature. Presentation consistent with BPPV. Known hx of the same. No concern for central cause of vertigo, no signs of Meniere's or vestibular neuritis. -Given refills on Meclizine 25mg  TID prn -Rx sent for Zofran ODT to use as needed for nausea -Counseled on benign nature of disease, reassurance provided -Patient given work note    Alcus Dad, MD [place]

## 2021-02-18 ENCOUNTER — Encounter: Payer: Self-pay | Admitting: Internal Medicine

## 2021-02-23 ENCOUNTER — Encounter: Payer: Self-pay | Admitting: Internal Medicine

## 2021-02-23 ENCOUNTER — Ambulatory Visit (AMBULATORY_SURGERY_CENTER): Payer: BC Managed Care – PPO | Admitting: Internal Medicine

## 2021-02-23 ENCOUNTER — Other Ambulatory Visit: Payer: Self-pay

## 2021-02-23 VITALS — BP 134/98 | HR 97 | Temp 97.0°F | Resp 13 | Ht 68.0 in | Wt 292.0 lb

## 2021-02-23 DIAGNOSIS — D124 Benign neoplasm of descending colon: Secondary | ICD-10-CM

## 2021-02-23 DIAGNOSIS — Z1211 Encounter for screening for malignant neoplasm of colon: Secondary | ICD-10-CM

## 2021-02-23 DIAGNOSIS — D122 Benign neoplasm of ascending colon: Secondary | ICD-10-CM | POA: Diagnosis not present

## 2021-02-23 MED ORDER — SODIUM CHLORIDE 0.9 % IV SOLN: 500.0000 mL | Freq: Once | INTRAVENOUS | Status: DC

## 2021-02-23 NOTE — Patient Instructions (Signed)
Handout on polyps and diverticulosis provided  Await pathology results.  YOU HAD AN ENDOSCOPIC PROCEDURE TODAY AT [facility]:   Refer to the procedure report that was given to you for any specific questions about what was found during the examination.  If the procedure report does not answer your questions, please call your gastroenterologist to clarify.  If you requested that your care partner not be given the details of your procedure findings, then the procedure report has been included in a sealed envelope for you to review at your convenience later.  YOU SHOULD EXPECT: Some feelings of bloating in the abdomen. Passage of more gas than usual.  Walking can help get rid of the air that was put into your GI tract during the procedure and reduce the bloating. If you had a lower endoscopy (such as a colonoscopy or flexible sigmoidoscopy) you may notice spotting of blood in your stool or on the toilet paper. If you underwent a bowel prep for your procedure, you may not have a normal bowel movement for a few days.  Please Note:  You might notice some irritation and congestion in your nose or some drainage.  This is from the oxygen used during your procedure.  There is no need for concern and it should clear up in a day or so.  SYMPTOMS TO REPORT IMMEDIATELY:  Following lower endoscopy (colonoscopy or flexible sigmoidoscopy):  Excessive amounts of blood in the stool  Significant tenderness or worsening of abdominal pains  Swelling of the abdomen that is new, acute  Fever of 100F or higher  For urgent or emergent issues, a gastroenterologist can be reached at any hour by calling [phone]. Do not use MyChart messaging for urgent concerns.    DIET:  We do recommend a small meal at first, but then you may proceed to your regular diet.  Drink plenty of fluids but you should avoid alcoholic beverages for 24 hours.  ACTIVITY:  You should plan to take it easy for the rest of  today and you should NOT DRIVE or use heavy machinery until tomorrow (because of the sedation medicines used during the test).    FOLLOW UP: Our staff will call the number listed on your records 48-72 hours following your procedure to check on you and address any questions or concerns that you may have regarding the information given to you following your procedure. If we do not reach you, we will leave a message.  We will attempt to reach you two times.  During this call, we will ask if you have developed any symptoms of COVID 19. If you develop any symptoms (ie: fever, flu-like symptoms, shortness of breath, cough etc.) before then, please call [phone].  If you test positive for Covid 19 in the 2 weeks post procedure, please call and report this information to [place].    If any biopsies were taken you will be contacted by phone or by letter within the next 1-3 weeks.  Please call us at [phone] if you have not heard about the biopsies in 3 weeks.    SIGNATURES/CONFIDENTIALITY: You and/or your care partner have signed paperwork which will be entered into your electronic medical record.  These signatures attest to the fact that that the information above on your After Visit Summary has been reviewed and is understood.  Full responsibility of the confidentiality of this discharge information lies with you and/or your care-partner.

## 2021-02-23 NOTE — Op Note (Signed)
[facility] Patient Name: Derrick Scott Procedure Date: 02/23/2021 8:39 AM MRN: [id number] Endoscopist: Docia Chuck. Henrene Pastor , MD Age: 46 Referring MD:  Date of Birth: 1975/08/06 Gender: Male Account #: [id number] Procedure:                Colonoscopy with cold snare polypectomy x 2 Indications:              Screening for colorectal malignant neoplasm Medicines:                Monitored Anesthesia Care Procedure:                Pre-Anesthesia Assessment:                           - Prior to the procedure, a History and Physical                            was performed, and patient medications and                            allergies were reviewed. The patient's tolerance of                            previous anesthesia was also reviewed. The risks                            and benefits of the procedure and the sedation                            options and risks were discussed with the patient.                            All questions were answered, and informed consent                            was obtained. Prior Anticoagulants: The patient has                            taken no previous anticoagulant or antiplatelet                            agents. ASA Grade Assessment: II - A patient with                            mild systemic disease. After reviewing the risks                            and benefits, the patient was deemed in                            satisfactory condition to undergo the procedure.                           After obtaining informed consent, the colonoscope  was passed under direct vision. Throughout the                            procedure, the patient's blood pressure, pulse, and                            oxygen saturations were monitored continuously. The                            Olympus CF-HQ190L [id number]) Colonoscope was                            introduced through the anus and advanced to the the                             cecum, identified by appendiceal orifice and                            ileocecal valve. The ileocecal valve, appendiceal                            orifice, and rectum were photographed. The quality                            of the bowel preparation was excellent. The                            colonoscopy was performed without difficulty. The                            patient tolerated the procedure well. The bowel                            preparation used was SUPREP via split dose                            instruction. Scope In: 8:47:08 AM Scope Out: 9:02:26 AM Scope Withdrawal Time: 0 hours 12 minutes 46 seconds  Total Procedure Duration: 0 hours 15 minutes 18 seconds  Findings:                 Two polyps were found in the descending colon and                            ascending colon. The polyps were 1 to 3 mm in size.                            These polyps were removed with a cold snare.                            Resection and retrieval were complete.                           Scattered diverticula were found in the left colon  and right colon.                           The exam was otherwise without abnormality on                            direct and retroflexion views. Complications:            No immediate complications. Estimated blood loss:                            None. Estimated Blood Loss:     Estimated blood loss: none. Impression:               - Two 1 to 3 mm polyps in the descending colon and                            in the ascending colon, removed with a cold snare.                            Resected and retrieved.                           - Diverticulosis in the left colon and in the right                            colon.                           - The examination was otherwise normal on direct                            and retroflexion views. Recommendation:           - Repeat colonoscopy in 7-10 years for  surveillance.                           - Patient has a contact number available for                            emergencies. The signs and symptoms of potential                            delayed complications were discussed with the                            patient. Return to normal activities tomorrow.                            Written discharge instructions were provided to the                            patient.                           - Resume previous diet.                           -  Continue present medications.                           - Await pathology results. Docia Chuck. Henrene Pastor, MD 02/23/2021 9:10:34 AM This report has been signed electronically.

## 2021-02-23 NOTE — Progress Notes (Signed)
VS-SM  Pt's states no medical or surgical changes since previsit or office visit.  

## 2021-02-23 NOTE — Progress Notes (Signed)
HISTORY OF PRESENT ILLNESS:  Derrick Scott is a 46 y.o. male who presents today for routine screening colonoscopy.  No active complaints.  REVIEW OF SYSTEMS:  All non-GI ROS negative.  Past Medical History:  Diagnosis Date   Arthritis    knee bilateral   Asthma    as a child "NO attacks"   Diabetes mellitus without complication ([place])    border line no medicationa   GERD (gastroesophageal reflux disease)    High cholesterol    Medial meniscus tear 01/2012   right knee   Obesity    Pneumonia    Renal disorder    hematuria,kidney stones   Sleep apnea    Vertigo, benign paroxysmal 2015    Past Surgical History:  Procedure Laterality Date   CORNEAL TRANSPLANT  12/2009   KNEE ARTHROSCOPY  02/20/2012   Procedure: ARTHROSCOPY KNEE;  Surgeon: Alta Corning, MD;  Location: [facility];  Service: Orthopedics;  Laterality: Right;  Chondroplasia patella femoral joint, medial meniscal debriedment    Social History Derrick Scott  reports that he has never smoked. He has never used smokeless tobacco. He reports that he does not drink alcohol and does not use drugs.  family history includes Asthma in his mother; Cancer in his father; Diabetes in his father and sister; Osteoarthritis in his sister.  Allergies  Allergen Reactions   Shrimp [Shellfish Allergy] Anaphylaxis       PHYSICAL EXAMINATION:  Vital signs: BP 119/78    Pulse (!) 101    Temp (!) 97 F (36.1 C) (Temporal)    Resp 11    Ht 5\' 8"  (1.727 m)    Wt 292 lb (132.5 kg)    SpO2 100%    BMI 44.40 kg/m  General: Well-developed, well-nourished, no acute distress HEENT: Sclerae are anicteric, conjunctiva pink. Oral mucosa intact Lungs: Clear Heart: Regular Abdomen: soft, nontender, nondistended, no obvious ascites, no peritoneal signs, normal bowel sounds. No organomegaly. Extremities: No edema Psychiatric: alert and oriented x3. Cooperative     ASSESSMENT:  1.  Colon cancer  screening   PLAN:  Screening colonoscopy

## 2021-02-23 NOTE — Progress Notes (Signed)
To Pacu, VSS. Report to [email or web address] 

## 2021-02-24 DIAGNOSIS — G4733 Obstructive sleep apnea (adult) (pediatric): Secondary | ICD-10-CM | POA: Diagnosis not present

## 2021-02-25 ENCOUNTER — Encounter: Payer: Self-pay | Admitting: Internal Medicine

## 2021-02-25 ENCOUNTER — Telehealth: Payer: Self-pay | Admitting: *Deleted

## 2021-02-25 ENCOUNTER — Telehealth: Payer: Self-pay

## 2021-02-25 NOTE — Telephone Encounter (Signed)
°  Follow up Call-  Call back number 02/23/2021  Post procedure Call Back phone  # [phone]  Permission to leave phone message Yes  Some recent data might be hidden     NO ANSWER, MESSAGE LEFT FOR PATIENT.

## 2021-02-25 NOTE — Telephone Encounter (Signed)
°  Follow up Call-  Call back number 02/23/2021  Post procedure Call Back phone  # [phone]  Permission to leave phone message Yes  Some recent data might be hidden     First attempt for follow up phone call. No answer at number given.  Left message on voicemail.

## 2021-03-03 ENCOUNTER — Telehealth: Payer: Self-pay | Admitting: *Deleted

## 2021-03-03 ENCOUNTER — Other Ambulatory Visit: Payer: Self-pay | Admitting: Family Medicine

## 2021-03-03 DIAGNOSIS — R42 Dizziness and giddiness: Secondary | ICD-10-CM

## 2021-03-03 NOTE — Telephone Encounter (Signed)
Patient is wanting to see [facility] for his vertigo.  He said that it is not working and he wants to be referred to a specialist.  Will send to MD to place referral.  Johnney Ou

## 2021-03-04 ENCOUNTER — Other Ambulatory Visit: Payer: Self-pay

## 2021-03-04 ENCOUNTER — Ambulatory Visit (INDEPENDENT_AMBULATORY_CARE_PROVIDER_SITE_OTHER): Payer: BC Managed Care – PPO | Admitting: Family Medicine

## 2021-03-04 ENCOUNTER — Encounter: Payer: Self-pay | Admitting: Family Medicine

## 2021-03-04 DIAGNOSIS — R42 Dizziness and giddiness: Secondary | ICD-10-CM

## 2021-03-04 DIAGNOSIS — J310 Chronic rhinitis: Secondary | ICD-10-CM

## 2021-03-04 MED ORDER — CLONAZEPAM 0.5 MG PO TABS
0.5000 mg | ORAL_TABLET | Freq: Two times a day (BID) | ORAL | 1 refills | Status: DC
Start: 2021-03-04 — End: 2021-03-18

## 2021-03-04 MED ORDER — FLUTICASONE PROPIONATE 50 MCG/ACT NA SUSP: 2.0000 | Freq: Every day | NASAL | 6 refills | Status: DC

## 2021-03-04 NOTE — Patient Instructions (Signed)
For your vertigo, I am thinking of two main causes. BPPV.  I have referred you to Neuro rehab for otolith repositioning.   Meniere's disease: We use different meds to treat.  I sent in a prescription for the medicine to treat Meniere's.  Pay attention to whether it works or not.   For the congestion, be careful with afrin, never use more than three days.  Flonase is safer and you can use long term.  It is slower to act.  Get on and stay on flonase.   Say hi to your mom for me.

## 2021-03-04 NOTE — Assessment & Plan Note (Addendum)
Recurrent.  My differential includes both BPPV and Meniere's disease.  Will get neuro rehab to try otolith repositioning maneuver.  Trial of clonazepam for possibility of Meniere's  He intends to see neuro for this problem.  Referral placed by PCP already.

## 2021-03-04 NOTE — Assessment & Plan Note (Signed)
Chronic.  Switch from afrin to flonase.  Likely not related to vertigo

## 2021-03-04 NOTE — Progress Notes (Signed)
° ° °  SUBJECTIVE:   CHIEF COMPLAINT / HPI:   Vertigo began 36 hours ago.  He has recurrent episodes of vertigo dating back to 2016 or 17.  Having about 10 episodes per year.  Each episode lasts 3 days on average.  He has multiple brief spells of vertigo during each episode.  Previously treated with meclizine.  He does note that rapid head movement may bring on vertigo.  He has been diagnosed as BPPV but never had attempt at otolith repositioning.   No hearing lose.  No URI symptoms.  Does have chronic nasal congestion.  Using afrin at this time.    OBJECTIVE:   BP 134/82    Pulse 82    Wt 287 lb 9.6 oz (130.5 kg)    SpO2 97%    BMI 43.73 kg/m   TMs normal Throat normal Eyes, 2 beats of horizontal nystagmus on lateral gaze  ASSESSMENT/PLAN:   Rhinitis Chronic.  Switch from afrin to flonase.  Likely not related to vertigo  Vertigo Recurrent.  My differential includes both BPPV and Meniere's disease.  Will get neuro rehab to try otolith repositioning maneuver.  Trial of clonazepam for possibility of Meniere's  He intends to see neuro for this problem.  Referral placed by PCP already.     Zenia Resides, MD [place]

## 2021-03-05 ENCOUNTER — Ambulatory Visit: Payer: BC Managed Care – PPO | Attending: Family Medicine

## 2021-03-05 DIAGNOSIS — R2681 Unsteadiness on feet: Secondary | ICD-10-CM | POA: Insufficient documentation

## 2021-03-05 DIAGNOSIS — R42 Dizziness and giddiness: Secondary | ICD-10-CM | POA: Insufficient documentation

## 2021-03-05 NOTE — Therapy (Signed)
[facility] [address] [place], [place], [address] Phone: [phone]   Fax:  [phone]  Physical Therapy Evaluation  Patient Details  Name: Derrick Scott MRN: [id number] Date of Birth: 15-Oct-1975 Referring Provider (PT): Zenia Resides, MD   Encounter Date: 03/05/2021   PT End of Session - 03/05/21 1100     Visit Number 1    Number of Visits 5    Date for PT Re-Evaluation 04/09/21    Authorization Type BCBS    PT Start Time 1100    PT Stop Time 1139    PT Time Calculation (min) 39 min    Activity Tolerance Patient tolerated treatment well    Behavior During Therapy [facility] for tasks assessed/performed             Past Medical History:  Diagnosis Date   Arthritis    knee bilateral   Asthma    as a child "NO attacks"   Diabetes mellitus without complication ([place])    border line no medicationa   GERD (gastroesophageal reflux disease)    High cholesterol    Medial meniscus tear 01/2012   right knee   Obesity    Pneumonia    Renal disorder    hematuria,kidney stones   Sleep apnea    Vertigo, benign paroxysmal 2015    Past Surgical History:  Procedure Laterality Date   CORNEAL TRANSPLANT  12/2009   KNEE ARTHROSCOPY  02/20/2012   Procedure: ARTHROSCOPY KNEE;  Surgeon: Alta Corning, MD;  Location: [facility];  Service: Orthopedics;  Laterality: Right;  Chondroplasia patella femoral joint, medial meniscal debriedment    There were no vitals filed for this visit.    Subjective Assessment - 03/05/21 1103     Subjective Has history of vertigo, dating back to 2016-17. Patient reports as of lately, has a spell every couple weeks. Patient reports the spell can last 2-3 days, but the actual spell itself lasts approx 5-10 minutes at times, reports it fluctuates and at times can be less severe. Patient reports can feel unbalanced at times, reports 4 falls in the past 6 months. Reports  some blurred vision, feels like takes longer for the eyes to focus. Patient does report some tingling on the R side of the face at time. Patient reports history of migraine, last noted migraine in Dec 2022. Denies hearings changes, tinnitus, or aural fullness. Has spinning sensation, along with nausea and vomitting. Has spinning sensation with rolling, notices it on bilateral sides.    Pertinent History Arthritis, Asthma, DM, GERD, High Cholesterol, Obesity, Vertigo    Limitations Standing;Walking;House hold activities    Diagnostic tests None noted    Patient Stated Goals Resolve the Vertigo    Currently in Pain? No/denies                [facility] PT Assessment - 03/05/21 0001       Assessment   Medical Diagnosis Vertigo    Referring Provider (PT) Zenia Resides, MD    Onset Date/Surgical Date 03/04/21   referral date; started approx in 2016-2017   Hand Dominance Right      Precautions   Precautions Other (comment)    Precaution Comments Arthritis, Asthma, DM, GERD, High Cholesterol, Obesity, Vertigo      Restrictions   Weight Bearing Restrictions No      Balance Screen   Has the patient fallen in the past 6 months Yes    How  many times? 4    Has the patient had a decrease in activity level because of a fear of falling?  No    Is the patient reluctant to leave their home because of a fear of falling?  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      Cognition   Overall Cognitive Status Within Functional Limits for tasks assessed      Observation/Other Assessments   Focus on  Therapeutic Outcomes (FOTO)  Staff Did Not Capture on Eval      Sensation   Light Touch Appears Intact      ROM / Strength   AROM / PROM / Strength Strength      Strength   Overall Strength Within functional limits for tasks performed      Transfers   Transfers Sit to Stand;Stand to Sit    Sit to Stand 7: Independent    Stand to Sit 7: Independent      Ambulation/Gait   Ambulation/Gait Yes    Ambulation/Gait Assistance 7: Independent    Assistive device None    Gait Pattern Within Functional Limits    Ambulation Surface Level;Indoor      Functional Gait  Assessment   Gait assessed  --   TBA at next visit               Vestibular Assessment - 03/05/21 0001       Symptom Behavior   Subjective history of current problem See Subjective    Type of Dizziness  Spinning;Unsteady with head/body turns    Frequency of Dizziness episodes every 2-3 weeks, last 2-3 days    Duration of Dizziness seconds to minutes; with specific movement    Symptom Nature Motion provoked;Positional;Intermittent    Aggravating Factors Lying supine;Turning body quickly;Turning head quickly;Forward bending;Rolling to left;Rolling to right    Relieving Factors Closing eyes;Slow movements    Progression of Symptoms Worse      Oculomotor Exam   Oculomotor Alignment Normal    Ocular ROM WNL    Spontaneous Absent    Gaze-induced  Absent    Smooth Pursuits Intact    Saccades Intact      Oculomotor Exam-Fixation Suppressed    Left Head Impulse Negative    Right Head Impulse Negative      Vestibulo-Ocular Reflex   VOR 1 Head Only (x 1 viewing) Normal; No Dizziness    VOR Cancellation Normal      Positional Testing   Dix-Hallpike Dix-Hallpike Right;Dix-Hallpike Left    Sidelying Test Sidelying Left;Sidelying Right    Horizontal Canal Testing Horizontal Canal Right;Horizontal Canal Left      Dix-Hallpike Right   Dix-Hallpike Right Duration 0    Dix-Hallpike Right Symptoms No nystagmus       Dix-Hallpike Left   Dix-Hallpike Left Duration 0    Dix-Hallpike Left Symptoms No nystagmus      Sidelying Right   Sidelying Right Duration 0    Sidelying Right Symptoms No nystagmus      Sidelying Left  Sidelying Left Duration 0    Sidelying Left Symptoms No nystagmus      Horizontal Canal Right   Horizontal Canal Right Duration 0    Horizontal Canal Right Symptoms Normal      Horizontal Canal Left   Horizontal Canal Left Duration 0    Horizontal Canal Left Symptoms Normal      Positional Sensitivities   Sit to Supine No dizziness    Supine to Left Side No dizziness    Supine to Right Side No dizziness    Supine to Sitting No dizziness    Right Hallpike No dizziness    Up from Right Hallpike No dizziness    Up from Left Hallpike No dizziness    Nose to Right Knee No dizziness    Right Knee to Sitting No dizziness    Nose to Left Knee No dizziness    Left Knee to Sitting No dizziness    Head Turning x 5 No dizziness    Head Nodding x 5 No dizziness    Pivot Right in Standing No dizziness    Pivot Left in Standing No dizziness    Rolling Right No dizziness    Rolling Left No dizziness                Objective measurements completed on examination: See above findings.                PT Education - 03/05/21 1247     Education Details Educated on [employer]) Educated Patient    Methods Explanation    Comprehension Verbalized understanding              PT Short Term Goals - 03/05/21 1247       PT SHORT TERM GOAL #1   Title = LTGs               PT Long Term Goals - 03/05/21 1257       PT LONG TERM GOAL #1   Title Pt will be independent with initial vestibular/balance HEP    Baseline no HEP established    Time 4    Period Weeks    Status New    Target Date 04/09/21      PT LONG TERM GOAL #2   Title LTG to be set for FGA/SOT    Baseline TBA    Time 4    Period Weeks    Status New      PT LONG TERM  GOAL #3   Title Pt will demo (-) positional testing and report understanding of home manuevers in case of reoccurence.    Time 4    Period Weeks    Status New                    Plan - 03/05/21 1249     Clinical Impression Statement Patient is a 46 y.o. male referred to Neuro OPPT services for Vertigo. Patient's PMH includes the following: Arthritis, Asthma, DM, GERD, High Cholesterol, Obesity, Vertigo. Upon evaluation, patient presents with the following impairments: dizziness and unsteadiness on feet. Patient presents with normal oculomotor exam, negative HIT bilaterally and negative positional testing. Unable to elicit vertigo symptoms today during session, will plan to have follow up to further assess with frenzel goggles at next session. Also further balance assesment TBA at next visit due to reports of imbalance and falls. Patient will benefit from skilled PT services to address impairments  and reduce vertiginous symptoms.    Personal Factors and Comorbidities Comorbidity 3+;Time since onset of injury/illness/exacerbation    Comorbidities Arthritis, Asthma, GERD, High Cholesterol, Obesity, Vertigo    Examination-Activity Limitations Bed Mobility;Transfers;Stairs;Bend    Examination-Participation Restrictions Occupation;Community Activity    Stability/Clinical Decision Making Stable/Uncomplicated    Clinical Decision Making Low    Rehab Potential Good    PT Frequency 1x / week    PT Duration 4 weeks    PT Treatment/Interventions ADLs/Self Care Home Management;Canalith Repostioning;Therapeutic activities;Therapeutic exercise;Balance training;Gait training;Stair training;Neuromuscular re-education;Patient/family education;Manual techniques;Dry needling;Vestibular    PT Next Visit Plan Reassess BPPV with Frenzel Goggles, Complete FGA/SOT    Consulted and Agree with Plan of Care Patient             Patient will benefit from skilled therapeutic intervention in order to improve  the following deficits and impairments:  Decreased balance, Decreased activity tolerance, Dizziness  Visit Diagnosis: Dizziness and giddiness  Unsteadiness on feet     Problem List Patient Active Problem List   Diagnosis Date Noted   Rhinitis 03/04/2021   Chronic left shoulder pain 12/16/2020   Screening for malignant neoplasm of prostate 12/16/2020   Chronic midline low back pain without sciatica 12/16/2020   Prediabetes 12/25/2019   Internal hemorrhoids without complication 38/93/7342   Other tear of medial meniscus, current injury, left knee, subsequent encounter 05/04/2017   Pes planus 02/07/2017   Laryngopharyngeal reflux (LPR) 07/24/2015   Post corneal transplant 07/25/2014   Vertigo 08/30/2013   Adrenal mass, right ([place]) 05/22/2013   Sleep apnea 07/31/2009   OBESITY, NOS 03/16/2006    Jones Bales, PT, DPT 03/05/2021, 1:03 PM  [facility] [address] [place], [place], [address] Phone: [phone]   Fax:  [phone]  Name: Kohei Antonellis II MRN: [id number] Date of Birth: 22-Apr-1975

## 2021-03-11 NOTE — Progress Notes (Addendum)
NEUROLOGY CONSULTATION NOTE  Derrick Scott MRN: [id number] DOB: 1975-04-10  Referring provider: [name], MD Primary care provider: [name], MD  Reason for consult:  vertigo  Assessment/Plan:   Suspect benign paroxysmal positional vertigo based on semiology.  I do not suspect vestibular migraines as the spells are only positional and last just seconds.  Migraines are also infrequent.  However, given his reported right sided facial tingling and paroxysmal stabbing pain, will check for secondary intracranial abnormality.    MRI of brain without contrast Further recommendations pending results.  03/22/2021 ADDENDUM:  MRI of brain with and without contrast shows mild chronic small vessel ischemic changes but nothing more concerning.  No further workup warranted. Metta Clines, DO   Subjective:  Derrick Scott is a 46 year old male with asthma, hyperglycemia, and high cholesterol who presents for vertigo.  History supplemented by primary care notes.   He had his first episode of vertigo in 2016.  He woke up one morning and the room was spinning.  Brought on by change in position or quick head turns.  Lasts 10 seconds but can be severe and accompanied by nausea.  Usually occurs for 3-4 days.  He had another episode in 2018.  He had two episodes in 2021.  In 2022, he had an episode in July and then October.  This year, he had an episode in January and one earlier this month.  He went to vestibular rehab last week but did not have any vertigo and the therapist was not able to elicit an attack.  He has a follow up next week.  He is concerned because he sometimes feels brief paroxysmal stabs in his head and occasionally notes tingling on the right side of his face.  His father-in-law previously had dizziness and headache and turned out to have had a stroke.  He denies family history of cerebral aneurysm.  He does have history of migraines.  They are well-controlled since using a CPAP  for his OSA.  He had about 3 or 4 last year.   PAST MEDICAL HISTORY: Past Medical History:  Diagnosis Date   Arthritis    knee bilateral   Asthma    as a child "NO attacks"   Diabetes mellitus without complication ([place])    border line no medicationa   GERD (gastroesophageal reflux disease)    High cholesterol    Medial meniscus tear 01/2012   right knee   Obesity    Pneumonia    Renal disorder    hematuria,kidney stones   Sleep apnea    Vertigo, benign paroxysmal 2015    PAST SURGICAL HISTORY: Past Surgical History:  Procedure Laterality Date   CORNEAL TRANSPLANT  12/2009   KNEE ARTHROSCOPY  02/20/2012   Procedure: ARTHROSCOPY KNEE;  Surgeon: Alta Corning, MD;  Location: [facility];  Service: Orthopedics;  Laterality: Right;  Chondroplasia patella femoral joint, medial meniscal debriedment    MEDICATIONS: Current Outpatient Medications on File Prior to Visit  Medication Sig Dispense Refill   atorvastatin (LIPITOR) 40 MG tablet Take 1 tablet (40 mg total) by mouth daily. 90 tablet 3   clonazePAM (KLONOPIN) 0.5 MG tablet Take 1 tablet (0.5 mg total) by mouth 2 (two) times daily. Stop when no vertigo for two days. 20 tablet 1   fluticasone (FLONASE) 50 MCG/ACT nasal spray Place 2 sprays into both nostrils daily. 16 g 6   meclizine (ANTIVERT) 25 MG tablet Take 1 tablet (25 mg  total) by mouth 3 (three) times daily as needed for dizziness. 30 tablet 0   ondansetron (ZOFRAN-ODT) 4 MG disintegrating tablet Take 1 tablet (4 mg total) by mouth every 8 (eight) hours as needed for nausea. 12 tablet 0   No current facility-administered medications on file prior to visit.    ALLERGIES: Allergies  Allergen Reactions   Shrimp [Shellfish Allergy] Anaphylaxis    FAMILY HISTORY: Family History  Problem Relation Age of Onset   Asthma Mother    Diabetes Father    Cancer Father        prostate   Osteoarthritis Sister    Diabetes Sister    Colon cancer Neg Hx     Colon polyps Neg Hx    Esophageal cancer Neg Hx    Rectal cancer Neg Hx    Stomach cancer Neg Hx     Objective:  Blood pressure (!) 143/88, pulse 74, resp. rate 18, height 5' (1.524 m), weight 298 lb (135.2 kg), SpO2 98 %. General: No acute distress.  Patient appears well-groomed.   Head:  Normocephalic/atraumatic Eyes:  fundi examined but not visualized Neck: supple, no paraspinal tenderness, full range of motion Back: No paraspinal tenderness Heart: regular rate and rhythm Lungs: Clear to auscultation bilaterally. Vascular: No carotid bruits. Neurological Exam: Mental status: alert and oriented to person, place, and time, recent and remote memory intact, fund of knowledge intact, attention and concentration intact, speech fluent and not dysarthric, language intact. Cranial nerves: CN I: not tested CN Scott: pupils equal, round and reactive to light, visual fields intact CN III, IV, VI:  full range of motion, no nystagmus, no ptosis CN V: facial sensation intact. CN VII: upper and lower face symmetric CN VIII: hearing intact CN IX, X: gag intact, uvula midline CN XI: sternocleidomastoid and trapezius muscles intact CN XII: tongue midline Bulk & Tone: normal, no fasciculations. Motor:  muscle strength 5/5 throughout Sensation:  Pinprick, temperature and vibratory sensation intact. Deep Tendon Reflexes:  2+ throughout,  toes downgoing.   Finger to nose testing:  Without dysmetria.   Heel to shin:  Without dysmetria.   Gait:  Normal station and stride.  Romberg negative.    Thank you for allowing me to take part in the care of this patient.  Metta Clines, DO  CC: [name], MD

## 2021-03-12 ENCOUNTER — Telehealth: Payer: Self-pay | Admitting: Family Medicine

## 2021-03-12 ENCOUNTER — Encounter: Payer: Self-pay | Admitting: Neurology

## 2021-03-12 ENCOUNTER — Ambulatory Visit (INDEPENDENT_AMBULATORY_CARE_PROVIDER_SITE_OTHER): Payer: BC Managed Care – PPO | Admitting: Neurology

## 2021-03-12 ENCOUNTER — Other Ambulatory Visit: Payer: Self-pay

## 2021-03-12 VITALS — BP 143/88 | HR 74 | Resp 18 | Ht 60.0 in | Wt 298.0 lb

## 2021-03-12 DIAGNOSIS — H8110 Benign paroxysmal vertigo, unspecified ear: Secondary | ICD-10-CM | POA: Diagnosis not present

## 2021-03-12 DIAGNOSIS — R202 Paresthesia of skin: Secondary | ICD-10-CM

## 2021-03-12 NOTE — Telephone Encounter (Signed)
Patient  dropped off form at front desk for Disability and leave .  Verified that patient section of form has been completed.  Last DOS/WCC with PCP was 03/04/2021.  Placed form in [facility]  team folder to be completed by clinical staff.  Derrick Scott

## 2021-03-12 NOTE — Patient Instructions (Addendum)
I suspect regular vertigo but will check MRI of brain to make sure there is nothing else going on.  We have sent a referral to [facility] for your MRI and they will call you directly to schedule your appointment. They are located at [place]. If you need to contact them directly please call [phone].

## 2021-03-15 ENCOUNTER — Ambulatory Visit: Payer: BC Managed Care – PPO

## 2021-03-16 NOTE — Telephone Encounter (Signed)
Clinical info completed on Short Term Disability form.  Place form in Dr. Verlon Au box for completion.  Ottis Stain, CMA

## 2021-03-17 ENCOUNTER — Encounter: Payer: Self-pay | Admitting: Family Medicine

## 2021-03-17 ENCOUNTER — Ambulatory Visit (INDEPENDENT_AMBULATORY_CARE_PROVIDER_SITE_OTHER): Payer: BC Managed Care – PPO | Admitting: Family Medicine

## 2021-03-17 ENCOUNTER — Other Ambulatory Visit: Payer: Self-pay

## 2021-03-17 VITALS — BP 116/62 | HR 97 | Wt 295.6 lb

## 2021-03-17 DIAGNOSIS — R42 Dizziness and giddiness: Secondary | ICD-10-CM | POA: Diagnosis not present

## 2021-03-18 ENCOUNTER — Ambulatory Visit
Admission: RE | Admit: 2021-03-18 | Discharge: 2021-03-18 | Disposition: A | Discharge status: Home / Self Care | Payer: BC Managed Care – PPO | Source: Ambulatory Visit | Attending: Neurology | Admitting: Neurology

## 2021-03-18 DIAGNOSIS — R42 Dizziness and giddiness: Secondary | ICD-10-CM | POA: Diagnosis not present

## 2021-03-18 NOTE — Assessment & Plan Note (Signed)
Evaluated by Dr. Tomi Likens, neurology February 19, 2000 3.  Work-up is ongoing but they seem to think it is either BPPV with potentially some overlay of eustachian tube dysfunction.  Less likely M?ni?re's disease. ? ?I have urged him to continue to use the Flonase nasal spray daily ongoing.  We discussed monitoring his symptoms.  I do think it safe for him to return to work and driving as well as activities of daily living.  Should he start to develop symptoms, he is aware that he needs to get to a safe place immediately.  I have filled out paperwork for him to return to work and for his  short-term disability.  I will see him back 1 to 2 months, sooner with new or worsening symptoms. ?

## 2021-03-18 NOTE — Progress Notes (Signed)
? ? ?  CHIEF COMPLAINT / HPI: ?Follow-up for vertigo.  Had vertigo symptoms intermittently for years but recently they came and stayed for several days.  Was seen and referred to neurology.  Saw the neurologist 3 days ago and has been set up for MRI.  Neurology seems to think it is probably BPPV.  Masao is worried that it could be some indicator of stroke so he is very happy that we are doing further work-up. ? ?Currently symptoms have resolved.  On discussion of his episodes, he can usually tell when they are coming on with at least 1 to 2 minutes of morning, sometimes several minutes of warning.  Neurologist had cleared him to return to work and he has done that. ? ? ?PERTINENT  PMH / PSH: I have reviewed the patient?s medications, allergies, past medical and surgical history, smoking status and updated in the EMR as appropriate. ? ? ?OBJECTIVE: ? BP 116/62   Pulse 97   Wt 295 lb 9.6 oz (134.1 kg)   SpO2 98%   BMI 57.73 kg/m?  ?GENERAL: Well-developed male no acute distress ?HEENT: Extraocular muscles are intact.  No nystagmus.  Pupils equal round reactive to light. ?NEURO: Cranial nerves II through XII are grossly intact.  Stance steady with arms outstretched for 2 minutes without any wavering or dizziness.  Cerebellar testing is normal.  Gait is normal. ?PSYCH: AxOx4. Good eye contact.. No psychomotor retardation or agitation. Appropriate speech fluency and content. Asks and answers questions appropriately. Mood is congruent. ? ? ?ASSESSMENT / PLAN: ?Total time spent in review of neurology consultation, prior office visits, examination, filling out disability forms and in consultation equal 30 minutes. ? ?Vertigo ?Evaluated by Dr. Tomi Likens, neurology February 19, 2000 3.  Work-up is ongoing but they seem to think it is either BPPV with potentially some overlay of eustachian tube dysfunction.  Less likely M?ni?re's disease. ? ?I have urged him to continue to use the Flonase nasal spray daily ongoing.  We  discussed monitoring his symptoms.  I do think it safe for him to return to work and driving as well as activities of daily living.  Should he start to develop symptoms, he is aware that he needs to get to a safe place immediately.  I have filled out paperwork for him to return to work and for his  short-term disability.  I will see him back 1 to 2 months, sooner with new or worsening symptoms. ?  ?[name] MD ?

## 2021-03-19 NOTE — Progress Notes (Signed)
Pt advised of his MRI results.

## 2021-03-22 ENCOUNTER — Other Ambulatory Visit: Payer: Self-pay

## 2021-03-22 ENCOUNTER — Encounter: Payer: Self-pay | Admitting: Physical Therapy

## 2021-03-22 ENCOUNTER — Ambulatory Visit: Payer: BC Managed Care – PPO | Attending: Family Medicine | Admitting: Physical Therapy

## 2021-03-22 DIAGNOSIS — R42 Dizziness and giddiness: Secondary | ICD-10-CM | POA: Insufficient documentation

## 2021-03-22 NOTE — Therapy (Signed)
?OUTPATIENT PHYSICAL THERAPY TREATMENT NOTE ? ? ?Patient Name: Derrick Scott ?MRN: [id number] ?DOB:July 15, 1975, 46 y.o., male ?Today's Date: 03/22/2021 ? ?PCP: Dickie La, MD ?REFERRING PROVIDER: Dickie La, MD ? ? PT End of Session - 03/22/21 3086   ? ? Visit Number 2   ? Number of Visits 5   ? Date for PT Re-Evaluation 04/09/21   ? Authorization Type BCBS   ? PT Start Time 0800   ? PT Stop Time 5784   ? PT Time Calculation (min) 27 min   ? Activity Tolerance Patient tolerated treatment well   ? Behavior During Therapy [facility] for tasks assessed/performed   ? ?  ?  ? ?  ? ? ?Past Medical History:  ?Diagnosis Date  ? Arthritis   ? knee bilateral  ? Asthma   ? as a child "NO attacks"  ? Diabetes mellitus without complication ([place])   ? border line no medicationa  ? GERD (gastroesophageal reflux disease)   ? High cholesterol   ? Medial meniscus tear 01/2012  ? right knee  ? Obesity   ? Pneumonia   ? Renal disorder   ? hematuria,kidney stones  ? Sleep apnea   ? Vertigo, benign paroxysmal 2015  ? ?Past Surgical History:  ?Procedure Laterality Date  ? CORNEAL TRANSPLANT  12/2009  ? KNEE ARTHROSCOPY  02/20/2012  ? Procedure: ARTHROSCOPY KNEE;  Surgeon: Alta Corning, MD;  Location: [place];  Service: Orthopedics;  Laterality: Right;  Chondroplasia patella femoral joint, medial meniscal debriedment  ? ?Patient Active Problem List  ? Diagnosis Date Noted  ? Rhinitis 03/04/2021  ? Chronic left shoulder pain 12/16/2020  ? Screening for malignant neoplasm of prostate 12/16/2020  ? Chronic midline low back pain without sciatica 12/16/2020  ? Prediabetes 12/25/2019  ? Internal hemorrhoids without complication 69/62/9528  ? Other tear of medial meniscus, current injury, left knee, subsequent encounter 05/04/2017  ? Pes planus 02/07/2017  ? Laryngopharyngeal reflux (LPR) 07/24/2015  ? Post corneal transplant 07/25/2014  ? Vertigo 08/30/2013  ? Adrenal mass, right ([place]) 05/22/2013  ? Sleep apnea 07/31/2009  ?  OBESITY, NOS 03/16/2006  ? ? ?REFERRING DIAG: R42 (ICD-10-CM) - Vertigo  ? ?THERAPY DIAG:  ?Dizziness and giddiness ? ?PERTINENT HISTORY: Asthma, DM without complication, sleep apnea, h/o BPPV in 2015 & 2018 ? ?PRECAUTIONS: None ? ?SUBJECTIVE: Pt reports he has not had any vertigo since the eval - saw Dr. Nori Riis last week and she confirmed that it was BPPV ? ?PAIN:  ?Are you having pain? No ? ? ? ?TODAY'S TREATMENT:  ?Rt Dix-Hallpike test (-) with no nystagmus and no c/o vertigo ?Lt Dix-Hallpike test (-) with no nystagmus and o c/o vertigo ?Rt and Lt sidelying tests (-) with no nystagmus and no c/o vertigo ? ? ?PATIENT EDUCATION: ?Education details: Pt was instructed in Epley maneuver for self treatment prn; also gave pt info on BPPV etiology from [place] and also from [facility] on computer ?Person educated: Patient ?Education method: Explanation, Demonstration, and Handouts ?Education comprehension: verbalized understanding and returned demonstration ? ? ?HOME EXERCISE PROGRAM: ?Pt was instructed in Epley maneuver for self treatment prn should BPPV re-occur in the future; no HEP needed as no balance deficits and no vertigo reported at current time ? ? PT Short Term Goals - 03/05/21 1247   ? ?  ? PT SHORT TERM GOAL #1  ? Title = LTGs   ? ?  ?  ? ?  ? ? ?  PT Long Term Goals - 03/05/21 1257   ? ?  ? PT LONG TERM GOAL #1  ? Title Pt will be independent with initial vestibular/balance HEP   ? Baseline no HEP established   ? Time 4   ? Period Weeks   ? Status No HEP needed due to BPPV having resolved - 03-22-21  ? Target Date 04/09/21   ?  ? PT LONG TERM GOAL #2  ? Title LTG to be set for FGA/SOT   ? Baseline TBA   ? Time 4   ? Period Weeks   ? Status Goal deferred due to no c/o vertigo or balance issues at this time  ?  ? PT LONG TERM GOAL #3  ? Title Pt will demo (-) positional testing and report understanding of home manuevers in case of reoccurence.   ? Time 4   ? Period Weeks   ? Status Goal met  ? ?  ?  ? ?  ? ? ? Plan -  03/22/21 0838   ? ? Clinical Impression Statement Pt has negative positional testing with negative Rt and Lt Dix-Hallpike tests and negative sidelying tests.  Pt reports no problems with balance when vertigo is not occurring, therefore, did not feel that FGA and SOT were needed for further testing at this time as pt reports no vertigo and no problems with gait or balance.  All LTG's met at this time.  Pt is discharged due to goals met and no c/o vertigo.   ? Personal Factors and Comorbidities Comorbidity 3+;Time since onset of injury/illness/exacerbation   ? Comorbidities Arthritis, Asthma, GERD, High Cholesterol, Obesity, Vertigo   ? Examination-Activity Limitations Bed Mobility;Transfers;Stairs;Bend   ? Examination-Participation Restrictions Occupation;Community Activity   ? Stability/Clinical Decision Making Stable/Uncomplicated   ? Rehab Potential Good   ? PT Frequency 1x / week   ? PT Duration 4 weeks   ? PT Treatment/Interventions ADLs/Self Care Home Management;Canalith Repostioning;Therapeutic activities;Therapeutic exercise;Balance training;Gait training;Stair training;Neuromuscular re-education;Patient/family education;Manual techniques;Dry needling;Vestibular   ? Consulted and Agree with Plan of Care Patient   ? ?  ?  ? ?  ? ? ? ?PHYSICAL THERAPY DISCHARGE SUMMARY ? ?Visits from Start of Care: 2 ? ?Current functional level related to goals / functional outcomes: ?All goals met ?  ?Remaining deficits: ? None regarding vertigo ?  ?Education / Equipment: ?Pt has been instructed in Epley maneuver for self treatment prn should BPPV re-occur in the future.    ? ?Patient agrees to discharge. Patient goals were met. Patient is being discharged due to meeting the stated rehab goals.  ? ? ?UQJFHL, KTGYB WLSLHTD, PT ?03/22/2021, 9:38 AM ? ?   ? ?

## 2021-03-22 NOTE — Patient Instructions (Addendum)
Benign Positional Vertigo Vertigo is the feeling that you or your surroundings are moving when they are not. Benign positional vertigo is the most common form of vertigo. This is usually a harmless condition (benign). This condition is positional. This means that symptoms are triggered by certain movements and positions. This condition can be dangerous if it occurs while you are doing something that could cause harm to yourself or others. This includes activities such as driving or operating machinery. What are the causes? The inner ear has fluid-filled canals that help your brain sense movement and balance. When the fluid moves, the brain receives messages about your body's position. With benign positional vertigo, calcium crystals in the inner ear break free and disturb the inner ear area. This causes your brain to receive confusing messages about your body's position. What increases the risk? You are more likely to develop this condition if: You are a woman. You are 29 years of age or older. You have recently had a head injury. You have an inner ear disease. What are the signs or symptoms? Symptoms of this condition usually happen when you move your head or your eyes in different directions. Symptoms may start suddenly and usually last for less than a minute. They include: Loss of balance and falling. Feeling like you are spinning or moving. Feeling like your surroundings are spinning or moving. Nausea and vomiting. Blurred vision. Dizziness. Involuntary eye movement (nystagmus). Symptoms can be mild and cause only minor problems, or they can be severe and interfere with daily life. Episodes of benign positional vertigo may return (recur) over time. Symptoms may also improve over time. How is this diagnosed? This condition may be diagnosed based on: Your medical history. A physical exam of the head, neck, and ears. Positional tests to check for or stimulate vertigo. You may be asked to  turn your head and change positions, such as going from sitting to lying down. A health care provider will watch for symptoms of vertigo. You may be referred to a health care provider who specializes in ear, nose, and throat problems (ENT or otolaryngologist) or a provider who specializes in disorders of the nervous system (neurologist). How is this treated? This condition may be treated in a session in which your health care provider moves your head in specific positions to help the displaced crystals in your inner ear move. Treatment for this condition may take several sessions. Surgery may be needed in severe cases, but this is rare. In some cases, benign positional vertigo may resolve on its own in 2-4 weeks. Follow these instructions at home: Safety Move slowly. Avoid sudden body or head movements or certain positions, as told by your health care provider. Avoid driving or operating machinery until your health care provider says it is safe. Avoid doing any tasks that would be dangerous to you or others if vertigo occurs. If you have trouble walking or keeping your balance, try using a cane for stability. If you feel dizzy or unstable, sit down right away. Return to your normal activities as told by your health care provider. Ask your health care provider what activities are safe for you. General instructions Take over-the-counter and prescription medicines only as told by your health care provider. Drink enough fluid to keep your urine pale yellow. Keep all follow-up visits. This is important. Contact a health care provider if: You have a fever. Your condition gets worse or you develop new symptoms. Your family or friends notice any behavioral changes. You  have nausea or vomiting that gets worse. You have numbness or a prickling and tingling sensation. Get help right away if you: Have difficulty speaking or moving. Are always dizzy or faint. Develop severe headaches. Have weakness in  your legs or arms. Have changes in your hearing or vision. Develop a stiff neck. Develop sensitivity to light. These symptoms may represent a serious problem that is an emergency. Do not wait to see if the symptoms will go away. Get medical help right away. Call your local emergency services (911 in the [place] Do not drive yourself to the hospital. Summary Vertigo is the feeling that you or your surroundings are moving when they are not. Benign positional vertigo is the most common form of vertigo. This condition is caused by calcium crystals in the inner ear that become displaced. This causes a disturbance in an area of the inner ear that helps your brain sense movement and balance. Symptoms include loss of balance and falling, feeling that you or your surroundings are moving, nausea and vomiting, and blurred vision. This condition can be diagnosed based on symptoms, a physical exam, and positional tests. Follow safety instructions as told by your health care provider and keep all follow-up visits. This is important. This information is not intended to replace advice given to you by your health care provider. Make sure you discuss any questions you have with your health care provider. Document Revised: 12/04/2019 Document Reviewed: 12/04/2019 Elsevier Patient Education  2022 [place].   How to Perform the Epley Maneuver The Epley maneuver is an exercise that relieves symptoms of vertigo. Vertigo is the feeling that you or your surroundings are moving when they are not. When you feel vertigo, you may feel like the room is spinning and may have trouble walking. The Epley maneuver is used for a type of vertigo caused by a calcium deposit in a part of the inner ear. The maneuver involves changing head positions to help the deposit move out of the area. You can do this maneuver at home whenever you have symptoms of vertigo. You can repeat it in 24 hours if your vertigo has not gone away. Even  though the Epley maneuver may relieve your vertigo for a few weeks, it is possible that your symptoms will return. This maneuver relieves vertigo, but it does not relieve dizziness. What are the risks? If it is done correctly, the Epley maneuver is considered safe. Sometimes it can lead to dizziness or nausea that goes away after a short time. If you develop other symptoms--such as changes in vision, weakness, or numbness--stop doing the maneuver and call your health care provider. Supplies needed: A bed or table. A pillow. How to do the Epley maneuver   Sit on the edge of a bed or table with your back straight and your legs extended or hanging over the edge of the bed or table. Turn your head halfway toward the affected ear or side as told by your health care provider. Lie backward quickly with your head turned until you are lying flat on your back. Your head should dangle (head-hanging position). You may want to position a pillow under your shoulders. Hold this position for at least 30 seconds. If you feel dizzy or have symptoms of vertigo, continue to hold the position until the symptoms stop. Turn your head to the opposite direction until your unaffected ear is facing down. Your head should continue to dangle. Hold this position for at least 30 seconds. If you  feel dizzy or have symptoms of vertigo, continue to hold the position until the symptoms stop. Turn your whole body to the same side as your head so that you are positioned on your side. Your head will now be nearly facedown and no longer needs to dangle. Hold for at least 30 seconds. If you feel dizzy or have symptoms of vertigo, continue to hold the position until the symptoms stop. Sit back up. You can repeat the maneuver in 24 hours if your vertigo does not go away. Follow these instructions at home: For 24 hours after doing the Epley maneuver: Keep your head in an upright position. When lying down to sleep or rest, keep your head  raised (elevated) with two or more pillows. Avoid excessive neck movements. Activity Do not drive or use machinery if you feel dizzy. After doing the Epley maneuver, return to your normal activities as told by your health care provider. Ask your health care provider what activities are safe for you. General instructions Drink enough fluid to keep your urine pale yellow. Do not drink alcohol. Take over-the-counter and prescription medicines only as told by your health care provider. Keep all follow-up visits. This is important. Preventing vertigo symptoms Ask your health care provider if there is anything you should do at home to prevent vertigo. He or she may recommend that you: Keep your head elevated with two or more pillows while you sleep. Do not sleep on the side of your affected ear. Get up slowly from bed. Avoid sudden movements during the day. Avoid extreme head positions or movement, such as looking up or bending over. Contact a health care provider if: Your vertigo gets worse. You have other symptoms, including: Nausea. Vomiting. Headache. Get help right away if you: Have vision changes. Have a headache or neck pain that is severe or getting worse. Cannot stop vomiting. Have new numbness or weakness in any part of your body. These symptoms may represent a serious problem that is an emergency. Do not wait to see if the symptoms will go away. Get medical help right away. Call your local emergency services (911 in the [place] Do not drive yourself to the hospital. Summary Vertigo is the feeling that you or your surroundings are moving when they are not. The Epley maneuver is an exercise that relieves symptoms of vertigo. If the Epley maneuver is done correctly, it is considered safe. This information is not intended to replace advice given to you by your health care provider. Make sure you discuss any questions you have with your health care provider. Document Revised:  12/04/2019 Document Reviewed: 12/04/2019 Elsevier Patient Education  [place] for Right Posterior / Anterior Canalithiasis    Sitting on bed: 1. Turn head 45 right. (a) Lie back slowly, shoulders on pillow, head on bed. (b) Hold __20-30__ seconds. 2. Keeping head on bed, turn head 90 left. Hold _20-30___ seconds. 3. Roll to left, head on 45 angle down toward bed. Hold _20-30___ seconds. 4. Sit up on left side of bed. Repeat __3__ times per session. Do __2_ sessions per day.  Copyright  VHI. All rights reserved.

## 2021-04-28 ENCOUNTER — Ambulatory Visit (INDEPENDENT_AMBULATORY_CARE_PROVIDER_SITE_OTHER): Payer: BC Managed Care – PPO | Admitting: Sports Medicine

## 2021-04-28 DIAGNOSIS — G8929 Other chronic pain: Secondary | ICD-10-CM | POA: Diagnosis not present

## 2021-04-28 DIAGNOSIS — M25512 Pain in left shoulder: Secondary | ICD-10-CM

## 2021-04-28 MED ORDER — MELOXICAM 15 MG PO TABS: 15.0000 mg | ORAL_TABLET | Freq: Every day | ORAL | 0 refills | Status: DC | PRN

## 2021-04-28 NOTE — Assessment & Plan Note (Signed)
History of left shoulder pain with last visit in November 2022 at which time patient received left subacromial joint injection for suspected rotator cuff syndrome. Given examination today with painful arc and pain with empty can test, suspect patient is suffering from exacerbation of rotator cuff tendinopathy. No red flags to suggest rotator cuff tear. Will start with conservative management with NSAIDs and strengthening exercises. If no improvement in 3 weeks, will consider ultrasound with possible steroid injection.  ? ?- Meloxicam 15 mg daily for 7-10 days. PRN afterwards ?- Strengthening exercises provided ?- Follow up in 3 weeks  ? ?Discussed and seen with Dr. Rolena Infante.  ?

## 2021-04-28 NOTE — Patient Instructions (Signed)
Please take the meloxicam daily with food for the next 7-10 days. Then take as needed after that. ?Please do your exercises daily. ?Follow up in 3 weeks. ?

## 2021-04-28 NOTE — Progress Notes (Addendum)
PCP: Dickie La, MD ? ?Subjective:  ? ?HPI: ?Patient is a 46 y.o. male with a PMHx of left shoulder pain suspected to be due to rotator cuff syndrome here today for acute left shoulder pain. ? ?[name] states that after his subacromial steroid injection in November 2022 for rotator cuff syndrome, his pain significantly improved.  He was able to return to the gym and lift both light and heavy weights without any pain.  He continued to do his rotator cuff exercises regularly. ? ?Over this weekend, he was pulling luggage out from his car, when he had sudden sharp pain in the superior aspect of the shoulder.  The pain occurred when the luggage was dropping to the ground, not when he was pulling it.  He denies hearing any pops or cracks.  Since then, he has pain with abduction beyond 90 degrees.  This has limited his ability to work out at [employer].  He denies any radiation of the pain at this time, numbness, tingling, weakness.  He denies any fever, chills or other joint pain at this time. He has tried taking Ibuprofen 800 mg once daily for several days without improvement. He has not tried ice or heat application.  ? ?Past Medical History:  ?Diagnosis Date  ? Arthritis   ? knee bilateral  ? Asthma   ? as a child "NO attacks"  ? Diabetes mellitus without complication ([place])   ? border line no medicationa  ? GERD (gastroesophageal reflux disease)   ? High cholesterol   ? Medial meniscus tear 01/2012  ? right knee  ? Obesity   ? Pneumonia   ? Renal disorder   ? hematuria,kidney stones  ? Sleep apnea   ? Vertigo, benign paroxysmal 2015  ? ?Current Outpatient Medications on File Prior to Visit  ?Medication Sig Dispense Refill  ? atorvastatin (LIPITOR) 40 MG tablet Take 1 tablet (40 mg total) by mouth daily. 90 tablet 3  ? fluticasone (FLONASE) 50 MCG/ACT nasal spray Place 2 sprays into both nostrils daily. 16 g 6  ? meclizine (ANTIVERT) 25 MG tablet Take 1 tablet (25 mg total) by mouth 3 (three) times daily as needed  for dizziness. 30 tablet 0  ? ondansetron (ZOFRAN-ODT) 4 MG disintegrating tablet Take 1 tablet (4 mg total) by mouth every 8 (eight) hours as needed for nausea. 12 tablet 0  ? ?No current facility-administered medications on file prior to visit.  ? ? ?Past Surgical History:  ?Procedure Laterality Date  ? CORNEAL TRANSPLANT  12/2009  ? KNEE ARTHROSCOPY  02/20/2012  ? Procedure: ARTHROSCOPY KNEE;  Surgeon: Alta Corning, MD;  Location: [place];  Service: Orthopedics;  Laterality: Right;  Chondroplasia patella femoral joint, medial meniscal debriedment  ? ? ?Allergies  ?Allergen Reactions  ? Shrimp [Shellfish Allergy] Anaphylaxis  ? ? ?There were no vitals taken for this visit. ? ?   ? View : No data to display.  ?  ?  ?  ? ? ?   ? View : No data to display.  ?  ?  ?  ? ? ?    ?Objective:  ? ?Physical Exam: ? ?General: NAD, comfortable in exam room ?Left shoulder exam:  ?- On inspection, no evidence of swelling.   ?- No pain with palpation elicited.   ?- Full range of motion is intact although pain with abduction beyond 90 degrees, both with active motion and against resistance. No pain with external or internal rotation.  5/5 muscle strength ?- Empty can test: Pain is elicited but no weakness against resistance.  ?- Hawkins-Kennedy test: Negative ?- Drop Arm: Negative ? ?Assessment & Plan:  ? ?1. Left Shoulder Pain: History of left shoulder pain with last visit in November 2022 at which time patient received left subacromial joint injection for suspected rotator cuff syndrome. Given examination today with painful arc and pain with empty can test, suspect patient is suffering from exacerbation of rotator cuff tendinopathy. No red flags to suggest full-thickness rotator cuff tear. Will start with conservative management with NSAIDs and strengthening exercises. If no improvement in 3 weeks, will consider ultrasound with possible steroid injection.  ? ?- Meloxicam 15 mg daily for 7-10 days. PRN  afterwards ?- Strengthening exercises provided ?- Follow up in 3 weeks  ? ?Discussed and seen with Dr. Rolena Infante.  ? ?Sports Medicine Fellow Addendum: ?  ?I have independently interviewed and examined the patient. I have discussed the above with the original author and agree with their documentation. My edits for correction/addition/clarification have been made, see any changes above and below. Any attestation will be made below from the attending.  ? ?In summary, 46 year-old male with history of RTC arthropathy with recent exacerbation. Exam most indicative of supraspinatus pathology, but intact strength - no concern for full-thickness tear. Will treat with meloxicam and HEP (spokes of wheel, Jobe's, RTC) and follow-up in 3 weeks if not improving. Next step would be considering additional imaging, I.e. ultrasound > x-ray.  ? ?Elba Barman, DO ?PGY-4, Sports Medicine Fellow ?[place] ? ?I was the preceptor for this visit and available for immediate consultation ?Shellia Cleverly, DO ?

## 2021-05-06 ENCOUNTER — Ambulatory Visit: Payer: BC Managed Care – PPO | Attending: Family Medicine | Admitting: Physical Therapy

## 2021-05-06 VITALS — BP 123/85 | HR 91

## 2021-05-06 DIAGNOSIS — R42 Dizziness and giddiness: Secondary | ICD-10-CM | POA: Insufficient documentation

## 2021-05-07 ENCOUNTER — Ambulatory Visit: Payer: BC Managed Care – PPO | Admitting: Family Medicine

## 2021-05-07 ENCOUNTER — Encounter: Payer: Self-pay | Admitting: Physical Therapy

## 2021-05-07 NOTE — Therapy (Signed)
[facility] ?[place], [place], [address] ?Phone: [phone]   Fax:  [phone] ? ?Physical Therapy Treatment ? ?Patient Details  ?Name: Derrick Scott ?MRN: [id number] ?Date of Birth: Mar 12, 1975 ?Referring Provider (PT): Zenia Resides, MD ? ? ?Encounter Date: 05/06/2021 ? ? PT End of Session - 05/07/21 1105   ? ? Visit Number 3   ? Number of Visits 5   ? Date for PT Re-Evaluation 04/09/21   ? Authorization Type BCBS   ? PT Start Time 1450   ? PT Stop Time 1522   ? PT Time Calculation (min) 32 min   ? Activity Tolerance Patient tolerated treatment well   ? Behavior During Therapy [facility] for tasks assessed/performed   ? ?  ?  ? ?  ? ? ?Past Medical History:  ?Diagnosis Date  ? Arthritis   ? knee bilateral  ? Asthma   ? as a child "NO attacks"  ? Diabetes mellitus without complication ([place])   ? border line no medicationa  ? GERD (gastroesophageal reflux disease)   ? High cholesterol   ? Medial meniscus tear 01/2012  ? right knee  ? Obesity   ? Pneumonia   ? Renal disorder   ? hematuria,kidney stones  ? Sleep apnea   ? Vertigo, benign paroxysmal 2015  ? ? ?Past Surgical History:  ?Procedure Laterality Date  ? CORNEAL TRANSPLANT  12/2009  ? KNEE ARTHROSCOPY  02/20/2012  ? Procedure: ARTHROSCOPY KNEE;  Surgeon: Alta Corning, MD;  Location: [facility];  Service: Orthopedics;  Laterality: Right;  Chondroplasia patella femoral joint, medial meniscal debriedment  ? ? ?Vitals:  ? 05/06/21 1510  ?BP: 123/85  ?Pulse: 91  ? ? ? Subjective Assessment - 05/07/21 1101   ? ? Subjective Pt reports he woke up with vertigo this morning; started getting nauseous so he did Epley maneuver for 3 reps (as he was previously instructed); pt states it seemed to get rid of the vertigo but he still wanted to come in today to see if it was really gone or still there   ? Pertinent History Arthritis, Asthma, DM, GERD, High Cholesterol, Obesity, Vertigo    ? Limitations Standing;Walking;House hold activities   ? Diagnostic tests None noted   ? Patient Stated Goals Resolve the Vertigo   ? Currently in Pain? No/denies   ? ?  ?  ? ?  ? ? ? ? ? ? ? ? ? ? Vestibular Assessment - 05/07/21 0001   ? ?  ? Symptom Behavior  ? Type of Dizziness  Spinning   ? Aggravating Factors Rolling to left   ? Relieving Factors Rest;Head stationary   ? Progression of Symptoms Better   since this am when he did Epley 3 times  ?  ? Positional Testing  ? Dix-Hallpike Dix-Hallpike Right;Dix-Hallpike Left   ? Sidelying Test Sidelying Right;Sidelying Left   ?  ? Dix-Hallpike Right  ? Dix-Hallpike Right Duration none   ? Dix-Hallpike Right Symptoms No nystagmus   ?  ? Dix-Hallpike Left  ? Dix-Hallpike Left Duration none   ? Dix-Hallpike Left Symptoms No nystagmus   ?  ? Sidelying Right  ? Sidelying Right Duration none   ? Sidelying Right Symptoms No nystagmus   ?  ? Sidelying Left  ? Sidelying Left Duration none   ? Sidelying Left Symptoms No nystagmus   ? ?  ?  ? ?  ? ? ? ? ? ? ? ? ? ? ? ? ? ? ? ? ? ? ? ? ? ?  PT Short Term Goals - 03/05/21 1247   ? ?  ? PT SHORT TERM GOAL #1  ? Title = LTGs   ? ?  ?  ? ?  ? ? ? ? PT Long Term Goals - 05/07/21 1109   ? ?  ? PT LONG TERM GOAL #1  ? Title Pt will be independent with initial vestibular/balance HEP   ? Baseline no HEP established   ? Time 4   ? Period Weeks   ? Status New   ? Target Date 04/09/21   ?  ? PT LONG TERM GOAL #2  ? Title LTG to be set for FGA/SOT   ? Baseline TBA   ? Time 4   ? Period Weeks   ? Status New   ?  ? PT LONG TERM GOAL #3  ? Title Pt will demo (-) positional testing and report understanding of home manuevers in case of reoccurence.   ? Time 4   ? Period Weeks   ? Status New   ? ?  ?  ? ?  ? ? ? ? ? ? ? ? Plan - 05/07/21 1105   ? ? Clinical Impression Statement All positional testing (-) in today's session with no nystagmus and no c/o vertigo in any position or with any movement.  Pt's symptoms and description of onset are  consistent with BPPV; pt reports he did Epley maneuver for 3 reps this am and this appears to have resolved vertiginous episode at this time.  No PT warranted as no vertigo reported.   ? Personal Factors and Comorbidities Comorbidity 3+;Time since onset of injury/illness/exacerbation   ? Comorbidities Arthritis, Asthma, GERD, High Cholesterol, Obesity, Vertigo   ? Examination-Activity Limitations Bed Mobility;Transfers;Stairs;Bend   ? Examination-Participation Restrictions Occupation;Community Activity   ? Stability/Clinical Decision Making Stable/Uncomplicated   ? Rehab Potential Good   ? PT Frequency 1x / week   ? PT Duration 4 weeks   ? PT Treatment/Interventions ADLs/Self Care Home Management;Canalith Repostioning;Therapeutic activities;Therapeutic exercise;Balance training;Gait training;Stair training;Neuromuscular re-education;Patient/family education;Manual techniques;Dry needling;Vestibular   ? Consulted and Agree with Plan of Care Patient   ? ?  ?  ? ?  ? ? ?Patient will benefit from skilled therapeutic intervention in order to improve the following deficits and impairments:  Decreased balance, Decreased activity tolerance, Dizziness ? ?Visit Diagnosis: ?Dizziness and giddiness ? ? ? ? ?Problem List ?Patient Active Problem List  ? Diagnosis Date Noted  ? Rhinitis 03/04/2021  ? Chronic left shoulder pain 12/16/2020  ? Screening for malignant neoplasm of prostate 12/16/2020  ? Chronic midline low back pain without sciatica 12/16/2020  ? Prediabetes 12/25/2019  ? Internal hemorrhoids without complication 49/44/9675  ? Other tear of medial meniscus, current injury, left knee, subsequent encounter 05/04/2017  ? Pes planus 02/07/2017  ? Laryngopharyngeal reflux (LPR) 07/24/2015  ? Post corneal transplant 07/25/2014  ? Vertigo 08/30/2013  ? Adrenal mass, right ([place]) 05/22/2013  ? Sleep apnea 07/31/2009  ? OBESITY, NOS 03/16/2006  ? ? ?Derrick Scott, PT ?05/07/2021, 11:09 AM ? ?[facility] ?[place] ?[place], [place], [address] ?Phone: [phone]   Fax:  [phone] ? ?Name: Derrick Scott ?MRN: [id number] ?Date of Birth: [birth year] ? ? ? ?

## 2021-05-21 ENCOUNTER — Ambulatory Visit: Payer: BC Managed Care – PPO | Admitting: Family Medicine

## 2021-06-03 ENCOUNTER — Ambulatory Visit: Payer: BC Managed Care – PPO | Admitting: Physical Therapy

## 2021-06-22 ENCOUNTER — Encounter: Payer: Self-pay | Admitting: *Deleted

## 2021-07-02 ENCOUNTER — Ambulatory Visit (INDEPENDENT_AMBULATORY_CARE_PROVIDER_SITE_OTHER): Payer: BC Managed Care – PPO | Admitting: Family Medicine

## 2021-07-02 VITALS — BP 125/60 | HR 98 | Ht 68.0 in | Wt 301.0 lb

## 2021-07-02 DIAGNOSIS — M25551 Pain in right hip: Secondary | ICD-10-CM

## 2021-07-02 MED ORDER — KETOROLAC TROMETHAMINE 30 MG/ML IJ SOLN
30.0000 mg | Freq: Once | INTRAMUSCULAR | Status: AC
  Administered 2021-07-02: 30 mg via INTRAMUSCULAR

## 2021-07-02 MED ORDER — ACETAMINOPHEN ER 650 MG PO TBCR: 1300.0000 mg | EXTENDED_RELEASE_TABLET | Freq: Three times a day (TID) | ORAL | 0 refills | Status: AC

## 2021-07-02 MED ORDER — DICLOFENAC SODIUM 1 % EX GEL: 4.0000 g | Freq: Four times a day (QID) | CUTANEOUS | 1 refills | Status: DC

## 2021-07-02 MED ORDER — MELOXICAM 15 MG PO TABS: 15.0000 mg | ORAL_TABLET | Freq: Every day | ORAL | 0 refills | Status: DC | PRN

## 2021-07-02 NOTE — Progress Notes (Unsigned)
    SUBJECTIVE:   CHIEF COMPLAINT / HPI: Hip pain  Patient reports chronic right hip pain.  It worsens 3 days ago.  Reports increased flexion with getting in and out of the vehicle and increased walking with job.  Took ibuprofen 800 mg which seemed to help a little.  Pain starts at right lower back extends around right groin area.  Denies any fevers, difficulty voiding, incontinence of bowel or bladder, numbness, tingling or weakness of lower extremities.  Has used pillows between legs but still finds it uncomfortable to sleep at night.  Has taken meloxicam in the past with some relief.  PERTINENT  PMH / PSH:  Obesity class III Chronic midline low back pain without sciatica  OBJECTIVE:   BP 125/60   Pulse 98   Ht '5\' 8"'$  (1.727 m)   Wt (!) 301 lb (136.5 kg)   SpO2 98%   BMI 45.77 kg/m    General: Alert, no acute distress Back - Normal skin, Spine with normal alignment and no deformity.  No tenderness to vertebral process palpation.  Paraspinous muscles are not tender and without spasm.   Range of motion is full at neck and lumbar sacral regions Negative straight leg raise test bilaterally Bilateral hip exam No deformity. FROM with 5/5 strength. No tenderness to palpation. NVI distally. Negative logroll Positive faber, and piriformis stretches.  Negative FADIR   ASSESSMENT/PLAN:   Right hip pain Increase repetitive motion causing musculoskeletal strain.  Also increase contributory to increase muscle strain.  No red flags -Toradol 30 mg IM x1 -Start Tylenol 1300 mg 3 times daily -Meloxicam 15 mg daily -Diclofenac gel 4 times daily as needed -Heat/ice as needed as needed -Gentle hip exercises 3-4 times daily, may need more structured physical therapy in future -Discussed healthy lifestyle and weight management.  Follow-up with PCP -Follow-up with PCP in 2 to 4 weeks, if pain continues could consider imaging at that time.     Carollee Leitz, MD [place]

## 2021-07-02 NOTE — Patient Instructions (Signed)
Thank you for coming to see me today. It was a pleasure.   Take Meloxicam 15 mg daily.  Start tomorrow as you will have a Toradol injection today Do not take any Ibuprofen today  Start Tylenol 1300 mg three times a day Use Diclofenac gel 4 times a day as needed Heat and Ice as needed  Gentle hip exercises 3-4 times a day  Please follow-up with PCP in 4 weeks or sooner if worsens  If you have any questions or concerns, please do not hesitate to call the office at [phone].  Best,   Carollee Leitz, MD    Hip Exercises Ask your health care provider which exercises are safe for you. Do exercises exactly as told by your health care provider and adjust them as directed. It is normal to feel mild stretching, pulling, tightness, or discomfort as you do these exercises. Stop right away if you feel sudden pain or your pain gets worse. Do not begin these exercises until told by your health care provider. Stretching and range-of-motion exercises These exercises warm up your muscles and joints and improve the movement and flexibility of your hip. These exercises also help to relieve pain, numbness, and tingling. You may be asked to limit your range of motion if you had a hip replacement. Talk to your health care provider about these restrictions. Hamstrings, supine  Lie on your back (supine position). Loop a belt or towel over the ball of your left / right foot. The ball of your foot is on the walking surface, right under your toes. Straighten your left / right knee and slowly pull on the belt or towel to raise your leg until you feel a gentle stretch behind your knee (hamstring). Do not let your knee bend while you do this. Keep your other leg flat on the floor. Hold this position for __________ seconds. Slowly return your leg to the starting position. Repeat __________ times. Complete this exercise __________ times a day. Hip rotation  Lie on your back on a firm surface. With your left /  right hand, gently pull your left / right knee toward the shoulder that is on the same side of the body. Stop when your knee is pointing toward the ceiling. Hold your left / right ankle with your other hand. Keeping your knee steady, gently pull your left / right ankle toward your other shoulder until you feel a stretch in your buttocks. Keep your hips and shoulders firmly planted while you do this stretch. Hold this position for __________ seconds. Repeat __________ times. Complete this exercise __________ times a day. Seated stretch This exercise is sometimes called hamstrings and adductors stretch. Sit on the floor with your legs stretched wide. Keep your knees straight during this exercise. Keeping your head and back in a straight line, bend at your waist to reach for your left foot (position A). You should feel a stretch in your right inner thigh (adductors). Hold this position for __________ seconds. Then slowly return to the upright position. Keeping your head and back in a straight line, bend at your waist to reach forward (position B). You should feel a stretch behind both of your thighs and knees (hamstrings). Hold this position for __________ seconds. Then slowly return to the upright position. Keeping your head and back in a straight line, bend at your waist to reach for your right foot (position C). You should feel a stretch in your left inner thigh (adductors). Hold this position for __________  seconds. Then slowly return to the upright position. Repeat __________ times. Complete this exercise __________ times a day. Lunge This exercise stretches the muscles of the hip (hip flexors). Place your left / right knee on the floor and bend your other knee so that is directly over your ankle. You should be half-kneeling. Keep good posture with your head over your shoulders. Tighten your buttocks to point your tailbone downward. This will prevent your back from arching too much. You should  feel a gentle stretch in the front of your left / right thigh and hip. If you do not feel a stretch, slide your other foot forward slightly and then slowly lunge forward with your chest up until your knee once again lines up over your ankle. Make sure your tailbone continues to point downward. Hold this position for __________ seconds. Slowly return to the starting position. Repeat __________ times. Complete this exercise __________ times a day. Strengthening exercises These exercises build strength and endurance in your hip. Endurance is the ability to use your muscles for a long time, even after they get tired. Bridge This exercise strengthens the muscles of your hip (hip extensors). Lie on your back on a firm surface with your knees bent and your feet flat on the floor. Tighten your buttocks muscles and lift your bottom off the floor until the trunk of your body and your hips are level with your thighs. Do not arch your back. You should feel the muscles working in your buttocks and the back of your thighs. If you do not feel these muscles, slide your feet 1-2 inches (2.5-5 cm) farther away from your buttocks. Hold this position for __________ seconds. Slowly lower your hips to the starting position. Let your muscles relax completely between repetitions. Repeat __________ times. Complete this exercise __________ times a day. Straight leg raises, side-lying This exercise strengthens the muscles that move the hip joint away from the center of the body (hip abductors). Lie on your side with your left / right leg in the top position. Lie so your head, shoulder, hip, and knee line up. You may bend your bottom knee slightly to help you balance. Roll your hips slightly forward, so your hips are stacked directly over each other and your left / right knee is facing forward. Leading with your heel, lift your top leg 4-6 inches (10-15 cm). You should feel the muscles in your top hip lifting. Do not let  your foot drift forward. Do not let your knee roll toward the ceiling. Hold this position for __________ seconds. Slowly return to the starting position. Let your muscles relax completely between repetitions. Repeat __________ times. Complete this exercise __________ times a day. Straight leg raises, side-lying This exercise strengthens the muscles that move the hip joint toward the center of the body (hip adductors). Lie on your side with your left / right leg in the bottom position. Lie so your head, shoulder, hip, and knee line up. You may place your upper foot in front to help you balance. Roll your hips slightly forward, so your hips are stacked directly over each other and your left / right knee is facing forward. Tense the muscles in your inner thigh and lift your bottom leg 4-6 inches (10-15 cm). Hold this position for __________ seconds. Slowly return to the starting position. Let your muscles relax completely between repetitions. Repeat __________ times. Complete this exercise __________ times a day. Straight leg raises, supine This exercise strengthens the muscles in the front  of your thigh (quadriceps). Lie on your back (supine position) with your left / right leg extended and your other knee bent. Tense the muscles in the front of your left / right thigh. You should see your kneecap slide up or see increased dimpling just above your knee. Keep these muscles tight as you raise your leg 4-6 inches (10-15 cm) off the floor. Do not let your knee bend. Hold this position for __________ seconds. Keep these muscles tense as you lower your leg. Relax the muscles slowly and completely between repetitions. Repeat __________ times. Complete this exercise __________ times a day. Hip abductors, standing This exercise strengthens the muscles that move the leg and hip joint away from the center of the body (hip abductors). Tie one end of a rubber exercise band or tubing to a secure surface,  such as a chair, table, or pole. Loop the other end of the band or tubing around your left / right ankle. Keeping your ankle with the band or tubing directly opposite the secured end, step away until there is tension in the tubing or band. Hold on to a chair, table, or pole as needed for balance. Lift your left / right leg out to your side. While you do this: Keep your back upright. Keep your shoulders over your hips. Keep your toes pointing forward. Make sure to use your hip muscles to slowly lift your leg. Do not tip your body or forcefully lift your leg. Hold this position for __________ seconds. Slowly return to the starting position. Repeat __________ times. Complete this exercise __________ times a day. Squats This exercise strengthens the muscles in the front of your thigh (quadriceps). Stand in a door frame so your feet and knees are in line with the frame. You may place your hands on the frame for balance. Slowly bend your knees and lower your hips like you are going to sit in a chair. Keep your lower legs in a straight-up-and-down position. Do not let your hips go lower than your knees. Do not bend your knees lower than told by your health care provider. If your hip pain increases, do not bend as low. Hold this position for ___________ seconds. Slowly push with your legs to return to standing. Do not use your hands to pull yourself to standing. Repeat __________ times. Complete this exercise __________ times a day. This information is not intended to replace advice given to you by your health care provider. Make sure you discuss any questions you have with your health care provider. Document Revised: 05/20/2020 Document Reviewed: 05/20/2020 Elsevier Patient Education  [place].

## 2021-07-05 ENCOUNTER — Encounter: Payer: Self-pay | Admitting: Family Medicine

## 2021-07-05 ENCOUNTER — Telehealth: Payer: Self-pay

## 2021-07-05 NOTE — Telephone Encounter (Signed)
Prior Auth for patients medication DICLOFENAC GEL denied by [facility] via CoverMyMeds.   Reason: requested drug may be covered when it is used for osteoarthritis pain in joints susceptible to topical treatment, such as feet, ankles, knees, hands, wrists, or elbows.  Patient notified.  CoverMyMeds Key: Skip Estimable

## 2021-07-05 NOTE — Assessment & Plan Note (Addendum)
Increase repetitive motion causing musculoskeletal strain.  Also increase contributory to increase muscle strain.  No red flags -Toradol 30 mg IM x1 -Start Tylenol 1300 mg 3 times daily -Meloxicam 15 mg daily -Diclofenac gel 4 times daily as needed -Heat/ice as needed as needed -Gentle hip exercises 3-4 times daily, may need more structured physical therapy in future -Discussed healthy lifestyle and weight management.  Follow-up with PCP -Follow-up with PCP in 2 to 4 weeks, if pain continues could consider imaging at that time.

## 2021-07-05 NOTE — Telephone Encounter (Signed)
A Prior Authorization was initiated for this patients DICLOFENAC GEL through CoverMyMeds.   Key: Skip Estimable

## 2021-07-06 ENCOUNTER — Telehealth: Payer: Self-pay

## 2021-07-06 NOTE — Telephone Encounter (Signed)
Called patient and provided with update. Patient appreciative.   Talbot Grumbling, RN

## 2021-07-06 NOTE — Telephone Encounter (Signed)
Sent letter as requested.   Carollee Leitz, MD [facility]

## 2021-07-06 NOTE — Telephone Encounter (Signed)
Patient calls nurse line requesting extension on work note. Patient reports that he is having continued hip pain and is asking for work letter to be extended to return to work on Thursday, 6/22.  Patient is asking that letter be sent on mychart if possible.   Forwarding to Dr. Volanda Napoleon (evaluated patient on 6/16)  Talbot Grumbling, RN

## 2021-07-29 ENCOUNTER — Ambulatory Visit: Payer: BC Managed Care – PPO | Admitting: Sports Medicine

## 2021-08-09 ENCOUNTER — Other Ambulatory Visit: Payer: Self-pay | Admitting: Family Medicine

## 2021-08-09 MED ORDER — SULFACETAMIDE SODIUM 10 % OP SOLN: 1.0000 [drp] | Freq: Four times a day (QID) | OPHTHALMIC | 0 refills | Status: AC

## 2021-08-09 NOTE — Progress Notes (Signed)
Received message he wants to try Ozempic. He will need office visit He also has conjunctivitis--small amount dc, no pain. Will try 5 days abx

## 2021-08-16 LAB — HM DIABETES EYE EXAM

## 2021-09-21 ENCOUNTER — Encounter: Payer: Self-pay | Admitting: Nurse Practitioner

## 2021-09-21 ENCOUNTER — Ambulatory Visit (INDEPENDENT_AMBULATORY_CARE_PROVIDER_SITE_OTHER): Payer: BC Managed Care – PPO | Admitting: Nurse Practitioner

## 2021-09-21 VITALS — BP 124/86 | HR 107 | Temp 96.5°F | Ht 68.0 in | Wt 303.6 lb

## 2021-09-21 DIAGNOSIS — G4733 Obstructive sleep apnea (adult) (pediatric): Secondary | ICD-10-CM | POA: Diagnosis not present

## 2021-09-21 DIAGNOSIS — K219 Gastro-esophageal reflux disease without esophagitis: Secondary | ICD-10-CM | POA: Diagnosis not present

## 2021-09-21 DIAGNOSIS — Z8042 Family history of malignant neoplasm of prostate: Secondary | ICD-10-CM | POA: Insufficient documentation

## 2021-09-21 DIAGNOSIS — E1165 Type 2 diabetes mellitus with hyperglycemia: Secondary | ICD-10-CM | POA: Diagnosis not present

## 2021-09-21 DIAGNOSIS — Z803 Family history of malignant neoplasm of breast: Secondary | ICD-10-CM | POA: Insufficient documentation

## 2021-09-21 LAB — POCT GLYCOSYLATED HEMOGLOBIN (HGB A1C): Hemoglobin A1C: 7.4 % — AB (ref 4.0–5.6)

## 2021-09-21 MED ORDER — OMEPRAZOLE 20 MG PO CPDR: 20.0000 mg | DELAYED_RELEASE_CAPSULE | Freq: Two times a day (BID) | ORAL | 0 refills | Status: DC

## 2021-09-21 MED ORDER — METFORMIN HCL 500 MG PO TABS: 250.0000 mg | ORAL_TABLET | Freq: Two times a day (BID) | ORAL | 5 refills | Status: DC

## 2021-09-21 NOTE — Assessment & Plan Note (Signed)
Chronic, waxing and waning, describes as globus hyestricus, heartburn, nausea and occassional vomiting No improvement with tums, pepto bismol, and mylanta symptoms improved with Use of pepcid prn and diet modification (low fat). Started last week.  no previous upper endoscopy.  Check H. Pylori today Start prilosec '20mg'$  BID x 2weeks Ref to GI if no improvement in 2weeks

## 2021-09-21 NOTE — Assessment & Plan Note (Signed)
>>  ASSESSMENT AND PLAN FOR SLEEP APNEA WRITTEN ON 09/21/2021  2:51 PM BY NCHE, CHARLOTTE LUM, NP  Sleep study 2020: Severe obstructive sleep apnea occurred during the diagnostic portion of the study (AHI = 34.4/hour). An optimal PAP pressure was selected for this patient ( 10 cm of water). No significant central sleep apnea occurred during the diagnostic portion of the study (CAI = 0.0/hour). Moderate oxygen desaturation was noted during the diagnostic portion of the study (Min O2 =87.0%). The patient snored with loud snoring volume during the diagnostic portion of the study.  Reports he uses CPAP machine daily, but continues to struggle with insomnia and daytime fatigue Entered referral to sleep clinic-[facility].

## 2021-09-21 NOTE — Assessment & Plan Note (Signed)
hgbA1c of 7.4% today. We discussed what this diagnosis means and possible complications, the necessary lifestyle modification and use of medication. He agreed to referral to nutritionist and use of metformin. Advised about possible side effects of metformin.  Metformin '250mg'$  BID sent. Entered referral to nutritionist. Provided printed information on DM and nutrition

## 2021-09-21 NOTE — Progress Notes (Signed)
New patient visit  Patient: Derrick Scott   DOB: 07-03-1975   46 y.o. Male  MRN: [id number] Visit Date: 09/21/2021  Subjective:    Chief Complaint  Patient presents with   [place] pt, est care  GERD Wants to do blood work, pt is fasting  Flu shot given today   Derrick Scott is a 46 y.o. male who presents today as a new patient to establish care.  HPI   Breast cancer: paternal aunts, paternal GM,  Prostate cancer: paternal uncle.  Family History  Problem Relation Age of Onset   Asthma Mother    Diabetes Father    Cancer Father        prostate   Osteoarthritis Sister    Diabetes Sister    Colon cancer Neg Hx    Colon polyps Neg Hx    Esophageal cancer Neg Hx    Rectal cancer Neg Hx    Stomach cancer Neg Hx     Most recent functional status assessment:     No data to display          Most recent fall risk assessment:    09/21/2021    1:08 PM  [place] in the past year? 0  Number falls in past yr: 0  Injury with Fall? 0     Most recent depression screenings:    09/21/2021    1:50 PM 07/02/2021    9:36 AM  PHQ 2/9 Scores  PHQ - 2 Score 0 0  PHQ- 9 Score 3 1    Most recent cognitive screening:     No data to display          Sleep apnea Sleep study 2020: Severe obstructive sleep apnea occurred during the diagnostic portion of the study (AHI = 34.4/hour). An optimal PAP pressure was selected for this patient ( 10 cm of water). No significant central sleep apnea occurred during the diagnostic portion of the study (CAI = 0.0/hour). Moderate oxygen desaturation was noted during the diagnostic portion of the study (Min O2 =87.0%). The patient snored with loud snoring volume during the diagnostic portion of the study.  Reports he uses CPAP machine daily, but continues to struggle with insomnia and daytime fatigue Entered referral to sleep clinic-[facility].  Laryngopharyngeal reflux (LPR) Chronic, waxing and waning,  describes as globus hyestricus, heartburn, nausea and occassional vomiting No improvement with tums, pepto bismol, and mylanta symptoms improved with Use of pepcid prn and diet modification (low fat). Started last week.  no previous upper endoscopy.  Check H. Pylori today Start prilosec '20mg'$  BID x 2weeks Ref to GI if no improvement in 2weeks  DM (diabetes mellitus) ([place]) hgbA1c of 7.4% today. We discussed what this diagnosis means and possible complications, the necessary lifestyle modification and use of medication. He agreed to referral to nutritionist and use of metformin. Advised about possible side effects of metformin.  Metformin '250mg'$  BID sent. Entered referral to nutritionist. Provided printed information on DM and nutrition       10/24/2020   12:26 PM 01/06/2021    6:15 PM 01/12/2021    5:48 AM 03/12/2021    1:37 PM 09/21/2021    1:08 PM  Fall Risk  Falls in the past year?    1 0  Was there an injury with Fall?    0 0  Fall Risk Category Calculator    2 0  Fall Risk Category  Moderate Low  Patient Fall Risk Level Low fall risk Low fall risk Low fall risk Moderate fall risk    Outpatient Medications Prior to Visit  Medication Sig   atorvastatin (LIPITOR) 40 MG tablet Take 1 tablet (40 mg total) by mouth daily.   diclofenac Sodium (VOLTAREN) 1 % GEL Apply 4 g topically 4 (four) times daily.   fluticasone (FLONASE) 50 MCG/ACT nasal spray Place 2 sprays into both nostrils daily.   meclizine (ANTIVERT) 25 MG tablet Take 1 tablet (25 mg total) by mouth 3 (three) times daily as needed for dizziness.   meloxicam (MOBIC) 15 MG tablet Take 1 tablet (15 mg total) by mouth daily as needed for pain.   ondansetron (ZOFRAN-ODT) 4 MG disintegrating tablet Take 1 tablet (4 mg total) by mouth every 8 (eight) hours as needed for nausea.   No facility-administered medications prior to visit.   Allergies  Allergen Reactions   Shrimp [Shellfish Allergy] Anaphylaxis    Patient Care  Team: Kaiulani Sitton, Charlene Brooke, NP as PCP - General (Internal Medicine)  Review of Systems     Objective:  BP 124/86 (BP Location: Right Arm, Patient Position: Sitting, Cuff Size: Normal)   Pulse (!) 107   Temp (!) 96.5 F (35.8 C) (Temporal)   Ht '5\' 8"'$  (1.727 m)   Wt (!) 303 lb 9.6 oz (137.7 kg)   SpO2 97%   BMI 46.16 kg/m     BP Readings from Last 3 Encounters:  09/21/21 124/86  07/02/21 125/60  05/06/21 123/85   Wt Readings from Last 3 Encounters:  09/21/21 (!) 303 lb 9.6 oz (137.7 kg)  07/02/21 (!) 301 lb (136.5 kg)  04/28/21 287 lb (130.2 kg)   Physical Exam Vitals reviewed.  Constitutional:      General: He is not in acute distress.    Appearance: He is well-developed. He is obese.  Cardiovascular:     Rate and Rhythm: Normal rate and regular rhythm.     Pulses: Normal pulses.     Heart sounds: Normal heart sounds.  Pulmonary:     Effort: Pulmonary effort is normal.     Breath sounds: Normal breath sounds.  Neurological:     Mental Status: He is alert and oriented to person, place, and time.     Deep Tendon Reflexes: Reflexes are normal and symmetric.    Results for orders placed or performed in visit on 09/21/21  POCT glycosylated hemoglobin (Hb A1C)  Result Value Ref Range   Hemoglobin A1C 7.4 (A) 4.0 - 5.6 %      Assessment & Plan:    Problem List Items Addressed This Visit       Respiratory   Laryngopharyngeal reflux (LPR) - Primary    Chronic, waxing and waning, describes as globus hyestricus, heartburn, nausea and occassional vomiting No improvement with tums, pepto bismol, and mylanta symptoms improved with Use of pepcid prn and diet modification (low fat). Started last week.  no previous upper endoscopy.  Check H. Pylori today Start prilosec '20mg'$  BID x 2weeks Ref to GI if no improvement in 2weeks      Relevant Medications   omeprazole (PRILOSEC) 20 MG capsule   Other Relevant Orders   H. pylori breath test   Sleep apnea    Sleep study  2020: Severe obstructive sleep apnea occurred during the diagnostic portion of the study (AHI = 34.4/hour). An optimal PAP pressure was selected for this patient ( 10 cm of water). No significant central sleep apnea  occurred during the diagnostic portion of the study (CAI = 0.0/hour). Moderate oxygen desaturation was noted during the diagnostic portion of the study (Min O2 =87.0%). The patient snored with loud snoring volume during the diagnostic portion of the study.  Reports he uses CPAP machine daily, but continues to struggle with insomnia and daytime fatigue Entered referral to sleep clinic[facility].      Relevant Orders   Ambulatory referral to Sleep Studies     Endocrine   DM (diabetes mellitus) ([place])    hgbA1c of 7.4% today. We discussed what this diagnosis means and possible complications, the necessary lifestyle modification and use of medication. He agreed to referral to nutritionist and use of metformin. Advised about possible side effects of metformin.  Metformin '250mg'$  BID sent. Entered referral to nutritionist. Provided printed information on DM and nutrition      Relevant Medications   metFORMIN (GLUCOPHAGE) 500 MG tablet   Other Relevant Orders   POCT glycosylated hemoglobin (Hb A1C) (Completed)   Referral to Nutrition and Diabetes Services   Return in about 4 weeks (around 10/19/2021) for CPE (fasting), DM.     Wilfred Lacy, NP

## 2021-09-21 NOTE — Patient Instructions (Addendum)
Start prilosec '20mg'$  BID x 2weeks Start metformin '250mg'$  BID with meals Maintain diet modification. You will be contacted to schedule appt with sleep clinic and nutritionist. Go to lab.  Diabetes Mellitus and Nutrition, Adult When you have diabetes, or diabetes mellitus, it is very important to have healthy eating habits because your blood sugar (glucose) levels are greatly affected by what you eat and drink. Eating healthy foods in the right amounts, at about the same times every day, can help you: Manage your blood glucose. Lower your risk of heart disease. Improve your blood pressure. Reach or maintain a healthy weight. What can affect my meal plan? Every person with diabetes is different, and each person has different needs for a meal plan. Your health care provider may recommend that you work with a dietitian to make a meal plan that is best for you. Your meal plan may vary depending on factors such as: The calories you need. The medicines you take. Your weight. Your blood glucose, blood pressure, and cholesterol levels. Your activity level. Other health conditions you have, such as heart or kidney disease. How do carbohydrates affect me? Carbohydrates, also called carbs, affect your blood glucose level more than any other type of food. Eating carbs raises the amount of glucose in your blood. It is important to know how many carbs you can safely have in each meal. This is different for every person. Your dietitian can help you calculate how many carbs you should have at each meal and for each snack. How does alcohol affect me? Alcohol can cause a decrease in blood glucose (hypoglycemia), especially if you use insulin or take certain diabetes medicines by mouth. Hypoglycemia can be a life-threatening condition. Symptoms of hypoglycemia, such as sleepiness, dizziness, and confusion, are similar to symptoms of having too much alcohol. Do not drink alcohol if: Your health care provider tells  you not to drink. You are pregnant, may be pregnant, or are planning to become pregnant. If you drink alcohol: Limit how much you have to: 0-1 drink a day for women. 0-2 drinks a day for men. Know how much alcohol is in your drink. In the [place] one drink equals one 12 oz bottle of beer (355 mL), one 5 oz glass of wine (148 mL), or one 1 oz glass of hard liquor (44 mL). Keep yourself hydrated with water, diet soda, or unsweetened iced tea. Keep in mind that regular soda, juice, and other mixers may contain a lot of sugar and must be counted as carbs. What are tips for following this plan?  Reading food labels Start by checking the serving size on the Nutrition Facts label of packaged foods and drinks. The number of calories and the amount of carbs, fats, and other nutrients listed on the label are based on one serving of the item. Many items contain more than one serving per package. Check the total grams (g) of carbs in one serving. Check the number of grams of saturated fats and trans fats in one serving. Choose foods that have a low amount or none of these fats. Check the number of milligrams (mg) of salt (sodium) in one serving. Most people should limit total sodium intake to less than 2,300 mg per day. Always check the nutrition information of foods labeled as "low-fat" or "nonfat." These foods may be higher in added sugar or refined carbs and should be avoided. Talk to your dietitian to identify your daily goals for nutrients listed on the label. Shopping Avoid  buying canned, pre-made, or processed foods. These foods tend to be high in fat, sodium, and added sugar. Shop around the outside edge of the grocery store. This is where you will most often find fresh fruits and vegetables, bulk grains, fresh meats, and fresh dairy products. Cooking Use low-heat cooking methods, such as baking, instead of high-heat cooking methods, such as deep frying. Cook using healthy oils, such as olive,  canola, or sunflower oil. Avoid cooking with butter, cream, or high-fat meats. Meal planning Eat meals and snacks regularly, preferably at the same times every day. Avoid going long periods of time without eating. Eat foods that are high in fiber, such as fresh fruits, vegetables, beans, and whole grains. Eat 4-6 oz (112-168 g) of lean protein each day, such as lean meat, chicken, fish, eggs, or tofu. One ounce (oz) (28 g) of lean protein is equal to: 1 oz (28 g) of meat, chicken, or fish. 1 egg.  cup (62 g) of tofu. Eat some foods each day that contain healthy fats, such as avocado, nuts, seeds, and fish. What foods should I eat? Fruits Berries. Apples. Oranges. Peaches. Apricots. Plums. Grapes. Mangoes. Papayas. Pomegranates. Kiwi. Cherries. Vegetables Leafy greens, including lettuce, spinach, kale, chard, collard greens, mustard greens, and cabbage. Beets. Cauliflower. Broccoli. Carrots. Green beans. Tomatoes. Peppers. Onions. Cucumbers. Brussels sprouts. Grains Whole grains, such as whole-wheat or whole-grain bread, crackers, tortillas, cereal, and pasta. Unsweetened oatmeal. Quinoa. Brown or wild rice. Meats and other proteins Seafood. Poultry without skin. Lean cuts of poultry and beef. Tofu. Nuts. Seeds. Dairy Low-fat or fat-free dairy products such as milk, yogurt, and cheese. The items listed above may not be a complete list of foods and beverages you can eat and drink. Contact a dietitian for more information. What foods should I avoid? Fruits Fruits canned with syrup. Vegetables Canned vegetables. Frozen vegetables with butter or cream sauce. Grains Refined white flour and flour products such as bread, pasta, snack foods, and cereals. Avoid all processed foods. Meats and other proteins Fatty cuts of meat. Poultry with skin. Breaded or fried meats. Processed meat. Avoid saturated fats. Dairy Full-fat yogurt, cheese, or milk. Beverages Sweetened drinks, such as soda or  iced tea. The items listed above may not be a complete list of foods and beverages you should avoid. Contact a dietitian for more information. Questions to ask a health care provider Do I need to meet with a certified diabetes care and education specialist? Do I need to meet with a dietitian? What number can I call if I have questions? When are the best times to check my blood glucose? Where to find more information: American Diabetes Association: diabetes.org Academy of Nutrition and Dietetics: eatright.[employer] of Diabetes and Digestive and Kidney Diseases: [employer] Association of Diabetes Care & Education Specialists: diabeteseducator.org Summary It is important to have healthy eating habits because your blood sugar (glucose) levels are greatly affected by what you eat and drink. It is important to use alcohol carefully. A healthy meal plan will help you manage your blood glucose and lower your risk of heart disease. Your health care provider may recommend that you work with a dietitian to make a meal plan that is best for you. This information is not intended to replace advice given to you by your health care provider. Make sure you discuss any questions you have with your health care provider. Document Revised: 08/07/2019 Document Reviewed: 08/07/2019 Elsevier Patient Education  [place].

## 2021-09-21 NOTE — Assessment & Plan Note (Signed)
Sleep study 2020: Severe obstructive sleep apnea occurred during the diagnostic portion of the study (AHI = 34.4/hour). An optimal PAP pressure was selected for this patient ( 10 cm of water). No significant central sleep apnea occurred during the diagnostic portion of the study (CAI = 0.0/hour). Moderate oxygen desaturation was noted during the diagnostic portion of the study (Min O2 =87.0%). The patient snored with loud snoring volume during the diagnostic portion of the study.  Reports he uses CPAP machine daily, but continues to struggle with insomnia and daytime fatigue Entered referral to [facility] clinic[facility].

## 2021-09-22 LAB — H. PYLORI BREATH TEST: H. pylori Breath Test: NOT DETECTED

## 2021-09-24 ENCOUNTER — Telehealth: Payer: Self-pay | Admitting: Nurse Practitioner

## 2021-09-24 NOTE — Telephone Encounter (Signed)
FYI:Pt called in complaining of him having extreme stomach pain for the past few nights. He feels he is having a reaction to one of his new meds he has just started taking. I transferred him over to nurse triage.

## 2021-09-24 NOTE — Telephone Encounter (Signed)
Pt advised.

## 2021-10-01 ENCOUNTER — Encounter: Payer: Self-pay | Admitting: Nurse Practitioner

## 2021-10-07 ENCOUNTER — Ambulatory Visit: Payer: BC Managed Care – PPO | Admitting: Dietician

## 2021-10-27 ENCOUNTER — Encounter: Payer: Self-pay | Admitting: Neurology

## 2021-10-27 ENCOUNTER — Ambulatory Visit (INDEPENDENT_AMBULATORY_CARE_PROVIDER_SITE_OTHER): Payer: BC Managed Care – PPO | Admitting: Neurology

## 2021-10-27 ENCOUNTER — Encounter: Payer: Self-pay | Admitting: Nurse Practitioner

## 2021-10-27 VITALS — BP 154/94 | HR 95 | Ht 68.0 in | Wt 305.5 lb

## 2021-10-27 DIAGNOSIS — Z72821 Inadequate sleep hygiene: Secondary | ICD-10-CM

## 2021-10-27 DIAGNOSIS — F5104 Psychophysiologic insomnia: Secondary | ICD-10-CM | POA: Insufficient documentation

## 2021-10-27 DIAGNOSIS — G4733 Obstructive sleep apnea (adult) (pediatric): Secondary | ICD-10-CM

## 2021-10-27 NOTE — Progress Notes (Signed)
SLEEP MEDICINE CLINIC    Provider:  Larey Seat, MD  Primary Care Physician:  Flossie Buffy, NP [place] [address]     Referring Provider: Flossie Buffy, [place],  [place] [address]          Chief Complaint according to patient   Patient presents with:     New Patient (Initial Visit)     Primary Neurologist is Dr Loretta Plume, DO       HISTORY OF PRESENT ILLNESS:  Derrick Scott is a 46 y.o. year old 89 or African American male patient seen hereupon a referral by primary care  on  10/27/2021  to transfer CPAP care.   Chief concern according to patient :   This patient brought his CPAP, which was prescribed  after a [place] sleep physician  Dr. Baird Lyons on 06-21-2018 and was never followed.     I have the pleasure of seeing Derrick Scott for an already diagnosed and treated sleep apnea  disorder.  He  has a past medical history of Arthritis, Asthma, Diabetes mellitus without complication ([place]), GERD (gastroesophageal reflux disease), High cholesterol, Medial meniscus tear (01/2012), Obesity, Pneumonia, Renal disorder, Sleep apnea, and Vertigo, benign paroxysmal (2015).   The patient had the first sleep study in the year 2020  with a result of an AHI ( Apnea Hypopnea index)  of 47.7, a nearly all hypopneas. He was titrated to 10 cm water pressure, FFM.    Sleep relevant medical history:CPAP reduced  Nocturia, he sleeps longer, he feels more refreshed - but he reports bothersome air leaks, sometimes it feels as if there is not enough flow , his migraines have completely stopped since CPAP was initiated. Migraines used to be associated with photo-phono phobia and nausea.     Family medical /sleep history: no other family member on CPAP with OSA, insomnia, sleep walkers.    Social history:  Patient is working as Environmental health practitioner for [employer] and lives in a household with spouse , the couple has grown  children, one dog. Tobacco use-never .  ETOH use ;none ,  Caffeine intake in form of Coffee( /) Soda( pre-work out ) Tea ( 2 glasses ) or energy drinks. Regular exercise in form of gym.         Sleep habits are as follows: The patient's dinner time is between 8-9 PM. The patient goes to bed at 12 PM - 1 AM and continues to sleep for 6-7 hours, wakes not for bathroom breaks..   The preferred sleep position is prone but mask interferes. , with the support of 1-2 pillows.  Dreams are reportedly frequent/vivid.    8 AM is the usual rise time. The patient wakes up spontaneously around 7.30.  He reports not feeling refreshed or restored in AM, with symptoms such as dry mouth, rare morning headaches, and residual fatigue. Naps are not taken - no opportunity- but he could if he would;  lasting from 1 to 2  hours, he has trouble to nap..    Review of Systems: Out of a complete 14 system review, the patient complains of only the following symptoms, and all other reviewed systems are negative.:  Fatigue, sleepiness , snoring, fragmented sleep, Insomnia for sleep initiation. Mind is racing, he is on a screen at night. !   How likely are you to doze in the following situations: 0 = not likely, 1 = slight chance,  2 = moderate chance, 3 = high chance   Sitting and Reading? Watching Television? Sitting inactive in a public place (theater or meeting)? As a passenger in a car for an hour without a break? Lying down in the afternoon when circumstances permit? Sitting and talking to someone? Sitting quietly after lunch without alcohol? In a car, while stopped for a few minutes in traffic?   Total = 4/ 24 points   FSS endorsed at 17/ 63 points.   Depression score :   Social History   Socioeconomic History   Marital status: Married    Spouse name: Not on file   Number of children: Not on file   Years of education: Not on file   Highest education level: HS and [employer] juvenile justice   Occupational  History   Not on file  Tobacco Use   Smoking status: Never   Smokeless tobacco: Never  Vaping Use   Vaping Use: Never used  Substance and Sexual Activity   Alcohol use: No   Drug use: No   Sexual activity: Not on file  Other Topics Concern   Not on file  Social History Narrative   Right handed   Drinks caffeine   One story home   Social Determinants of Health   Financial Resource Strain: Not on file  Food Insecurity: Not on file  Transportation Needs: Not on file  Physical Activity: Not on file  Stress: Not on file  Social Connections: Not on file    Family History  Problem Relation Age of Onset   Asthma Mother    Diabetes Father    Cancer Father        prostate   Osteoarthritis Sister    Diabetes Sister    Colon cancer Neg Hx    Colon polyps Neg Hx    Esophageal cancer Neg Hx    Rectal cancer Neg Hx    Stomach cancer Neg Hx     Past Medical History:  Diagnosis Date   Arthritis    knee bilateral   Asthma    as a child "NO attacks"   Diabetes mellitus without complication ([facility])    border line no medicationa   GERD (gastroesophageal reflux disease)    High cholesterol    Medial meniscus tear 01/2012   right knee   Obesity    Pneumonia    Renal disorder    hematuria,kidney stones   Sleep apnea    Vertigo, benign paroxysmal 2015    Past Surgical History:  Procedure Laterality Date   CORNEAL TRANSPLANT  12/2009   KNEE ARTHROSCOPY  02/20/2012   Procedure: ARTHROSCOPY KNEE;  Surgeon: Alta Corning, MD;  Location: [facility];  Service: Orthopedics;  Laterality: Right;  Chondroplasia patella femoral joint, medial meniscal debriedment     Current Outpatient Medications on File Prior to Visit  Medication Sig Dispense Refill   atorvastatin (LIPITOR) 40 MG tablet Take 1 tablet (40 mg total) by mouth daily. 90 tablet 3   diclofenac Sodium (VOLTAREN) 1 % GEL Apply 4 g topically 4 (four) times daily. 150 g 1   fluticasone (FLONASE) 50 MCG/ACT  nasal spray Place 2 sprays into both nostrils daily. 16 g 6   meclizine (ANTIVERT) 25 MG tablet Take 1 tablet (25 mg total) by mouth 3 (three) times daily as needed for dizziness. 30 tablet 0   meloxicam (MOBIC) 15 MG tablet Take 1 tablet (15 mg total) by mouth daily as needed for pain. [place]  tablet 0   metFORMIN (GLUCOPHAGE) 500 MG tablet Take 0.5 tablets (250 mg total) by mouth 2 (two) times daily with a meal. 30 tablet 5   omeprazole (PRILOSEC) 20 MG capsule Take 1 capsule (20 mg total) by mouth 2 (two) times daily before a meal. 30 capsule 0   ondansetron (ZOFRAN-ODT) 4 MG disintegrating tablet Take 1 tablet (4 mg total) by mouth every 8 (eight) hours as needed for nausea. 12 tablet 0   No current facility-administered medications on file prior to visit.    Allergies  Allergen Reactions   Shrimp [Shellfish Allergy] Anaphylaxis    Physical exam:  Today's Vitals   10/27/21 0849  BP: (!) 154/94  Pulse: 95  Weight: (!) 305 lb 8 oz (138.6 kg)  Height: '5\' 8"'$  (1.727 m)   Body mass index is 46.45 kg/m.   Wt Readings from Last 3 Encounters:  10/27/21 (!) 305 lb 8 oz (138.6 kg)  09/21/21 (!) 303 lb 9.6 oz (137.7 kg)  07/02/21 (!) 301 lb (136.5 kg)     Ht Readings from Last 3 Encounters:  10/27/21 '5\' 8"'$  (1.727 m)  09/21/21 '5\' 8"'$  (1.727 m)  07/02/21 '5\' 8"'$  (1.727 m)      General: The patient is awake, alert and appears not in acute distress. The patient is well groomed. Head: Normocephalic, atraumatic. Neck is supple. Mallampati: 3,  neck circumference:19 inches . Nasal airflow  patent.  Retrognathia is mild-. Dental status:  Cardiovascular:  Regular rate and cardiac rhythm by pulse,  without distended neck veins. Respiratory: Lungs are clear to auscultation.  Skin:  Without evidence of ankle edema, or rash. Trunk: The patient's posture is erect.   Neurologic exam : The patient is awake and alert, oriented to place and time.   Memory subjective described as intact.  Attention  span & concentration ability appears normal.  Speech is fluent,  without  dysarthria, dysphonia or aphasia.  Mood and affect are appropriate.   Cranial nerves: no loss of smell or taste reported  Pupils are equal and briskly reactive to light. Funduscopic exam deferred.  Extraocular movements in vertical and horizontal planes were intact and without nystagmus. No Diplopia. Visual fields by finger perimetry are intact. Hearing was intact to soft voice and finger rubbing.    Facial sensation intact to fine touch.  Facial motor strength is symmetric and tongue and uvula move midline.  Neck ROM : rotation, tilt and flexion extension were normal for age and shoulder shrug was symmetrical.    Motor exam:  Symmetric bulk, tone and ROM.   Normal tone without cog- wheeling, symmetric grip strength .   Sensory:  Fine touch, pinprick and vibration were tested  and  normal.    Coordination: Rapid alternating movements in the fingers/hands were of normal speed.    Gait and station: Patient could rise unassisted from a seated position, walked without assistive device.  Stance is of wider base and the patient turned with 3 steps.  Toe and heel walk were deferred.  Deep tendon reflexes: in the  upper and lower extremities are absent / attenuated symmetric. Babinski response was deferred l        After spending a total time of  35  minutes face to face and additional time for physical and neurologic examination, review of laboratory studies,  personal review of imaging studies, reports and results of other testing and review of referral information / records as far as provided in visit, I have established  the following assessments:  1) This patient is currently treated with a CPAP that's 47 years old and he was tested in 2020 in [facility] night . study, had severe OSA ( Dr Annamaria Boots)  2) He uses an autotitration CPAP between 4 and 20 cm water, factory settings, 3 cm EPR and residual AHI of 0.5/h. 3) 95th %  is 13 cm water.  He is no longer snoring, has less migraines, no morning headaches and no nocturia.  4)Insomnia    My Plan is to proceed with:  1)For patient comfort, I would change the settings to 6 through 16 cm water, 3 cm EPR and  Rv in 12 months.  2) The patient can use melatonin 5 mg at night, eliminate screen time and we can offer trazodone if that's not working.   I would like to thank [place], Charlene Brooke, NP and Kendrick, Charlene Brooke, [place],  [place] [id number] for allowing me to meet with and to take care of this pleasant patient.   In short, Derrick Scott is presenting with Insomnia on CPAP / OSA  I plan to follow up either personally or through our NP within 12 months.  Electronically signed by: Larey Seat, MD 10/27/2021 9:18 AM  [facility] Board certified by The [employer] of Sleep Medicine and Diplomate of the [employer] of Sleep Medicine. Board certified In Neurology through the [employer], Fellow of the [employer] of Neurology. Medical Director of [employer].

## 2021-10-27 NOTE — Patient Instructions (Signed)
Insomnia Insomnia is a sleep disorder that makes it difficult to fall asleep or stay asleep. Insomnia can cause fatigue, low energy, difficulty concentrating, mood swings, and poor performance at work or school. There are three different ways to classify insomnia: Difficulty falling asleep. Difficulty staying asleep. Waking up too early in the morning. Any type of insomnia can be long-term (chronic) or short-term (acute). Both are common. Short-term insomnia usually lasts for 3 months or less. Chronic insomnia occurs at least three times a week for longer than 3 months. What are the causes? Insomnia may be caused by another condition, situation, or substance, such as: Having certain mental health conditions, such as anxiety and depression. Using caffeine, alcohol, tobacco, or drugs. Having gastrointestinal conditions, such as gastroesophageal reflux disease (GERD). Having certain medical conditions. These include: Asthma. Alzheimer's disease. Stroke. Chronic pain. An overactive thyroid gland (hyperthyroidism). Other sleep disorders, such as restless legs syndrome and sleep apnea. Menopause. Sometimes, the cause of insomnia may not be known. What increases the risk? Risk factors for insomnia include: Gender. Females are affected more often than males. Age. Insomnia is more common as people get older. Stress and certain medical and mental health conditions. Lack of exercise. Having an irregular work schedule. This may include working night shifts and traveling between different time zones. What are the signs or symptoms? If you have insomnia, the main symptom is having trouble falling asleep or having trouble staying asleep. This may lead to other symptoms, such as: Feeling tired or having low energy. Feeling nervous about going to sleep. Not feeling rested in the morning. Having trouble concentrating. Feeling irritable, anxious, or depressed. How is this diagnosed? This condition  may be diagnosed based on: Your symptoms and medical history. Your health care provider may ask about: Your sleep habits. Any medical conditions you have. Your mental health. A physical exam. How is this treated? Treatment for insomnia depends on the cause. Treatment may focus on treating an underlying condition that is causing the insomnia. Treatment may also include: Medicines to help you sleep. Counseling or therapy. Lifestyle adjustments to help you sleep better. Follow these instructions at home: Eating and drinking  Limit or avoid alcohol, caffeinated beverages, and products that contain nicotine and tobacco, especially close to bedtime. These can disrupt your sleep. Do not eat a large meal or eat spicy foods right before bedtime. This can lead to digestive discomfort that can make it hard for you to sleep. Sleep habits  Keep a sleep diary to help you and your health care provider figure out what could be causing your insomnia. Write down: When you sleep. When you wake up during the night. How well you sleep and how rested you feel the next day. Any side effects of medicines you are taking. What you eat and drink. Make your bedroom a dark, comfortable place where it is easy to fall asleep. Put up shades or blackout curtains to block light from outside. Use a white noise machine to block noise. Keep the temperature cool. Limit screen use before bedtime. This includes: Not watching TV. Not using your smartphone, tablet, or computer. Stick to a routine that includes going to bed and waking up at the same times every day and night. This can help you fall asleep faster. Consider making a quiet activity, such as reading, part of your nighttime routine. Try to avoid taking naps during the day so that you sleep better at night. Get out of bed if you are still awake after   15 minutes of trying to sleep. Keep the lights down, but try reading or doing a quiet activity. When you feel  sleepy, go back to bed. General instructions Take over-the-counter and prescription medicines only as told by your health care provider. Exercise regularly as told by your health care provider. However, avoid exercising in the hours right before bedtime. Use relaxation techniques to manage stress. Ask your health care provider to suggest some techniques that may work well for you. These may include: Breathing exercises. Routines to release muscle tension. Visualizing peaceful scenes. Make sure that you drive carefully. Do not drive if you feel very sleepy. Keep all follow-up visits. This is important. Contact a health care provider if: You are tired throughout the day. You have trouble in your daily routine due to sleepiness. You continue to have sleep problems, or your sleep problems get worse. Get help right away if: You have thoughts about hurting yourself or someone else. Get help right away if you feel like you may hurt yourself or others, or have thoughts about taking your own life. Go to your nearest emergency room or: Call 911. Call the National Suicide Prevention Lifeline at 1-800-273-8255 or 988. This is open 24 hours a day. Text the Crisis Text Line at 741741. Summary Insomnia is a sleep disorder that makes it difficult to fall asleep or stay asleep. Insomnia can be long-term (chronic) or short-term (acute). Treatment for insomnia depends on the cause. Treatment may focus on treating an underlying condition that is causing the insomnia. Keep a sleep diary to help you and your health care provider figure out what could be causing your insomnia. This information is not intended to replace advice given to you by your health care provider. Make sure you discuss any questions you have with your health care provider. Document Revised: 12/14/2020 Document Reviewed: 12/14/2020 Elsevier Patient Education  2023 Else[employer] Inc.  

## 2021-10-28 ENCOUNTER — Encounter: Payer: Self-pay | Admitting: Nurse Practitioner

## 2021-10-28 ENCOUNTER — Telehealth: Payer: Self-pay

## 2021-10-28 ENCOUNTER — Ambulatory Visit (INDEPENDENT_AMBULATORY_CARE_PROVIDER_SITE_OTHER): Payer: BC Managed Care – PPO | Admitting: Nurse Practitioner

## 2021-10-28 VITALS — BP 138/76 | HR 95 | Temp 97.7°F | Ht 68.0 in | Wt 302.4 lb

## 2021-10-28 DIAGNOSIS — M25562 Pain in left knee: Secondary | ICD-10-CM

## 2021-10-28 DIAGNOSIS — M549 Dorsalgia, unspecified: Secondary | ICD-10-CM | POA: Diagnosis not present

## 2021-10-28 DIAGNOSIS — K219 Gastro-esophageal reflux disease without esophagitis: Secondary | ICD-10-CM | POA: Diagnosis not present

## 2021-10-28 DIAGNOSIS — I10 Essential (primary) hypertension: Secondary | ICD-10-CM | POA: Insufficient documentation

## 2021-10-28 DIAGNOSIS — G8929 Other chronic pain: Secondary | ICD-10-CM

## 2021-10-28 DIAGNOSIS — M25561 Pain in right knee: Secondary | ICD-10-CM | POA: Diagnosis not present

## 2021-10-28 DIAGNOSIS — R030 Elevated blood-pressure reading, without diagnosis of hypertension: Secondary | ICD-10-CM

## 2021-10-28 MED ORDER — OMEPRAZOLE 20 MG PO CPDR: 20.0000 mg | DELAYED_RELEASE_CAPSULE | Freq: Two times a day (BID) | ORAL | 0 refills | Status: DC

## 2021-10-28 NOTE — Progress Notes (Signed)
Established Patient Visit  Patient: Derrick Scott   DOB: 1976/01/04   46 y.o. Male  MRN: [id number] Visit Date: 10/28/2021  Subjective:    Chief Complaint  Patient presents with   Acute Visit    Pt here for HTN check c/o headach and light chest pain maybe his lungs with dry cough.   Back Pain This is a new problem. The current episode started 1 to 4 weeks ago. The problem occurs intermittently. The problem is unchanged. The pain is present in the thoracic spine. The pain does not radiate. The pain is mild. The symptoms are aggravated by lying down. Pertinent negatives include no abdominal pain, bladder incontinence, bowel incontinence, chest pain, dysuria, fever, headaches, leg pain, numbness, paresis, paresthesias, pelvic pain, perianal numbness, tingling, weakness or weight loss. Risk factors include poor posture and obesity. He has tried nothing for the symptoms.  Exercise daily involves weight training (134lbs) and cardio (58mns)  Elevated BP without diagnosis of hypertension Elevated BP during neurology appt. But normal BP today and during appts with other providers. BP Readings from Last 3 Encounters:  10/28/21 138/76  10/27/21 (!) 154/94  09/21/21 124/86   possibly due to BP cuff size He is unable to find large cuff for home BP machine. Advised to use wrist cuff, maintain DASH diet and daily exercise.  Reviewed medical, surgical, and social history today  Medications: Outpatient Medications Prior to Visit  Medication Sig   atorvastatin (LIPITOR) 40 MG tablet Take 1 tablet (40 mg total) by mouth daily.   metFORMIN (GLUCOPHAGE) 500 MG tablet Take 0.5 tablets (250 mg total) by mouth 2 (two) times daily with a meal.   [DISCONTINUED] diclofenac Sodium (VOLTAREN) 1 % GEL Apply 4 g topically 4 (four) times daily. (Patient not taking: Reported on 10/28/2021)   [DISCONTINUED] fluticasone (FLONASE) 50 MCG/ACT nasal spray Place 2 sprays into both nostrils daily.  (Patient not taking: Reported on 10/28/2021)   [DISCONTINUED] meclizine (ANTIVERT) 25 MG tablet Take 1 tablet (25 mg total) by mouth 3 (three) times daily as needed for dizziness. (Patient not taking: Reported on 10/28/2021)   [DISCONTINUED] meloxicam (MOBIC) 15 MG tablet Take 1 tablet (15 mg total) by mouth daily as needed for pain. (Patient not taking: Reported on 10/28/2021)   [DISCONTINUED] omeprazole (PRILOSEC) 20 MG capsule Take 1 capsule (20 mg total) by mouth 2 (two) times daily before a meal. (Patient not taking: Reported on 10/28/2021)   [DISCONTINUED] ondansetron (ZOFRAN-ODT) 4 MG disintegrating tablet Take 1 tablet (4 mg total) by mouth every 8 (eight) hours as needed for nausea. (Patient not taking: Reported on 10/28/2021)   No facility-administered medications prior to visit.   Reviewed past medical and social history.   ROS per HPI above      Objective:  BP 138/76 (BP Location: Left Arm, Patient Position: Sitting, Cuff Size: Large)   Pulse 95   Temp 97.7 F (36.5 C) (Temporal)   Ht '5\' 8"'$  (1.727 m)   Wt (!) 302 lb 6.4 oz (137.2 kg)   SpO2 97%   BMI 45.98 kg/m      Physical Exam Vitals reviewed.  Constitutional:      Appearance: He is obese.  Cardiovascular:     Rate and Rhythm: Normal rate and regular rhythm.     Pulses: Normal pulses.     Heart sounds: Normal heart sounds.  Pulmonary:     Effort: Pulmonary effort  is normal.     Breath sounds: Normal breath sounds.  Chest:     Chest wall: No tenderness.  Abdominal:     General: Bowel sounds are normal.     Palpations: Abdomen is soft.     Tenderness: There is no right CVA tenderness or left CVA tenderness.  Musculoskeletal:     Right shoulder: Normal.     Left shoulder: Normal.     Cervical back: Normal.     Thoracic back: Normal.     Lumbar back: Normal.  Neurological:     Mental Status: He is alert and oriented to person, place, and time.     No results found for any visits on 10/28/21.     Assessment & Plan:    Problem List Items Addressed This Visit       Respiratory   Laryngopharyngeal reflux (LPR) - Primary   Relevant Medications   omeprazole (PRILOSEC) 20 MG capsule   Other Relevant Orders   Ambulatory referral to Gastroenterology     Other   Elevated BP without diagnosis of hypertension    Elevated BP during neurology appt. But normal BP today and during appts with other providers. BP Readings from Last 3 Encounters:  10/28/21 138/76  10/27/21 (!) 154/94  09/21/21 124/86   possibly due to BP cuff size He is unable to find large cuff for home BP machine. Advised to use wrist cuff, maintain DASH diet and daily exercise.      Other Visit Diagnoses     Pain, upper back       Chronic pain of both knees         Stop weight lifting Ok to continue cardio exercise: 67mns daily on treadmill or recumbent bicycle Stretch before and after exercise Use tylenol '500mg'$  every 8hrs as needed for pain Avoid NSAIDs due to uncontrolled GERD symptoms Resume omeprazole BID You will be contacted to schedule appt with GI  Return for maintain upcoming appt.     CWilfred Lacy NP

## 2021-10-28 NOTE — Patient Instructions (Signed)
Stop weight lifting Ok to continue cardio exercise: 52mns daily on treadmill or recumbent bicycle Stretch before and after exercise Use tylenol '500mg'$  every 8hrs as needed for pain Avoid NSAIDs due to uncontrolled GERD symptoms Resume omeprazole BID You will be contacted to schedule appt with GI

## 2021-10-28 NOTE — Assessment & Plan Note (Addendum)
Elevated BP during neurology appt. But normal BP today and during appts with other providers. BP Readings from Last 3 Encounters:  10/28/21 138/76  10/27/21 (!) 154/94  09/21/21 124/86   possibly due to BP cuff size He is unable to find large cuff for home BP machine. Advised to use wrist cuff, maintain DASH diet and daily exercise.

## 2021-11-08 ENCOUNTER — Encounter: Payer: BC Managed Care – PPO | Admitting: Nurse Practitioner

## 2021-11-08 ENCOUNTER — Telehealth: Payer: Self-pay | Admitting: Nurse Practitioner

## 2021-11-08 NOTE — Telephone Encounter (Signed)
Pt was a no show 10/23 for a CPE. This is his first, letter has been sent.

## 2021-11-09 NOTE — Telephone Encounter (Signed)
1st no show, fee waived, letter sent 

## 2021-11-30 ENCOUNTER — Encounter: Payer: Self-pay | Admitting: Nurse Practitioner

## 2021-11-30 ENCOUNTER — Ambulatory Visit (INDEPENDENT_AMBULATORY_CARE_PROVIDER_SITE_OTHER): Payer: BC Managed Care – PPO | Admitting: Nurse Practitioner

## 2021-11-30 VITALS — BP 140/100 | HR 106 | Temp 95.8°F | Ht 68.0 in | Wt 306.6 lb

## 2021-11-30 DIAGNOSIS — E1165 Type 2 diabetes mellitus with hyperglycemia: Secondary | ICD-10-CM

## 2021-11-30 DIAGNOSIS — E785 Hyperlipidemia, unspecified: Secondary | ICD-10-CM | POA: Diagnosis not present

## 2021-11-30 DIAGNOSIS — I10 Essential (primary) hypertension: Secondary | ICD-10-CM

## 2021-11-30 DIAGNOSIS — E1169 Type 2 diabetes mellitus with other specified complication: Secondary | ICD-10-CM | POA: Diagnosis not present

## 2021-11-30 DIAGNOSIS — Z6841 Body Mass Index (BMI) 40.0 and over, adult: Secondary | ICD-10-CM

## 2021-11-30 LAB — COMPREHENSIVE METABOLIC PANEL
ALT: 44 U/L (ref 0–53)
AST: 24 U/L (ref 0–37)
Albumin: 4.4 g/dL (ref 3.5–5.2)
Alkaline Phosphatase: 91 U/L (ref 39–117)
BUN: 12 mg/dL (ref 6–23)
CO2: 31 mEq/L (ref 19–32)
Calcium: 9.4 mg/dL (ref 8.4–10.5)
Chloride: 97 mEq/L (ref 96–112)
Creatinine, Ser: 1.09 mg/dL (ref 0.40–1.50)
GFR: 80.77 mL/min (ref >60.00)
Glucose, Bld: 158 mg/dL — ABNORMAL HIGH (ref 70–99)
Potassium: 3.9 mEq/L (ref 3.5–5.1)
Sodium: 134 mEq/L — ABNORMAL LOW (ref 135–145)
Total Bilirubin: 0.4 mg/dL (ref 0.2–1.2)
Total Protein: 7.9 g/dL (ref 6.0–8.3)

## 2021-11-30 LAB — LIPID PANEL
Cholesterol: 130 mg/dL (ref 0–200)
HDL: 37.5 mg/dL — ABNORMAL LOW (ref >39.00)
LDL Cholesterol: 60 mg/dL (ref 0–99)
NonHDL: 92.61
Total CHOL/HDL Ratio: 3
Triglycerides: 162 mg/dL — ABNORMAL HIGH (ref 0.0–149.0)
VLDL: 32.4 mg/dL (ref 0.0–40.0)

## 2021-11-30 LAB — MICROALBUMIN / CREATININE URINE RATIO
Creatinine,U: 262.8 mg/dL
Microalb Creat Ratio: 1.6 mg/g (ref 0.0–30.0)
Microalb, Ur: 4.2 mg/dL — ABNORMAL HIGH (ref 0.0–1.9)

## 2021-11-30 MED ORDER — LOSARTAN POTASSIUM 25 MG PO TABS: 25.0000 mg | ORAL_TABLET | Freq: Every day | ORAL | 5 refills | Status: DC

## 2021-11-30 MED ORDER — OZEMPIC (0.25 OR 0.5 MG/DOSE) 2 MG/1.5ML ~~LOC~~ SOPN: PEN_INJECTOR | SUBCUTANEOUS | 2 refills | Status: DC

## 2021-11-30 NOTE — Patient Instructions (Addendum)
Start ozempic and losartan Maintain metformin dose. Schedule appt with nutritionist and ophthalmologist.  Semaglutide Injection What is this medication? SEMAGLUTIDE (SEM a GLOO tide) treats type 2 diabetes. It works by increasing insulin levels in your body, which decreases your blood sugar (glucose). It also reduces the amount of sugar released into the blood and slows down your digestion. It can also be used to lower the risk of heart attack and stroke in people with type 2 diabetes. Changes to diet and exercise are often combined with this medication. This medicine may be used for other purposes; ask your health care provider or pharmacist if you have questions. COMMON BRAND NAME(S): OZEMPIC What should I tell my care team before I take this medication? They need to know if you have any of these conditions: Endocrine tumors (MEN 2) or if someone in your family had these tumors Eye disease, vision problems History of pancreatitis Kidney disease Stomach problems Thyroid cancer or if someone in your family had thyroid cancer An unusual or allergic reaction to semaglutide, other medications, foods, dyes, or preservatives Pregnant or trying to get pregnant Breast-feeding How should I use this medication? This medication is for injection under the skin of your upper leg (thigh), stomach area, or upper arm. It is given once every week (every 7 days). You will be taught how to prepare and give this medication. Use exactly as directed. Take your medication at regular intervals. Do not take it more often than directed. If you use this medication with insulin, you should inject this medication and the insulin separately. Do not mix them together. Do not give the injections right next to each other. Change (rotate) injection sites with each injection. It is important that you put your used needles and syringes in a special sharps container. Do not put them in a trash can. If you do not have a sharps  container, call your pharmacist or care team to get one. A special MedGuide will be given to you by the pharmacist with each prescription and refill. Be sure to read this information carefully each time. This medication comes with INSTRUCTIONS FOR USE. Ask your pharmacist for directions on how to use this medication. Read the information carefully. Talk to your pharmacist or care team if you have questions. Talk to your care team about the use of this medication in children. Special care may be needed. Overdosage: If you think you have taken too much of this medicine contact a poison control center or emergency room at once. NOTE: This medicine is only for you. Do not share this medicine with others. What if I miss a dose? If you miss a dose, take it as soon as you can within 5 days after the missed dose. Then take your next dose at your regular weekly time. If it has been longer than 5 days after the missed dose, do not take the missed dose. Take the next dose at your regular time. Do not take double or extra doses. If you have questions about a missed dose, contact your care team for advice. What may interact with this medication? Other medications for diabetes Many medications may cause changes in blood sugar, these include: Alcohol containing beverages Antiviral medications for HIV or AIDS Aspirin and aspirin-like medications Certain medications for blood pressure, heart disease, irregular heart beat Chromium Diuretics Male hormones, such as estrogens or progestins, birth control pills Fenofibrate Gemfibrozil Isoniazid Lanreotide Male hormones or anabolic steroids MAOIs like Carbex, Eldepryl, Marplan, Nardil, and Parnate  Medications for weight loss Medications for allergies, asthma, cold, or cough Medications for depression, anxiety, or psychotic disturbances Niacin Nicotine NSAIDs, medications for pain and inflammation, like ibuprofen or  naproxen Octreotide Pasireotide Pentamidine Phenytoin Probenecid Quinolone antibiotics such as ciprofloxacin, levofloxacin, ofloxacin Some herbal dietary supplements Steroid medications such as prednisone or cortisone Sulfamethoxazole; trimethoprim Thyroid hormones Some medications can hide the warning symptoms of low blood sugar (hypoglycemia). You may need to monitor your blood sugar more closely if you are taking one of these medications. These include: Beta-blockers, often used for high blood pressure or heart problems (examples include atenolol, metoprolol, propranolol) Clonidine Guanethidine Reserpine This list may not describe all possible interactions. Give your health care provider a list of all the medicines, herbs, non-prescription drugs, or dietary supplements you use. Also tell them if you smoke, drink alcohol, or use illegal drugs. Some items may interact with your medicine. What should I watch for while using this medication? Visit your care team for regular checks on your progress. Drink plenty of fluids while taking this medication. Check with your care team if you get an attack of severe diarrhea, nausea, and vomiting. The loss of too much body fluid can make it dangerous for you to take this medication. A test called the HbA1C (A1C) will be monitored. This is a simple blood test. It measures your blood sugar control over the last 2 to 3 months. You will receive this test every 3 to 6 months. Learn how to check your blood sugar. Learn the symptoms of low and high blood sugar and how to manage them. Always carry a quick-source of sugar with you in case you have symptoms of low blood sugar. Examples include hard sugar candy or glucose tablets. Make sure others know that you can choke if you eat or drink when you develop serious symptoms of low blood sugar, such as seizures or unconsciousness. They must get medical help at once. Tell your care team if you have high blood sugar.  You might need to change the dose of your medication. If you are sick or exercising more than usual, you might need to change the dose of your medication. Do not skip meals. Ask your care team if you should avoid alcohol. Many nonprescription cough and cold products contain sugar or alcohol. These can affect blood sugar. Pens should never be shared. Even if the needle is changed, sharing may result in passing of viruses like hepatitis or HIV. Wear a medical ID bracelet or chain, and carry a card that describes your disease and details of your medication and dosage times. Do not become pregnant while taking this medication. Women should inform their care team if they wish to become pregnant or think they might be pregnant. There is a potential for serious side effects to an unborn child. Talk to your care team for more information. What side effects may I notice from receiving this medication? Side effects that you should report to your care team as soon as possible: Allergic reactions--skin rash, itching, hives, swelling of the face, lips, tongue, or throat Change in vision Dehydration--increased thirst, dry mouth, feeling faint or lightheaded, headache, dark yellow or brown urine Gallbladder problems--severe stomach pain, nausea, vomiting, fever Heart palpitations--rapid, pounding, or irregular heartbeat Kidney injury--decrease in the amount of urine, swelling of the ankles, hands, or feet Pancreatitis--severe stomach pain that spreads to your back or gets worse after eating or when touched, fever, nausea, vomiting Thyroid cancer--new mass or lump in the neck, pain  or trouble swallowing, trouble breathing, hoarseness Side effects that usually do not require medical attention (report to your care team if they continue or are bothersome): Diarrhea Loss of appetite Nausea Stomach pain Vomiting This list may not describe all possible side effects. Call your doctor for medical advice about side  effects. You may report side effects to FDA at 1-800-FDA-1088. Where should I keep my medication? Keep out of the reach of children. Store unopened pens in a refrigerator between 2 and 8 degrees C (36 and 46 degrees F). Do not freeze. Protect from light and heat. After you first use the pen, it can be stored for 56 days at room temperature between 15 and 30 degrees C (59 and 86 degrees F) or in a refrigerator. Throw away your used pen after 56 days or after the expiration date, whichever comes first. Do not store your pen with the needle attached. If the needle is left on, medication may leak from the pen. NOTE: This sheet is a summary. It may not cover all possible information. If you have questions about this medicine, talk to your doctor, pharmacist, or health care provider.  2023 Elsevier/Gold Standard (2020-03-19 00:00:00)

## 2021-11-30 NOTE — Progress Notes (Signed)
Established Patient Visit  Patient: Derrick Scott   DOB: 1976-01-06   46 y.o. Male  MRN: [id number] Visit Date: 11/30/2021  Subjective:    Chief Complaint  Patient presents with  . Acute Visit    C/o left lower leg soreness x 3 weeks  Weight loss options  Metformin discussion  Requesting records for Eye exam   HPI Hypertension No BP check at home. BP not at goal Has not made any lifestyle modifications. BP Readings from Last 3 Encounters:  11/30/21 (!) 140/100  10/28/21 138/76  10/27/21 (!) 154/94    Start losartan '25mg'$  daily Advised about the importance of lifestyle modification and cardio exercise. Check CMP F/up in 19month DM (diabetes mellitus) ([facility] Did not schedule appt with nutritionist. Continues to struggle with skipping meals due to work schedule. He avoids foods with added sugar. Admits to eating large meal in PM. Today he reports LE paresthesia, intermittent, no claudication, no swelling, no erythema. No adverse effects with metformin.  Maintain metformin dose Add ozempic injection Advised to schedule appt with nutritionist.  Class 3 severe obesity due to excess calories with serious comorbidity and body mass index (BMI) of 45.0 to 49.9 in adult ([facility] Advised to schedule appt with nutritionist. Advised to avoid skipping meals. Continue cardio exercise. Start ozempic to help with glucose control and weight loss. Wt Readings from Last 3 Encounters:  11/30/21 (!) 306 lb 9.6 oz (139.1 kg)  10/28/21 (!) 302 lb 6.4 oz (137.2 kg)  10/27/21 (!) 305 lb 8 oz (138.6 kg)      Reviewed medical, surgical, and social history today  Medications: Outpatient Medications Prior to Visit  Medication Sig  . atorvastatin (LIPITOR) 40 MG tablet Take 1 tablet (40 mg total) by mouth daily.  . metFORMIN (GLUCOPHAGE) 500 MG tablet Take 0.5 tablets (250 mg total) by mouth 2 (two) times daily with a meal.  . omeprazole (PRILOSEC) 20 MG capsule Take 1  capsule (20 mg total) by mouth 2 (two) times daily before a meal.   No facility-administered medications prior to visit.   Reviewed past medical and social history.   ROS per HPI above      Objective:  BP (!) 140/100   Pulse (!) 106   Temp (!) 95.8 F (35.4 C) (Temporal)   Ht '5\' 8"'$  (1.727 m)   Wt (!) 306 lb 9.6 oz (139.1 kg)   SpO2 94%   BMI 46.62 kg/m      Physical Exam Vitals reviewed.  Constitutional:      Appearance: He is obese.  Cardiovascular:     Rate and Rhythm: Normal rate and regular rhythm.     Pulses: Normal pulses.     Heart sounds: Normal heart sounds.  Pulmonary:     Effort: Pulmonary effort is normal.     Breath sounds: Normal breath sounds.  Musculoskeletal:        General: No swelling or tenderness. Normal range of motion.     Left hip: Normal.     Left upper leg: Normal.     Left knee: Crepitus present. No effusion or bony tenderness. Normal range of motion. No tenderness.     Right lower leg: No edema.     Left lower leg: Normal. No edema.  Skin:    General: Skin is warm and dry.     Findings: No erythema.  Neurological:     Mental  Status: He is alert and oriented to person, place, and time.    No results found for any visits on 11/30/21.    Assessment & Plan:    Problem List Items Addressed This Visit       Cardiovascular and Mediastinum   Hypertension - Primary    No BP check at home. BP not at goal Has not made any lifestyle modifications. BP Readings from Last 3 Encounters:  11/30/21 (!) 140/100  10/28/21 138/76  10/27/21 (!) 154/94    Start losartan '25mg'$  daily Advised about the importance of lifestyle modification and cardio exercise. Check CMP F/up in 74month     Relevant Medications   losartan (COZAAR) 25 MG tablet   Other Relevant Orders   Comprehensive metabolic panel     Endocrine   DM (diabetes mellitus) ([place]    Did not schedule appt with nutritionist. Continues to struggle with skipping meals due to work  schedule. He avoids foods with added sugar. Admits to eating large meal in PM. Today he reports LE paresthesia, intermittent, no claudication, no swelling, no erythema. No adverse effects with metformin.  Maintain metformin dose Add ozempic injection Advised to schedule appt with nutritionist.      Relevant Medications   Semaglutide,0.25 or 0.'5MG'$ /DOS, (OZEMPIC, 0.25 OR 0.5 MG/DOSE,) 2 MG/1.5ML SOPN   losartan (COZAAR) 25 MG tablet   Other Relevant Orders   Microalbumin / creatinine urine ratio   Comprehensive metabolic panel     Other   Class 3 severe obesity due to excess calories with serious comorbidity and body mass index (BMI) of 45.0 to 49.9 in adult ([facility]    Advised to schedule appt with nutritionist. Advised to avoid skipping meals. Continue cardio exercise. Start ozempic to help with glucose control and weight loss. Wt Readings from Last 3 Encounters:  11/30/21 (!) 306 lb 9.6 oz (139.1 kg)  10/28/21 (!) 302 lb 6.4 oz (137.2 kg)  10/27/21 (!) 305 lb 8 oz (138.6 kg)         Relevant Medications   Semaglutide,0.25 or 0.'5MG'$ /DOS, (OZEMPIC, 0.25 OR 0.5 MG/DOSE,) 2 MG/1.5ML SOPN   Other Visit Diagnoses     Hyperlipidemia associated with type 2 diabetes mellitus ([facility]       Relevant Medications   Semaglutide,0.25 or 0.'5MG'$ /DOS, (OZEMPIC, 0.25 OR 0.5 MG/DOSE,) 2 MG/1.5ML SOPN   losartan (COZAAR) 25 MG tablet   Other Relevant Orders   Lipid panel   Comprehensive metabolic panel      Return in about 4 weeks (around 12/28/2021) for DM, HTN.     CWilfred Lacy NP

## 2021-11-30 NOTE — Assessment & Plan Note (Addendum)
Advised to schedule appt with nutritionist. Advised to avoid skipping meals. Continue cardio exercise. Start ozempic to help with glucose control and weight loss. Wt Readings from Last 3 Encounters:  11/30/21 (!) 306 lb 9.6 oz (139.1 kg)  10/28/21 (!) 302 lb 6.4 oz (137.2 kg)  10/27/21 (!) 305 lb 8 oz (138.6 kg)

## 2021-11-30 NOTE — Assessment & Plan Note (Signed)
No BP check at home. BP not at goal Has not made any lifestyle modifications. BP Readings from Last 3 Encounters:  11/30/21 (!) 140/100  10/28/21 138/76  10/27/21 (!) 154/94    Start losartan '25mg'$  daily Advised about the importance of lifestyle modification and cardio exercise. Check CMP F/up in 107month

## 2021-11-30 NOTE — Assessment & Plan Note (Signed)
Did not schedule appt with nutritionist. Continues to struggle with skipping meals due to work schedule. He avoids foods with added sugar. Admits to eating large meal in PM. Today he reports LE paresthesia, intermittent, no claudication, no swelling, no erythema. No adverse effects with metformin.  Maintain metformin dose Add ozempic injection Advised to schedule appt with nutritionist.

## 2021-12-02 DIAGNOSIS — R0789 Other chest pain: Secondary | ICD-10-CM | POA: Diagnosis not present

## 2021-12-02 DIAGNOSIS — R0602 Shortness of breath: Secondary | ICD-10-CM | POA: Diagnosis not present

## 2021-12-02 DIAGNOSIS — T68XXXA Hypothermia, initial encounter: Secondary | ICD-10-CM | POA: Diagnosis not present

## 2021-12-02 DIAGNOSIS — R079 Chest pain, unspecified: Secondary | ICD-10-CM | POA: Diagnosis not present

## 2021-12-14 ENCOUNTER — Other Ambulatory Visit: Payer: Self-pay

## 2021-12-14 ENCOUNTER — Emergency Department (HOSPITAL_BASED_OUTPATIENT_CLINIC_OR_DEPARTMENT_OTHER)
Admission: EM | Admit: 2021-12-14 | Discharge: 2021-12-14 | Disposition: A | Discharge status: Home / Self Care | Payer: BC Managed Care – PPO | Attending: Emergency Medicine | Admitting: Emergency Medicine

## 2021-12-14 ENCOUNTER — Encounter (HOSPITAL_BASED_OUTPATIENT_CLINIC_OR_DEPARTMENT_OTHER): Payer: Self-pay

## 2021-12-14 DIAGNOSIS — E119 Type 2 diabetes mellitus without complications: Secondary | ICD-10-CM | POA: Insufficient documentation

## 2021-12-14 DIAGNOSIS — J101 Influenza due to other identified influenza virus with other respiratory manifestations: Secondary | ICD-10-CM | POA: Diagnosis not present

## 2021-12-14 DIAGNOSIS — Z7984 Long term (current) use of oral hypoglycemic drugs: Secondary | ICD-10-CM | POA: Diagnosis not present

## 2021-12-14 DIAGNOSIS — Z20822 Contact with and (suspected) exposure to covid-19: Secondary | ICD-10-CM | POA: Diagnosis not present

## 2021-12-14 DIAGNOSIS — Z79899 Other long term (current) drug therapy: Secondary | ICD-10-CM | POA: Diagnosis not present

## 2021-12-14 DIAGNOSIS — J45909 Unspecified asthma, uncomplicated: Secondary | ICD-10-CM | POA: Diagnosis not present

## 2021-12-14 DIAGNOSIS — R059 Cough, unspecified: Secondary | ICD-10-CM | POA: Diagnosis not present

## 2021-12-14 LAB — RESP PANEL BY RT-PCR (FLU A&B, COVID) ARPGX2
Influenza A by PCR: POSITIVE — AB
Influenza B by PCR: NEGATIVE
SARS Coronavirus 2 by RT PCR: NEGATIVE

## 2021-12-14 MED ORDER — OSELTAMIVIR PHOSPHATE 75 MG PO CAPS: 75.0000 mg | ORAL_CAPSULE | Freq: Two times a day (BID) | ORAL | 0 refills | Status: DC

## 2021-12-14 NOTE — ED Triage Notes (Signed)
C/o congestion, sore throat, body aches, eye pain, cough since yesterday. Denies shortness of breath. C/o chest pain from coughing. Negative home covid test.

## 2021-12-14 NOTE — ED Provider Notes (Signed)
[facility] [facility] Provider Note   CSN: [id number] Arrival date & time: 12/14/21  1752     History  Chief Complaint  Patient presents with   URI    Derrick Scott is a 46 y.o. male.   URI   46 year old male presents emergency department with complaints of sore throat, body aches, nasal congestion, bilateral eye pressure/pain, cough.  Patient states that symptoms began abruptly yesterday afternoon.  Denies any known sick contacts.  Has taken no medication for this.  Denies difficulty breathing/swallowing.  Denies any visual disturbance.  Patient does note some chest pain worsened with coughing and not exacerbated by physical activity or taking a deep breath.  Denies any fever, chills, night sweats/stiffness, shortness of breath, abdominal pain, nausea, vomiting, urinary symptoms, change in bowel habits.  Past medical history significant for asthma, diabetes mellitus, GERD, obesity, OSA  Home Medications Prior to Admission medications   Medication Sig Start Date End Date Taking? Authorizing Provider  oseltamivir (TAMIFLU) 75 MG capsule Take 1 capsule (75 mg total) by mouth every 12 (twelve) hours. 12/14/21  Yes Dion Saucier A, PA  atorvastatin (LIPITOR) 40 MG tablet Take 1 tablet (40 mg total) by mouth daily. 12/23/20   Dickie La, MD  losartan (COZAAR) 25 MG tablet Take 1 tablet (25 mg total) by mouth daily. 11/30/21   Nche, Charlene Brooke, NP  metFORMIN (GLUCOPHAGE) 500 MG tablet Take 0.5 tablets (250 mg total) by mouth 2 (two) times daily with a meal. 09/21/21   Nche, Charlene Brooke, NP  omeprazole (PRILOSEC) 20 MG capsule Take 1 capsule (20 mg total) by mouth 2 (two) times daily before a meal. 10/28/21   Nche, Charlene Brooke, NP  Semaglutide,0.25 or 0.'5MG'$ /DOS, (OZEMPIC, 0.25 OR 0.5 MG/DOSE,) 2 MG/1.5ML SOPN 0.25ng weekly x 2weeks then 0.'5mg'$  weekly continuously 11/30/21   Nche, Charlene Brooke, NP      Allergies    Shrimp [shellfish allergy]     Review of Systems   Review of Systems  All other systems reviewed and are negative.   Physical Exam Updated Vital Signs BP (!) 144/92   Pulse 91   Temp 98.9 F (37.2 C)   Resp 18   Ht '5\' 8"'$  (1.727 m)   Wt 132.9 kg   SpO2 98%   BMI 44.55 kg/m  Physical Exam Vitals and nursing note reviewed.  Constitutional:      General: He is not in acute distress.    Appearance: He is well-developed.  HENT:     Head: Normocephalic and atraumatic.     Nose: Congestion present.     Mouth/Throat:     Pharynx: Posterior oropharyngeal erythema present.     Comments: Mild posterior pharyngeal erythema noted.  Uvula midline with symmetrically phonation.  Tonsils 3+ bilaterally which patient states this is baseline.  No obvious exudate. Eyes:     Conjunctiva/sclera: Conjunctivae normal.  Cardiovascular:     Rate and Rhythm: Normal rate and regular rhythm.     Heart sounds: No murmur heard. Pulmonary:     Effort: Pulmonary effort is normal. No respiratory distress.     Breath sounds: Normal breath sounds. No stridor. No wheezing, rhonchi or rales.  Abdominal:     Palpations: Abdomen is soft.     Tenderness: There is no abdominal tenderness.  Musculoskeletal:        General: No swelling.     Cervical back: Neck supple. No rigidity or tenderness.  Skin:    General: Skin  is warm and dry.     Capillary Refill: Capillary refill takes less than 2 seconds.  Neurological:     Mental Status: He is alert.  Psychiatric:        Mood and Affect: Mood normal.     ED Results / Procedures / Treatments   Labs (all labs ordered are listed, but only abnormal results are displayed) Labs Reviewed  RESP PANEL BY RT-PCR (FLU A&B, COVID) ARPGX2 - Abnormal; Notable for the following components:      Result Value   Influenza A by PCR POSITIVE (*)    All other components within normal limits    EKG None  Radiology No results found.  Procedures Procedures    Medications Ordered in  ED Medications - No data to display  ED Course/ Medical Decision Making/ A&P                           Medical Decision Making Risk Prescription drug management.   This patient presents to the ED for concern of flulike symptoms, this involves an extensive number of treatment options, and is a complaint that carries with it a high risk of complications and morbidity.  The differential diagnosis includes influenza, COVID, sepsis, meningitis, URI, pneumonia   Co morbidities that complicate the patient evaluation  See HPI   Additional history obtained:  Additional history obtained from EMR External records from outside source obtained and reviewed including hospital records   Lab Tests:  I Ordered, and personally interpreted labs.  The pertinent results include: Positive influenza A, negative influenza B/COVID of respiratory viral panel.   Imaging Studies ordered:  N/a   Cardiac Monitoring: / EKG:  The patient was maintained on a cardiac monitor.  I personally viewed and interpreted the cardiac monitored which showed an underlying rhythm of: Sinus rhythm   Consultations Obtained:  N/a   Problem List / ED Course / Critical interventions / Medication management  Influenza A I ordered medication  Reevaluation of the patient after these medicines showed that the patient stayed the same I have reviewed the patients home medicines and have made adjustments as needed   Social Determinants of Health:  Denies tobacco or illicit drug use.   Test / Admission - Considered:  Influenza A Vitals signs significant for mild hypertension with blood pressure 140/91.  Patient initially tachycardic with a heart rate of 110 which decreased with time elapsed on emergency department within normal range.  By evaluation, heart rate in the mid 80s see above. Otherwise within normal range and stable throughout visit. Laboratory studies significant for: See above Patient symptoms likely  secondary to influenza A as described above.  Patient recommended symptomatic therapy with Tylenol/Motrin as needed for diffuse myalgias/fever.  Recommended oral antihistamine and nasal steroid spray for nasal congestions, sore throat and eye pain.  Also recommended taking Tamiflu for the next 5 days given recent diagnosis and symptom onset.  Recommend reevaluation by primary care provider in 5 to 7 days.  Treatment plan discussed with patient and he acknowledged understand was agreeable to said plan. Worrisome signs and symptoms were discussed with the patient, and the patient acknowledged understanding to return to the ED if noticed. Patient was stable upon discharge.          Final Clinical Impression(s) / ED Diagnoses Final diagnoses:  Influenza A    Rx / DC Orders ED Discharge Orders  Ordered    oseltamivir (TAMIFLU) 75 MG capsule  Every 12 hours        12/14/21 1952              Wilnette Kales, [place] 12/14/21 2039    Hayden Rasmussen, MD 12/15/21 [phone]

## 2021-12-14 NOTE — Discharge Instructions (Signed)
Note the visit to the emergency department was overall reassuring.  You tested positive for influenza A.  As discussed, we will treat this with an antiviral medication called Tamiflu for the next 5 days.  Recommend taking Motrin/Tylenol as needed for muscle aches/headache/fever.  Recommend oral allergy medicine such as Zyrtec or Allegra or Claritin daily while your symptoms persist as well as nasal steroid such as Nasacort/Flonase of which can find all these medications over-the-counter pharmacy.  Recommend reevaluation by primary care provider in 5 to 7 days.  Please do not hesitate to return to emergency department if the worrisome signs and symptoms we discussed become apparent.

## 2021-12-15 ENCOUNTER — Telehealth: Payer: Self-pay

## 2021-12-15 NOTE — Telephone Encounter (Signed)
Transition Care Management Follow-up Telephone Call Date of discharge and from where: 12/14/2021 [facility] How have you been since you were released from the hospital? Feeling a little worse but ok Any questions or concerns? No  Items Reviewed: Did the pt receive and understand the discharge instructions provided? Yes  Medications obtained and verified? Yes  Other? No  Any new allergies since your discharge? No  Dietary orders reviewed? Yes Do you have support at home? Yes   Home Care and Equipment/Supplies: Were home health services ordered? not applicable If so, what is the name of the agency? N/a  Has the agency set up a time to come to the patient's home? not applicable Were any new equipment or medical supplies ordered?  No What is the name of the medical supply agency? N/a Were you able to get the supplies/equipment? not applicable Do you have any questions related to the use of the equipment or supplies? No  Functional Questionnaire: (I = Independent and D = Dependent) ADLs: I  Bathing/Dressing- I  Meal Prep- I  Eating- I  Maintaining continence- I  Transferring/Ambulation- I  Managing Meds- I  Follow up appointments reviewed:  PCP Hospital f/u appt confirmed? No  Scheduled to see Wilfred Lacy on 12/28/2021 @ 8:00. Are transportation arrangements needed? No  If their condition worsens, is the pt aware to call PCP or go to the Emergency Dept.? Yes Was the patient provided with contact information for the PCP's office or ED? Yes Was to pt encouraged to call back with questions or concerns? Yes

## 2021-12-20 ENCOUNTER — Telehealth: Payer: Self-pay | Admitting: Nurse Practitioner

## 2021-12-20 ENCOUNTER — Other Ambulatory Visit: Payer: Self-pay

## 2021-12-20 MED ORDER — ATORVASTATIN CALCIUM 40 MG PO TABS: 40.0000 mg | ORAL_TABLET | Freq: Every day | ORAL | 3 refills | Status: DC

## 2021-12-20 NOTE — Telephone Encounter (Signed)
Caller Name: Kass Herberger Call back phone #: [phone]  Reason for Call: Pt called to request for approval of Atorvastatin. He only has one pill after pharmacy sent request to wrong PCP. Please send to  [facility], [place] [address] [place] [address] Phone: [phone]  Fax: [phone]

## 2021-12-28 ENCOUNTER — Telehealth: Payer: Self-pay | Admitting: Nurse Practitioner

## 2021-12-28 ENCOUNTER — Ambulatory Visit (INDEPENDENT_AMBULATORY_CARE_PROVIDER_SITE_OTHER): Payer: BC Managed Care – PPO | Admitting: Nurse Practitioner

## 2021-12-28 ENCOUNTER — Encounter: Payer: Self-pay | Admitting: Nurse Practitioner

## 2021-12-28 VITALS — BP 120/82 | HR 104 | Temp 97.5°F | Ht 68.0 in | Wt 293.4 lb

## 2021-12-28 DIAGNOSIS — E1165 Type 2 diabetes mellitus with hyperglycemia: Secondary | ICD-10-CM

## 2021-12-28 DIAGNOSIS — I10 Essential (primary) hypertension: Secondary | ICD-10-CM

## 2021-12-28 DIAGNOSIS — E278 Other specified disorders of adrenal gland: Secondary | ICD-10-CM

## 2021-12-28 DIAGNOSIS — R3915 Urgency of urination: Secondary | ICD-10-CM | POA: Diagnosis not present

## 2021-12-28 DIAGNOSIS — Z6841 Body Mass Index (BMI) 40.0 and over, adult: Secondary | ICD-10-CM

## 2021-12-28 LAB — POCT URINALYSIS DIPSTICK
Bilirubin, UA: NEGATIVE
Blood, UA: 10
Glucose, UA: NEGATIVE
Ketones, UA: NEGATIVE
Leukocytes, UA: NEGATIVE
Nitrite, UA: NEGATIVE
Protein, UA: POSITIVE — AB
Spec Grav, UA: >=1.03 — AB (ref 1.010–1.025)
Urobilinogen, UA: 0.2 E.U./dL
pH, UA: 5 (ref 5.0–8.0)

## 2021-12-28 LAB — POCT GLYCOSYLATED HEMOGLOBIN (HGB A1C): Hemoglobin A1C: 7 % — AB (ref 4.0–5.6)

## 2021-12-28 MED ORDER — OZEMPIC (1 MG/DOSE) 4 MG/3ML ~~LOC~~ SOPN: 1.0000 mg | PEN_INJECTOR | SUBCUTANEOUS | 0 refills | Status: DC

## 2021-12-28 NOTE — Telephone Encounter (Signed)
Pt advised.

## 2021-12-28 NOTE — Telephone Encounter (Signed)
You had a CT renal completed November last year. It showed an adrenal mass. I entered a referral to endocrinology for additional evaluation.

## 2021-12-28 NOTE — Assessment & Plan Note (Addendum)
Fasting glucose: 140-150 Denies any adverse effects with ozempic and metformin Lost 13lbs in last 57month  Repeat hgbA1c: 7% from 7.4% Increase ozempic dose to '1mg'$  weekly Maintain metformin dose F/up in 366month

## 2021-12-28 NOTE — Patient Instructions (Signed)
Go to lab hgbA1c at 7.0 from 7.4% Increase ozempic dose to '1mg'$  weekly F/up in 83month

## 2021-12-28 NOTE — Assessment & Plan Note (Signed)
Ct renal on 01/2020 noted Intermediate density RIGHT adrenal nodule which is stable in size in comparison to most recent prior but has been slowly increasing in size over multiple more remote priors. Favored to represent a benign lipid poor adenoma.   Entered referral to endocrinology

## 2021-12-28 NOTE — Progress Notes (Signed)
Established Patient Visit  Patient: Derrick Scott   DOB: October 19, 1975   46 y.o. Male  MRN: [id number] Visit Date: 12/28/2021  Subjective:    Chief Complaint  Patient presents with   Office Visit    DM/ HTN  Pt fasting  Checks BP daily  Tolerating ozempic well Requesting records for eye exam   HPI Report urinary urgency without retention or frequency, no flank pain, no hematuria, no fever.  DM (diabetes mellitus) ([place]) Fasting glucose: 140-150 Denies any adverse effects with ozempic and metformin Lost 13lbs in last 19month  Repeat hgbA1c: 7% from 7.4% Increase ozempic dose to '1mg'$  weekly Maintain metformin dose F/up in 322month  Hypertension BP at goal with losartan BP Readings from Last 3 Encounters:  12/28/21 120/82  12/14/21 (!) 144/92  11/30/21 (!) 140/100    Maintain med dose  Adrenal mass, right ([facility] on 01/2020 noted Intermediate density RIGHT adrenal nodule which is stable in size in comparison to most recent prior but has been slowly increasing in size over multiple more remote priors. Favored to represent a benign lipid poor adenoma.   Entered referral to endocrinology  Class 3 severe obesity due to excess calories with serious comorbidity and body mass index (BMI) of 45.0 to 49.9 in adult [facility] has continued Diet: high protein, low fat, low sodium Exercise: cardio 5-23m23ms, 3-4x/week Lost 13lbs in last 78mo478monthh help of ozempic Wt Readings from Last 3 Encounters:  12/28/21 293 lb 6.4 oz (133.1 kg)  12/14/21 293 lb (132.9 kg)  11/30/21 (!) 306 lb 9.6 oz (139.1 kg)     Wt Readings from Last 3 Encounters:  12/28/21 293 lb 6.4 oz (133.1 kg)  12/14/21 293 lb (132.9 kg)  11/30/21 (!) 306 lb 9.6 oz (139.1 kg)    BP Readings from Last 3 Encounters:  12/28/21 120/82  12/14/21 (!) 144/92  11/30/21 (!) 140/100     Reviewed medical, surgical, and social history today  Medications: Outpatient Medications Prior to Visit   Medication Sig   atorvastatin (LIPITOR) 40 MG tablet Take 1 tablet (40 mg total) by mouth daily.   losartan (COZAAR) 25 MG tablet Take 1 tablet (25 mg total) by mouth daily.   metFORMIN (GLUCOPHAGE) 500 MG tablet Take 0.5 tablets (250 mg total) by mouth 2 (two) times daily with a meal.   omeprazole (PRILOSEC) 20 MG capsule Take 1 capsule (20 mg total) by mouth 2 (two) times daily before a meal.   [DISCONTINUED] Semaglutide,0.25 or 0.'5MG'$ /DOS, (OZEMPIC, 0.25 OR 0.5 MG/DOSE,) 2 MG/1.5ML SOPN 0.25ng weekly x 2weeks then 0.'5mg'$  weekly continuously   [DISCONTINUED] oseltamivir (TAMIFLU) 75 MG capsule Take 1 capsule (75 mg total) by mouth every 12 (twelve) hours. (Patient not taking: Reported on 12/28/2021)   No facility-administered medications prior to visit.   Reviewed past medical and social history.   ROS per HPI above      Objective:  BP 120/82 (BP Location: Right Arm, Patient Position: Sitting, Cuff Size: Large)   Pulse (!) 104   Temp (!) 97.5 F (36.4 C) (Temporal)   Ht '5\' 8"'$  (1.727 m)   Wt 293 lb 6.4 oz (133.1 kg)   SpO2 95%   BMI 44.61 kg/m      Physical Exam Cardiovascular:     Rate and Rhythm: Normal rate and regular rhythm.     Pulses: Normal pulses.     Heart sounds:  Normal heart sounds.  Pulmonary:     Effort: Pulmonary effort is normal.     Breath sounds: Normal breath sounds.  Musculoskeletal:     Right lower leg: No edema.     Left lower leg: No edema.  Neurological:     Mental Status: He is alert and oriented to person, place, and time.     Results for orders placed or performed in visit on 12/28/21  POCT urinalysis dipstick  Result Value Ref Range   Color, UA Yellow    Clarity, UA Cloudy    Glucose, UA Negative Negative   Bilirubin, UA Negative    Ketones, UA Negative    Spec Grav, UA >=1.030 (A) 1.010 - 1.025   Blood, UA 10    pH, UA 5.0 5.0 - 8.0   Protein, UA Positive (A) Negative   Urobilinogen, UA 0.2 0.2 or 1.0 E.U./dL   Nitrite, UA  Negative    Leukocytes, UA Negative Negative   Appearance Cloudy    Odor None   POCT glycosylated hemoglobin (Hb A1C)  Result Value Ref Range   Hemoglobin A1C 7.0 (A) 4.0 - 5.6 %      Assessment & Plan:    Problem List Items Addressed This Visit       Cardiovascular and Mediastinum   Hypertension    BP at goal with losartan BP Readings from Last 3 Encounters:  12/28/21 120/82  12/14/21 (!) 144/92  11/30/21 (!) 140/100    Maintain med dose        Endocrine   DM (diabetes mellitus) (HCC) - Primary    Fasting glucose: 140-150 Denies any adverse effects with ozempic and metformin Lost 13lbs in last 80month  Repeat hgbA1c: 7% from 7.4% Increase ozempic dose to '1mg'$  weekly Maintain metformin dose F/up in 339month      Relevant Medications   Semaglutide, 1 MG/DOSE, (OZEMPIC, 1 MG/DOSE,) 4 MG/3ML SOPN   Other Relevant Orders   POCT glycosylated hemoglobin (Hb A1C) (Completed)     Other   Adrenal mass, right ([facility]gar   Ct renal on 01/2020 noted Intermediate density RIGHT adrenal nodule which is stable in size in comparison to most recent prior but has been slowly increasing in size over multiple more remote priors. Favored to represent a benign lipid poor adenoma.   Entered referral to endocrinology      Relevant Orders   Ambulatory referral to Endocrinology   Class 3 severe obesity due to excess calories with serious comorbidity and body mass index (BMI) of 45.0 to 49.9 in adult ([facility]   He has continued Diet: high protein, low fat, low sodium Exercise: cardio 5-64m40ms, 3-4x/week Lost 13lbs in last 38mo68monthh help of ozempic Wt Readings from Last 3 Encounters:  12/28/21 293 lb 6.4 oz (133.1 kg)  12/14/21 293 lb (132.9 kg)  11/30/21 (!) 306 lb 9.6 oz (139.1 kg)         Relevant Medications   Semaglutide, 1 MG/DOSE, (OZEMPIC, 1 MG/DOSE,) 4 MG/3ML SOPN   Other Visit Diagnoses     Urinary urgency       Relevant Orders   POCT urinalysis dipstick (Completed)       Return in about 3 months (around 03/29/2022) for HTN, DM, hyperlipidemia (fasting).     CharWilfred Lacy

## 2021-12-28 NOTE — Assessment & Plan Note (Signed)
He has continued Diet: high protein, low fat, low sodium Exercise: cardio 5-63mles, 3-4x/week Lost 13lbs in last 180monthith help of ozempic Wt Readings from Last 3 Encounters:  12/28/21 293 lb 6.4 oz (133.1 kg)  12/14/21 293 lb (132.9 kg)  11/30/21 (!) 306 lb 9.6 oz (139.1 kg)

## 2021-12-28 NOTE — Assessment & Plan Note (Signed)
>>  ASSESSMENT AND PLAN FOR ADRENAL MASS, RIGHT (HCC) WRITTEN ON 12/28/2021  8:59 AM BY NCHE, CHARLOTTE LUM, NP  Ct renal on 01/2020 noted Intermediate density RIGHT adrenal nodule which is stable in size in comparison to most recent prior but has been slowly increasing in size over multiple more remote priors. Favored to represent a benign lipid poor adenoma.   Entered referral to endocrinology

## 2021-12-28 NOTE — Assessment & Plan Note (Signed)
BP at goal with losartan BP Readings from Last 3 Encounters:  12/28/21 120/82  12/14/21 (!) 144/92  11/30/21 (!) 140/100    Maintain med dose

## 2021-12-30 ENCOUNTER — Telehealth: Payer: Self-pay | Admitting: Nurse Practitioner

## 2021-12-30 NOTE — Telephone Encounter (Signed)
Pt was seen by [name] on 12/28/21 and she listened to his chest and said it sounded clear. He has had a persistent cough that now has become a productive with discolored mucous. He would like advice on what he should do. Pt at [phone]

## 2021-12-31 NOTE — Telephone Encounter (Signed)
Pt advised.

## 2022-01-11 ENCOUNTER — Encounter: Payer: Self-pay | Admitting: Nurse Practitioner

## 2022-01-21 DIAGNOSIS — G4733 Obstructive sleep apnea (adult) (pediatric): Secondary | ICD-10-CM | POA: Diagnosis not present

## 2022-01-25 DIAGNOSIS — M5431 Sciatica, right side: Secondary | ICD-10-CM | POA: Diagnosis not present

## 2022-01-28 ENCOUNTER — Encounter: Payer: Self-pay | Admitting: Physical Medicine and Rehabilitation

## 2022-02-07 ENCOUNTER — Encounter: Payer: Self-pay | Admitting: Physical Medicine and Rehabilitation

## 2022-02-07 ENCOUNTER — Encounter
Payer: BC Managed Care – PPO | Attending: Physical Medicine and Rehabilitation | Admitting: Physical Medicine and Rehabilitation

## 2022-02-07 VITALS — BP 102/72 | HR 102 | Ht 68.0 in | Wt 287.0 lb

## 2022-02-07 DIAGNOSIS — M6289 Other specified disorders of muscle: Secondary | ICD-10-CM | POA: Diagnosis not present

## 2022-02-07 DIAGNOSIS — M25551 Pain in right hip: Secondary | ICD-10-CM | POA: Insufficient documentation

## 2022-02-07 DIAGNOSIS — S76811A Strain of other specified muscles, fascia and tendons at thigh level, right thigh, initial encounter: Secondary | ICD-10-CM | POA: Insufficient documentation

## 2022-02-07 DIAGNOSIS — G8929 Other chronic pain: Secondary | ICD-10-CM | POA: Diagnosis not present

## 2022-02-07 DIAGNOSIS — M545 Low back pain, unspecified: Secondary | ICD-10-CM | POA: Insufficient documentation

## 2022-02-07 NOTE — Progress Notes (Signed)
Subjective:    Patient ID: Derrick Scott, male    DOB: Feb 16, 1975, 47 y.o.   MRN: [id number]  HPI Derrick Scott is a 47 y.o. year old male  who  has a past medical history of Arthritis, Asthma, Diabetes mellitus without complication ([place]), GERD (gastroesophageal reflux disease), High cholesterol, Medial meniscus tear (01/2012), Obesity, Pneumonia, Renal disorder, Sleep apnea, and Vertigo, benign paroxysmal (2015).   They are presenting to PM&R clinic as a new patient for pain management evaluation. They were referred by Dr. Sandi Mariscal for treatment of Back pain.   Lumbar spine xr 12/16/20 without any abnormal findings. States Dr. [name] in 2022 did an ultrasound and thought he might need a hip replacement. Per her note 04/29/20: "Sx c/w hip labral pathology. He has no insurance and does not want to pursue expensive imaging /possoble surgical intervention at this time We discussed I do not think it is risky to follow it for a while If sx worsen, or frequency increases, may need mR Arthrogram left hip joint"  Source: R low back  Inciting incident: Has been occurring "on and off" for a few years. Most recently started in October during a trip to [facility] with lots of walking; went away before the end of the month, but on New Year's eve was sitting at a sports bar and it came back, has not left since.  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 Description of pain: Sharp, burning pain in his buttocks extending into his lateral thigh and groin. + numbness/tingling rarely associated.  Severity: On average 7/10. At worst 10 /10. At best 4/10. 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 Sitting on a bike at the gym. First thing when he wakes up. Laying on his right side.  Remitting factors: Laying flat or on his left side.  Red flag symptoms: Patient denies saddle anesthesia, loss of bowel or bladder continence, new  weakness, new numbness/tingling, or pain waking up at nighttime.  Has tried not wearing belt, which does not help.   Medications tried: Topical medications ( mild effect) : Has used [employer] balm Nsaids ( ? effect): Cannot take due to HTN  Tylenol  ( mile effect): Takes with benefit Opiates  ( x effect): none Gabapentin / Lyrica  ( x effect): none TCAs  ( x effect): none SNRIs  ( x effect): none Other  ( x effect): none  Other treatments: PT/OT  (mild effect): Never formally. Derrick Scott been doing back and pelvic stretches, with mild releif but does not stay. Turning his torso with his knees bent seems to help the most.  Accupuncture/chiropractor/massage  ( x effect): none TENs unit ( x effect): none Injections ( no effect): Has gotten once before, around 2022, without relief. Was done blind.  Surgery ( x effect) : none Other  ( x effect): Last Ha1c 7.0.  Goals for pain control: Improved ability to sit in his car as part of his job, or walk for long periods. Improve ability to tolerate gym exercise.    Pain Inventory Average Pain 10 Pain Right Now 5 My pain is intermittent, sharp, burning, and stabbing  In the last 24 hours, has pain interfered with the following? General activity 7 Relation with others 8 Enjoyment of life 10 What TIME of day is your pain at its worst? morning  Sleep (in general) Fair  Pain is worse with: walking, bending, sitting, standing, and some activites Pain  improves with: rest Relief from Meds: 0  how many minutes can you walk? 10 ability to climb steps?  yes Do you drive: yes  employed # of hrs/week 50 hours per week [employer] Do you have any goals in this area?  yes  weakness spasms  Any changes since last visit?  no  Any changes since last visit?  no    Family History  Problem Relation Age of Onset   Asthma Mother    Diabetes Father    Cancer Father        prostate   Osteoarthritis Sister    Diabetes Sister    Colon cancer  Neg Hx    Colon polyps Neg Hx    Esophageal cancer Neg Hx    Rectal cancer Neg Hx    Stomach cancer Neg Hx    Social History   Socioeconomic History   Marital status: Married    Spouse name: Not on file   Number of children: Not on file   Years of education: Not on file   Highest education level: Not on file  Occupational History   Not on file  Tobacco Use   Smoking status: Never   Smokeless tobacco: Never  Vaping Use   Vaping Use: Never used  Substance and Sexual Activity   Alcohol use: No   Drug use: No   Sexual activity: Not on file  Other Topics Concern   Not on file  Social History Narrative   Right handed   Drinks caffeine   One story home   Social Determinants of Health   Financial Resource Strain: Not on file  Food Insecurity: Not on file  Transportation Needs: Not on file  Physical Activity: Not on file  Stress: Not on file  Social Connections: Not on file   Past Surgical History:  Procedure Laterality Date   CORNEAL TRANSPLANT  12/2009   KNEE ARTHROSCOPY  02/20/2012   Procedure: ARTHROSCOPY KNEE;  Surgeon: Alta Corning, MD;  Location: [facility];  Service: Orthopedics;  Laterality: Right;  Chondroplasia patella femoral joint, medial meniscal debriedment   Past Medical History:  Diagnosis Date   Arthritis    knee bilateral   Asthma    as a child "NO attacks"   Diabetes mellitus without complication ([place])    border line no medicationa   GERD (gastroesophageal reflux disease)    High cholesterol    Medial meniscus tear 01/2012   right knee   Obesity    Pneumonia    Renal disorder    hematuria,kidney stones   Sleep apnea    Vertigo, benign paroxysmal 2015   Ht '5\' 8"'$  (1.727 m)   BMI 44.61 kg/m   Opioid Risk Score:   Fall Risk Score:  `1  Depression screen PHQ 2/9     10/28/2021   11:01 AM 09/21/2021    1:50 PM 07/02/2021    9:36 AM 03/17/2021   10:37 AM 03/04/2021   10:08 AM 02/17/2021    9:11 AM 01/27/2021    9:33 AM   Depression screen PHQ 2/9  Decreased Interest 0 0 0 0 0 0 0  Down, Depressed, Hopeless 0 0 0 0 0 0 0  PHQ - 2 Score 0 0 0 0 0 0 0  Altered sleeping  3 0 0 0 0 0  Tired, decreased energy  0 1 0 0 1 0  Change in appetite  0 0 0 1 0 0  Feeling bad or  failure about yourself   0 0 0 0 0 0  Trouble concentrating  0 0 0 0 0 0  Moving slowly or fidgety/restless  0 0 0 0 0 0  Suicidal thoughts  0 0 0 0 0 0  PHQ-9 Score  3 1 0 1 1 0  Difficult doing work/chores  Not difficult at all  Not difficult at all Not difficult at all      Review of Systems  Musculoskeletal:  Positive for back pain.       RIGHT HIP PAIN SPASMS  Neurological:  Positive for weakness.  All other systems reviewed and are negative.      Objective:   Physical Exam  Constitution: Appropriate appearance for age. No apparent distress  +Obese HEENT: PERRL, EOMI grossly intact.  Resp: CTAB. No rales, rhonchi, or wheezing. Cardio: RRR. No mumurs, rubs, or gallops.  No peripheral edema. Abdomen: Nondistended. Nontender. +bowel sounds. Psych: Appropriate mood and affect. Neuro: AAOx4. No apparent deficits. Sensation to light touch intact in L1-S1 dermatomes bilaterally. Reflexes 2+ patella, achilles bilaterally.   MSK: 5/5 strength bilateral upper and lower extremities   Back Exam:   Inspection: Pelvis was even.  Lumbar lordotic curvature was mildly increased .  There was no evidence of scoliosis.  Palpation: Palpatory exam revealed ttp at the R flank, hip. No TTP lumbar paraspinals, spinous processes, PSIS, SI joints, greater trochanters, or IT bands. There was no evidence of spasm. No trigger points were noted.    ROM:  Flexion and extension WNL  Special/provocative testing:    SLR: -   Slump test: -     Facet loading: -   TTP at paraspinals: -  + FADIR hip and lateral groin pain;  + mild discomfort with FABER same area    Thomas Test: + R > L      Assessment & Plan:   Derrick Scott is a 47 y.o.  year old male  who  has a past medical history of Arthritis, Asthma, Diabetes mellitus without complication ([place]), GERD (gastroesophageal reflux disease), High cholesterol, Medial meniscus tear (01/2012), Obesity, Pneumonia, Renal disorder, Sleep apnea, and Vertigo, benign paroxysmal (2015).   They are presenting to PM&R clinic as a new patient for pain management evaluation. They were referred by Dr. Sandi Mariscal for treatment of Back pain.   Chronic midline low back pain without sciatica Assessment & Plan: Pain currently more R flank and hip, most consistent with iliopsoas strain/tendinopathy due to prolonged sitting and hip flexor tightness  Orders: -     Ambulatory referral to Physical Therapy  Right hip pain Assessment & Plan: I am getting an xray to evaluate your right hip for any worsening arthritis; I will call you with results once available and let you know if I think repeat injections with ultrasound guidance would be helpful.  Had minimal benefit form blind hip injections in the past.   Per Dr. Verlon Au note 2022, ? Labral pathology, however no current catching/locking issues per patient. Can look into further if pain does not improve with current Tx.   Orders: -     Ambulatory referral to Physical Therapy -     DG HIP UNILAT W OR W/O PELVIS 2-3 VIEWS RIGHT; Future  Strain of iliopsoas muscle, right, initial encounter Assessment & Plan: I am sending you to PT for tendinopathy of your hip flexor muscles to work on stretching, strengthening supporting muscles, and developing a home exercise program.   I recommend alternating  Tylenol 500 mg tablets 2 tablets three times daily and Ibuprofen 200 mg tablets 2 tablets three times daily for 2 weeks. After 2 weeks, stop and return to only taking tylenol as needed.  Use warm heat and massage on your low back as needed.   Follow up with me in 1-2 months.   Orders: -     Ambulatory referral to Physical Therapy  Hamstring tightness of right  lower extremity     Gertie Gowda, DO 02/08/2022

## 2022-02-07 NOTE — Patient Instructions (Addendum)
I am sending you to PT for tendinopathy of your hip flexor muscles to work on stretching, strengthening supporting muscles, and developing a home exercise program.   I am getting an xray to evaluate your right hip for any worsening arthritis; I will call you with results once available and let you know if I think repeat injections with ultrasound guidance would be helpful.  I recommend alternating Tylenol 500 mg tablets 2 tablets three times daily and Ibuprofen 200 mg tablets 2 tablets three times daily for 2 weeks. After 2 weeks, stop and return to only taking tylenol as needed.  Use warm heat and massage on your low back as needed.   Follow up with me in 1-2 months.

## 2022-02-08 DIAGNOSIS — M6289 Other specified disorders of muscle: Secondary | ICD-10-CM | POA: Insufficient documentation

## 2022-02-08 DIAGNOSIS — S76811A Strain of other specified muscles, fascia and tendons at thigh level, right thigh, initial encounter: Secondary | ICD-10-CM | POA: Insufficient documentation

## 2022-02-08 NOTE — Assessment & Plan Note (Signed)
I am getting an xray to evaluate your right hip for any worsening arthritis; I will call you with results once available and let you know if I think repeat injections with ultrasound guidance would be helpful.  Had minimal benefit form blind hip injections in the past.   Per Dr. Verlon Au note 2022, ? Labral pathology, however no current catching/locking issues per patient. Can look into further if pain does not improve with current Tx.

## 2022-02-08 NOTE — Assessment & Plan Note (Signed)
I am sending you to PT for tendinopathy of your hip flexor muscles to work on stretching, strengthening supporting muscles, and developing a home exercise program.   I recommend alternating Tylenol 500 mg tablets 2 tablets three times daily and Ibuprofen 200 mg tablets 2 tablets three times daily for 2 weeks. After 2 weeks, stop and return to only taking tylenol as needed.  Use warm heat and massage on your low back as needed.   Follow up with me in 1-2 months.

## 2022-02-08 NOTE — Assessment & Plan Note (Signed)
Pain currently more R flank and hip, most consistent with iliopsoas strain/tendinopathy due to prolonged sitting and hip flexor tightness

## 2022-02-11 ENCOUNTER — Ambulatory Visit: Payer: BC Managed Care – PPO | Attending: Physical Medicine and Rehabilitation | Admitting: Physical Therapy

## 2022-02-11 DIAGNOSIS — R293 Abnormal posture: Secondary | ICD-10-CM | POA: Diagnosis not present

## 2022-02-11 DIAGNOSIS — G8929 Other chronic pain: Secondary | ICD-10-CM | POA: Diagnosis not present

## 2022-02-11 DIAGNOSIS — S76811A Strain of other specified muscles, fascia and tendons at thigh level, right thigh, initial encounter: Secondary | ICD-10-CM | POA: Diagnosis not present

## 2022-02-11 DIAGNOSIS — M545 Low back pain, unspecified: Secondary | ICD-10-CM | POA: Insufficient documentation

## 2022-02-11 DIAGNOSIS — M25551 Pain in right hip: Secondary | ICD-10-CM | POA: Insufficient documentation

## 2022-02-11 NOTE — Therapy (Signed)
OUTPATIENT PHYSICAL THERAPY LOWER EXTREMITY EVALUATION   Patient Name: Derrick Scott MRN: [id number] DOB:05/19/1975, 47 y.o., male Today's Date: 02/11/2022  END OF SESSION:  PT End of Session - 02/11/22 0802     Visit Number 1    Number of Visits 5   Plus eval   Date for PT Re-Evaluation 03/18/22    Authorization Type BCBS    PT Start Time 0800    PT Stop Time 0830   evaluation   PT Time Calculation (min) 30 min    Activity Tolerance Patient tolerated treatment well    Behavior During Therapy WFL for tasks assessed/performed             Past Medical History:  Diagnosis Date   Arthritis    knee bilateral   Asthma    as a child "NO attacks"   Diabetes mellitus without complication ([place])    border line no medicationa   GERD (gastroesophageal reflux disease)    High cholesterol    Medial meniscus tear 01/2012   right knee   Obesity    Pneumonia    Renal disorder    hematuria,kidney stones   Sleep apnea    Vertigo, benign paroxysmal 2015   Past Surgical History:  Procedure Laterality Date   CORNEAL TRANSPLANT  12/2009   KNEE ARTHROSCOPY  02/20/2012   Procedure: ARTHROSCOPY KNEE;  Surgeon: Alta Corning, MD;  Location: [facility];  Service: Orthopedics;  Laterality: Right;  Chondroplasia patella femoral joint, medial meniscal debriedment   Patient Active Problem List   Diagnosis Date Noted   Strain of iliopsoas muscle, right, initial encounter 02/08/2022   Hamstring tightness of right lower extremity 02/08/2022   Hypertension 10/28/2021   OSA on CPAP 10/27/2021   Class 3 severe obesity due to excess calories with serious comorbidity and body mass index (BMI) of 45.0 to 49.9 in adult [facility]) 10/27/2021   Family history of breast cancer in first degree relative 09/21/2021   Family history of prostate cancer in father 09/21/2021   Rhinitis 03/04/2021   Chronic left shoulder pain 12/16/2020   Screening for malignant neoplasm of prostate  12/16/2020   Chronic midline low back pain without sciatica 12/16/2020   Right hip pain 04/29/2020   DM (diabetes mellitus) ([place]) 12/25/2019   Internal hemorrhoids without complication 19/41/7408   Other tear of medial meniscus, current injury, left knee, subsequent encounter 05/04/2017   Pes planus 02/07/2017   Laryngopharyngeal reflux (LPR) 07/24/2015   Post corneal transplant 07/25/2014   Vertigo 08/30/2013   Adrenal mass, right ([place]) 05/22/2013   Sleep apnea 07/31/2009   OBESITY, NOS 03/16/2006    PCP: Flossie Buffy, NP  REFERRING PROVIDER: Gertie Gowda, DO  REFERRING DIAG: M54.50,G89.29 (ICD-10-CM) - Chronic midline low back pain without sciatica M25.551 (ICD-10-CM) - Right hip pain S76.811A (ICD-10-CM) - Strain of iliopsoas muscle, right, initial encounter  THERAPY DIAG:  Pain in right hip  Abnormal posture  Rationale for Evaluation and Treatment: Rehabilitation  ONSET DATE: 02/07/2022  SUBJECTIVE:   SUBJECTIVE STATEMENT: Pt reports his R hip pain has been on and off for a while. On December 30th, was at a sports bar in [place] and sat on some hard stools and his hip has been hurting ever since. Has more pain in the morning than at night and cannot lay on R side. Pain starts in low back and radiates anterolaterally into anterior hip. No other injuries.   PERTINENT HISTORY: Arthritis, Asthma, Diabetes mellitus  without complication ([place]), GERD (gastroesophageal reflux disease), High cholesterol, Medial meniscus tear (01/2012), Obesity, Pneumonia, Renal disorder, Sleep apnea, and Vertigo, benign paroxysmal (2015). PAIN:  Are you having pain? Yes: NPRS scale: 3-4/10 Pain location: R hip Pain description: Throbbing, ache Aggravating factors: sitting for prolonged periods, stairs  Relieving factors: lying on back, LTRs  PRECAUTIONS: None  WEIGHT BEARING RESTRICTIONS: No  FALLS:  Has patient fallen in last 6 months? No  LIVING ENVIRONMENT: Lives with:  lives with their spouse Lives in: House/apartment Stairs: No Has following equipment at home: None  OCCUPATION: Training and development officer for [employer]   PLOF: Independent  PATIENT GOALS: "To get to a point where I can either strengthen or loosen up the muscles"   NEXT MD VISIT:   OBJECTIVE:   DIAGNOSTIC FINDINGS: X-ray of R hip pending  PATIENT SURVEYS:  LEFS 48/80 = 60% (mild to moderate functional limitation)   COGNITION: Overall cognitive status: Within functional limits for tasks assessed     SENSATION: Pt denies numbness/tingling in LEs   POSTURE: rounded shoulders, forward head, increased thoracic kyphosis, posterior pelvic tilt, and flexed trunk    LOWER EXTREMITY SPECIAL TESTS: Assessed on R side  Hip special tests: Trendelenburg test: negative and Thomas test: positive  Noted increased hip flexion/abduction in [place] test and significant pain in R hip w/position   GAIT: Distance walked: various clinic distances  Assistive device utilized: None Level of assistance: Complete Independence Comments: Noted wide BOS and decreased trunk rotation, greatly impacted by body habitus    TODAY'S TREATMENT:      Next Session                                                                                                                             PATIENT EDUCATION:  Education details: Initial HEP, eval findings  Person educated: Patient Education method: Explanation, Demonstration, and Handouts Education comprehension: verbalized understanding  HOME EXERCISE PROGRAM: Access Code: [email or web address] URL: [email or web address] Date: 02/11/2022 Prepared by: Mickie Bail Bernadette Armijo  Exercises - Modified Thomas Stretch  - 1 x daily - 7 x weekly - 3-4 reps - 30-60 second hold - Posterior pelvic tilt  - 1 x daily - 7 x weekly - 3 sets - 10 reps - Lower Trunk Rotation Stretch  - 1 x daily - 7 x weekly - 3 sets - 10 reps - Supine Straight Leg Lumbar Rotation Stretch  - 1 x daily  - 7 x weekly - 3 sets - 10 reps - Supine Piriformis Stretch with Foot on Ground  - 1 x daily - 7 x weekly - 3-4 reps - 30-45 second hold - Supine Bridge  - 1 x daily - 7 x weekly - 3 sets - 10 reps  ASSESSMENT:  CLINICAL IMPRESSION: Patient is a 47 year old male referred to Neuro OPPT for R hip pain. Pt's PMH is significant for: Arthritis, Asthma, Diabetes mellitus without complication ([place]), GERD (gastroesophageal reflux disease), High cholesterol, Medial meniscus  tear (01/2012), Obesity, Pneumonia, Renal disorder, Sleep apnea, and Vertigo, benign paroxysmal (2015). The following deficits were present during the exam: decreased R hip ROM and decreased activity tolerance. Pt would benefit from skilled PT to address these impairments and functional limitations to maximize functional mobility independence.   OBJECTIVE IMPAIRMENTS: decreased activity tolerance, difficulty walking, improper body mechanics, obesity, and pain  ACTIVITY LIMITATIONS: lifting, bending, squatting, stairs, and locomotion level  PARTICIPATION LIMITATIONS: driving, community activity, and occupation  PERSONAL FACTORS: Fitness and 1 comorbidity: sedentary occupation  are also affecting patient's functional outcome.   REHAB POTENTIAL: Excellent  CLINICAL DECISION MAKING: Stable/uncomplicated  EVALUATION COMPLEXITY: Low   GOALS: Goals reviewed with patient? Yes  STG = LTG DUE TO POC LENGTH  LONG TERM GOALS: Target date: 03/18/2022   Pt will be independent with final HEP for improved strength, balance, transfers and gait. Baseline: established on eval  Goal status: INITIAL  2.  Pt will improve LEFS by 8 points for improved self-perceived functional ability  Baseline: 48/80  Goal status: INITIAL  3.  Pt will report <2/10 pain in R hip consistently for improved QOL and activity tolerance  Baseline: >4/10 consistently  Goal status: INITIAL  4.  5x STS to be addressed and goal written  Baseline:  Goal status:  INITIAL    PLAN:  PT FREQUENCY: 1x/week  PT DURATION: 4 weeks  PLANNED INTERVENTIONS: Therapeutic exercises, Therapeutic activity, Neuromuscular re-education, Balance training, Gait training, Patient/Family education, Self Care, Joint mobilization, Joint manipulation, Stair training, Vestibular training, Canalith repositioning, Dry Needling, Manual therapy, and Re-evaluation  PLAN FOR NEXT SESSION: 5x STS. How is HEP? Add to it for R hip stretching, single leg stability and low back pain relief. Pt is going to gym so can review exercises for him to do there    Maisee Vollman E Leatha Rohner, PT, DPT 02/11/2022, 8:36 AM

## 2022-02-21 DIAGNOSIS — G4733 Obstructive sleep apnea (adult) (pediatric): Secondary | ICD-10-CM | POA: Diagnosis not present

## 2022-02-23 ENCOUNTER — Ambulatory Visit: Payer: BC Managed Care – PPO | Admitting: Physical Therapy

## 2022-02-25 ENCOUNTER — Other Ambulatory Visit: Payer: Self-pay | Admitting: Nurse Practitioner

## 2022-02-25 DIAGNOSIS — K219 Gastro-esophageal reflux disease without esophagitis: Secondary | ICD-10-CM

## 2022-02-25 MED ORDER — OMEPRAZOLE 20 MG PO CPDR: 20.0000 mg | DELAYED_RELEASE_CAPSULE | Freq: Every day | ORAL | 0 refills | Status: DC

## 2022-02-25 NOTE — Progress Notes (Signed)
Omeprazole refilled. Dose decreased to 56m daily

## 2022-03-02 ENCOUNTER — Ambulatory Visit: Payer: BC Managed Care – PPO | Admitting: Physical Therapy

## 2022-03-02 ENCOUNTER — Encounter: Payer: Self-pay | Admitting: Physical Therapy

## 2022-03-02 NOTE — Therapy (Signed)
[facility] [address] [place], [place], [address] Phone: [phone]   Fax:  [phone]  Patient Details  Name: Derrick Scott MRN: [id number] Date of Birth: 05/24/1975 Referring Provider:  No ref. provider found  Encounter Date: 03/02/2022  PHYSICAL THERAPY DISCHARGE SUMMARY  Visits from Start of Care: 1  Current functional level related to goals / functional outcomes: Unable to assess as pt did not return after eval    Remaining deficits: N/A   Education / Equipment: HEP   Patient agrees to discharge. Patient goals were  discontinued . Patient is being discharged due to  not returning since evaluation and requesting to cancel remainder of appointments.   Cruzita Lederer Fabio Wah, PT, DPT 03/02/2022, 8:14 AM  [facility] [address] [place], [place], [address] Phone: [phone]   Fax:  [phone]

## 2022-03-03 DIAGNOSIS — M25569 Pain in unspecified knee: Secondary | ICD-10-CM | POA: Diagnosis not present

## 2022-03-07 ENCOUNTER — Encounter
Payer: BC Managed Care – PPO | Attending: Physical Medicine and Rehabilitation | Admitting: Physical Medicine and Rehabilitation

## 2022-03-07 DIAGNOSIS — G8929 Other chronic pain: Secondary | ICD-10-CM | POA: Insufficient documentation

## 2022-03-07 DIAGNOSIS — M6289 Other specified disorders of muscle: Secondary | ICD-10-CM | POA: Insufficient documentation

## 2022-03-07 DIAGNOSIS — M545 Low back pain, unspecified: Secondary | ICD-10-CM | POA: Insufficient documentation

## 2022-03-07 DIAGNOSIS — S76811A Strain of other specified muscles, fascia and tendons at thigh level, right thigh, initial encounter: Secondary | ICD-10-CM | POA: Insufficient documentation

## 2022-03-07 DIAGNOSIS — M25551 Pain in right hip: Secondary | ICD-10-CM | POA: Insufficient documentation

## 2022-03-07 NOTE — Progress Notes (Deleted)
Subjective:    Patient ID: Charlestine Night II, male    DOB: 01/25/1975, 47 y.o.   MRN: [id number]  HPI   Pain Inventory Average Pain {NUMBERS; 0-10:5044} Pain Right Now {NUMBERS; 0-10:5044} My pain is {PAIN DESCRIPTION:21022940}  In the last 24 hours, has pain interfered with the following? General activity {NUMBERS; 0-10:5044} Relation with others {NUMBERS; 0-10:5044} Enjoyment of life {NUMBERS; 0-10:5044} What TIME of day is your pain at its worst? {time of day:24191} Sleep (in general) {BHH GOOD/FAIR/POOR:22877}  Pain is worse with: {ACTIVITIES:21022942} Pain improves with: {PAIN IMPROVES BW:[id number] Relief from Meds: {NUMBERS; 0-10:5044}  Family History  Problem Relation Age of Onset   Asthma Mother    Diabetes Father    Cancer Father        prostate   Osteoarthritis Sister    Diabetes Sister    Colon cancer Neg Hx    Colon polyps Neg Hx    Esophageal cancer Neg Hx    Rectal cancer Neg Hx    Stomach cancer Neg Hx    Social History   Socioeconomic History   Marital status: Married    Spouse name: Not on file   Number of children: Not on file   Years of education: Not on file   Highest education level: Not on file  Occupational History   Not on file  Tobacco Use   Smoking status: Never   Smokeless tobacco: Never  Vaping Use   Vaping Use: Never used  Substance and Sexual Activity   Alcohol use: No   Drug use: No   Sexual activity: Not on file  Other Topics Concern   Not on file  Social History Narrative   Right handed   Drinks caffeine   One story home   Social Determinants of Health   Financial Resource Strain: Not on file  Food Insecurity: Not on file  Transportation Needs: Not on file  Physical Activity: Not on file  Stress: Not on file  Social Connections: Not on file   Past Surgical History:  Procedure Laterality Date   CORNEAL TRANSPLANT  12/2009   KNEE ARTHROSCOPY  02/20/2012   Procedure: ARTHROSCOPY KNEE;  Surgeon: Alta Corning, MD;  Location: [facility];  Service: Orthopedics;  Laterality: Right;  Chondroplasia patella femoral joint, medial meniscal debriedment   Past Surgical History:  Procedure Laterality Date   CORNEAL TRANSPLANT  12/2009   KNEE ARTHROSCOPY  02/20/2012   Procedure: ARTHROSCOPY KNEE;  Surgeon: Alta Corning, MD;  Location: [facility];  Service: Orthopedics;  Laterality: Right;  Chondroplasia patella femoral joint, medial meniscal debriedment   Past Medical History:  Diagnosis Date   Arthritis    knee bilateral   Asthma    as a child "NO attacks"   Diabetes mellitus without complication ([place])    border line no medicationa   GERD (gastroesophageal reflux disease)    High cholesterol    Medial meniscus tear 01/2012   right knee   Obesity    Pneumonia    Renal disorder    hematuria,kidney stones   Sleep apnea    Vertigo, benign paroxysmal 2015   There were no vitals taken for this visit.  Opioid Risk Score:   Fall Risk Score:  `1  Depression screen PHQ 2/9     02/07/2022    2:04 PM 10/28/2021   11:01 AM 09/21/2021    1:50 PM 07/02/2021    9:36 AM 03/17/2021   10:37 AM 03/04/2021  10:08 AM 02/17/2021    9:11 AM  Depression screen PHQ 2/9  Decreased Interest 0 0 0 0 0 0 0  Down, Depressed, Hopeless 0 0 0 0 0 0 0  PHQ - 2 Score 0 0 0 0 0 0 0  Altered sleeping 0  3 0 0 0 0  Tired, decreased energy 0  0 1 0 0 1  Change in appetite 0  0 0 0 1 0  Feeling bad or failure about yourself  0  0 0 0 0 0  Trouble concentrating 0  0 0 0 0 0  Moving slowly or fidgety/restless 0  0 0 0 0 0  Suicidal thoughts 0  0 0 0 0 0  PHQ-9 Score 0  3 1 0 1 1  Difficult doing work/chores   Not difficult at all  Not difficult at all Not difficult at all     Review of Systems     Objective:   Physical Exam        Assessment & Plan:

## 2022-03-09 ENCOUNTER — Ambulatory Visit: Payer: BC Managed Care – PPO | Admitting: Physical Therapy

## 2022-03-14 DIAGNOSIS — H18621 Keratoconus, unstable, right eye: Secondary | ICD-10-CM | POA: Diagnosis not present

## 2022-03-16 ENCOUNTER — Ambulatory Visit: Payer: BC Managed Care – PPO | Admitting: Physical Therapy

## 2022-03-18 ENCOUNTER — Telehealth: Payer: Self-pay | Admitting: Nurse Practitioner

## 2022-03-18 NOTE — Telephone Encounter (Signed)
Derrick Scott  [phone]  He is in need of a refill on his Metformin   [facility]

## 2022-03-21 ENCOUNTER — Other Ambulatory Visit: Payer: Self-pay

## 2022-03-21 DIAGNOSIS — E1165 Type 2 diabetes mellitus with hyperglycemia: Secondary | ICD-10-CM

## 2022-03-21 MED ORDER — METFORMIN HCL 500 MG PO TABS: 250.0000 mg | ORAL_TABLET | Freq: Two times a day (BID) | ORAL | 0 refills | Status: DC

## 2022-03-21 NOTE — Telephone Encounter (Signed)
Medication sent- Advised to keep appointment on 03/30/22

## 2022-03-22 DIAGNOSIS — G4733 Obstructive sleep apnea (adult) (pediatric): Secondary | ICD-10-CM | POA: Diagnosis not present

## 2022-03-24 ENCOUNTER — Emergency Department (HOSPITAL_BASED_OUTPATIENT_CLINIC_OR_DEPARTMENT_OTHER): Payer: BC Managed Care – PPO

## 2022-03-24 ENCOUNTER — Emergency Department (HOSPITAL_BASED_OUTPATIENT_CLINIC_OR_DEPARTMENT_OTHER)
Admission: EM | Admit: 2022-03-24 | Discharge: 2022-03-24 | Disposition: A | Discharge status: Home / Self Care | Payer: BC Managed Care – PPO | Attending: Emergency Medicine | Admitting: Emergency Medicine

## 2022-03-24 ENCOUNTER — Other Ambulatory Visit: Payer: Self-pay

## 2022-03-24 DIAGNOSIS — R0981 Nasal congestion: Secondary | ICD-10-CM

## 2022-03-24 DIAGNOSIS — J40 Bronchitis, not specified as acute or chronic: Secondary | ICD-10-CM | POA: Diagnosis not present

## 2022-03-24 DIAGNOSIS — Z1152 Encounter for screening for COVID-19: Secondary | ICD-10-CM | POA: Insufficient documentation

## 2022-03-24 DIAGNOSIS — R059 Cough, unspecified: Secondary | ICD-10-CM | POA: Diagnosis not present

## 2022-03-24 LAB — RESP PANEL BY RT-PCR (RSV, FLU A&B, COVID)  RVPGX2
Influenza A by PCR: NEGATIVE
Influenza B by PCR: NEGATIVE
Resp Syncytial Virus by PCR: NEGATIVE
SARS Coronavirus 2 by RT PCR: NEGATIVE

## 2022-03-24 MED ORDER — PREDNISONE 10 MG PO TABS: 50.0000 mg | ORAL_TABLET | Freq: Every day | ORAL | 0 refills | Status: DC

## 2022-03-24 MED ORDER — CETIRIZINE HCL 10 MG PO TABS: 10.0000 mg | ORAL_TABLET | Freq: Every day | ORAL | 0 refills | Status: AC

## 2022-03-24 NOTE — Discharge Instructions (Addendum)
We saw in the emergency room for persistent congestion, cough.  The workup in the emergency room is reassuring.  We suspect that you likely are having postviral syndrome and possibly bronchitis.  Take the medications that are prescribed for symptom management.  Continue with your follow-up next week with your PCP.  As discussed, return to the ER if you start having chest pain, shortness of breath, near fainting or fainting spell.  Also return to the ER if you start noticing increasing blood in your sputum.  When you follow-up with your primary care doctor, if you are still having persistent blood in the phlegm please discussed with them if you need additional workup for it.

## 2022-03-24 NOTE — ED Provider Notes (Signed)
[facility] Provider Note   CSN: [id number] Arrival date & time: 03/24/22  [id number]     History  Chief Complaint  Patient presents with   Nasal Congestion   Facial Pain    Derrick Scott is a 47 y.o. male.  HPI    SUBJECTIVE:  Derrick Scott is a 47 y.o. male who complains of congestion, post nasal drip, productive cough, and cough described as painful and productive of yellow and blood streaked sputum for 30 days, with the blood-tinged phlegm coming in later into the disease process.  Patient had COVID-19 in fall and had flu in January.  He states that after the January flu event, he never fully recovered.  He denies a history of chest pain, chills, fevers, and shortness of breath and has a history of asthma.      Home Medications Prior to Admission medications   Medication Sig Start Date End Date Taking? Authorizing Provider  cetirizine (ZYRTEC) 10 MG tablet Take 1 tablet (10 mg total) by mouth daily. 03/24/22  Yes Varney Biles, MD  predniSONE (DELTASONE) 10 MG tablet Take 5 tablets (50 mg total) by mouth daily. 03/24/22  Yes Varney Biles, MD  atorvastatin (LIPITOR) 40 MG tablet Take 1 tablet (40 mg total) by mouth daily. 12/20/21   Nche, Charlene Brooke, NP  losartan (COZAAR) 25 MG tablet Take 1 tablet (25 mg total) by mouth daily. 11/30/21   Nche, Charlene Brooke, NP  metFORMIN (GLUCOPHAGE) 500 MG tablet Take 0.5 tablets (250 mg total) by mouth 2 (two) times daily with a meal. 03/21/22   Nche, Charlene Brooke, NP  omeprazole (PRILOSEC) 20 MG capsule Take 1 capsule (20 mg total) by mouth daily. 02/25/22   Nche, Charlene Brooke, NP  Semaglutide, 1 MG/DOSE, (OZEMPIC, 1 MG/DOSE,) 4 MG/3ML SOPN Inject 1 mg into the skin once a week. 12/28/21   Nche, Charlene Brooke, NP      Allergies    Shrimp [shellfish allergy]    Review of Systems   Review of Systems  All other systems reviewed and are negative.   Physical Exam Updated Vital  Signs BP (!) 132/90 (BP Location: Left Arm)   Pulse (!) 102   Temp 98.9 F (37.2 C) (Oral)   Resp 18   Ht '5\' 8"'$  (1.727 m)   Wt 128.4 kg   SpO2 98%   BMI 43.03 kg/m  Physical Exam Vitals and nursing note reviewed.  Constitutional:      Appearance: He is well-developed.  HENT:     Head: Atraumatic.  Cardiovascular:     Rate and Rhythm: Tachycardia present.  Pulmonary:     Effort: Pulmonary effort is normal.  Musculoskeletal:     Cervical back: Neck supple.  Skin:    General: Skin is warm.  Neurological:     Mental Status: He is alert and oriented to person, place, and time.     ED Results / Procedures / Treatments   Labs (all labs ordered are listed, but only abnormal results are displayed) Labs Reviewed  RESP PANEL BY RT-PCR (RSV, FLU A&B, COVID)  [id number]    EKG None  Radiology DG Chest 2 View  Result Date: 03/24/2022 CLINICAL DATA:  Cough/congestion x1 month. EXAM: CHEST - 2 VIEW COMPARISON:  09/29/2020. FINDINGS: Clear lungs. Normal heart size and mediastinal contours. No pleural effusion or pneumothorax. Visualized bones and upper abdomen are unremarkable. IMPRESSION: No evidence of acute cardiopulmonary disease. Electronically Signed   By:  Emmit Alexanders M.D.   On: 03/24/2022 09:34    Procedures Procedures    Medications Ordered in ED Medications - No data to display  ED Course/ Medical Decision Making/ A&P                             Medical Decision Making Amount and/or Complexity of Data Reviewed Radiology: ordered.  Risk OTC drugs. Prescription drug management.   47 year old male comes in with 1 month of persistent cough, congestion and productive phlegm.  Now the phlegm also has blood-tinged to it.  Patient had flu in January, and his respiratory symptoms have just not improved since then.  He denies any chest pain, shortness of breath and there is no history of PE, DVT and no signs of DVT on exam.  Patient also had COVID-19 in fall.  Over  here patient is tachycardic, but there is no other abnormality.  His lungs are clear.  X-ray independently interpreted, there is no evidence of any pleural effusion, pneumonia.  Differential diagnosis considered for this patient includes pneumonia, bronchitis, postviral syndrome,bronchospasm and bronchiolitis.  Appropriate labs have been ordered.  COVID-19, flu test, RSV test is negative.   OBJECTIVE exam: He appears well, vital signs are as noted. Ears normal.  Throat and pharynx normal.  Neck supple. No adenopathy in the neck. Nose is congested. Sinuses non tender. The chest is clear, without wheezes or rales.  ASSESSMENT:  viral upper respiratory illness and bronchitis  We will treat this with prednisone and Zyrtec to see if he can dry the postnasal drip.  He will take inhaler every 4-6 hours.  Patient will follow-up with PCP in 1 week, they can perform advanced workup if his symptoms or not improving.  Patient will return to the ER if his symptoms are getting worse. Final Clinical Impression(s) / ED Diagnoses Final diagnoses:  Nasal congestion  Bronchitis    Rx / DC Orders ED Discharge Orders          Ordered    predniSONE (DELTASONE) 10 MG tablet  Daily        03/24/22 1222    cetirizine (ZYRTEC) 10 MG tablet  Daily        03/24/22 1222              Varney Biles, MD 03/24/22 1231

## 2022-03-24 NOTE — ED Triage Notes (Signed)
C/O sinus pain and chest congestion x 1 month, c/o post nasal drip and sore throat as well.

## 2022-03-29 ENCOUNTER — Other Ambulatory Visit: Payer: Self-pay | Admitting: Nurse Practitioner

## 2022-03-29 ENCOUNTER — Ambulatory Visit: Payer: BC Managed Care – PPO | Admitting: Nurse Practitioner

## 2022-03-29 DIAGNOSIS — E1165 Type 2 diabetes mellitus with hyperglycemia: Secondary | ICD-10-CM

## 2022-03-30 ENCOUNTER — Other Ambulatory Visit: Payer: Self-pay

## 2022-03-30 ENCOUNTER — Telehealth: Payer: Self-pay | Admitting: Nurse Practitioner

## 2022-03-30 ENCOUNTER — Encounter: Payer: Self-pay | Admitting: Nurse Practitioner

## 2022-03-30 ENCOUNTER — Ambulatory Visit (INDEPENDENT_AMBULATORY_CARE_PROVIDER_SITE_OTHER): Payer: BC Managed Care – PPO | Admitting: Nurse Practitioner

## 2022-03-30 VITALS — BP 118/80 | HR 114 | Temp 98.8°F | Resp 16 | Ht 68.0 in | Wt 288.4 lb

## 2022-03-30 DIAGNOSIS — E278 Other specified disorders of adrenal gland: Secondary | ICD-10-CM

## 2022-03-30 DIAGNOSIS — I10 Essential (primary) hypertension: Secondary | ICD-10-CM | POA: Diagnosis not present

## 2022-03-30 DIAGNOSIS — E1165 Type 2 diabetes mellitus with hyperglycemia: Secondary | ICD-10-CM

## 2022-03-30 DIAGNOSIS — E1169 Type 2 diabetes mellitus with other specified complication: Secondary | ICD-10-CM | POA: Diagnosis not present

## 2022-03-30 DIAGNOSIS — Z6841 Body Mass Index (BMI) 40.0 and over, adult: Secondary | ICD-10-CM

## 2022-03-30 DIAGNOSIS — E785 Hyperlipidemia, unspecified: Secondary | ICD-10-CM

## 2022-03-30 MED ORDER — TIRZEPATIDE 2.5 MG/0.5ML ~~LOC~~ SOAJ: 2.5000 mg | SUBCUTANEOUS | 1 refills | Status: DC

## 2022-03-30 MED ORDER — TIRZEPATIDE 5 MG/0.5ML ~~LOC~~ SOAJ: 5.0000 mg | SUBCUTANEOUS | 1 refills | Status: DC

## 2022-03-30 MED ORDER — METFORMIN HCL 500 MG PO TABS: 250.0000 mg | ORAL_TABLET | Freq: Two times a day (BID) | ORAL | 3 refills | Status: DC

## 2022-03-30 NOTE — Telephone Encounter (Signed)
[facility] [address] [facility] dosage He is asking if you can call and change the dosage.

## 2022-03-30 NOTE — Assessment & Plan Note (Signed)
Upcoming appt with endocrinology 09/13/2022

## 2022-03-30 NOTE — Assessment & Plan Note (Signed)
BP at goal with losartan BP Readings from Last 3 Encounters:  03/30/22 118/80  03/24/22 138/80  02/07/22 102/72    Maintain med dose Repeat BMP

## 2022-03-30 NOTE — Patient Instructions (Addendum)
Schedule appt with GI: [id number].  Let me know if it is more cost effective to switch to mounjaro.  Maintain current medications.  Schedule fasting lab appt.

## 2022-03-30 NOTE — Assessment & Plan Note (Signed)
>>  ASSESSMENT AND PLAN FOR ADRENAL MASS, RIGHT ([facility]) WRITTEN ON 03/30/2022 12:06 PM BY NCHE, CHARLOTTE LUM, NP  Upcoming appt with endocrinology 09/13/2022

## 2022-03-30 NOTE — Assessment & Plan Note (Signed)
Repeat lipid panel Continue atorvastatin dose

## 2022-03-30 NOTE — Telephone Encounter (Signed)
Derrick Scott will send medication

## 2022-03-30 NOTE — Telephone Encounter (Signed)
[facility]

## 2022-03-30 NOTE — Progress Notes (Signed)
Established Patient Visit  Patient: Derrick Scott   DOB: [birth year]   47 y.o. Male  MRN: [id number] Visit Date: 03/30/2022  Subjective:    Chief Complaint  Patient presents with   Medical Management of Chronic Issues    Fasting- No    Medication Refill    Would like to switch to [facility]    HPI Adrenal mass, right ([place]) Upcoming appt with endocrinology 09/13/2022  Hypertension BP at goal with losartan BP Readings from Last 3 Encounters:  03/30/22 118/80  03/24/22 138/80  02/07/22 102/72    Maintain med dose Repeat BMP  DM (diabetes mellitus) ([place]) No glucose check at home No adverse effects with ozempic and metformin Last HgbA1c at 7% He requested to switch ozempic to mounjaro due to high cost of ozempic per patient.  Repeat hgbA1c Switch ozempic '1mg'$  to mounjaro '5mg'$  F/up in 21month  Obesity, morbid (HCC) 6lbs weight loss with diet modification and exercise (weight training and cardio 2-3x/week) Total weight loss in last 320month 19lbs Wt Readings from Last 3 Encounters:  03/30/22 288 lb 6.4 oz (130.8 kg)  03/24/22 283 lb (128.4 kg)  02/07/22 287 lb (130.2 kg)     Hyperlipidemia associated with type 2 diabetes mellitus ([place] lipid panel Continue atorvastatin dose    Reviewed medical, surgical, and social history today  Medications: Outpatient Medications Prior to Visit  Medication Sig Note   meloxicam (MOBIC) 15 MG tablet Take 15 mg by mouth daily.    atorvastatin (LIPITOR) 40 MG tablet Take 1 tablet (40 mg total) by mouth daily.    cetirizine (ZYRTEC) 10 MG tablet Take 1 tablet (10 mg total) by mouth daily.    losartan (COZAAR) 25 MG tablet Take 1 tablet (25 mg total) by mouth daily.    omeprazole (PRILOSEC) 20 MG capsule Take 1 capsule (20 mg total) by mouth daily.    [DISCONTINUED] metFORMIN (GLUCOPHAGE) 500 MG tablet Take 0.5 tablets (250 mg total) by mouth 2 (two) times daily with a meal.    [DISCONTINUED] predniSONE  (DELTASONE) 10 MG tablet Take 5 tablets (50 mg total) by mouth daily.    [DISCONTINUED] Semaglutide, 1 MG/DOSE, (OZEMPIC, 1 MG/DOSE,) 4 MG/3ML SOPN Inject 1 mg into the skin once a week. 03/30/2022: patient request   No facility-administered medications prior to visit.   Reviewed past medical and social history.   ROS per HPI above      Objective:  BP 118/80 (BP Location: Left Arm, Patient Position: Sitting, Cuff Size: Large)   Pulse (!) 114   Temp 98.8 F (37.1 C) (Oral)   Resp 16   Ht '5\' 8"'$  (1.727 m)   Wt 288 lb 6.4 oz (130.8 kg)   SpO2 98%   BMI 43.85 kg/m      Physical Exam Cardiovascular:     Rate and Rhythm: Normal rate and regular rhythm.     Pulses: Normal pulses.     Heart sounds: Normal heart sounds.  Pulmonary:     Effort: Pulmonary effort is normal.     Breath sounds: Normal breath sounds.  Neurological:     Mental Status: He is alert and oriented to person, place, and time.     No results found for any visits on 03/30/22.    Assessment & Plan:    Problem List Items Addressed This Visit       Cardiovascular and Mediastinum   Hypertension -  Primary    BP at goal with losartan BP Readings from Last 3 Encounters:  03/30/22 118/80  03/24/22 138/80  02/07/22 102/72    Maintain med dose Repeat BMP      Relevant Orders   Basic metabolic panel     Endocrine   DM (diabetes mellitus) ([place])    No glucose check at home No adverse effects with ozempic and metformin Last HgbA1c at 7% He requested to switch ozempic to mounjaro due to high cost of ozempic per patient.  Repeat hgbA1c Switch ozempic '1mg'$  to mounjaro '5mg'$  F/up in 26month      Relevant Medications   metFORMIN (GLUCOPHAGE) 500 MG tablet   tirzepatide (MOUNJARO) 5 MG/0.5ML Pen   Other Relevant Orders   Hemoglobin A1c   Hyperlipidemia associated with type 2 diabetes mellitus (HCC)    Repeat lipid panel Continue atorvastatin dose      Relevant Medications   metFORMIN (GLUCOPHAGE) 500  MG tablet   tirzepatide ([facility] 5 MG/0.5ML Pen   Other Relevant Orders   Lipid panel     Other   Adrenal mass, right ([place]    Upcoming appt with endocrinology 09/13/2022      Obesity, morbid ([place]    6lbs weight loss with diet modification and exercise (weight training and cardio 2-3x/week) Total weight loss in last 335month 19lbs Wt Readings from Last 3 Encounters:  03/30/22 288 lb 6.4 oz (130.8 kg)  03/24/22 283 lb (128.4 kg)  02/07/22 287 lb (130.2 kg)         Relevant Medications   metFORMIN (GLUCOPHAGE) 500 MG tablet   tirzepatide (MOUNJARO) 5 MG/0.5ML Pen   Return in about 3 months (around 06/30/2022) for HTN, DM, hyperlipidemia (fasting).     ChWilfred LacyNP

## 2022-03-30 NOTE — Assessment & Plan Note (Signed)
No glucose check at home No adverse effects with ozempic and metformin Last HgbA1c at 7% He requested to switch ozempic to mounjaro due to high cost of ozempic per patient.  Repeat hgbA1c Switch ozempic '1mg'$  to mounjaro '5mg'$  F/up in 57month

## 2022-03-30 NOTE — Assessment & Plan Note (Signed)
6lbs weight loss with diet modification and exercise (weight training and cardio 2-3x/week) Total weight loss in last 41month: 19lbs Wt Readings from Last 3 Encounters:  03/30/22 288 lb 6.4 oz (130.8 kg)  03/24/22 283 lb (128.4 kg)  02/07/22 287 lb (130.2 kg)

## 2022-03-30 NOTE — Telephone Encounter (Signed)
Caller Name: Derrick Scott Call back phone #: [phone]   MEDICATION(S):  [name]  Pt states he talked with [name] about this today. Pt is diabetic so his insurance, [employer] will cover [name]. Pt asking if RX can be sent today. I did advise this medication often requires prior auth.  Just for this medication [id number] [place] [phone] Fax: [phone]     refills.

## 2022-03-31 ENCOUNTER — Other Ambulatory Visit (INDEPENDENT_AMBULATORY_CARE_PROVIDER_SITE_OTHER): Payer: BC Managed Care – PPO

## 2022-03-31 DIAGNOSIS — E785 Hyperlipidemia, unspecified: Secondary | ICD-10-CM

## 2022-03-31 DIAGNOSIS — E1169 Type 2 diabetes mellitus with other specified complication: Secondary | ICD-10-CM

## 2022-03-31 DIAGNOSIS — E1165 Type 2 diabetes mellitus with hyperglycemia: Secondary | ICD-10-CM | POA: Diagnosis not present

## 2022-03-31 DIAGNOSIS — I10 Essential (primary) hypertension: Secondary | ICD-10-CM | POA: Diagnosis not present

## 2022-03-31 LAB — LIPID PANEL
Cholesterol: 122 mg/dL (ref 0–200)
HDL: 34.5 mg/dL — ABNORMAL LOW (ref >39.00)
LDL Cholesterol: 59 mg/dL (ref 0–99)
NonHDL: 87.08
Total CHOL/HDL Ratio: 4
Triglycerides: 142 mg/dL (ref 0.0–149.0)
VLDL: 28.4 mg/dL (ref 0.0–40.0)

## 2022-03-31 LAB — BASIC METABOLIC PANEL
BUN: 13 mg/dL (ref 6–23)
CO2: 28 mEq/L (ref 19–32)
Calcium: 9 mg/dL (ref 8.4–10.5)
Chloride: 100 mEq/L (ref 96–112)
Creatinine, Ser: 1.25 mg/dL (ref 0.40–1.50)
GFR: 68.37 mL/min (ref >60.00)
Glucose, Bld: 119 mg/dL — ABNORMAL HIGH (ref 70–99)
Potassium: 3.7 mEq/L (ref 3.5–5.1)
Sodium: 137 mEq/L (ref 135–145)

## 2022-03-31 LAB — HEMOGLOBIN A1C: Hgb A1c MFr Bld: 6.7 % — ABNORMAL HIGH (ref 4.6–6.5)

## 2022-03-31 NOTE — Progress Notes (Signed)
Pt is here for labs   

## 2022-03-31 NOTE — Progress Notes (Signed)
Abnormal: Stable renal function Improved hgbA1c: 7% to 6.7%. continue current medications Improved triglyceride but HDL is still low. Need to increase cardiac exercise to 176mns weekly. Schedule 334monthf/up appt with me

## 2022-04-06 DIAGNOSIS — T1502XA Foreign body in cornea, left eye, initial encounter: Secondary | ICD-10-CM | POA: Diagnosis not present

## 2022-04-24 ENCOUNTER — Encounter (HOSPITAL_BASED_OUTPATIENT_CLINIC_OR_DEPARTMENT_OTHER): Payer: Self-pay | Admitting: Emergency Medicine

## 2022-04-24 ENCOUNTER — Emergency Department (HOSPITAL_BASED_OUTPATIENT_CLINIC_OR_DEPARTMENT_OTHER)
Admission: EM | Admit: 2022-04-24 | Discharge: 2022-04-24 | Disposition: A | Discharge status: Home / Self Care | Payer: BC Managed Care – PPO | Attending: Emergency Medicine | Admitting: Emergency Medicine

## 2022-04-24 ENCOUNTER — Other Ambulatory Visit: Payer: Self-pay

## 2022-04-24 DIAGNOSIS — E119 Type 2 diabetes mellitus without complications: Secondary | ICD-10-CM | POA: Insufficient documentation

## 2022-04-24 DIAGNOSIS — S39012A Strain of muscle, fascia and tendon of lower back, initial encounter: Secondary | ICD-10-CM | POA: Insufficient documentation

## 2022-04-24 DIAGNOSIS — M5441 Lumbago with sciatica, right side: Secondary | ICD-10-CM | POA: Diagnosis not present

## 2022-04-24 DIAGNOSIS — X500XXA Overexertion from strenuous movement or load, initial encounter: Secondary | ICD-10-CM | POA: Insufficient documentation

## 2022-04-24 DIAGNOSIS — M545 Low back pain, unspecified: Secondary | ICD-10-CM | POA: Diagnosis not present

## 2022-04-24 DIAGNOSIS — I10 Essential (primary) hypertension: Secondary | ICD-10-CM | POA: Insufficient documentation

## 2022-04-24 DIAGNOSIS — M62830 Muscle spasm of back: Secondary | ICD-10-CM | POA: Diagnosis not present

## 2022-04-24 MED ORDER — LIDOCAINE 5 % EX PTCH: 1.0000 | MEDICATED_PATCH | CUTANEOUS | 0 refills | Status: DC

## 2022-04-24 MED ORDER — CYCLOBENZAPRINE HCL 10 MG PO TABS: 10.0000 mg | ORAL_TABLET | Freq: Two times a day (BID) | ORAL | 0 refills | Status: DC | PRN

## 2022-04-24 MED ORDER — METHYLPREDNISOLONE 4 MG PO TBPK: ORAL_TABLET | ORAL | 0 refills | Status: DC

## 2022-04-24 NOTE — ED Provider Notes (Signed)
[facility] Provider Note   CSN: [id number] Arrival date & time: 04/24/22  1749     History  Chief Complaint  Patient presents with   Back Pain    Lenor Derrickathaniel Zaman II is a 47 y.o. male.  The history is provided by the patient and medical records. No language interpreter was used.  Back Pain Location:  Sacro-iliac joint and lumbar spine Quality:  Aching Radiates to:  R posterior upper leg Pain severity:  Moderate Onset quality:  Gradual Duration:  1 day Timing:  Constant Progression:  Waxing and waning Chronicity:  Recurrent Context: lifting heavy objects   Relieved by:  Nothing Worsened by:  Nothing Ineffective treatments:  None tried Associated symptoms: tingling (in R upper leg that has resolved)   Associated symptoms: no abdominal pain, no abdominal swelling, no bladder incontinence, no bowel incontinence, no chest pain, no dysuria, no fever, no headaches, no leg pain, no numbness and no weakness        Home Medications Prior to Admission medications   Medication Sig Start Date End Date Taking? Authorizing Provider  tirzepatide [facility](MOUNJARO) 2.5 MG/0.5ML Pen Inject 2.5 mg into the skin once a week. 03/30/22   Nche, Bonna Gainsharlotte Lum, NP  atorvastatin (LIPITOR) 40 MG tablet Take 1 tablet (40 mg total) by mouth daily. 12/20/21   Nche, Bonna Gainsharlotte Lum, NP  cetirizine (ZYRTEC) 10 MG tablet Take 1 tablet (10 mg total) by mouth daily. 03/24/22   Derwood KaplanNanavati, Ankit, MD  losartan (COZAAR) 25 MG tablet Take 1 tablet (25 mg total) by mouth daily. 11/30/21   Nche, Bonna Gainsharlotte Lum, NP  meloxicam (MOBIC) 15 MG tablet Take 15 mg by mouth daily. 03/03/22   [provider]  metFORMIN (GLUCOPHAGE) 500 MG tablet Take 0.5 tablets (250 mg total) by mouth 2 (two) times daily with a meal. 03/30/22   Nche, Bonna Gainsharlotte Lum, NP  omeprazole (PRILOSEC) 20 MG capsule Take 1 capsule (20 mg total) by mouth daily. 02/25/22   Nche, Bonna Gainsharlotte Lum, NP       Allergies    Shrimp [shellfish allergy]    Review of Systems   Review of Systems  Constitutional:  Negative for chills, fatigue and fever.  HENT:  Negative for congestion.   Eyes:  Negative for visual disturbance.  Respiratory:  Negative for cough, chest tightness, shortness of breath and wheezing.   Cardiovascular:  Negative for chest pain.  Gastrointestinal:  Negative for abdominal pain, bowel incontinence, constipation, diarrhea, nausea and vomiting.  Genitourinary:  Negative for bladder incontinence, dysuria and flank pain.  Musculoskeletal:  Positive for back pain. Negative for neck pain and neck stiffness.  Skin:  Negative for rash and wound.  Neurological:  Positive for tingling (in R upper leg that has resolved). Negative for dizziness, weakness, light-headedness, numbness and headaches.  Psychiatric/Behavioral:  Negative for agitation.   All other systems reviewed and are negative.   Physical Exam Updated Vital Signs BP 123/81   Pulse 98   Temp 98.6 F (37 C) (Oral)   Resp 20   Ht 5\' 8"  (1.727 m)   Wt 126.1 kg   SpO2 95%   BMI 42.27 kg/m  Physical Exam Vitals and nursing note reviewed.  Constitutional:      General: He is not in acute distress.    Appearance: He is well-developed.  HENT:     Head: Normocephalic and atraumatic.     Nose: Nose normal.  Eyes:     Conjunctiva/sclera: Conjunctivae normal.  Cardiovascular:     Rate and Rhythm: Normal rate and regular rhythm.     Heart sounds: No murmur heard. Pulmonary:     Effort: Pulmonary effort is normal. No respiratory distress.     Breath sounds: Normal breath sounds. No wheezing, rhonchi or rales.  Chest:     Chest wall: No tenderness.  Abdominal:     General: Abdomen is flat.     Palpations: Abdomen is soft.     Tenderness: There is no abdominal tenderness. There is no right CVA tenderness, left CVA tenderness, guarding or rebound.  Musculoskeletal:        General: Tenderness present. No swelling.      Cervical back: Neck supple. No tenderness.     Thoracic back: No tenderness or bony tenderness.     Lumbar back: Spasms and tenderness present. No swelling or bony tenderness. Negative right straight leg raise test and negative left straight leg raise test.       Back:     Right lower leg: No edema.     Left lower leg: No edema.     Comments: Spasm and tenderness in right low back and SI and lumbar area.  No midline tenderness.  No rashes due to shingles.  No CVA tenderness.  No abnormality with sensation, strength, and pulses in lower extremities.  Straight leg raise negative on right leg.  Skin:    General: Skin is warm and dry.     Capillary Refill: Capillary refill takes less than 2 seconds.     Findings: No erythema or rash.  Neurological:     General: No focal deficit present.     Mental Status: He is alert.     Sensory: No sensory deficit.     Motor: No weakness.  Psychiatric:        Mood and Affect: Mood normal.     ED Results / Procedures / Treatments   Labs (all labs ordered are listed, but only abnormal results are displayed) Labs Reviewed - No data to display  EKG None  Radiology No results found.  Procedures Procedures    Medications Ordered in ED Medications - No data to display  ED Course/ Medical Decision Making/ A&P                             Medical Decision Making   Neerav Hilborn II is a 47 y.o. male with a past medical history significant for sleep apnea, diabetes, obesity, and hypertension who presents with low back pain.  According to patient, he was trying to slide a heavy, 120 pound dumbbell, across the floor from the right to the left when he had onset of discomfort in his right low back.  He reports he has has pain in this location in the past that has gone down his right leg.  He has never had surgery or other focal trauma.  He reports has been sore ever since today and her 20 tries to walk.  He denies loss of bowel or bladder  control and denies any focal weakness.  He reports he had some tingling and numbness in his lateral right upper leg that has since resolved.  He denies any other injuries or other complaints.  On exam, lungs clear and chest nontender.  Abdomen nontender.  Upper back nontender.  Midline was not tender the low back but he did have a spasm and tenderness in his right low back  and right SI area.  Intact sensation, strength, and pulses in the legs.  Straight leg raise was negative.  Patient otherwise well-appearing.  We had a shared decision-making conversation about management.  We discussed if he was having persistent numbness or weakness or midline tenderness we would likely need to get some degree of imaging.  We discussed that if he is having persistent numbness or weakness would likely MRI as there is no focal trauma, x-ray or CT would likely be less helpful.  Patient agreed that as his symptoms were likely muscle spasm and strain from trying to slide that heavy object, he did not want imaging today.  We will give him prescription for Lidoderm patches, muscle relaxant, as well as a Medrol Dosepak given the symptoms going down his right leg slightly.  We discussed that with his diabetes needs to be careful with steroids but he reports he has tolerated other steroids in the past without difficulty.  He will closely watch his glucoses and will follow-up with his primary doctor.  Will also follow-up with a back doctor.  Patient agreed to plan of care and understood return precautions for any new or worsening symptoms.  You no questions or concerns and was discharged in good condition.         Final Clinical Impression(s) / ED Diagnoses Final diagnoses:  Strain of lumbar region, initial encounter  Acute right-sided low back pain with right-sided sciatica  Muscle spasm of back    Rx / DC Orders ED Discharge Orders          Ordered    lidocaine (LIDODERM) 5 %  Every 24 hours        04/24/22  2016    cyclobenzaprine (FLEXERIL) 10 MG tablet  2 times daily PRN        04/24/22 2016    methylPREDNISolone (MEDROL DOSEPAK) 4 MG TBPK tablet        04/24/22 2016           Clinical Impression: 1. Strain of lumbar region, initial encounter   2. Acute right-sided low back pain with right-sided sciatica   3. Muscle spasm of back     Disposition: Discharge  Condition: Good  I have discussed the results, Dx and Tx plan with the pt(& family if present). He/she/they expressed understanding and agree(s) with the plan. Discharge instructions discussed at great length. Strict return precautions discussed and pt &/or family have verbalized understanding of the instructions. No further questions at time of discharge.    New Prescriptions   CYCLOBENZAPRINE (FLEXERIL) 10 MG TABLET    Take 1 tablet (10 mg total) by mouth 2 (two) times daily as needed for muscle spasms.   LIDOCAINE (LIDODERM) 5 %    Place 1 patch onto the skin daily. Remove & Discard patch within 12 hours or as directed by MD   METHYLPREDNISOLONE (MEDROL DOSEPAK) 4 MG TBPK TABLET    Please use medication as directed on Dosepak    Follow Up: Anne Ng, NP [address] [place] [address] [phone]     [place], [facility] [address] [place] [address] [place] [address] [phone]        Trinitie Mcgirr, Canary Brim, MD 04/24/22 2025

## 2022-04-24 NOTE — ED Triage Notes (Addendum)
Pt c/o lower back after trying to move a 120 lb dumb bell today; took ibuprofen at 1600

## 2022-04-24 NOTE — Discharge Instructions (Signed)
Your history, exam, and evaluation today are consistent with a right low back muscle spasm and strain after lifting the 120 pound dumbbell.  Given the lack of focal trauma and the lack of midline tenderness on exam, we agreed to hold on imaging today however with the spasm we felt and your symptoms, we do want to give you prescription for the numbing medicine, muscle medicine, and steroids to help with any nerve inflammation given it radiating down your right leg slightly.  As you had no focal neurologic deficits again we agreed to hold on transfer at this time however we do want you to follow-up with a back doctor if symptoms persist.  Please watch your glucoses given your diabetes with the steroids as you report you have tolerated steroids before we felt this was reasonable to help.  Please follow-up and if any symptoms change or worsen acutely, please return to the nearest emergency department.

## 2022-04-29 DIAGNOSIS — G4733 Obstructive sleep apnea (adult) (pediatric): Secondary | ICD-10-CM | POA: Diagnosis not present

## 2022-05-19 ENCOUNTER — Emergency Department (HOSPITAL_BASED_OUTPATIENT_CLINIC_OR_DEPARTMENT_OTHER)
Admission: EM | Admit: 2022-05-19 | Discharge: 2022-05-19 | Disposition: A | Discharge status: Home / Self Care | Payer: BC Managed Care – PPO | Attending: Emergency Medicine | Admitting: Emergency Medicine

## 2022-05-19 ENCOUNTER — Emergency Department (HOSPITAL_BASED_OUTPATIENT_CLINIC_OR_DEPARTMENT_OTHER): Payer: BC Managed Care – PPO

## 2022-05-19 ENCOUNTER — Encounter (HOSPITAL_BASED_OUTPATIENT_CLINIC_OR_DEPARTMENT_OTHER): Payer: Self-pay | Admitting: Radiology

## 2022-05-19 ENCOUNTER — Other Ambulatory Visit: Payer: Self-pay

## 2022-05-19 DIAGNOSIS — R079 Chest pain, unspecified: Secondary | ICD-10-CM | POA: Diagnosis not present

## 2022-05-19 DIAGNOSIS — E119 Type 2 diabetes mellitus without complications: Secondary | ICD-10-CM | POA: Insufficient documentation

## 2022-05-19 DIAGNOSIS — J40 Bronchitis, not specified as acute or chronic: Secondary | ICD-10-CM

## 2022-05-19 DIAGNOSIS — J45909 Unspecified asthma, uncomplicated: Secondary | ICD-10-CM | POA: Insufficient documentation

## 2022-05-19 DIAGNOSIS — R0789 Other chest pain: Secondary | ICD-10-CM | POA: Diagnosis not present

## 2022-05-19 LAB — BASIC METABOLIC PANEL
Anion gap: 9 (ref 5–15)
BUN: 14 mg/dL (ref 6–20)
CO2: 26 mmol/L (ref 22–32)
Calcium: 8.5 mg/dL — ABNORMAL LOW (ref 8.9–10.3)
Chloride: 102 mmol/L (ref 98–111)
Creatinine, Ser: 1.23 mg/dL (ref 0.61–1.24)
GFR, Estimated: >60 mL/min (ref >60)
Glucose, Bld: 114 mg/dL — ABNORMAL HIGH (ref 70–99)
Potassium: 3.9 mmol/L (ref 3.5–5.1)
Sodium: 137 mmol/L (ref 135–145)

## 2022-05-19 LAB — CBC
HCT: 44.1 % (ref 39.0–52.0)
Hemoglobin: 14.7 g/dL (ref 13.0–17.0)
MCH: 28.8 pg (ref 26.0–34.0)
MCHC: 33.3 g/dL (ref 30.0–36.0)
MCV: 86.5 fL (ref 80.0–100.0)
Platelets: 265 10*3/uL (ref 150–400)
RBC: 5.1 MIL/uL (ref 4.22–5.81)
RDW: 13.4 % (ref 11.5–15.5)
WBC: 7.9 10*3/uL (ref 4.0–10.5)
nRBC: 0 % (ref 0.0–0.2)

## 2022-05-19 LAB — TROPONIN I (HIGH SENSITIVITY): Troponin I (High Sensitivity): 3 ng/L (ref <18)

## 2022-05-19 NOTE — Discharge Instructions (Signed)
You were evaluated in the Emergency Department and after careful evaluation, we did not find any emergent condition requiring admission or further testing in the hospital.  Your exam/testing today is overall reassuring.  No signs of pneumonia or heart damage.  Continue over-the-counter medications, Tylenol and Motrin, plenty of fluids.  Symptoms likely due to a virus causing bronchitis.  Please return to the Emergency Department if you experience any worsening of your condition.   Thank you for allowing [place] to be a part of your care.

## 2022-05-19 NOTE — ED Provider Notes (Signed)
[facility] Emergency Department Provider Note MRN:  [id number]  Arrival date & time: 05/19/22     Chief Complaint   Chest Pain   History of Present Illness   Derrick Scott is a 47 y.o. year-old male with a history of diabetes presenting to the ED with chief complaint of chest pain.  Cough with productive sputum for the past few days.  This evening with some chest fullness and trouble breathing when laying flat, here for evaluation.  Worried he has pneumonia.  Review of Systems  A thorough review of systems was obtained and all systems are negative except as noted in the HPI and PMH.   Patient's Health History    Past Medical History:  Diagnosis Date   Arthritis    knee bilateral   Asthma    as a child "NO attacks"   Diabetes mellitus without complication (HCC)    border line no medicationa   GERD (gastroesophageal reflux disease)    High cholesterol    Medial meniscus tear 01/2012   right knee   Obesity    Pneumonia    Renal disorder    hematuria,kidney stones   Sleep apnea    Vertigo, benign paroxysmal 2015    Past Surgical History:  Procedure Laterality Date   CORNEAL TRANSPLANT  12/2009   KNEE ARTHROSCOPY  02/20/2012   Procedure: ARTHROSCOPY KNEE;  Surgeon: Harvie Junior, MD;  Location: [facility];  Service: Orthopedics;  Laterality: Right;  Chondroplasia patella femoral joint, medial meniscal debriedment    Family History  Problem Relation Age of Onset   Asthma Mother    Diabetes Father    Cancer Father        prostate   Osteoarthritis Sister    Diabetes Sister    Colon cancer Neg Hx    Colon polyps Neg Hx    Esophageal cancer Neg Hx    Rectal cancer Neg Hx    Stomach cancer Neg Hx     Social History   Socioeconomic History   Marital status: Married    Spouse name: Not on file   Number of children: Not on file   Years of education: Not on file   Highest education level: Not on file  Occupational  History   Not on file  Tobacco Use   Smoking status: Never   Smokeless tobacco: Never  Vaping Use   Vaping Use: Never used  Substance and Sexual Activity   Alcohol use: No   Drug use: No   Sexual activity: Not Currently  Other Topics Concern   Not on file  Social History Narrative   Right handed   Drinks caffeine   One story home   Social Determinants of Health   Financial Resource Strain: Not on file  Food Insecurity: Not on file  Transportation Needs: Not on file  Physical Activity: Not on file  Stress: Not on file  Social Connections: Not on file  Intimate Partner Violence: Not on file     Physical Exam   Vitals:   05/19/22 0144 05/19/22 0147  BP:  (!) 151/104  Pulse:  92  Resp:  18  Temp:  98.5 F (36.9 C)  SpO2: 97% 97%    CONSTITUTIONAL: Well-appearing, NAD NEURO/PSYCH:  Alert and oriented x 3, no focal deficits EYES:  eyes equal and reactive ENT/NECK:  no LAD, no JVD CARDIO: Regular rate, well-perfused, normal S1 and S2 PULM:  CTAB no wheezing or rhonchi  GI/GU:  non-distended, non-tender MSK/SPINE:  No gross deformities, no edema SKIN:  no rash, atraumatic   *Additional and/or pertinent findings included in MDM below  Diagnostic and Interventional Summary    EKG Interpretation  Date/Time:  Thursday May 19 2022 01:51:36 EDT Ventricular Rate:  96 PR Interval:  169 QRS Duration: 90 QT Interval:  338 QTC Calculation: 428 R Axis:   -66 Text Interpretation: Sinus rhythm Left anterior fascicular block RSR' in V1 or V2, right VCD or RVH Confirmed by Kennis Carina [id number] on 05/19/2022 2:48:03 AM       Labs Reviewed  BASIC METABOLIC PANEL - Abnormal; Notable for the following components:      Result Value   Glucose, Bld 114 (*)    Calcium 8.5 (*)    All other components within normal limits  CBC  TROPONIN I (HIGH SENSITIVITY)    DG Chest 2 View  Final Result      Medications - No data to display   Procedures  /  Critical  Care Procedures  ED Course and Medical Decision Making  Initial Impression and Ddx Favoring bronchitis or viral illness versus pneumonia, doubt ACS or PE or any other emergent cardiopulmonary process.  Past medical/surgical history that increases complexity of ED encounter: Diabetes  Interpretation of Diagnostics I personally reviewed the EKG and my interpretation is as follows: Sinus rhythm without concerning ischemic features  Normal, labs reassuring with no significant blood count or electrolyte disturbance, troponin negative  Patient Reassessment and Ultimate Disposition/Management     Discharge  Patient management required discussion with the following services or consulting groups:  None  Complexity of Problems Addressed Acute complicated illness or Injury  Additional Data Reviewed and Analyzed Further history obtained from: None  Additional Factors Impacting ED Encounter Risk None  Elmer Sow. Pilar Plate, MD [facility] Emergency Medicine [facility] [email or web address]  Final Clinical Impressions(s) / ED Diagnoses     ICD-10-CM   1. Bronchitis  J40       ED Discharge Orders     None        Discharge Instructions Discussed with and Provided to Patient:    Discharge Instructions      You were evaluated in the Emergency Department and after careful evaluation, we did not find any emergent condition requiring admission or further testing in the hospital.  Your exam/testing today is overall reassuring.  No signs of pneumonia or heart damage.  Continue over-the-counter medications, Tylenol and Motrin, plenty of fluids.  Symptoms likely due to a virus causing bronchitis.  Please return to the Emergency Department if you experience any worsening of your condition.   Thank you for allowing [place] to be a part of your care.      Sabas Sous, MD 05/19/22 [phone]

## 2022-05-19 NOTE — ED Triage Notes (Signed)
Pt states he has had chest congestion and coughing up phlegm since Saturday. Pt states that he has been taking medication for it but now when he lays down he has chest pressure and back pain.

## 2022-05-20 ENCOUNTER — Other Ambulatory Visit: Payer: Self-pay

## 2022-05-20 ENCOUNTER — Other Ambulatory Visit: Payer: Self-pay | Admitting: Nurse Practitioner

## 2022-05-20 DIAGNOSIS — K219 Gastro-esophageal reflux disease without esophagitis: Secondary | ICD-10-CM

## 2022-05-27 ENCOUNTER — Other Ambulatory Visit: Payer: Self-pay

## 2022-05-27 ENCOUNTER — Telehealth: Payer: Self-pay | Admitting: Nurse Practitioner

## 2022-05-27 DIAGNOSIS — I10 Essential (primary) hypertension: Secondary | ICD-10-CM

## 2022-05-27 MED ORDER — LOSARTAN POTASSIUM 25 MG PO TABS: 25.0000 mg | ORAL_TABLET | Freq: Every day | ORAL | 5 refills | Status: DC

## 2022-05-27 NOTE — Telephone Encounter (Signed)
Prescription Request  05/27/2022  LOV: 03/30/2022  What is the name of the medication or equipment? losartan (COZAAR) 25 MG tablet pt is out of medication  Have you contacted your pharmacy to request a refill? Yes  They told pt we have not responded. I do not see electronic request.  Which pharmacy would you like this sent to?  [id number] [place], [place] - [address] [place] [address] Phone: [phone] Fax: [phone]   Patient notified that their request is being sent to the clinical staff for review and that they should receive a response within 2 business days.   Please advise at Mobile [phone] (mobile)

## 2022-05-27 NOTE — Telephone Encounter (Signed)
Sent 05/27/22

## 2022-05-29 DIAGNOSIS — G4733 Obstructive sleep apnea (adult) (pediatric): Secondary | ICD-10-CM | POA: Diagnosis not present

## 2022-05-31 ENCOUNTER — Encounter: Payer: Self-pay | Admitting: Physician Assistant

## 2022-05-31 ENCOUNTER — Ambulatory Visit (INDEPENDENT_AMBULATORY_CARE_PROVIDER_SITE_OTHER): Payer: BC Managed Care – PPO | Admitting: Physician Assistant

## 2022-05-31 VITALS — BP 118/78 | HR 93 | Ht 68.0 in | Wt 286.0 lb

## 2022-05-31 DIAGNOSIS — K219 Gastro-esophageal reflux disease without esophagitis: Secondary | ICD-10-CM

## 2022-05-31 DIAGNOSIS — R1012 Left upper quadrant pain: Secondary | ICD-10-CM | POA: Diagnosis not present

## 2022-05-31 DIAGNOSIS — R110 Nausea: Secondary | ICD-10-CM | POA: Diagnosis not present

## 2022-05-31 DIAGNOSIS — K920 Hematemesis: Secondary | ICD-10-CM

## 2022-05-31 DIAGNOSIS — R1310 Dysphagia, unspecified: Secondary | ICD-10-CM | POA: Diagnosis not present

## 2022-05-31 DIAGNOSIS — R1011 Right upper quadrant pain: Secondary | ICD-10-CM

## 2022-05-31 MED ORDER — OMEPRAZOLE 40 MG PO CPDR: 40.0000 mg | DELAYED_RELEASE_CAPSULE | Freq: Two times a day (BID) | ORAL | 5 refills | Status: DC

## 2022-05-31 NOTE — Patient Instructions (Signed)
We have sent the following medications to your pharmacy for you to pick up at your convenience: Omeprazole 40 mg twice daily 30-60 minutes before breakfast and dinner.   You have been scheduled for an abdominal ultrasound at [facility] (1st floor of hospital) on Tuesday 06/06/22 at 9:30 am. Please arrive 30 minutes prior to your appointment for registration. Make certain not to have anything to eat or drink 6 hours prior to your appointment. Should you need to reschedule your appointment, please contact radiology at [phone]. This test typically takes about 30 minutes to perform.  You have been scheduled for an endoscopy. Please follow written instructions given to you at your visit today. If you use inhalers (even only as needed), please bring them with you on the day of your procedure.  _______________________________________________________  If your blood pressure at your visit was 140/90 or greater, please contact your primary care physician to follow up on this.  _______________________________________________________  If you are age 26 or older, your body mass index should be between 23-30. Your Body mass index is 43.49 kg/m. If this is out of the aforementioned range listed, please consider follow up with your Primary Care Provider.  If you are age 76 or younger, your body mass index should be between 19-25. Your Body mass index is 43.49 kg/m. If this is out of the aformentioned range listed, please consider follow up with your Primary Care Provider.   ________________________________________________________  The  GI providers would like to encourage you to use [facility] to communicate with providers for non-urgent requests or questions.  Due to long hold times on the telephone, sending your provider a message by [facility] may be a faster and more efficient way to get a response.  Please allow 48 business hours for a response.  Please remember that this is for  non-urgent requests.  _______________________________________________________

## 2022-05-31 NOTE — Progress Notes (Signed)
Assessment and plans noted ?

## 2022-05-31 NOTE — Progress Notes (Signed)
Chief Complaint: Nausea and vomiting, abdominal pain  HPI:    Derrick Scott is a 47 year old African-American male with past medical history as listed below including diabetes, GERD, obesity and multiple others, known to Dr. Marina Goodell, who was referred to me by Nche, Bonna Gains, NP for a complaint of nausea and vomiting and abdominal pain.      03/03/2018 CT renal stone study with no acute abnormality.  Gallbladder normal.    02/23/2021 screening colonoscopy with 2 1-3 mm polyps in the descending and ascending colon, diverticulosis in the left and right colon otherwise normal.  Pathology showed adenomatous polyps and repeat recommended in 7 years.    03/30/2022 office visit with PCP.  At that point patient had a switch from Ozempic to [place].    03/31/2022 hemoglobin A1c 6.7.    05/19/2022 chest x-ray was negative.  BMP that day with a glucose of 114 and otherwise normal.  CBC normal.    Today, patient presents to clinic with multiple complaints.  He does has chronic reflux symptoms for which he is using Omeprazole 20 mg twice a day which works for the most part but he continues with symptoms of nausea when he eats and occasional vomiting.  Tells me that when he does vomit he often sees bright red blood in his vomitus.  Also feels like food goes down and sits in his throat and sometimes he has to regurgitate it.  This has been increasing over the past couple of years.  He did not start Ozempic until about 6 months ago which has since been changed to [facility], symptoms have gotten somewhat worse recently.    Also describes a pain in his left upper quadrant today which he tells me was previously evaluated in 2018/19 with imaging, CT renal stone study as above is the only thing I can find.  He tells me that they told him nothing was there but he feels a burning sensation in this area that just comes on sometimes randomly and other times after he eats and eventually goes away on its own.  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    Denies fever, chills, weight loss, blood in his stool or symptoms that awaken him from sleep.  Past Medical History:  Diagnosis Date   Arthritis    knee bilateral   Asthma    as a child "NO attacks"   Diabetes mellitus without complication (HCC)    border line no medicationa   GERD (gastroesophageal reflux disease)    High cholesterol    Medial meniscus tear 01/2012   right knee   Obesity    Pneumonia    Renal disorder    hematuria,kidney stones   Sleep apnea    Vertigo, benign paroxysmal 2015    Past Surgical History:  Procedure Laterality Date   CORNEAL TRANSPLANT  12/2009   KNEE ARTHROSCOPY  02/20/2012   Procedure: ARTHROSCOPY KNEE;  Surgeon: Harvie Junior, MD;  Location: [facility];  Service: Orthopedics;  Laterality: Right;  Chondroplasia patella femoral joint, medial meniscal debriedment    Current Outpatient Medications  Medication Sig Dispense Refill   atorvastatin (LIPITOR) 40 MG tablet Take 1 tablet (40 mg total) by mouth daily. 90 tablet 3   cetirizine (ZYRTEC) 10 MG tablet Take 1 tablet (10 mg total) by mouth daily. 10 tablet 0   cyclobenzaprine (FLEXERIL) 10 MG tablet Take 1  tablet (10 mg total) by mouth 2 (two) times daily as needed for muscle spasms. 20 tablet 0   lidocaine (LIDODERM) 5 % Place 1 patch onto the skin daily. Remove & Discard patch within 12 hours or as directed by MD 15 patch 0   losartan (COZAAR) 25 MG tablet Take 1 tablet (25 mg total) by mouth daily. 30 tablet 5   meloxicam (MOBIC) 15 MG tablet Take 15 mg by mouth daily.     metFORMIN (GLUCOPHAGE) 500 MG tablet Take 0.5 tablets (250 mg total) by mouth 2 (two) times daily with a meal. 90 tablet 3   methylPREDNISolone (MEDROL DOSEPAK) 4 MG TBPK tablet Please use medication as directed on Dosepak 21 each 0   omeprazole (PRILOSEC) 20 MG capsule TAKE 1 CAPSULE BY MOUTH EVERY DAY 90 capsule 0    tirzepatide (MOUNJARO) 2.5 MG/0.5ML Pen Inject 2.5 mg into the skin once a week. 6 mL 1   No current facility-administered medications for this visit.    Allergies as of 05/31/2022 - Review Complete 05/19/2022  Allergen Reaction Noted   Shrimp [shellfish allergy] Anaphylaxis 09/05/2019    Family History  Problem Relation Age of Onset   Asthma Mother    Diabetes Father    Cancer Father        prostate   Osteoarthritis Sister    Diabetes Sister    Colon cancer Neg Hx    Colon polyps Neg Hx    Esophageal cancer Neg Hx    Rectal cancer Neg Hx    Stomach cancer Neg Hx     Social History   Socioeconomic History   Marital status: Married    Spouse name: Not on file   Number of children: Not on file   Years of education: Not on file   Highest education level: Not on file  Occupational History   Not on file  Tobacco Use   Smoking status: Never   Smokeless tobacco: Never  Vaping Use   Vaping Use: Never used  Substance and Sexual Activity   Alcohol use: No   Drug use: No   Sexual activity: Not Currently  Other Topics Concern   Not on file  Social History Narrative   Right handed   Drinks caffeine   One story home   Social Determinants of Health   Financial Resource Strain: Not on file  Food Insecurity: Not on file  Transportation Needs: Not on file  Physical Activity: Not on file  Stress: Not on file  Social Connections: Not on file  Intimate Partner Violence: Not on file    Review of Systems:    Constitutional: No weight loss, fever or chills Skin: No rash  Cardiovascular: No chest pain   Respiratory: No SOB  Gastrointestinal: See HPI and otherwise negative Genitourinary: No dysuria Neurological: No headache, dizziness or syncope Musculoskeletal: No new muscle or joint pain Hematologic: No bleeding Psychiatric: No history of depression or anxiety   Physical Exam:  Vital signs: BP 118/78   Pulse 93   Ht 5\' 8"  (1.727 m)   Wt 286 lb (129.7 kg)    BMI 43.49 kg/m    Constitutional:   Pleasant obese AA male appears to be in NAD, Well developed, Well nourished, alert and cooperative Head:  Normocephalic and atraumatic. Eyes:   PEERL, EOMI. No icterus. Conjunctiva pink. Ears:  Normal auditory acuity. Neck:  Supple Throat: Oral cavity and pharynx without inflammation, swelling or lesion.  Respiratory: Respirations even and unlabored. Lungs clear  to auscultation bilaterally.   No wheezes, crackles, or rhonchi.  Cardiovascular: Normal S1, S2. No MRG. Regular rate and rhythm. No peripheral edema, cyanosis or pallor.  Gastrointestinal:  Soft, nondistended, mild RUQ ttp. No rebound or guarding. Normal bowel sounds. No appreciable masses or hepatomegaly. Rectal:  Not performed.  Msk:  Symmetrical without gross deformities. Without edema, no deformity or joint abnormality.  Neurologic:  Alert and  oriented x4;  grossly normal neurologically.  Skin:   Dry and intact without significant lesions or rashes. Psychiatric: Demonstrates good judgement and reason without abnormal affect or behaviors.  RELEVANT LABS AND IMAGING: CBC    Component Value Date/Time   WBC 7.9 05/19/2022 0200   RBC 5.10 05/19/2022 0200   HGB 14.7 05/19/2022 0200   HCT 44.1 05/19/2022 0200   PLT 265 05/19/2022 0200   MCV 86.5 05/19/2022 0200   MCH 28.8 05/19/2022 0200   MCHC 33.3 05/19/2022 0200   RDW 13.4 05/19/2022 0200   LYMPHSABS 4.2 (H) 01/06/2021 2141   MONOABS 0.5 01/06/2021 2141   EOSABS 0.1 01/06/2021 2141   BASOSABS 0.1 01/06/2021 2141    CMP     Component Value Date/Time   NA 137 05/19/2022 0200   NA 137 05/18/2016 1001   K 3.9 05/19/2022 0200   CL 102 05/19/2022 0200   CO2 26 05/19/2022 0200   GLUCOSE 114 (H) 05/19/2022 0200   BUN 14 05/19/2022 0200   BUN 19 05/18/2016 1001   CREATININE 1.23 05/19/2022 0200   CREATININE 1.26 10/24/2012 1142   CALCIUM 8.5 (L) 05/19/2022 0200   PROT 7.9 11/30/2021 0932   PROT 7.7 05/18/2016 1001   ALBUMIN 4.4  11/30/2021 0932   ALBUMIN 4.2 05/18/2016 1001   AST 24 11/30/2021 0932   ALT 44 11/30/2021 0932   ALKPHOS 91 11/30/2021 0932   BILITOT 0.4 11/30/2021 0932   BILITOT 0.3 05/18/2016 1001   GFRNONAA >60 05/19/2022 0200   GFRAA >60 10/21/2019 0858    Assessment: 1.  Right upper quadrant/left upper quadrant pain: Patient concerned this could be his gallbladder, reviewed CT from 2020 with normal gallbladder, ordered ultrasound today, could also be splenic flexure syndrome 2.  Nausea and vomiting: Worsening over the past 2 years, worsened recently possibly due to Mounjaro/Ozempic added over the past 6 months versus relation to gallbladder versus gastritis 3.  Dysphagia: Often feels food gets stuck on the way down and occasionally has regurgitation; consider esophageal stricture versus other 4.  GERD: Chronic, typically controlled on Omeprazole 20 mg twice daily, but still has some breakthrough and also symptoms of dysphagia nausea vomiting and hematemesis; consider ongoing GERD +/- gastritis +/- esophagitis +/- Mallory-Weiss tears 5.  Hematemesis  Plan: 1.  Scheduled patient for an EGD with dilation in the [facility] tomorrow with Dr. Marina Goodell as he had an opening.  Did provide the patient a detailed list of risks for the procedure and he agrees to proceed.  Patient has not taken his Mounjaro since last Wednesday, 05/25/2022. Patient is appropriate for endoscopic procedure(s) in the ambulatory ([facility]) setting.  2.  Increased Omeprazole to 40 mg twice a day, 30-60 minutes before breakfast and dinner.  #60 with 11 refills 3.  Reviewed antireflux diet and lifestyle modifications. 4.  Discussed with patient I am not sure the left upper quadrant pain that he is experiencing is related to his gallbladder as this would be very abnormal, but he does have some tenderness in the right upper quadrant as well today and he would  like this further evaluated.  Ordered a right upper quadrant ultrasound. 5.  Did discuss that  Mounjaro can exacerbate these GI symptoms of his.  He is aware of how this medication works. 6.  Patient to follow in clinic per recommendations after time of EGD and imaging.  Hyacinth Meeker, PA-C [facility] 05/31/2022, 8:58 AM  Cc: Anne Ng, NP

## 2022-06-01 ENCOUNTER — Encounter: Payer: Self-pay | Admitting: Internal Medicine

## 2022-06-01 ENCOUNTER — Ambulatory Visit (AMBULATORY_SURGERY_CENTER): Payer: BC Managed Care – PPO | Admitting: Internal Medicine

## 2022-06-01 VITALS — BP 160/70 | HR 93 | Temp 96.8°F | Resp 19 | Ht 68.0 in | Wt 286.0 lb

## 2022-06-01 DIAGNOSIS — K219 Gastro-esophageal reflux disease without esophagitis: Secondary | ICD-10-CM

## 2022-06-01 DIAGNOSIS — R1012 Left upper quadrant pain: Secondary | ICD-10-CM

## 2022-06-01 DIAGNOSIS — R1310 Dysphagia, unspecified: Secondary | ICD-10-CM

## 2022-06-01 DIAGNOSIS — R110 Nausea: Secondary | ICD-10-CM | POA: Diagnosis not present

## 2022-06-01 MED ORDER — SODIUM CHLORIDE 0.9 % IV SOLN: 500.0000 mL | Freq: Once | INTRAVENOUS | Status: DC

## 2022-06-01 NOTE — Progress Notes (Signed)
Pt's states no medical or surgical changes since previsit or office visit. 

## 2022-06-01 NOTE — Patient Instructions (Addendum)
- Resume previous diet.                           - Continue present medications.                           - Continue adjusted dose omeprazole at 40 mg twice                            daily                           - Keep plans for abdominal ultrasound. Will contact                            you with results after they are available                           - Weight loss                           - Resume general medical care with your primary                            care provider  YOU HAD AN ENDOSCOPIC PROCEDURE TODAY AT THE [facility]:   Refer to the procedure report that was given to you for any specific questions about what was found during the examination.  If the procedure report does not answer your questions, please call your gastroenterologist to clarify.  If you requested that your care partner not be given the details of your procedure findings, then the procedure report has been included in a sealed envelope for you to review at your convenience later.  YOU SHOULD EXPECT: Some feelings of bloating in the abdomen. Passage of more gas than usual.  Walking can help get rid of the air that was put into your GI tract during the procedure and reduce the bloating. If you had a lower endoscopy (such as a colonoscopy or flexible sigmoidoscopy) you may notice spotting of blood in your stool or on the toilet paper. If you underwent a bowel prep for your procedure, you may not have a normal bowel movement for a few days.  Please Note:  You might notice some irritation and congestion in your nose or some drainage.  This is from the oxygen used during your procedure.  There is no need for concern and it should clear up in a day or so.  SYMPTOMS TO REPORT IMMEDIATELY:   Following upper endoscopy (EGD)  Vomiting of blood or coffee ground material  New chest pain or pain under the shoulder blades  Painful or persistently difficult swallowing  New  shortness of breath  Fever of 100F or higher  Black, tarry-looking stools  For urgent or emergent issues, a gastroenterologist can be reached at any hour by calling [phone]. Do not use MyChart messaging for urgent concerns.    DIET:  We do recommend a small meal at first, but then you may proceed to your regular diet.  Drink plenty of fluids but you should avoid alcoholic beverages for 24 hours.  ACTIVITY:  You should plan to take it easy for  the rest of today and you should NOT DRIVE or use heavy machinery until tomorrow (because of the sedation medicines used during the test).    FOLLOW UP: Our staff will call the number listed on your records the next business day following your procedure.  We will call around 7:15- 8:00 am to check on you and address any questions or concerns that you may have regarding the information given to you following your procedure. If we do not reach you, we will leave a message.     If any biopsies were taken you will be contacted by phone or by letter within the next 1-3 weeks.  Please call us at [phone] if you have not heard about the biopsies in 3 weeks.    SIGNATURES/CONFIDENTIALITY: You and/or your care partner have signed paperwork which will be entered into your electronic medical record.  These signatures attest to the fact that that the information above on your After Visit Summary has been reviewed and is understood.  Full responsibility of the confidentiality of this discharge information lies with you and/or your care-partner.

## 2022-06-01 NOTE — Op Note (Signed)
[facility] Patient Name: Derrick Scott Procedure Date: 06/01/2022 9:48 AM MRN: [id number] Endoscopist: Wilhemina Bonito. Marina Goodell , MD, [id number] Age: 47 Referring MD:  Date of Birth: 02/09/1975 Gender: Male Account #: [id number] Procedure:                Upper GI endoscopy Indications:              Abdominal pain in the left upper quadrant,                            Esophageal reflux, Nausea with vomiting, vague                            dysphagia, scant hematemesis reported. See                            yesterday's office evaluation with the GI advanced                            practitioner Medicines:                Monitored Anesthesia Care Procedure:                Pre-Anesthesia Assessment:                           - Prior to the procedure, a History and Physical                            was performed, and patient medications and                            allergies were reviewed. The patient's tolerance of                            previous anesthesia was also reviewed. The risks                            and benefits of the procedure and the sedation                            options and risks were discussed with the patient.                            All questions were answered, and informed consent                            was obtained. Prior Anticoagulants: The patient has                            taken no anticoagulant or antiplatelet agents. ASA                            Grade Assessment: III - A patient with severe  systemic disease. After reviewing the risks and                            benefits, the patient was deemed in satisfactory                            condition to undergo the procedure.                           After obtaining informed consent, the endoscope was                            passed under direct vision. Throughout the                            procedure, the patient's blood pressure, pulse, and                             oxygen saturations were monitored continuously. The                            GIF [id number] #[id number] was introduced through the                            mouth, and advanced to the second part of duodenum.                            The upper GI endoscopy was accomplished without                            difficulty. The patient tolerated the procedure                            well. Scope In: Scope Out: Findings:                 The esophagus was normal.                           The stomach was normal. Small hiatal hernia.                           The examined duodenum was normal.                           The cardia and gastric fundus were normal on                            retroflexion. Complications:            No immediate complications. Estimated Blood Loss:     Estimated blood loss: none. Impression:               1. Normal EGD                           2. GERD  3. GI complaints likely secondary to GLP-1 agonist                            therapy. Recommendation:           - Patient has a contact number available for                            emergencies. The signs and symptoms of potential                            delayed complications were discussed with the                            patient. Return to normal activities tomorrow.                            Written discharge instructions were provided to the                            patient.                           - Resume previous diet.                           - Continue present medications.                           - Continue adjusted dose omeprazole at 40 mg twice                            daily                           - Keep plans for abdominal ultrasound. Will contact                            you with results after they are available                           - Weight loss                           - Resume general medical care with your primary                             care provider Wilhemina Bonito. Marina Goodell, MD 06/01/2022 10:14:59 AM This report has been signed electronically.

## 2022-06-01 NOTE — Progress Notes (Signed)
Expand All Collapse All    Chief Complaint: Nausea and vomiting, abdominal pain   HPI:    Derrick Scott is a 47 year old African-American male with past medical history as listed below including diabetes, GERD, obesity and multiple others, known to Dr. Marina Goodell, who was referred to me by Nche, Bonna Gains, NP for a complaint of nausea and vomiting and abdominal pain.      03/03/2018 CT renal stone study with no acute abnormality.  Gallbladder normal.    02/23/2021 screening colonoscopy with 2 1-3 mm polyps in the descending and ascending colon, diverticulosis in the left and right colon otherwise normal.  Pathology showed adenomatous polyps and repeat recommended in 7 years.    03/30/2022 office visit with PCP.  At that point patient had a switch from Ozempic to Pana.    03/31/2022 hemoglobin A1c 6.7.    05/19/2022 chest x-ray was negative.  BMP that day with a glucose of 114 and otherwise normal.  CBC normal.    Today, patient presents to clinic with multiple complaints.  He does has chronic reflux symptoms for which he is using Omeprazole 20 mg twice a day which works for the most part but he continues with symptoms of nausea when he eats and occasional vomiting.  Tells me that when he does vomit he often sees bright red blood in his vomitus.  Also feels like food goes down and sits in his throat and sometimes he has to regurgitate it.  This has been increasing over the past couple of years.  He did not start Ozempic until about 6 months ago which has since been changed to [facility], symptoms have gotten somewhat worse recently.    Also describes a pain in his left upper quadrant today which he tells me was previously evaluated in 2018/19 with imaging, CT renal stone study as above is the only thing I can find.  He tells me that they told him nothing was there but he feels a burning sensation in this area that just comes on sometimes randomly and other times after he eats and eventually goes away on its  own.  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    Denies fever, chills, weight loss, blood in his stool or symptoms that awaken him from sleep.       Past Medical History:  Diagnosis Date   Arthritis      knee bilateral   Asthma      as a child "NO attacks"   Diabetes mellitus without complication (HCC)      border line no medicationa   GERD (gastroesophageal reflux disease)     High cholesterol     Medial meniscus tear 01/2012    right knee   Obesity     Pneumonia     Renal disorder      hematuria,kidney stones   Sleep apnea     Vertigo, benign paroxysmal 2015           Past Surgical History:  Procedure Laterality Date   CORNEAL TRANSPLANT   12/2009   KNEE ARTHROSCOPY   02/20/2012    Procedure: ARTHROSCOPY KNEE;  Surgeon: Harvie Junior, MD;  Location: [facility];  Service: Orthopedics;  Laterality: Right;  Chondroplasia patella femoral joint, medial meniscal debriedment            Current Outpatient Medications  Medication Sig Dispense Refill   atorvastatin (  LIPITOR) 40 MG tablet Take 1 tablet (40 mg total) by mouth daily. 90 tablet 3   cetirizine (ZYRTEC) 10 MG tablet Take 1 tablet (10 mg total) by mouth daily. 10 tablet 0   cyclobenzaprine (FLEXERIL) 10 MG tablet Take 1 tablet (10 mg total) by mouth 2 (two) times daily as needed for muscle spasms. 20 tablet 0   lidocaine (LIDODERM) 5 % Place 1 patch onto the skin daily. Remove & Discard patch within 12 hours or as directed by MD 15 patch 0   losartan (COZAAR) 25 MG tablet Take 1 tablet (25 mg total) by mouth daily. 30 tablet 5   meloxicam (MOBIC) 15 MG tablet Take 15 mg by mouth daily.       metFORMIN (GLUCOPHAGE) 500 MG tablet Take 0.5 tablets (250 mg total) by mouth 2 (two) times daily with a meal. 90 tablet 3   methylPREDNISolone (MEDROL DOSEPAK) 4 MG TBPK tablet Please use medication as directed on Dosepak 21 each 0   omeprazole  (PRILOSEC) 20 MG capsule TAKE 1 CAPSULE BY MOUTH EVERY DAY 90 capsule 0   tirzepatide (MOUNJARO) 2.5 MG/0.5ML Pen Inject 2.5 mg into the skin once a week. 6 mL 1    No current facility-administered medications for this visit.           Allergies as of 05/31/2022 - Review Complete 05/19/2022  Allergen Reaction Noted   Shrimp [shellfish allergy] Anaphylaxis 09/05/2019           Family History  Problem Relation Age of Onset   Asthma Mother     Diabetes Father     Cancer Father          prostate   Osteoarthritis Sister     Diabetes Sister     Colon cancer Neg Hx     Colon polyps Neg Hx     Esophageal cancer Neg Hx     Rectal cancer Neg Hx     Stomach cancer Neg Hx        Social History         Socioeconomic History   Marital status: Married      Spouse name: Not on file   Number of children: Not on file   Years of education: Not on file   Highest education level: Not on file  Occupational History   Not on file  Tobacco Use   Smoking status: Never   Smokeless tobacco: Never  Vaping Use   Vaping Use: Never used  Substance and Sexual Activity   Alcohol use: No   Drug use: No   Sexual activity: Not Currently  Other Topics Concern   Not on file  Social History Narrative    Right handed    Drinks caffeine    One story home    Social Determinants of Health    Financial Resource Strain: Not on file  Food Insecurity: Not on file  Transportation Needs: Not on file  Physical Activity: Not on file  Stress: Not on file  Social Connections: Not on file  Intimate Partner Violence: Not on file      Review of Systems:    Constitutional: No weight loss, fever or chills Skin: No rash  Cardiovascular: No chest pain   Respiratory: No SOB  Gastrointestinal: See HPI and otherwise negative Genitourinary: No dysuria Neurological: No headache, dizziness or syncope Musculoskeletal: No new muscle or joint pain Hematologic: No bleeding Psychiatric: No history of depression  or anxiety    Physical Exam:  Vital signs: BP 118/78   Pulse 93   Ht 5\' 8"  (1.727 m)   Wt 286 lb (129.7 kg)   BMI 43.49 kg/m     Constitutional:   Pleasant obese AA male appears to be in NAD, Well developed, Well nourished, alert and cooperative Head:  Normocephalic and atraumatic. Eyes:   PEERL, EOMI. No icterus. Conjunctiva pink. Ears:  Normal auditory acuity. Neck:  Supple Throat: Oral cavity and pharynx without inflammation, swelling or lesion.  Respiratory: Respirations even and unlabored. Lungs clear to auscultation bilaterally.   No wheezes, crackles, or rhonchi.  Cardiovascular: Normal S1, S2. No MRG. Regular rate and rhythm. No peripheral edema, cyanosis or pallor.  Gastrointestinal:  Soft, nondistended, mild RUQ ttp. No rebound or guarding. Normal bowel sounds. No appreciable masses or hepatomegaly. Rectal:  Not performed.  Msk:  Symmetrical without gross deformities. Without edema, no deformity or joint abnormality.  Neurologic:  Alert and  oriented x4;  grossly normal neurologically.  Skin:   Dry and intact without significant lesions or rashes. Psychiatric: Demonstrates good judgement and reason without abnormal affect or behaviors.   RELEVANT LABS AND IMAGING: CBC Labs (Brief)          Component Value Date/Time    WBC 7.9 05/19/2022 0200    RBC 5.10 05/19/2022 0200    HGB 14.7 05/19/2022 0200    HCT 44.1 05/19/2022 0200    PLT 265 05/19/2022 0200    MCV 86.5 05/19/2022 0200    MCH 28.8 05/19/2022 0200    MCHC 33.3 05/19/2022 0200    RDW 13.4 05/19/2022 0200    LYMPHSABS 4.2 (H) 01/06/2021 2141    MONOABS 0.5 01/06/2021 2141    EOSABS 0.1 01/06/2021 2141    BASOSABS 0.1 01/06/2021 2141        CMP     Labs (Brief)          Component Value Date/Time    NA 137 05/19/2022 0200    NA 137 05/18/2016 1001    K 3.9 05/19/2022 0200    CL 102 05/19/2022 0200    CO2 26 05/19/2022 0200    GLUCOSE 114 (H) 05/19/2022 0200    BUN 14 05/19/2022 0200    BUN 19  05/18/2016 1001    CREATININE 1.23 05/19/2022 0200    CREATININE 1.26 10/24/2012 1142    CALCIUM 8.5 (L) 05/19/2022 0200    PROT 7.9 11/30/2021 0932    PROT 7.7 05/18/2016 1001    ALBUMIN 4.4 11/30/2021 0932    ALBUMIN 4.2 05/18/2016 1001    AST 24 11/30/2021 0932    ALT 44 11/30/2021 0932    ALKPHOS 91 11/30/2021 0932    BILITOT 0.4 11/30/2021 0932    BILITOT 0.3 05/18/2016 1001    GFRNONAA >60 05/19/2022 0200    GFRAA >60 10/21/2019 0858        Assessment: 1.  Right upper quadrant/left upper quadrant pain: Patient concerned this could be his gallbladder, reviewed CT from 2020 with normal gallbladder, ordered ultrasound today, could also be splenic flexure syndrome 2.  Nausea and vomiting: Worsening over the past 2 years, worsened recently possibly due to Mounjaro/Ozempic added over the past 6 months versus relation to gallbladder versus gastritis 3.  Dysphagia: Often feels food gets stuck on the way down and occasionally has regurgitation; consider esophageal stricture versus other 4.  GERD: Chronic, typically controlled on Omeprazole 20 mg twice daily, but still has some breakthrough and also symptoms of dysphagia nausea vomiting and  hematemesis; consider ongoing GERD +/- gastritis +/- esophagitis +/- Mallory-Weiss tears 5.  Hematemesis   Plan: 1.  Scheduled patient for an EGD with possible dilation in the [facility] tomorrow with Dr. Marina Goodell as he had an opening.  Did provide the patient a detailed list of risks for the procedure and he agrees to proceed.  Patient has not taken his Mounjaro since last Wednesday, 05/25/2022. Patient is appropriate for endoscopic procedure(s) in the ambulatory ([facility]) setting.  2.  Increased Omeprazole to 40 mg twice a day, 30-60 minutes before breakfast and dinner.  #60 with 11 refills 3.  Reviewed antireflux diet and lifestyle modifications. 4.  Discussed with patient I am not sure the left upper quadrant pain that he is experiencing is related to his gallbladder  as this would be very abnormal, but he does have some tenderness in the right upper quadrant as well today and he would like this further evaluated.  Ordered a right upper quadrant ultrasound. 5.  Did discuss that Mounjaro can exacerbate these GI symptoms of his.  He is aware of how this medication works. 6.  Patient to follow in clinic per recommendations after time of EGD and imaging.   Hyacinth Meeker, PA-C [facility] 05/31/2022, 8:58 AM

## 2022-06-01 NOTE — Progress Notes (Signed)
Vss nad trans to pacu 

## 2022-06-02 ENCOUNTER — Telehealth: Payer: Self-pay | Admitting: *Deleted

## 2022-06-02 NOTE — Telephone Encounter (Signed)
  Follow up Call-     06/01/2022    8:54 AM 02/23/2021    7:34 AM  Call back number  Post procedure Call Back phone  # [phone]  Permission to leave phone message Yes Yes     Patient questions:  Do you have a fever, pain , or abdominal swelling? No. Pain Score  0 *  Have you tolerated food without any problems? Yes.    Have you been able to return to your normal activities? Yes.    Do you have any questions about your discharge instructions: Diet   No. Medications  No. Follow up visit  No.  Do you have questions or concerns about your Care? No.  Actions: * If pain score is 4 or above: No action needed, pain <4.

## 2022-06-06 ENCOUNTER — Ambulatory Visit (HOSPITAL_COMMUNITY)
Admission: RE | Admit: 2022-06-06 | Discharge: 2022-06-06 | Disposition: A | Discharge status: Home / Self Care | Payer: BC Managed Care – PPO | Source: Ambulatory Visit | Attending: Physician Assistant | Admitting: Physician Assistant

## 2022-06-06 DIAGNOSIS — R110 Nausea: Secondary | ICD-10-CM | POA: Insufficient documentation

## 2022-06-06 DIAGNOSIS — R1310 Dysphagia, unspecified: Secondary | ICD-10-CM | POA: Insufficient documentation

## 2022-06-06 DIAGNOSIS — R109 Unspecified abdominal pain: Secondary | ICD-10-CM | POA: Diagnosis not present

## 2022-06-06 DIAGNOSIS — K920 Hematemesis: Secondary | ICD-10-CM

## 2022-06-06 DIAGNOSIS — R1012 Left upper quadrant pain: Secondary | ICD-10-CM

## 2022-06-08 ENCOUNTER — Encounter: Payer: Self-pay | Admitting: Nurse Practitioner

## 2022-06-08 DIAGNOSIS — R053 Chronic cough: Secondary | ICD-10-CM

## 2022-06-15 ENCOUNTER — Telehealth: Payer: Self-pay | Admitting: Nurse Practitioner

## 2022-06-15 NOTE — Telephone Encounter (Signed)
Pt an updated referral to [facility] [phone]. Pt said his last one ran out

## 2022-06-16 ENCOUNTER — Telehealth: Payer: Self-pay | Admitting: Nurse Practitioner

## 2022-06-16 NOTE — Telephone Encounter (Signed)
Called patient back and made him aware of [facility]'s instructions.

## 2022-06-16 NOTE — Telephone Encounter (Signed)
He would like a work note for today.  He was unable to work due to vertigo

## 2022-06-17 ENCOUNTER — Encounter: Payer: Self-pay | Admitting: Nurse Practitioner

## 2022-06-17 ENCOUNTER — Ambulatory Visit (INDEPENDENT_AMBULATORY_CARE_PROVIDER_SITE_OTHER): Payer: BC Managed Care – PPO | Admitting: Nurse Practitioner

## 2022-06-17 VITALS — BP 130/92 | HR 90 | Temp 98.9°F | Resp 16 | Ht 68.0 in | Wt 281.0 lb

## 2022-06-17 DIAGNOSIS — J069 Acute upper respiratory infection, unspecified: Secondary | ICD-10-CM

## 2022-06-17 DIAGNOSIS — R42 Dizziness and giddiness: Secondary | ICD-10-CM | POA: Diagnosis not present

## 2022-06-17 LAB — POC COVID19 BINAXNOW: SARS Coronavirus 2 Ag: NEGATIVE

## 2022-06-17 MED ORDER — MECLIZINE HCL 25 MG PO TABS
25.0000 mg | ORAL_TABLET | Freq: Three times a day (TID) | ORAL | 0 refills | Status: AC | PRN
Start: 2022-06-17 — End: ?

## 2022-06-17 NOTE — Patient Instructions (Signed)
Maintain adequate oral hydration Schedule appt for vertigo rehab. Take meclizine as prescribed.  Vertigo Vertigo is the feeling that you or your surroundings are moving when they are not. This feeling can come and go at any time. Vertigo often goes away on its own. Vertigo can be dangerous if it occurs while you are doing something that could endanger yourself or others, such as driving or operating machinery. Your health care provider will do tests to try to determine the cause of your vertigo. Tests will also help your health care provider decide how best to treat your condition. Follow these instructions at home: Eating and drinking     Dehydration can make vertigo worse. Drink enough fluid to keep your urine pale yellow. Do not drink alcohol. Activity Return to your normal activities as told by your health care provider. Ask your health care provider what activities are safe for you. In the morning, first sit up on the side of the bed. When you feel okay, stand slowly while you hold onto something until you know that your balance is fine. Move slowly. Avoid sudden body or head movements or certain positions, as told by your health care provider. If you have trouble walking or keeping your balance, try using a cane for stability. If you feel dizzy or unstable, sit down right away. Avoid doing any tasks that would cause danger to you or others if vertigo occurs. Avoid bending down if you feel dizzy. Place items in your home so that they are easy for you to reach without bending or leaning over. Do not drive or use machinery if you feel dizzy. General instructions Take over-the-counter and prescription medicines only as told by your health care provider. Keep all follow-up visits. This is important. Contact a health care provider if: Your medicines do not relieve your vertigo or they make it worse. Your condition gets worse or you develop new symptoms. You have a fever. You develop  nausea or vomiting, or if nausea gets worse. Your family or friends notice any behavioral changes. You have numbness or a prickling and tingling sensation in part of your body. Get help right away if you: Are always dizzy or you faint. Develop severe headaches. Develop a stiff neck. Develop sensitivity to light. Have difficulty moving or speaking. Have weakness in your hands, arms, or legs. Have changes in your hearing or vision. These symptoms may represent a serious problem that is an emergency. Do not wait to see if the symptoms will go away. Get medical help right away. Call your local emergency services (911 in the [place] Do not drive yourself to the hospital. Summary Vertigo is the feeling that you or your surroundings are moving when they are not. Your health care provider will do tests to try to determine the cause of your vertigo. Follow instructions for home care. You may be told to avoid certain tasks, positions, or movements. Contact a health care provider if your medicines do not relieve your symptoms, or if you have a fever, nausea, vomiting, or changes in behavior. Get help right away if you have severe headaches or difficulty speaking, or you develop hearing or vision problems. This information is not intended to replace advice given to you by your health care provider. Make sure you discuss any questions you have with your health care provider. Document Revised: 12/04/2019 Document Reviewed: 12/04/2019 Elsevier Patient Education  2024 [employer].

## 2022-06-17 NOTE — Progress Notes (Signed)
Established Patient Visit  Patient: Derrick Scott   DOB: October 12, 1975   47 y.o. Male  MRN: [id number] Visit Date: 06/17/2022  Subjective:    Chief Complaint  Patient presents with   Dizziness    Started Tuesday,  some nausea and stuffy nose, congestion    Vertigo Denies any new symptoms Nasal congestion is chronic: waxing and waning Possible trigger: poor oral hydration. Normal neuro exam today. Sent meclizine Advised about importance of adequate oral hydration and home vertigo exercise Entered referral to neuro rehab  BP Readings from Last 3 Encounters:  06/17/22 (!) 130/92  06/01/22 (!) 160/70  05/31/22 118/78     Reviewed medical, surgical, and social history today  Medications: Outpatient Medications Prior to Visit  Medication Sig   atorvastatin (LIPITOR) 40 MG tablet Take 1 tablet (40 mg total) by mouth daily.   cetirizine (ZYRTEC) 10 MG tablet Take 1 tablet (10 mg total) by mouth daily.   cyclobenzaprine (FLEXERIL) 10 MG tablet Take 1 tablet (10 mg total) by mouth 2 (two) times daily as needed for muscle spasms.   lidocaine (LIDODERM) 5 % Place 1 patch onto the skin daily. Remove & Discard patch within 12 hours or as directed by MD   losartan (COZAAR) 25 MG tablet Take 1 tablet (25 mg total) by mouth daily.   meloxicam (MOBIC) 15 MG tablet Take 15 mg by mouth daily.   metFORMIN (GLUCOPHAGE) 500 MG tablet Take 0.5 tablets (250 mg total) by mouth 2 (two) times daily with a meal.   omeprazole (PRILOSEC) 40 MG capsule Take 1 capsule (40 mg total) by mouth 2 (two) times daily.   tirzepatide [facility]) 2.5 MG/0.5ML Pen Inject 2.5 mg into the skin once a week.   No facility-administered medications prior to visit.   Reviewed past medical and social history.   ROS per HPI above      Objective:  BP (!) 130/92 (BP Location: Left Arm, Patient Position: Sitting, Cuff Size: Large)   Pulse 90   Temp 98.9 F (37.2 C) (Oral)   Resp 16   Ht 5\' 8"  (1.727  m)   Wt 281 lb (127.5 kg)   SpO2 95%   BMI 42.73 kg/m      Physical Exam Vitals and nursing note reviewed.  HENT:     Head: Normocephalic.     Jaw: There is normal jaw occlusion.     Right Ear: Hearing, tympanic membrane, ear canal and external ear normal.     Left Ear: Hearing, tympanic membrane, ear canal and external ear normal.     Nose: Mucosal edema and congestion present. No rhinorrhea.     Right Nostril: No occlusion.     Left Nostril: No occlusion.     Right Turbinates: Swollen. Not enlarged or pale.     Left Turbinates: Swollen. Not enlarged or pale.     Right Sinus: No maxillary sinus tenderness or frontal sinus tenderness.     Left Sinus: No maxillary sinus tenderness or frontal sinus tenderness.  Eyes:     General: No scleral icterus.    Extraocular Movements: Extraocular movements intact.     Conjunctiva/sclera: Conjunctivae normal.     Pupils: Pupils are equal, round, and reactive to light.  Cardiovascular:     Rate and Rhythm: Normal rate and regular rhythm.     Pulses: Normal pulses.     Heart sounds: Normal heart sounds.  Pulmonary:     Effort: Pulmonary effort is normal.     Breath sounds: Normal breath sounds.  Neurological:     Mental Status: He is alert and oriented to person, place, and time.     Cranial Nerves: No cranial nerve deficit.     Results for orders placed or performed in visit on 06/17/22  POC COVID-19  Result Value Ref Range   SARS Coronavirus 2 Ag Negative Negative      Assessment & Plan:    Problem List Items Addressed This Visit       Other   Vertigo - Primary    Denies any new symptoms Nasal congestion is chronic: waxing and waning Possible trigger: poor oral hydration. Normal neuro exam today. Sent meclizine Advised about importance of adequate oral hydration and home vertigo exercise Entered referral to neuro rehab      Relevant Medications   meclizine (ANTIVERT) 25 MG tablet   Other Relevant Orders   Ambulatory  Referral to Neuro Rehab   Other Visit Diagnoses     Viral upper respiratory tract infection       Relevant Orders   POC COVID-19 (Completed)      Return if symptoms worsen or fail to improve.     Alysia Penna, NP

## 2022-06-17 NOTE — Assessment & Plan Note (Signed)
Denies any new symptoms Nasal congestion is chronic: waxing and waning Possible trigger: poor oral hydration. Normal neuro exam today. Sent meclizine Advised about importance of adequate oral hydration and home vertigo exercise Entered referral to neuro rehab

## 2022-06-21 ENCOUNTER — Other Ambulatory Visit: Payer: Self-pay | Admitting: Nurse Practitioner

## 2022-06-21 DIAGNOSIS — K219 Gastro-esophageal reflux disease without esophagitis: Secondary | ICD-10-CM

## 2022-06-23 NOTE — Telephone Encounter (Signed)
error 

## 2022-06-29 DIAGNOSIS — G4733 Obstructive sleep apnea (adult) (pediatric): Secondary | ICD-10-CM | POA: Diagnosis not present

## 2022-07-05 ENCOUNTER — Ambulatory Visit (INDEPENDENT_AMBULATORY_CARE_PROVIDER_SITE_OTHER): Payer: BC Managed Care – PPO | Admitting: Nurse Practitioner

## 2022-07-05 ENCOUNTER — Encounter: Payer: Self-pay | Admitting: Nurse Practitioner

## 2022-07-05 VITALS — BP 120/84 | HR 90 | Temp 98.3°F | Resp 16 | Ht 68.0 in | Wt 282.8 lb

## 2022-07-05 DIAGNOSIS — E1165 Type 2 diabetes mellitus with hyperglycemia: Secondary | ICD-10-CM

## 2022-07-05 DIAGNOSIS — Z7985 Long-term (current) use of injectable non-insulin antidiabetic drugs: Secondary | ICD-10-CM

## 2022-07-05 DIAGNOSIS — I10 Essential (primary) hypertension: Secondary | ICD-10-CM

## 2022-07-05 DIAGNOSIS — Z6841 Body Mass Index (BMI) 40.0 and over, adult: Secondary | ICD-10-CM

## 2022-07-05 LAB — POCT GLYCOSYLATED HEMOGLOBIN (HGB A1C): Hemoglobin A1C: 6.1 % — AB (ref 4.0–5.6)

## 2022-07-05 NOTE — Assessment & Plan Note (Signed)
BP at goal with losartan No adverse effects noted BP Readings from Last 3 Encounters:  07/05/22 120/84  06/17/22 (!) 130/92  06/01/22 (!) 160/70    Encouraged to incorporate cardio exercise per week Maintain med dose F/up in 6months

## 2022-07-05 NOTE — Patient Instructions (Signed)
No change in medications You will be contacted to schedule appointment with healthy weight management clinic

## 2022-07-05 NOTE — Progress Notes (Signed)
Established Patient Visit  Patient: Derrick Scott   DOB: 11-Mar-1975   46 y.o. Male  MRN: [id number] Visit Date: 07/05/2022  Subjective:    Chief Complaint  Patient presents with   Hypertension   Diabetes    Refill Metformin and increase mounjaro - goes to [facility]    Hypertension BP at goal with losartan No adverse effects noted BP Readings from Last 3 Encounters:  07/05/22 120/84  06/17/22 (!) 130/92  06/01/22 (!) 160/70    Encouraged to incorporate cardio exercise per week Maintain med dose F/up in 6months  DM (diabetes mellitus) (HCC) No glucose check at home No adverse effects with mounjaro Repeat hgbA1c: 6.1%  Maintain metformin and mounjaro dose F/up in 6months  Obesity, morbid (HCC) Minimal weight loss despite diet modification He agreed to ref to healthy weight management clinic Wt Readings from Last 3 Encounters:  07/05/22 282 lb 12.8 oz (128.3 kg)  06/17/22 281 lb (127.5 kg)  06/01/22 286 lb (129.7 kg)     Reviewed medical, surgical, and social history today  Medications: Outpatient Medications Prior to Visit  Medication Sig   atorvastatin (LIPITOR) 40 MG tablet Take 1 tablet (40 mg total) by mouth daily.   cetirizine (ZYRTEC) 10 MG tablet Take 1 tablet (10 mg total) by mouth daily.   cyclobenzaprine (FLEXERIL) 10 MG tablet Take 1 tablet (10 mg total) by mouth 2 (two) times daily as needed for muscle spasms.   lidocaine (LIDODERM) 5 % Place 1 patch onto the skin daily. Remove & Discard patch within 12 hours or as directed by MD   losartan (COZAAR) 25 MG tablet Take 1 tablet (25 mg total) by mouth daily.   meclizine (ANTIVERT) 25 MG tablet Take 1 tablet (25 mg total) by mouth 3 (three) times daily as needed for dizziness (do not use for more than 3days).   meloxicam (MOBIC) 15 MG tablet Take 15 mg by mouth daily.   metFORMIN (GLUCOPHAGE) 500 MG tablet Take 0.5 tablets (250 mg total) by mouth 2 (two) times daily  with a meal.   omeprazole (PRILOSEC) 40 MG capsule Take 1 capsule (40 mg total) by mouth 2 (two) times daily.   tirzepatide [facility]) 2.5 MG/0.5ML Pen Inject 2.5 mg into the skin once a week.   No facility-administered medications prior to visit.   Reviewed past medical and social history.   ROS per HPI above      Objective:  BP 120/84 (BP Location: Left Arm, Patient Position: Sitting, Cuff Size: Large)   Pulse 90   Temp 98.3 F (36.8 C) (Temporal)   Resp 16   Ht 5\' 8"  (1.727 m)   Wt 282 lb 12.8 oz (128.3 kg)   SpO2 96%   BMI 43.00 kg/m      Physical Exam Cardiovascular:     Rate and Rhythm: Normal rate and regular rhythm.     Pulses: Normal pulses.     Heart sounds: Normal heart sounds.  Pulmonary:     Effort: Pulmonary effort is normal.     Breath sounds: Normal breath sounds.  Neurological:     Mental Status: He is alert and oriented to person, place, and time.  Psychiatric:        Mood and Affect: Mood normal.        Behavior: Behavior normal.        Thought Content: Thought content normal.  No results found for any visits on 07/05/22.    Assessment & Plan:    Problem List Items Addressed This Visit       Cardiovascular and Mediastinum   Hypertension    BP at goal with losartan No adverse effects noted BP Readings from Last 3 Encounters:  07/05/22 120/84  06/17/22 (!) 130/92  06/01/22 (!) 160/70    Encouraged to incorporate cardio exercise per week Maintain med dose F/up in 6months        Endocrine   DM (diabetes mellitus) (HCC) - Primary    No glucose check at home No adverse effects with mounjaro Repeat hgbA1c: 6.1%  Maintain metformin and mounjaro dose F/up in 6months      Relevant Orders   POCT glycosylated hemoglobin (Hb A1C)     Other   Obesity, morbid (HCC)    Minimal weight loss despite diet modification He agreed to ref to healthy weight management clinic Wt Readings from Last 3 Encounters:  07/05/22 282 lb  12.8 oz (128.3 kg)  06/17/22 281 lb (127.5 kg)  06/01/22 286 lb (129.7 kg)         Relevant Orders   Amb Ref to Medical Weight Management   Return in about 6 months (around 01/04/2023) for CPE (fasting).     Alysia Penna, NP

## 2022-07-05 NOTE — Assessment & Plan Note (Signed)
Minimal weight loss despite diet modification He agreed to ref to healthy weight management clinic Wt Readings from Last 3 Encounters:  07/05/22 282 lb 12.8 oz (128.3 kg)  06/17/22 281 lb (127.5 kg)  06/01/22 286 lb (129.7 kg)

## 2022-07-05 NOTE — Assessment & Plan Note (Signed)
No glucose check at home No adverse effects with mounjaro Repeat hgbA1c: 6.1%  Maintain metformin and mounjaro dose F/up in 6months

## 2022-07-13 NOTE — Telephone Encounter (Signed)
[facility] is calling stating they do not see pt's for vertigo. He needs to be referred to a neurologist not a neurosurgeon.

## 2022-07-13 NOTE — Telephone Encounter (Signed)
Referral sent to [facility] he is  a current pt there

## 2022-07-19 ENCOUNTER — Other Ambulatory Visit: Payer: Self-pay | Admitting: Family

## 2022-07-19 ENCOUNTER — Encounter: Payer: Self-pay | Admitting: Nurse Practitioner

## 2022-07-19 MED ORDER — TIRZEPATIDE 5 MG/0.5ML ~~LOC~~ SOAJ
5.0000 mg | SUBCUTANEOUS | 1 refills | Status: DC
  Filled 2022-07-19: qty 2, 28d supply, fill #0
  Filled 2022-08-18: qty 2, 28d supply, fill #1
  Filled 2022-09-20: qty 2, 28d supply, fill #2
  Filled 2022-10-18: qty 2, 28d supply, fill #3
  Filled 2022-11-15: qty 2, 28d supply, fill #4
  Filled 2022-12-09: qty 2, 28d supply, fill #5

## 2022-07-20 ENCOUNTER — Other Ambulatory Visit (HOSPITAL_COMMUNITY): Payer: Self-pay

## 2022-07-26 ENCOUNTER — Other Ambulatory Visit (HOSPITAL_COMMUNITY): Payer: Self-pay

## 2022-07-26 ENCOUNTER — Other Ambulatory Visit: Payer: Self-pay

## 2022-07-29 DIAGNOSIS — G4733 Obstructive sleep apnea (adult) (pediatric): Secondary | ICD-10-CM | POA: Diagnosis not present

## 2022-08-07 ENCOUNTER — Encounter: Payer: Self-pay | Admitting: Nurse Practitioner

## 2022-08-24 ENCOUNTER — Ambulatory Visit: Payer: BC Managed Care – PPO | Attending: Physical Therapy | Admitting: Physical Therapy

## 2022-08-29 DIAGNOSIS — G4733 Obstructive sleep apnea (adult) (pediatric): Secondary | ICD-10-CM | POA: Diagnosis not present

## 2022-09-13 ENCOUNTER — Encounter: Payer: Self-pay | Admitting: Internal Medicine

## 2022-09-13 ENCOUNTER — Ambulatory Visit (INDEPENDENT_AMBULATORY_CARE_PROVIDER_SITE_OTHER): Payer: BC Managed Care – PPO | Admitting: Internal Medicine

## 2022-09-13 VITALS — BP 138/88 | HR 80 | Ht 68.0 in | Wt 284.0 lb

## 2022-09-13 DIAGNOSIS — D3501 Benign neoplasm of right adrenal gland: Secondary | ICD-10-CM | POA: Diagnosis not present

## 2022-09-13 LAB — COMPREHENSIVE METABOLIC PANEL
ALT: 30 U/L (ref 0–53)
AST: 20 U/L (ref 0–37)
Albumin: 4.1 g/dL (ref 3.5–5.2)
Alkaline Phosphatase: 83 U/L (ref 39–117)
BUN: 12 mg/dL (ref 6–23)
CO2: 30 mEq/L (ref 19–32)
Calcium: 8.9 mg/dL (ref 8.4–10.5)
Chloride: 101 mEq/L (ref 96–112)
Creatinine, Ser: 1.19 mg/dL (ref 0.40–1.50)
GFR: 72.3 mL/min (ref >60.00)
Glucose, Bld: 119 mg/dL — ABNORMAL HIGH (ref 70–99)
Potassium: 4 mEq/L (ref 3.5–5.1)
Sodium: 138 mEq/L (ref 135–145)
Total Bilirubin: 0.5 mg/dL (ref 0.2–1.2)
Total Protein: 7.9 g/dL (ref 6.0–8.3)

## 2022-09-13 NOTE — Patient Instructions (Signed)
24-Hour Urine Collection   You will be collecting your urine for a 24-hour period of time.  Your timer starts with your first urine of the morning (For example - If you first pee at 9AM, your timer will start at 9AM)  Throw away your first urine of the morning  Collect your urine every time you pee for the next 24 hours STOP your urine collection 24 hours after you started the collection (For example - You would stop at 9AM the day after you started)  

## 2022-09-13 NOTE — Progress Notes (Unsigned)
Name: Derrick Scott  MRN/ DOB: [id number], 1975-09-21    Age/ Sex: 47 y.o., male    PCP: Nche, Bonna Gains, NP   Reason for Endocrinology Evaluation: Right adrenal adenoma     Date of Initial Endocrinology Evaluation: 09/13/2022     HPI: Derrick Scott is a 47 y.o. male with a past medical history of DM, Dyslipidemia and OSA. The patient presented for initial endocrinology clinic visit on 09/13/2022 for consultative assistance with his right adrenal adenoma.   Patient was noted with a right adrenal adenoma on imaging from 2014.  The adrenal gland has been gradually increasing in size from 1.2 cm in 2014 to 2.2 cm in 2022.     Substantial weight gain- No, pt on Mounjaro  Easy bruisbility- no just skin lesions Severe hypertension- no  DM- yes Proximal muscle weakness- no Sudden/ severe headaches-occasional and chronic  Anxiety attacks- no  Cardiac arrhythmias- no Palpitations- occasional  Fluid retention- no  Hypokalemia- no   No recent systemic steroids       HISTORY:  Past Medical History:  Past Medical History:  Diagnosis Date   Arthritis    knee bilateral   Asthma    as a child "NO attacks"   Diabetes mellitus without complication (HCC)    border line no medicationa   GERD (gastroesophageal reflux disease)    High cholesterol    Medial meniscus tear 01/2012   right knee   Obesity    Pneumonia    Renal disorder    hematuria,kidney stones   Sleep apnea    Vertigo, benign paroxysmal 2015   Past Surgical History:  Past Surgical History:  Procedure Laterality Date   CORNEAL TRANSPLANT Left 12/17/2009   KNEE ARTHROSCOPY  02/20/2012   Procedure: ARTHROSCOPY KNEE;  Surgeon: Harvie Junior, MD;  Location: [facility];  Service: Orthopedics;  Laterality: Right;  Chondroplasia patella femoral joint, medial meniscal debriedment    Social History:  reports that he has never smoked. He has never used smokeless tobacco. He reports  that he does not drink alcohol and does not use drugs. Family History: family history includes Asthma in his mother; Diabetes in his father and sister; Osteoarthritis in his sister; Other in his mother; Prostate cancer in his father.   HOME MEDICATIONS: Allergies as of 09/13/2022       Reactions   Shrimp [shellfish Allergy] Anaphylaxis        Medication List        Accurate as of September 13, 2022  7:28 AM. If you have any questions, ask your nurse or doctor.          atorvastatin 40 MG tablet Commonly known as: LIPITOR Take 1 tablet (40 mg total) by mouth daily.   cetirizine 10 MG tablet Commonly known as: ZYRTEC Take 1 tablet (10 mg total) by mouth daily.   cyclobenzaprine 10 MG tablet Commonly known as: FLEXERIL Take 1 tablet (10 mg total) by mouth 2 (two) times daily as needed for muscle spasms.   lidocaine 5 % Commonly known as: Lidoderm Place 1 patch onto the skin daily. Remove & Discard patch within 12 hours or as directed by MD   losartan 25 MG tablet Commonly known as: COZAAR Take 1 tablet (25 mg total) by mouth daily.   meclizine 25 MG tablet Commonly known as: ANTIVERT Take 1 tablet (25 mg total) by mouth 3 (three) times daily as needed for dizziness (do not use for  more than 3days).   meloxicam 15 MG tablet Commonly known as: MOBIC Take 15 mg by mouth daily.   metFORMIN 500 MG tablet Commonly known as: GLUCOPHAGE Take 0.5 tablets (250 mg total) by mouth 2 (two) times daily with a meal.   Mounjaro 5 MG/0.5ML Pen Generic drug: tirzepatide Inject 5 mg into the skin once a week.   omeprazole 40 MG capsule Commonly known as: PRILOSEC Take 1 capsule (40 mg total) by mouth 2 (two) times daily.          REVIEW OF SYSTEMS: A comprehensive ROS was conducted with the patient and is negative except as per HPI and below:  ROS     OBJECTIVE:  VS: There were no vitals taken for this visit.   Wt Readings from Last 3 Encounters:  07/05/22 282 lb  12.8 oz (128.3 kg)  06/17/22 281 lb (127.5 kg)  06/01/22 286 lb (129.7 kg)     EXAM: General: Pt appears well and is in NAD  Eyes: External eye exam normal without stare, lid lag or exophthalmos.  EOM intact.  PERRL.  Neck: General: Supple without adenopathy. Thyroid: Thyroid size normal.  No goiter or nodules appreciated. No thyroid bruit.  Lungs: Clear with good BS bilat   Heart: Auscultation: RRR.  Abdomen: Soft, nontender  Extremities:  BL LE: No pretibial edema   Mental Status: Judgment, insight: Intact Orientation: Oriented to time, place, and person Mood and affect: No depression, anxiety, or agitation     DATA REVIEWED: ***    CT renal 01/28/2020 Adrenals/Urinary Tract: Intermediate density right adrenal nodule which is stable in size in comparison to most recent prior measuring 2.2 cm (series 2, image 23) but has been slowly increasing over multiple years for instance with measured 1.4 cm on May 29, 2013, 1.2 cm on September 29, 2012 and was not present on May 04, 2009. Favor a benign lipid poor adenoma. Kidneys are normal, without renal calculi focal lesion or hydronephrosis. The bladder is predominantly decompressed but otherwise appears normal.   ASSESSMENT/PLAN/RECOMMENDATIONS:   Right adrenal adenoma:    - Eighty five percent of adrenal adenomas are nonsecretory.  - Three forms of adrenal hyperfunction should be considered in patients with adrenal incidentaloma  Glucocorticoid hypersecretion Primary hyperaldosteronism Pheochromocytoma   -Will proceed with 24-hour urine collection for cortisol and metanephrines -Will proceed with Aldo, renin  Follow-up in 1 year  Signed electronically by: Lyndle Herrlich, MD  [facility]  [facility] [address] [place], [place] [address] Phone: [phone] FAX: [phone]   CC: Anne Ng, NP [address] [place] [address] Phone:  [phone] Fax: [phone]   Return to Endocrinology clinic as below: Future Appointments  Date Time Provider Department Center  09/13/2022 10:10 AM Ahmaad Neidhardt, Konrad Dolores, MD [facility] None  10/18/2022 10:30 AM Butch Penny, NP [facility] None  01/04/2023  8:20 AM Nche, Bonna Gains, NP [facility]

## 2022-09-24 ENCOUNTER — Encounter (HOSPITAL_BASED_OUTPATIENT_CLINIC_OR_DEPARTMENT_OTHER): Payer: Self-pay | Admitting: *Deleted

## 2022-09-24 ENCOUNTER — Emergency Department (HOSPITAL_BASED_OUTPATIENT_CLINIC_OR_DEPARTMENT_OTHER): Payer: BC Managed Care – PPO

## 2022-09-24 ENCOUNTER — Other Ambulatory Visit: Payer: Self-pay

## 2022-09-24 ENCOUNTER — Emergency Department (HOSPITAL_BASED_OUTPATIENT_CLINIC_OR_DEPARTMENT_OTHER)
Admission: EM | Admit: 2022-09-24 | Discharge: 2022-09-24 | Disposition: A | Discharge status: Home / Self Care | Payer: BC Managed Care – PPO | Attending: Emergency Medicine | Admitting: Emergency Medicine

## 2022-09-24 DIAGNOSIS — Z79899 Other long term (current) drug therapy: Secondary | ICD-10-CM | POA: Diagnosis not present

## 2022-09-24 DIAGNOSIS — K760 Fatty (change of) liver, not elsewhere classified: Secondary | ICD-10-CM | POA: Diagnosis not present

## 2022-09-24 DIAGNOSIS — Z7984 Long term (current) use of oral hypoglycemic drugs: Secondary | ICD-10-CM | POA: Diagnosis not present

## 2022-09-24 DIAGNOSIS — I10 Essential (primary) hypertension: Secondary | ICD-10-CM | POA: Diagnosis not present

## 2022-09-24 DIAGNOSIS — K5900 Constipation, unspecified: Secondary | ICD-10-CM | POA: Diagnosis not present

## 2022-09-24 DIAGNOSIS — R1032 Left lower quadrant pain: Secondary | ICD-10-CM | POA: Insufficient documentation

## 2022-09-24 DIAGNOSIS — R319 Hematuria, unspecified: Secondary | ICD-10-CM | POA: Insufficient documentation

## 2022-09-24 DIAGNOSIS — E119 Type 2 diabetes mellitus without complications: Secondary | ICD-10-CM | POA: Diagnosis not present

## 2022-09-24 LAB — LIPASE, BLOOD: Lipase: 38 U/L (ref 11–51)

## 2022-09-24 LAB — CBC WITH DIFFERENTIAL/PLATELET
Abs Immature Granulocytes: 0.02 10*3/uL (ref 0.00–0.07)
Basophils Absolute: 0.1 10*3/uL (ref 0.0–0.1)
Basophils Relative: 1 %
Eosinophils Absolute: 0.1 10*3/uL (ref 0.0–0.5)
Eosinophils Relative: 1 %
HCT: 43.2 % (ref 39.0–52.0)
Hemoglobin: 14.6 g/dL (ref 13.0–17.0)
Immature Granulocytes: 0 %
Lymphocytes Relative: 36 %
Lymphs Abs: 2.9 10*3/uL (ref 0.7–4.0)
MCH: 28.8 pg (ref 26.0–34.0)
MCHC: 33.8 g/dL (ref 30.0–36.0)
MCV: 85.2 fL (ref 80.0–100.0)
Monocytes Absolute: 0.6 10*3/uL (ref 0.1–1.0)
Monocytes Relative: 7 %
Neutro Abs: 4.5 10*3/uL (ref 1.7–7.7)
Neutrophils Relative %: 55 %
Platelets: 285 10*3/uL (ref 150–400)
RBC: 5.07 MIL/uL (ref 4.22–5.81)
RDW: 13.2 % (ref 11.5–15.5)
WBC: 8.1 10*3/uL (ref 4.0–10.5)
nRBC: 0 % (ref 0.0–0.2)

## 2022-09-24 LAB — URINALYSIS, MICROSCOPIC (REFLEX)

## 2022-09-24 LAB — COMPREHENSIVE METABOLIC PANEL
ALT: 31 U/L (ref 0–44)
AST: 22 U/L (ref 15–41)
Albumin: 4.1 g/dL (ref 3.5–5.0)
Alkaline Phosphatase: 79 U/L (ref 38–126)
Anion gap: 7 (ref 5–15)
BUN: 15 mg/dL (ref 6–20)
CO2: 28 mmol/L (ref 22–32)
Calcium: 8.9 mg/dL (ref 8.9–10.3)
Chloride: 104 mmol/L (ref 98–111)
Creatinine, Ser: 1.14 mg/dL (ref 0.61–1.24)
GFR, Estimated: >60 mL/min (ref >60)
Glucose, Bld: 113 mg/dL — ABNORMAL HIGH (ref 70–99)
Potassium: 4.2 mmol/L (ref 3.5–5.1)
Sodium: 139 mmol/L (ref 135–145)
Total Bilirubin: 0.5 mg/dL (ref 0.3–1.2)
Total Protein: 8.2 g/dL — ABNORMAL HIGH (ref 6.5–8.1)

## 2022-09-24 LAB — URINALYSIS, ROUTINE W REFLEX MICROSCOPIC
Bilirubin Urine: NEGATIVE
Glucose, UA: NEGATIVE mg/dL
Ketones, ur: NEGATIVE mg/dL
Leukocytes,Ua: NEGATIVE
Nitrite: NEGATIVE
Protein, ur: NEGATIVE mg/dL
Specific Gravity, Urine: 1.02 (ref 1.005–1.030)
pH: 7 (ref 5.0–8.0)

## 2022-09-24 LAB — ALDOSTERONE + RENIN ACTIVITY W/ RATIO
Aldos/Renin Ratio: 2.5 (ref 0.0–30.0)
Aldosterone: 2.1 ng/dL (ref 0.0–30.0)
Renin Activity, Plasma: 0.85 ng/mL/h (ref 0.167–5.380)

## 2022-09-24 MED ORDER — IOHEXOL 300 MG/ML  SOLN
100.0000 mL | Freq: Once | INTRAMUSCULAR | Status: AC | PRN
  Administered 2022-09-24: 100 mL via INTRAVENOUS

## 2022-09-24 MED ORDER — LOSARTAN POTASSIUM 25 MG PO TABS
25.0000 mg | ORAL_TABLET | Freq: Once | ORAL | Status: AC
  Administered 2022-09-24: 25 mg via ORAL
  Filled 2022-09-24: qty 1

## 2022-09-24 MED ORDER — SODIUM CHLORIDE 0.9 % IV BOLUS
1000.0000 mL | Freq: Once | INTRAVENOUS | Status: AC
  Administered 2022-09-24: 1000 mL via INTRAVENOUS

## 2022-09-24 MED ORDER — KETOROLAC TROMETHAMINE 30 MG/ML IJ SOLN
30.0000 mg | Freq: Once | INTRAMUSCULAR | Status: AC
  Administered 2022-09-24: 30 mg via INTRAVENOUS
  Filled 2022-09-24: qty 1

## 2022-09-24 NOTE — ED Provider Notes (Signed)
[facility] [facility] Provider Note   CSN: [id number] Arrival date & time: 09/24/22  8315     History  Chief Complaint  Patient presents with   Abdominal Pain    Derrick Scott is a 47 y.o. male.  Pt is a 47 yo male with pmhx significant for htn, dm, hld, gerd, and kidney stones.  Pt said he has had llq pain which started 2 days ago.  He has not had any n/v.  No hematuria or dysuria.  He's been constipated, but had a bm today without improvement in sx.  Pt does have some pain in his left lower back as well.         Home Medications Prior to Admission medications   Medication Sig Start Date End Date Taking? Authorizing Provider  atorvastatin (LIPITOR) 40 MG tablet Take 1 tablet (40 mg total) by mouth daily. 12/20/21   Nche, Bonna Gains, NP  cetirizine (ZYRTEC) 10 MG tablet Take 1 tablet (10 mg total) by mouth daily. 03/24/22   Derwood Kaplan, MD  cyclobenzaprine (FLEXERIL) 10 MG tablet Take 1 tablet (10 mg total) by mouth 2 (two) times daily as needed for muscle spasms. 04/24/22   Tegeler, Canary Brim, MD  lidocaine (LIDODERM) 5 % Place 1 patch onto the skin daily. Remove & Discard patch within 12 hours or as directed by MD 04/24/22   Tegeler, Canary Brim, MD  losartan (COZAAR) 25 MG tablet Take 1 tablet (25 mg total) by mouth daily. 05/27/22   Nche, Bonna Gains, NP  meclizine (ANTIVERT) 25 MG tablet Take 1 tablet (25 mg total) by mouth 3 (three) times daily as needed for dizziness (do not use for more than 3days). 06/17/22   Nche, Bonna Gains, NP  meloxicam (MOBIC) 15 MG tablet Take 15 mg by mouth daily. 03/03/22   [provider]  metFORMIN (GLUCOPHAGE) 500 MG tablet Take 0.5 tablets (250 mg total) by mouth 2 (two) times daily with a meal. 03/30/22   Nche, Bonna Gains, NP  omeprazole (PRILOSEC) 40 MG capsule Take 1 capsule (40 mg total) by mouth 2 (two) times daily. 05/31/22   Unk Lightning, PA  tirzepatide [facility]) 5  MG/0.5ML Pen Inject 5 mg into the skin once a week. 07/19/22   Eulis Foster, FNP      Allergies    Shrimp [shellfish allergy]    Review of Systems   Review of Systems  Gastrointestinal:  Positive for abdominal pain.  All other systems reviewed and are negative.   Physical Exam Updated Vital Signs BP (!) 175/99 (BP Location: Left Arm)   Pulse 85   Temp 98.3 F (36.8 C) (Oral)   Resp 18   SpO2 100%  Physical Exam Vitals and nursing note reviewed.  Constitutional:      Appearance: He is well-developed. He is obese.  HENT:     Head: Normocephalic and atraumatic.     Mouth/Throat:     Mouth: Mucous membranes are moist.     Pharynx: Oropharynx is clear.  Eyes:     Extraocular Movements: Extraocular movements intact.     Pupils: Pupils are equal, round, and reactive to light.  Cardiovascular:     Rate and Rhythm: Normal rate and regular rhythm.     Heart sounds: Normal heart sounds.  Pulmonary:     Effort: Pulmonary effort is normal.     Breath sounds: Normal breath sounds.  Abdominal:     General: Abdomen is  flat. Bowel sounds are normal.     Palpations: Abdomen is soft.     Tenderness: There is abdominal tenderness in the left lower quadrant.  Skin:    General: Skin is warm.     Capillary Refill: Capillary refill takes less than 2 seconds.  Neurological:     General: No focal deficit present.     Mental Status: He is alert and oriented to person, place, and time.  Psychiatric:        Mood and Affect: Mood normal.        Behavior: Behavior normal.     ED Results / Procedures / Treatments   Labs (all labs ordered are listed, but only abnormal results are displayed) Labs Reviewed  COMPREHENSIVE METABOLIC PANEL - Abnormal; Notable for the following components:      Result Value   Glucose, Bld 113 (*)    Total Protein 8.2 (*)    All other components within normal limits  URINALYSIS, ROUTINE W REFLEX MICROSCOPIC - Abnormal; Notable for the following components:    Hgb urine dipstick TRACE (*)    All other components within normal limits  URINALYSIS, MICROSCOPIC (REFLEX) - Abnormal; Notable for the following components:   Bacteria, UA RARE (*)    All other components within normal limits  CBC WITH DIFFERENTIAL/PLATELET  LIPASE, BLOOD    EKG None  Radiology CT ABDOMEN PELVIS W CONTRAST  Result Date: 09/24/2022 CLINICAL DATA:  Left lower quadrant pain for 2 days. Constipation for 2 weeks. EXAM: CT ABDOMEN AND PELVIS WITH CONTRAST TECHNIQUE: Multidetector CT imaging of the abdomen and pelvis was performed using the standard protocol following bolus administration of intravenous contrast. RADIATION DOSE REDUCTION: This exam was performed according to the departmental dose-optimization program which includes automated exposure control, adjustment of the mA and/or kV according to patient size and/or use of iterative reconstruction technique. CONTRAST:  OMNIPAQUE IOHEXOL 300 MG/ML  SOLN COMPARISON:  Noncontrast CT on 01/28/2020 FINDINGS: Lower Chest: No acute findings. Hepatobiliary: No suspicious hepatic masses identified. Mild diffuse hepatic steatosis. Gallbladder is unremarkable. No evidence of biliary ductal dilatation. Pancreas:  No mass or inflammatory changes. Spleen: Within normal limits in size and appearance. Adrenals/Urinary Tract: 2.3 cm homogeneous right adrenal mass is again seen, consistent with benign adenoma (No followup imaging is recommended). No renal masses identified. No evidence of ureteral calculi or hydronephrosis. Stomach/Bowel: No evidence of obstruction, inflammatory process or abnormal fluid collections. Normal appendix visualized. Vascular/Lymphatic: No pathologically enlarged lymph nodes. No acute vascular findings. Reproductive:  No mass or other significant abnormality. Other:  None. Musculoskeletal:  No suspicious bone lesions identified. IMPRESSION: No acute findings. Mild hepatic steatosis. Electronically Signed   By: Danae Orleans M.D.   On: 09/24/2022 10:25    Procedures Procedures    Medications Ordered in ED Medications  sodium chloride 0.9 % bolus 1,000 mL (1,000 mLs Intravenous New Bag/Given 09/24/22 0850)  ketorolac (TORADOL) 30 MG/ML injection 30 mg (30 mg Intravenous Given 09/24/22 0856)  losartan (COZAAR) tablet 25 mg (25 mg Oral Given 09/24/22 0856)  iohexol (OMNIPAQUE) 300 MG/ML solution 100 mL (100 mLs Intravenous Contrast Given 09/24/22 0954)    ED Course/ Medical Decision Making/ A&P                                 Medical Decision Making Amount and/or Complexity of Data Reviewed Labs: ordered. Radiology: ordered.  Risk Prescription drug management.  This patient presents to the ED for concern of abd pain, this involves an extensive number of treatment options, and is a complaint that carries with it a high risk of complications and morbidity.  The differential diagnosis includes diverticulitis, kidney stone, uti, msk   Co morbidities that complicate the patient evaluation  htn, dm, hld, gerd, and kidney stones   Additional history obtained:  Additional history obtained from epic chart review  Lab Tests:  I Ordered, and personally interpreted labs.  The pertinent results include:  cbc nl, cmp nl, ua with trace hgb   Imaging Studies ordered:  I ordered imaging studies including ct abd/pelvis  I independently visualized and interpreted imaging which showed  No acute findings.    Mild hepatic steatosis.   I agree with the radiologist interpretation   Cardiac Monitoring:  The patient was maintained on a cardiac monitor.  I personally viewed and interpreted the cardiac monitored which showed an underlying rhythm of: nsr   Medicines ordered and prescription drug management:  I ordered medication including toradol  for pain  Reevaluation of the patient after these medicines showed that the patient improved I have reviewed the patients home medicines and have made  adjustments as needed   Test Considered:   ct    Problem List / ED Course:  HTN:  pt did not take his bp meds this am.  He is given his normal am dose of cozaar and bp has come down. Abd pain:  unclear etiology.  CT neg.  He did have some mild hematuria, so he could have passed a stone.  Pt is to f/u with urology. Hematuria:  pt has had this in the past and saw a urologist several years ago.  He did not have a cystoscopy.  Pt given number of urology to f/u.    Reevaluation:  After the interventions noted above, I reevaluated the patient and found that they have :improved   Social Determinants of Health:  Lives at home   Dispostion:  After consideration of the diagnostic results and the patients response to treatment, I feel that the patent would benefit from discharge with outpatient f/u.  Return if worse.          Final Clinical Impression(s) / ED Diagnoses Final diagnoses:  Left lower quadrant abdominal pain  Hematuria, unspecified type    Rx / DC Orders ED Discharge Orders     None         Jacalyn Lefevre, MD 09/24/22 1047

## 2022-09-24 NOTE — ED Triage Notes (Signed)
Pt reports LLQ abd pain that began 2 days ago. Complains of constipation over the past two weeks and taking metamucil daily.  Also states when taking a deep there is discomfort in upper back.    Currently taking Mourjarno

## 2022-09-25 ENCOUNTER — Encounter: Payer: Self-pay | Admitting: Nurse Practitioner

## 2022-09-27 ENCOUNTER — Other Ambulatory Visit: Payer: BC Managed Care – PPO

## 2022-09-27 DIAGNOSIS — D3501 Benign neoplasm of right adrenal gland: Secondary | ICD-10-CM

## 2022-09-28 ENCOUNTER — Encounter (HOSPITAL_BASED_OUTPATIENT_CLINIC_OR_DEPARTMENT_OTHER): Payer: Self-pay

## 2022-09-28 ENCOUNTER — Emergency Department (HOSPITAL_BASED_OUTPATIENT_CLINIC_OR_DEPARTMENT_OTHER)
Admission: EM | Admit: 2022-09-28 | Discharge: 2022-09-29 | Disposition: A | Discharge status: Home / Self Care | Payer: BC Managed Care – PPO | Attending: Emergency Medicine | Admitting: Emergency Medicine

## 2022-09-28 ENCOUNTER — Other Ambulatory Visit: Payer: Self-pay

## 2022-09-28 DIAGNOSIS — R1030 Lower abdominal pain, unspecified: Secondary | ICD-10-CM | POA: Diagnosis not present

## 2022-09-28 DIAGNOSIS — R102 Pelvic and perineal pain: Secondary | ICD-10-CM | POA: Diagnosis not present

## 2022-09-28 LAB — CBC
HCT: 43.5 % (ref 39.0–52.0)
Hemoglobin: 14.8 g/dL (ref 13.0–17.0)
MCH: 28.7 pg (ref 26.0–34.0)
MCHC: 34 g/dL (ref 30.0–36.0)
MCV: 84.5 fL (ref 80.0–100.0)
Platelets: 302 10*3/uL (ref 150–400)
RBC: 5.15 MIL/uL (ref 4.22–5.81)
RDW: 13.3 % (ref 11.5–15.5)
WBC: 8.6 10*3/uL (ref 4.0–10.5)
nRBC: 0 % (ref 0.0–0.2)

## 2022-09-28 LAB — COMPREHENSIVE METABOLIC PANEL
ALT: 35 U/L (ref 0–44)
AST: 27 U/L (ref 15–41)
Albumin: 4.1 g/dL (ref 3.5–5.0)
Alkaline Phosphatase: 71 U/L (ref 38–126)
Anion gap: 10 (ref 5–15)
BUN: 15 mg/dL (ref 6–20)
CO2: 23 mmol/L (ref 22–32)
Calcium: 8.9 mg/dL (ref 8.9–10.3)
Chloride: 103 mmol/L (ref 98–111)
Creatinine, Ser: 1.19 mg/dL (ref 0.61–1.24)
GFR, Estimated: >60 mL/min (ref >60)
Glucose, Bld: 121 mg/dL — ABNORMAL HIGH (ref 70–99)
Potassium: 3.8 mmol/L (ref 3.5–5.1)
Sodium: 136 mmol/L (ref 135–145)
Total Bilirubin: 0.7 mg/dL (ref 0.3–1.2)
Total Protein: 8.1 g/dL (ref 6.5–8.1)

## 2022-09-28 LAB — LIPASE, BLOOD: Lipase: 42 U/L (ref 11–51)

## 2022-09-28 MED ORDER — KETOROLAC TROMETHAMINE 60 MG/2ML IM SOLN
30.0000 mg | Freq: Once | INTRAMUSCULAR | Status: AC
  Administered 2022-09-28: 30 mg via INTRAMUSCULAR
  Filled 2022-09-28: qty 2

## 2022-09-28 MED ORDER — OXYCODONE-ACETAMINOPHEN 5-325 MG PO TABS
1.0000 | ORAL_TABLET | Freq: Once | ORAL | Status: AC
  Administered 2022-09-28: 1 via ORAL
  Filled 2022-09-28: qty 1

## 2022-09-28 NOTE — ED Notes (Signed)
Patient ambulated to the bathroom.

## 2022-09-28 NOTE — ED Triage Notes (Signed)
Pt states that he has been having lower abd pain since Sat that continues, pt denies n/v/d, denies dysuria

## 2022-09-29 DIAGNOSIS — G4733 Obstructive sleep apnea (adult) (pediatric): Secondary | ICD-10-CM | POA: Diagnosis not present

## 2022-09-29 LAB — URINALYSIS, ROUTINE W REFLEX MICROSCOPIC
Bilirubin Urine: NEGATIVE
Glucose, UA: NEGATIVE mg/dL
Hgb urine dipstick: NEGATIVE
Ketones, ur: NEGATIVE mg/dL
Leukocytes,Ua: NEGATIVE
Nitrite: NEGATIVE
Protein, ur: NEGATIVE mg/dL
Specific Gravity, Urine: >=1.03 (ref 1.005–1.030)
pH: 5.5 (ref 5.0–8.0)

## 2022-09-29 MED ORDER — MELOXICAM 15 MG PO TABS: 15.0000 mg | ORAL_TABLET | Freq: Every day | ORAL | 0 refills | Status: DC

## 2022-09-29 MED ORDER — METHOCARBAMOL 500 MG PO TABS: 500.0000 mg | ORAL_TABLET | Freq: Three times a day (TID) | ORAL | 0 refills | Status: DC | PRN

## 2022-09-29 NOTE — ED Provider Notes (Signed)
[place] Provider Note   CSN: [id number] Arrival date & time: 09/28/22  2244     History  Chief Complaint  Patient presents with   Abdominal Pain    Derrick Scott is a 47 y.o. male.  47 year old male who presents ER today with lower abdominal pain.  Patient was here a few days ago for similar symptoms had labs and CT scan unremarkable.  Patient states this pain has persisted and wonders if he could have a urine infection.  He had Smallman hematuria on previous visit has not noticed any gross hematuria.  Denies any fever, nausea, vomiting, constipation, testicular pain, rashes, bulging or worsening with bowel movements or standing.  No trauma.  Wonders if his belt buckle could be causing an issue.  No rashes.   Abdominal Pain      Home Medications Prior to Admission medications   Medication Sig Start Date End Date Taking? Authorizing Provider  meloxicam (MOBIC) 15 MG tablet Take 1 tablet (15 mg total) by mouth daily. 09/29/22  Yes Daren Doswell, Barbara Cower, MD  methocarbamol (ROBAXIN) 500 MG tablet Take 1 tablet (500 mg total) by mouth every 8 (eight) hours as needed for muscle spasms. 09/29/22  Yes Branon Sabine, Barbara Cower, MD  atorvastatin (LIPITOR) 40 MG tablet Take 1 tablet (40 mg total) by mouth daily. 12/20/21   Nche, Bonna Gains, NP  cetirizine (ZYRTEC) 10 MG tablet Take 1 tablet (10 mg total) by mouth daily. 03/24/22   Derwood Kaplan, MD  cyclobenzaprine (FLEXERIL) 10 MG tablet Take 1 tablet (10 mg total) by mouth 2 (two) times daily as needed for muscle spasms. 04/24/22   Tegeler, Canary Brim, MD  lidocaine (LIDODERM) 5 % Place 1 patch onto the skin daily. Remove & Discard patch within 12 hours or as directed by MD 04/24/22   Tegeler, Canary Brim, MD  losartan (COZAAR) 25 MG tablet Take 1 tablet (25 mg total) by mouth daily. 05/27/22   Nche, Bonna Gains, NP  meclizine (ANTIVERT) 25 MG tablet Take 1 tablet (25 mg total) by mouth 3 (three)  times daily as needed for dizziness (do not use for more than 3days). 06/17/22   Nche, Bonna Gains, NP  metFORMIN (GLUCOPHAGE) 500 MG tablet Take 0.5 tablets (250 mg total) by mouth 2 (two) times daily with a meal. 03/30/22   Nche, Bonna Gains, NP  omeprazole (PRILOSEC) 40 MG capsule Take 1 capsule (40 mg total) by mouth 2 (two) times daily. 05/31/22   Unk Lightning, PA  tirzepatide [facility]) 5 MG/0.5ML Pen Inject 5 mg into the skin once a week. 07/19/22   Eulis Foster, FNP      Allergies    Shrimp [shellfish allergy]    Review of Systems   Review of Systems  Gastrointestinal:  Positive for abdominal pain.    Physical Exam Updated Vital Signs BP 117/78   Pulse 83   Temp 97.7 F (36.5 C)   Resp 17   SpO2 97%  Physical Exam Vitals and nursing note reviewed.  Constitutional:      Appearance: He is well-developed.  HENT:     Head: Normocephalic and atraumatic.  Cardiovascular:     Rate and Rhythm: Normal rate.  Pulmonary:     Effort: Pulmonary effort is normal. No respiratory distress.  Abdominal:     General: There is no distension.     Tenderness: There is no abdominal tenderness.  Musculoskeletal:        General: Normal  range of motion.     Cervical back: Normal range of motion.  Skin:    Comments: Skin and around pubic symphysis is normal no evidence of abscess, folliculitis, hernia or cellulitis.  Neurological:     Mental Status: He is alert.     ED Results / Procedures / Treatments   Labs (all labs ordered are listed, but only abnormal results are displayed) Labs Reviewed  COMPREHENSIVE METABOLIC PANEL - Abnormal; Notable for the following components:      Result Value   Glucose, Bld 121 (*)    All other components within normal limits  LIPASE, BLOOD  CBC  URINALYSIS, ROUTINE W REFLEX MICROSCOPIC    EKG None  Radiology No results found.  Procedures Procedures    Medications Ordered in ED Medications  ketorolac (TORADOL) injection 30  mg (30 mg Intramuscular Given 09/28/22 2359)  oxyCODONE-acetaminophen (PERCOCET/ROXICET) 5-325 MG per tablet 1 tablet (1 tablet Oral Given 09/28/22 2359)    ED Course/ Medical Decision Making/ A&P                                 Medical Decision Making Amount and/or Complexity of Data Reviewed Labs: ordered.  Risk Prescription drug management.   Labs reassuring.  He has same pain he had a few days ago and his labs are almost exactly the same.  CT scan then was unremarkable.  I do not see any indication to repeat CT scan today as he does not have persistent pain, tenderness, other evidence of a surgical or emergent pathology. As it does have a positional component at times, will treat with NSAID and muscle relaxer to see if it helps and if not follow up with PCP in regards to recent hematuria and further workup as needed.    Final Clinical Impression(s) / ED Diagnoses Final diagnoses:  Pelvic pain    Rx / DC Orders ED Discharge Orders          Ordered    meloxicam (MOBIC) 15 MG tablet  Daily        09/29/22 0114    methocarbamol (ROBAXIN) 500 MG tablet  Every 8 hours PRN        09/29/22 0114              Jeraldine Primeau, Barbara Cower, MD 09/29/22 0117

## 2022-10-04 ENCOUNTER — Encounter: Payer: Self-pay | Admitting: Nurse Practitioner

## 2022-10-05 ENCOUNTER — Telehealth: Payer: Self-pay

## 2022-10-05 DIAGNOSIS — D3501 Benign neoplasm of right adrenal gland: Secondary | ICD-10-CM

## 2022-10-05 NOTE — Telephone Encounter (Signed)
Patient states he received results and would like to know what the next steps are.

## 2022-10-05 NOTE — Telephone Encounter (Signed)
Patient has been advised and schedule for labs tomorrow

## 2022-10-06 ENCOUNTER — Other Ambulatory Visit (INDEPENDENT_AMBULATORY_CARE_PROVIDER_SITE_OTHER): Payer: BC Managed Care – PPO

## 2022-10-06 DIAGNOSIS — D3501 Benign neoplasm of right adrenal gland: Secondary | ICD-10-CM | POA: Diagnosis not present

## 2022-10-07 ENCOUNTER — Ambulatory Visit (INDEPENDENT_AMBULATORY_CARE_PROVIDER_SITE_OTHER): Payer: BC Managed Care – PPO | Admitting: Nurse Practitioner

## 2022-10-07 ENCOUNTER — Encounter: Payer: Self-pay | Admitting: Nurse Practitioner

## 2022-10-07 ENCOUNTER — Other Ambulatory Visit (HOSPITAL_COMMUNITY): Payer: Self-pay

## 2022-10-07 VITALS — BP 120/80 | HR 79 | Temp 97.8°F | Wt 277.2 lb

## 2022-10-07 DIAGNOSIS — R1032 Left lower quadrant pain: Secondary | ICD-10-CM

## 2022-10-07 DIAGNOSIS — R42 Dizziness and giddiness: Secondary | ICD-10-CM | POA: Diagnosis not present

## 2022-10-07 DIAGNOSIS — E1165 Type 2 diabetes mellitus with hyperglycemia: Secondary | ICD-10-CM | POA: Diagnosis not present

## 2022-10-07 DIAGNOSIS — I10 Essential (primary) hypertension: Secondary | ICD-10-CM

## 2022-10-07 DIAGNOSIS — R1031 Right lower quadrant pain: Secondary | ICD-10-CM | POA: Diagnosis not present

## 2022-10-07 DIAGNOSIS — G43009 Migraine without aura, not intractable, without status migrainosus: Secondary | ICD-10-CM | POA: Insufficient documentation

## 2022-10-07 DIAGNOSIS — G4733 Obstructive sleep apnea (adult) (pediatric): Secondary | ICD-10-CM

## 2022-10-07 LAB — POCT GLYCOSYLATED HEMOGLOBIN (HGB A1C): Hemoglobin A1C: 6.1 % — AB (ref 4.0–5.6)

## 2022-10-07 MED ORDER — RIZATRIPTAN BENZOATE 5 MG PO TABS
5.0000 mg | ORAL_TABLET | ORAL | 0 refills | Status: AC | PRN
Start: 2022-10-07 — End: ?

## 2022-10-07 NOTE — Assessment & Plan Note (Signed)
BP at goal today. He reports elevated BP at home. Use of wrist cuff. Does not have readings for review. Reports compliance with losartan 25mg  BP Readings from Last 3 Encounters:  10/07/22 120/80  09/29/22 118/76  09/24/22 (!) 189/93    Advised to use XL upper arm cuff to monitor BP.  He is to send BP readings via mychart. Maintain current med doses F/up in 3-60months

## 2022-10-07 NOTE — Assessment & Plan Note (Signed)
Reports insomnia and daily headache-throbbing, global, use of excedrin with minimal improvement. Sleeps about 5hrs with use or melatonin 10mg  States he is compliant with CPAP machine Last appointment with sleep specialist 10/2021. Advised to schedule f/up appointment with neurologist

## 2022-10-07 NOTE — Assessment & Plan Note (Signed)
>>  ASSESSMENT AND PLAN FOR SLEEP APNEA WRITTEN ON 10/07/2022 11:53 AM BY NCHE, CHARLOTTE LUM, NP  Reports insomnia and daily headache-throbbing, global, use of excedrin with minimal improvement. Sleeps about 5hrs with use or melatonin 10mg  States he is compliant with CPAP machine Last appointment with sleep specialist 10/2021. Advised to schedule f/up appointment with neurologist

## 2022-10-07 NOTE — Progress Notes (Signed)
Established Patient Visit  Patient: Derrick Scott   DOB: 22-Nov-1975   47 y.o. Male  MRN: [id number] Visit Date: 10/07/2022  Subjective:    Chief Complaint  Patient presents with   Medical Management of Chronic Issues    High B/p concerns. Lower abdominal pain went to ED 09/11. Discuss fmla for veritgo and migraines.    HPI Hypertension BP at goal today. He reports elevated BP at home. Use of wrist cuff. Does not have readings for review. Reports compliance with losartan 25mg  BP Readings from Last 3 Encounters:  10/07/22 120/80  09/29/22 118/76  09/24/22 (!) 189/93    Advised to use XL upper arm cuff to monitor BP.  He is to send BP readings via mychart. Maintain current med doses F/up in 3-43months  Sleep apnea Reports insomnia and daily headache-throbbing, global, use of excedrin with minimal improvement. Sleeps about 5hrs with use or melatonin 10mg  States he is compliant with CPAP machine Last appointment with sleep specialist 10/2021. Advised to schedule f/up appointment with neurologist   Vertigo He did not completed neuro rehab as previously discussed. Advised to schedule appointment with neuro rehab and neurology  Migraine without aura and without status migrainosus, not intractable Onset in middle school. Has headache daily, but migraine 1-2x/month Describes as global throbbing, associated with blurry vision, noise and light sensitivity Not associated with vertigo, no confusion, no LOC, No hx of seizure. No aura Use of excedrin migraine, cold compress, rest with some relief MRI brain completed 2023: Mildly motion degraded exam. No evidence of acute intracranial abnormality on this non-contrast routine protocol brain MRI. Mild multifocal T2 FLAIR hyperintense signal abnormality within the cerebral white matter, slightly progressed from the prior brain MRI of 11/02/2016. The signal changes are nonspecific, but likely reflect chronic small vessel  ischemic disease given the patient's history of diabetes and high cholesterol.  Sent maxalt 5mg  Advised to schedule appointment with neurology  completed intermittent FMLA form 2days per month x 6months Advised to discussed need for additional days with neurologist.   Bilateral groin pain Chronic, constant, some improvement with mobic. Declined to use muscles relaxant due to fear of daytime somnolence. He had CT ABDOMEN/pelvis 09/2022: no acute finding UA, CBC, CMP and lipase completed 09/2022: no acute finding. He had eval by physical Medicine&Rehab provider 01/2022: did not get hip x-ray, nor complete outpatient PT as recommended.  Continue use of mobic Advised to schedule f/up appointment with Dr. Rolly Pancake    Reviewed medical, surgical, and social history today  Medications: Outpatient Medications Prior to Visit  Medication Sig   atorvastatin (LIPITOR) 40 MG tablet Take 1 tablet (40 mg total) by mouth daily.   cetirizine (ZYRTEC) 10 MG tablet Take 1 tablet (10 mg total) by mouth daily.   cyclobenzaprine (FLEXERIL) 10 MG tablet Take 1 tablet (10 mg total) by mouth 2 (two) times daily as needed for muscle spasms.   losartan (COZAAR) 25 MG tablet Take 1 tablet (25 mg total) by mouth daily.   meclizine (ANTIVERT) 25 MG tablet Take 1 tablet (25 mg total) by mouth 3 (three) times daily as needed for dizziness (do not use for more than 3days).   meloxicam (MOBIC) 15 MG tablet Take 1 tablet (15 mg total) by mouth daily.   metFORMIN (GLUCOPHAGE) 500 MG tablet Take 0.5 tablets (250 mg total) by mouth 2 (two) times daily with a meal.   omeprazole (PRILOSEC)  40 MG capsule Take 1 capsule (40 mg total) by mouth 2 (two) times daily.   tirzepatide [facility]) 5 MG/0.5ML Pen Inject 5 mg into the skin once a week.   [DISCONTINUED] lidocaine (LIDODERM) 5 % Place 1 patch onto the skin daily. Remove & Discard patch within 12 hours or as directed by MD (Patient not taking: Reported on 10/07/2022)    [DISCONTINUED] methocarbamol (ROBAXIN) 500 MG tablet Take 1 tablet (500 mg total) by mouth every 8 (eight) hours as needed for muscle spasms. (Patient not taking: Reported on 10/07/2022)   No facility-administered medications prior to visit.   Reviewed past medical and social history.   ROS per HPI above  Last CBC Lab Results  Component Value Date   WBC 8.6 09/28/2022   HGB 14.8 09/28/2022   HCT 43.5 09/28/2022   MCV 84.5 09/28/2022   MCH 28.7 09/28/2022   RDW 13.3 09/28/2022   PLT 302 09/28/2022   Last metabolic panel Lab Results  Component Value Date   GLUCOSE 121 (H) 09/28/2022   NA 136 09/28/2022   K 3.8 09/28/2022   CL 103 09/28/2022   CO2 23 09/28/2022   BUN 15 09/28/2022   CREATININE 1.19 09/28/2022   GFRNONAA >60 09/28/2022   CALCIUM 8.9 09/28/2022   PROT 8.1 09/28/2022   ALBUMIN 4.1 09/28/2022   LABGLOB 3.5 05/18/2016   AGRATIO 1.2 05/18/2016   BILITOT 0.7 09/28/2022   ALKPHOS 71 09/28/2022   AST 27 09/28/2022   ALT 35 09/28/2022   ANIONGAP 10 09/28/2022   Last hemoglobin A1c Lab Results  Component Value Date   HGBA1C 6.1 (A) 10/07/2022        Objective:  BP 120/80   Pulse 79   Temp 97.8 F (36.6 C) (Temporal)   Wt 277 lb 3.2 oz (125.7 kg)   SpO2 100%   BMI 42.15 kg/m      Physical Exam Cardiovascular:     Rate and Rhythm: Normal rate.     Pulses: Normal pulses.  Pulmonary:     Effort: Pulmonary effort is normal.  Neurological:     Mental Status: He is alert and oriented to person, place, and time.     Cranial Nerves: No cranial nerve deficit.  Psychiatric:        Mood and Affect: Mood normal.        Behavior: Behavior normal.        Thought Content: Thought content normal.     Results for orders placed or performed in visit on 10/07/22  POCT glycosylated hemoglobin (Hb A1C)  Result Value Ref Range   Hemoglobin A1C 6.1 (A) 4.0 - 5.6 %   HbA1c POC (<> result, manual entry)     HbA1c, POC (prediabetic range)     HbA1c, POC  (controlled diabetic range)        Assessment & Plan:    Problem List Items Addressed This Visit     Bilateral groin pain    Chronic, constant, some improvement with mobic. Declined to use muscles relaxant due to fear of daytime somnolence. He had CT ABDOMEN/pelvis 09/2022: no acute finding UA, CBC, CMP and lipase completed 09/2022: no acute finding. He had eval by physical Medicine&Rehab provider 01/2022: did not get hip x-ray, nor complete outpatient PT as recommended.  Continue use of mobic Advised to schedule f/up appointment with Dr. Rolly Pancake      DM (diabetes mellitus) [facility])   Relevant Orders   POCT glycosylated hemoglobin (Hb A1C) (Completed)  Hypertension - Primary    BP at goal today. He reports elevated BP at home. Use of wrist cuff. Does not have readings for review. Reports compliance with losartan 25mg  BP Readings from Last 3 Encounters:  10/07/22 120/80  09/29/22 118/76  09/24/22 (!) 189/93    Advised to use XL upper arm cuff to monitor BP.  He is to send BP readings via mychart. Maintain current med doses F/up in 3-3months      Migraine without aura and without status migrainosus, not intractable    Onset in middle school. Has headache daily, but migraine 1-2x/month Describes as global throbbing, associated with blurry vision, noise and light sensitivity Not associated with vertigo, no confusion, no LOC, No hx of seizure. No aura Use of excedrin migraine, cold compress, rest with some relief MRI brain completed 2023: Mildly motion degraded exam. No evidence of acute intracranial abnormality on this non-contrast routine protocol brain MRI. Mild multifocal T2 FLAIR hyperintense signal abnormality within the cerebral white matter, slightly progressed from the prior brain MRI of 11/02/2016. The signal changes are nonspecific, but likely reflect chronic small vessel ischemic disease given the patient's history of diabetes and high cholesterol.  Sent maxalt  5mg  Advised to schedule appointment with neurology  completed intermittent FMLA form 2days per month x 6months Advised to discussed need for additional days with neurologist.       Relevant Medications   rizatriptan (MAXALT) 5 MG tablet   Sleep apnea    Reports insomnia and daily headache-throbbing, global, use of excedrin with minimal improvement. Sleeps about 5hrs with use or melatonin 10mg  States he is compliant with CPAP machine Last appointment with sleep specialist 10/2021. Advised to schedule f/up appointment with neurologist       Vertigo    He did not completed neuro rehab as previously discussed. Advised to schedule appointment with neuro rehab and neurology      Return in about 6 months (around 04/06/2023) for HTN, DM, hyperlipidemia (fasting).     Alysia Penna, NP

## 2022-10-07 NOTE — Assessment & Plan Note (Addendum)
Onset in middle school. Has headache daily, but migraine 1-2x/month Describes as global throbbing, associated with blurry vision, noise and light sensitivity Not associated with vertigo, no confusion, no LOC, No hx of seizure. No aura Use of excedrin migraine, cold compress, rest with some relief MRI brain completed 2023: Mildly motion degraded exam. No evidence of acute intracranial abnormality on this non-contrast routine protocol brain MRI. Mild multifocal T2 FLAIR hyperintense signal abnormality within the cerebral white matter, slightly progressed from the prior brain MRI of 11/02/2016. The signal changes are nonspecific, but likely reflect chronic small vessel ischemic disease given the patient's history of diabetes and high cholesterol.  Sent maxalt 5mg  Advised to schedule appointment with neurology  completed intermittent FMLA form 2days per month x 6months Advised to discussed need for additional days with neurologist.

## 2022-10-07 NOTE — Assessment & Plan Note (Addendum)
Chronic, constant, some improvement with mobic. Declined to use muscles relaxant due to fear of daytime somnolence. He had CT ABDOMEN/pelvis 09/2022: no acute finding UA, CBC, CMP and lipase completed 09/2022: no acute finding. He had eval by physical Medicine&Rehab provider 01/2022: did not get hip x-ray, nor complete outpatient PT as recommended.  Continue use of mobic Advised to schedule f/up appointment with Dr. Rolly Pancake

## 2022-10-07 NOTE — Assessment & Plan Note (Signed)
He did not completed neuro rehab as previously discussed. Advised to schedule appointment with neuro rehab and neurology

## 2022-10-07 NOTE — Patient Instructions (Signed)
Use maxalt for migraine headache Schedule appointment with neurology and orthopedist Your FMLA form will be completed for migraine and vertigo: 2days per month x 6months.

## 2022-10-11 ENCOUNTER — Encounter: Payer: Self-pay | Admitting: Nurse Practitioner

## 2022-10-11 DIAGNOSIS — E1165 Type 2 diabetes mellitus with hyperglycemia: Secondary | ICD-10-CM

## 2022-10-11 LAB — CORTISOL, URINE, 24 HOUR
24 Hour urine volume (VMAHVA): 2000 mL
CREATININE, URINE: 2.68 g/(24.h) — ABNORMAL HIGH (ref 0.50–2.15)
Cortisol (Ur), Free: 31.1 mcg/24 h (ref 4.0–50.0)

## 2022-10-11 LAB — CATECHOLAMINES, FRACTIONATED, URINE, 24 HOUR
Calc Total (E+NE): 479 ug/(24.h) — ABNORMAL HIGH (ref 26–121)
Creatinine, Urine mg/day-CATEUR: 2.8 g/(24.h) — ABNORMAL HIGH (ref 0.50–2.15)
Dopamine 24 Hr Urine: 299 ug/(24.h) (ref 52–480)
Norepinephrine, 24H, Ur: 479 ug/(24.h) — ABNORMAL HIGH (ref 15–100)
Total Volume: 2000 mL

## 2022-10-12 ENCOUNTER — Telehealth: Payer: Self-pay | Admitting: Internal Medicine

## 2022-10-12 NOTE — Telephone Encounter (Signed)
The patient was supposed to come back for repeat blood work on 9/19 but I see that the order that was placed was canceled, can you please check why this was canceled?  The patient does need this test, it appears that Noreene Larsson has canceled it or at least her name is on the order    Thanks

## 2022-10-12 NOTE — Telephone Encounter (Signed)
Message has been sent to lab leaders in teams chat that was provided to look into the cancellation

## 2022-10-12 NOTE — Telephone Encounter (Signed)
Per Aram Beecham test has not been canceled and should have result by end of week. They are not sure why it shows canceled on our end.

## 2022-10-13 ENCOUNTER — Other Ambulatory Visit: Payer: Self-pay

## 2022-10-13 ENCOUNTER — Other Ambulatory Visit: Payer: Self-pay | Admitting: Emergency Medicine

## 2022-10-13 DIAGNOSIS — D3501 Benign neoplasm of right adrenal gland: Secondary | ICD-10-CM

## 2022-10-18 ENCOUNTER — Ambulatory Visit (INDEPENDENT_AMBULATORY_CARE_PROVIDER_SITE_OTHER): Payer: BC Managed Care – PPO | Admitting: Adult Health

## 2022-10-18 ENCOUNTER — Other Ambulatory Visit (HOSPITAL_COMMUNITY): Payer: Self-pay

## 2022-10-18 ENCOUNTER — Encounter: Payer: Self-pay | Admitting: Adult Health

## 2022-10-18 VITALS — BP 126/84 | HR 95 | Ht 68.0 in | Wt 282.0 lb

## 2022-10-18 DIAGNOSIS — G4733 Obstructive sleep apnea (adult) (pediatric): Secondary | ICD-10-CM

## 2022-10-19 ENCOUNTER — Encounter: Payer: Self-pay | Admitting: Nurse Practitioner

## 2022-10-24 ENCOUNTER — Ambulatory Visit: Payer: BC Managed Care – PPO | Attending: Nurse Practitioner

## 2022-10-24 DIAGNOSIS — R42 Dizziness and giddiness: Secondary | ICD-10-CM | POA: Insufficient documentation

## 2022-10-24 DIAGNOSIS — R2681 Unsteadiness on feet: Secondary | ICD-10-CM | POA: Diagnosis not present

## 2022-10-24 NOTE — Therapy (Signed)
OUTPATIENT PHYSICAL THERAPY VESTIBULAR EVALUATION     Patient Name: Derrick Scott MRN: [id number] DOB:[birth year], 47 y.o., male Today's Date: 10/24/2022  END OF SESSION:  PT End of Session - 10/24/22 1310     Visit Number 1    Number of Visits 13    Date for PT Re-Evaluation 01/20/23    Authorization Type BCBS    PT Start Time 1318    PT Stop Time 1400    PT Time Calculation (min) 42 min    Activity Tolerance Patient tolerated treatment well    Behavior During Therapy WFL for tasks assessed/performed             Past Medical History:  Diagnosis Date   Arthritis    knee bilateral   Asthma    as a child "NO attacks"   Diabetes mellitus without complication (HCC)    border line no medicationa   GERD (gastroesophageal reflux disease)    High cholesterol    Medial meniscus tear 01/2012   right knee   Obesity    Pneumonia    Renal disorder    hematuria,kidney stones   Sleep apnea    Vertigo, benign paroxysmal 2015   Past Surgical History:  Procedure Laterality Date   CORNEAL TRANSPLANT Left 12/17/2009   KNEE ARTHROSCOPY  02/20/2012   Procedure: ARTHROSCOPY KNEE;  Surgeon: Harvie Junior, MD;  Location:  [facility];  Service: Orthopedics;  Laterality: Right;  Chondroplasia patella femoral joint, medial meniscal debriedment   Patient Active Problem List   Diagnosis Date Noted   Migraine without aura and without status migrainosus, not intractable 10/07/2022   Adrenal adenoma, right 09/13/2022   Hyperlipidemia associated with type 2 diabetes mellitus (HCC) 03/30/2022   Strain of iliopsoas muscle, right, initial encounter 02/08/2022   Hamstring tightness of right lower extremity 02/08/2022   Hypertension 10/28/2021   OSA on CPAP 10/27/2021   Obesity, morbid (HCC) 10/27/2021   Family history of breast cancer in first degree relative 09/21/2021   Family history of prostate cancer in father 09/21/2021   Rhinitis 03/04/2021   Chronic left  shoulder pain 12/16/2020   Screening for malignant neoplasm of prostate 12/16/2020   Chronic midline low back pain without sciatica 12/16/2020   Bilateral groin pain 04/29/2020   DM (diabetes mellitus) (HCC) 12/25/2019   Internal hemorrhoids without complication 02/12/2019   Other tear of medial meniscus, current injury, left knee, subsequent encounter 05/04/2017   Pes planus 02/07/2017   Laryngopharyngeal reflux (LPR) 07/24/2015   Post corneal transplant 07/25/2014   Vertigo 08/30/2013   Adrenal mass, right (HCC) 05/22/2013   Sleep apnea 07/31/2009    PCP: Anne Ng, NP REFERRING PROVIDER: Anne Ng, NP  REFERRING DIAG: R42 (ICD-10-CM) - Vertigo   THERAPY DIAG:  Unsteadiness on feet - Plan: PT plan of care cert/re-cert  Dizziness and giddiness - Plan: PT plan of care cert/re-cert  ONSET DATE:   06/17/2022  referral  Rationale for Evaluation and Treatment: Rehabilitation  SUBJECTIVE:   SUBJECTIVE STATEMENT: Patient arrives to clinic alone, no AD. States he has "episodes" where he's dizzy and then will do "the repositioning maneuvers" and it will go away. Recently had a 24 hour urine sample and found that he had elevated norepinephrine and a small non-cancerous tumor on his adrenal gland and through a google search found that those levels can cause dizziness and HA. Here asking for intermittent FMLA or "as needed." He drives a truck for [employer] and endorses  multiple episodes of dizziness where he needs to pull off on the road or has fallen at a customers house. Dizzy spells started in 2017, lasted for 2 days, no issues and then 5-6 years later came back.  Pt accompanied by: self  PERTINENT HISTORY: arthritis, asthma, DM, GERD, OSA, HLD, CKD  PAIN:  Are you having pain? No  PRECAUTIONS: Fall   WEIGHT BEARING RESTRICTIONS: No  FALLS: Has patient fallen in last 6 months? No  PATIENT GOALS: "to get rid of the vertigo"  OBJECTIVE:  Note: Objective  measures were completed at Evaluation unless otherwise noted.  DIAGNOSTIC FINDINGS: non contributory   COGNITION: Overall cognitive status: Within functional limits for tasks assessed   Cervical ROM:   WFL  STRENGTH: WFL  BED MOBILITY:  independent  GAIT: Gait pattern: WFL   VESTIBULAR ASSESSMENT:  GENERAL OBSERVATION: NAD   SYMPTOM BEHAVIOR:  Subjective history: see above  Non-Vestibular symptoms: nausea/vomiting and migraine symptoms  Type of dizziness: Spinning/Vertigo carousel (not ferris wheel)  Frequency: 4 "spells" in past month  Duration: last 2-4 days each (spinning lasts seconds, but occurs in clusters that last 2-4 days)   Aggravating factors:  very inconsistent   Relieving factors: medication and trying to do exercises previously provided  Progression of symptoms: unchanged  OCULOMOTOR EXAM:  Ocular Alignment: normal  Ocular ROM: No Limitations  Spontaneous Nystagmus: absent  Gaze-Induced Nystagmus: absent  Smooth Pursuits: intact  Saccades: intact  VESTIBULAR - OCULAR REFLEX:   Slow VOR: Normal  VOR Cancellation: Normal  Head-Impulse Test: HIT Right: negative HIT Left: negative   POSITIONAL TESTING: Right Dix-Hallpike: no nystagmus Left Dix-Hallpike: no nystagmus Right Roll Test: no nystagmus Left Roll Test: no nystagmus  MOTION SENSITIVITY:  Motion Sensitivity Quotient Intensity: 0 = none, 1 = Lightheaded, 2 = Mild, 3 = Moderate, 4 = Severe, 5 = Vomiting  Intensity  1. Sitting to supine 0  2. Supine to L side 0  3. Supine to R side 0  4. Supine to sitting 0  5. L Hallpike-Dix 0  6. Up from L  0  7. R Hallpike-Dix 0  8. Up from R  0  9. Sitting, head tipped to L knee   10. Head up from L knee   11. Sitting, head tipped to R knee   12. Head up from R knee   13. Sitting head turns x5   14.Sitting head nods x5   15. In stance, 180 turn to L    16. In stance, 180 turn to R      VESTIBULAR TREATMENT:                                                                                                    N/a eval  PATIENT EDUCATION: Education details: PT POC, exam findings, vestibular migraine handout to figure out triggers of dizzy episodes Person educated: Patient Education method: Explanation and Handouts Education comprehension: verbalized understanding and needs further education  HOME EXERCISE PROGRAM: To be provided PRN GOALS: Goals reviewed with patient? Yes  SHORT TERM GOALS: Target date: 12/09/22  Pt will be independent with initial HEP for improved symptoms  Baseline: to be provided PRN Goal status: INITIAL  2.  Patient will demonstrate (-) positional testing to indicate resolution of BPPV  Baseline: to be reassessed PRN Goal status: INITIAL    LONG TERM GOALS: Target date: 01/20/23  Pt will be independent with final HEP for improved symptoms  Baseline: to be provided PRN Goal status: INITIAL  2.  Patient will demonstrate (-) positional testing to indicate resolution of BPPV  Baseline: to be reassessed PRN Goal status: INITIAL   ASSESSMENT:  CLINICAL IMPRESSION: Patient is a 47 y.o. male who was seen today for physical therapy evaluation and treatment for intermittent dizziness. Patient with normal vestibular evaluation today, but endorses "spells" or episodes of dizziness that last 2-4 days with periods of spinning vertigo within those days. Sometimes accompanied by a HA, sometimes not. PT questioning vestibular migraines? Is still waiting on lab results from Endo, which may also explain intermittent dizziness. Patient does report that during those moments of spinning vertigo, his eyes are "moving." PT requesting that patient try to get a video of this to better dx or inform patient. He has been to this clinic previously for "vertigo," but vestibular exam was normal then as well. He would benefit from a trial of PT PRN when/if he experiences a dizzy episode within the next 3 months.    OBJECTIVE  IMPAIRMENTS: decreased knowledge of condition and dizziness.   ACTIVITY LIMITATIONS: locomotion level and caring for others  PARTICIPATION LIMITATIONS: interpersonal relationship, driving, shopping, community activity, and occupation  PERSONAL FACTORS: Profession, Time since onset of injury/illness/exacerbation, and 3+ comorbidities: see above  are also affecting patient's functional outcome.   REHAB POTENTIAL: Fair unknown etiology  CLINICAL DECISION MAKING: Evolving/moderate complexity  EVALUATION COMPLEXITY: Moderate   PLAN:  PT FREQUENCY: 1x/week  PT DURATION: 12 weeks  PLANNED INTERVENTIONS: Therapeutic exercises, Therapeutic activity, Neuromuscular re-education, Balance training, Gait training, Patient/Family education, Self Care, Joint mobilization, Stair training, Vestibular training, Canalith repositioning, Visual/preceptual remediation/compensation, DME instructions, Aquatic Therapy, Manual therapy, and Re-evaluation  PLAN FOR NEXT SESSION: reassess vestib PRN   Westley Foots, PT Westley Foots, PT, DPT, CBIS  10/24/2022, 2:18 PM

## 2022-10-25 LAB — CATECHOLAMINES, FRACTIONATED, PLASMA
Dopamine: 17 pg/mL
Epinephrine: 13 pg/mL
Norepinephrine: 1109 pg/mL — ABNORMAL HIGH
Total Catecholamines: 1139 pg/mL — ABNORMAL HIGH

## 2022-10-26 ENCOUNTER — Telehealth: Payer: Self-pay

## 2022-10-26 ENCOUNTER — Telehealth: Payer: Self-pay | Admitting: Internal Medicine

## 2022-10-26 DIAGNOSIS — D3501 Benign neoplasm of right adrenal gland: Secondary | ICD-10-CM

## 2022-10-26 NOTE — Telephone Encounter (Signed)
Patient calling to go over lab results and wants to know if results could be the cause of the migraines. I

## 2022-10-26 NOTE — Telephone Encounter (Signed)
Patient wanting to speak to clinical staff about his test results he is concerned about his headaches . Please message patient on [facility]

## 2022-10-26 NOTE — Telephone Encounter (Signed)
Spoke to Dr. Charlean Sanfilippo 10/26/2022    He has chronic headaches and episodes of dizziness requiring him to pull off the road.   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 Lovie Macadamia    Pt will start phenoxybenzamine 1-2 weeks prior to sx    Latest Reference Range & Units 10/06/22 08:11  Epinephrine pg/mL 13  Norepinephrine pg/mL 1,109 (H)  Dopamine pg/mL 17     Latest Reference Range & Units 09/27/22 10:30  Dopamine 24 Hr Urine 52 - 480 mcg/24 h 299  Epinephrine, 24H, Ur mcg/24 h see note  Norepinephrine, 24H, Ur 15 - 100 mcg/24 h 479 (H)  Total Volume mL 2,000  Creatinine, Urine 0.50 - 2.15 g/24 h 2.68 (H)  Creatinine, Urine mg/day-CATEUR 0.50 - 2.15 g/24 h 2.80 (H)  (H): Data is abnormally high   Latest Reference Range & Units 09/27/22 10:30  Dopamine 24 Hr Urine 52 - 480 mcg/24 h 299  Epinephrine, 24H, Ur mcg/24 h see note  Norepinephrine, 24H, Ur 15 - 100 mcg/24 h 479 (H)  Total Volume mL 2,000  Creatinine, Urine 0.50 - 2.15 g/24 h 2.68 (H)  Creatinine, Urine mg/day-CATEUR 0.50 - 2.15 g/24 h 2.80 (H)  (H): Data is abnormally high

## 2022-10-27 DIAGNOSIS — G4733 Obstructive sleep apnea (adult) (pediatric): Secondary | ICD-10-CM | POA: Diagnosis not present

## 2022-10-27 NOTE — Telephone Encounter (Signed)
I spoke to him last night

## 2022-10-27 NOTE — Telephone Encounter (Signed)
Patient called again about result and his is concerned about the headaches

## 2022-10-29 DIAGNOSIS — G4733 Obstructive sleep apnea (adult) (pediatric): Secondary | ICD-10-CM | POA: Diagnosis not present

## 2022-11-02 ENCOUNTER — Ambulatory Visit: Payer: BC Managed Care – PPO | Admitting: Adult Health

## 2022-11-08 DIAGNOSIS — E279 Disorder of adrenal gland, unspecified: Secondary | ICD-10-CM | POA: Diagnosis not present

## 2022-11-09 DIAGNOSIS — N5201 Erectile dysfunction due to arterial insufficiency: Secondary | ICD-10-CM | POA: Diagnosis not present

## 2022-11-09 DIAGNOSIS — N401 Enlarged prostate with lower urinary tract symptoms: Secondary | ICD-10-CM | POA: Diagnosis not present

## 2022-11-09 DIAGNOSIS — R3914 Feeling of incomplete bladder emptying: Secondary | ICD-10-CM | POA: Diagnosis not present

## 2022-11-09 DIAGNOSIS — R3912 Poor urinary stream: Secondary | ICD-10-CM | POA: Diagnosis not present

## 2022-11-15 ENCOUNTER — Other Ambulatory Visit (HOSPITAL_COMMUNITY): Payer: Self-pay

## 2022-11-15 ENCOUNTER — Other Ambulatory Visit: Payer: Self-pay

## 2022-11-16 ENCOUNTER — Encounter: Payer: Self-pay | Admitting: Family Medicine

## 2022-11-16 ENCOUNTER — Telehealth: Payer: Self-pay | Admitting: Family Medicine

## 2022-11-16 ENCOUNTER — Ambulatory Visit: Payer: BC Managed Care – PPO | Admitting: Family Medicine

## 2022-11-16 ENCOUNTER — Telehealth: Payer: Self-pay | Admitting: Nurse Practitioner

## 2022-11-16 VITALS — BP 130/82 | HR 100 | Ht 68.0 in | Wt 286.2 lb

## 2022-11-16 DIAGNOSIS — I10 Essential (primary) hypertension: Secondary | ICD-10-CM

## 2022-11-16 DIAGNOSIS — E278 Other specified disorders of adrenal gland: Secondary | ICD-10-CM | POA: Diagnosis not present

## 2022-11-16 DIAGNOSIS — R42 Dizziness and giddiness: Secondary | ICD-10-CM

## 2022-11-16 DIAGNOSIS — Z7985 Long-term (current) use of injectable non-insulin antidiabetic drugs: Secondary | ICD-10-CM | POA: Diagnosis not present

## 2022-11-16 DIAGNOSIS — H8110 Benign paroxysmal vertigo, unspecified ear: Secondary | ICD-10-CM | POA: Diagnosis not present

## 2022-11-16 DIAGNOSIS — G4733 Obstructive sleep apnea (adult) (pediatric): Secondary | ICD-10-CM

## 2022-11-16 DIAGNOSIS — D3501 Benign neoplasm of right adrenal gland: Secondary | ICD-10-CM

## 2022-11-16 DIAGNOSIS — E1165 Type 2 diabetes mellitus with hyperglycemia: Secondary | ICD-10-CM

## 2022-11-16 MED ORDER — LOSARTAN POTASSIUM 100 MG PO TABS: 100.0000 mg | ORAL_TABLET | Freq: Every day | ORAL | 3 refills | Status: DC

## 2022-11-16 NOTE — Patient Instructions (Signed)
We increased your PP med and a new refill is sent in. Have your company send me FMLA paperwork and I will be happy to fill it out.  I have sent in a referral for ENT. Let me see you back in about 4-6 weeks.

## 2022-11-16 NOTE — Telephone Encounter (Signed)
Patient dropped off FMLA paperwork to be completed. Last DOS was 11/16/22. Placed in [employer].

## 2022-11-16 NOTE — Telephone Encounter (Deleted)
Reviewed patient's form Placed in PCP's box to be completed.  Drusilla Kanner, CMA

## 2022-11-16 NOTE — Telephone Encounter (Signed)
Reviewed patient's form Placed in PCP's box to be completed.  Drusilla Kanner, CMA

## 2022-11-17 ENCOUNTER — Encounter: Payer: Self-pay | Admitting: Family Medicine

## 2022-11-17 ENCOUNTER — Encounter: Payer: BC Managed Care – PPO | Attending: Nurse Practitioner | Admitting: Dietician

## 2022-11-17 DIAGNOSIS — E279 Disorder of adrenal gland, unspecified: Secondary | ICD-10-CM | POA: Diagnosis not present

## 2022-11-17 DIAGNOSIS — E1165 Type 2 diabetes mellitus with hyperglycemia: Secondary | ICD-10-CM | POA: Diagnosis not present

## 2022-11-17 NOTE — Progress Notes (Signed)
    CHIEF COMPLAINT / HPI: Here to reestablish care.  Main issue is continued problems with vertigo.  This has been going on about several months.  He will have 7 or 8 of these episodes during the month.  They can happen anytime.  A week or so ago he had an episode at work and fell over.  His boss ask him if he was drunk and he is concerned that having these episodes may cause issues with his job. 2.  He is currently being worked up at [facility] for an adrenal mass.  He has had the scan and some blood work but has not had follow-up appointment yet and all of his lab work is not back.  He wonders if this could be contributing to the his vertigo. 3.  He is also having continued chronic problems with headaches but they seem much worse in the last 2 months.   PERTINENT  PMH / PSH: I have reviewed the patient's medications, allergies, past medical and surgical history, smoking status and updated in the EMR as appropriate.   OBJECTIVE:  BP 130/82   Pulse 100   Ht 5\' 8"  (1.727 m)   Wt 286 lb 3.2 oz (129.8 kg)   SpO2 100%   BMI 43.52 kg/m  GENERAL: Well-developed no acute distress cv: Regular rate and rhythm MSK: Moving extremities x 4 with normal muscle bulk and tone. Neuro: Cannot Reproduce Any Vertigo Symptoms with Maneuvers Today.  No Nystagmus Is Noted.  He Can Stand Straight with Arms Outstretched.  Gait Is Normal.  Cranial Nerves II through XII Are Grossly Intact. PSYCH: AxOx4. Good eye contact.. No psychomotor retardation or agitation. Appropriate speech fluency and content. Asks and answers questions appropriately. Mood is congruent.   ASSESSMENT / PLAN:   Adrenal adenoma, right >>ASSESSMENT AND PLAN FOR ADRENAL MASS, RIGHT (HCC) WRITTEN ON 11/17/2022  5:49 PM BY Yong Grieser L, MD  Reviewed the workup at [facility].  I do not know for sure that his vertigo is unrelated to this but I suspect it is unrelated.  Vertigo He has had this issue at least for 18 months.  Sounds like it has  been more troublesome in the last few months.  Think we need to send him back to ENT for further evaluation as there was some question when he was initially seen about whether or not this could be Mnire's disease.  I also recommended he do the BP PV exercises daily whether he is experiencing symptoms or not.  Referral was placed.  I would like to see him few weeks after he completes the referral.  Hypertension Blood pressure is not well-controlled.  Will increase his medication regimen from 25 mg losartan to 100 mg losartan.  And follow-up 4 weeks.  Reviewed his recent lab work from [facility] and has normal potassium and normal kidney function.  Will recheck when I see him back.  DM (diabetes mellitus) (HCC) Type 2 diabetes mellitus without long-term use of insulin: He continues on metformin.  He had an A1c 2 months ago that was 6.1.  Will follow-up at next visit with repeat A1c.   Denny Levy MD

## 2022-11-17 NOTE — Assessment & Plan Note (Signed)
He has had this issue at least for 18 months.  Sounds like it has been more troublesome in the last few months.  Think we need to send him back to ENT for further evaluation as there was some question when he was initially seen about whether or not this could be Mnire's disease.  I also recommended he do the BP PV exercises daily whether he is experiencing symptoms or not.  Referral was placed.  I would like to see him few weeks after he completes the referral.

## 2022-11-17 NOTE — Progress Notes (Deleted)
NEUROLOGY FOLLOW UP OFFICE NOTE  Jonluc Sneath II [id number]  Assessment/Plan:   He is now describing symptoms that seem more aligned with vestibular migraine.  ***   Subjective:  Verl Vandervelde II is a 47 year old male with asthma, hyperglycemia, and high cholesterol who follows up for vertigo.  UPDATE: Last seen in February 2023.  MRI of brain with and without contrast on 03/18/2021 personally reviewed showed mild chronic small vessel ischemic changes but nothing more concerning.  ***   HISTORY: He began having positional vertigo in 2016  He had his first episode of vertigo in 2016.  He woke up one morning and the room was spinning.  Brought on by change in position or quick head turns.  Lasts 10 seconds but can be severe and accompanied by nausea.  Usually occurs over  3-4 days.  He had another episode in 2018.  He had two episodes in 2021.  In 2022, he had an episode in July and then October.  He had subsequent episodes in 2023 ***vestibular rehab last week but did not have any vertigo and the therapist was not able to elicit an attack.  He has a follow up next week.   He is concerned because he sometimes feels brief paroxysmal stabs in his head and occasionally notes tingling on the right side of his face.  His father-in-law previously had dizziness and headache and turned out to have had a stroke.  He denies family history of cerebral aneurysm.  He does have history of migraines.  They are well-controlled since using a CPAP for his OSA.  He had about 3 or 4 last year.  PAST MEDICAL HISTORY: Past Medical History:  Diagnosis Date   Arthritis    knee bilateral   Asthma    as a child "NO attacks"   Diabetes mellitus without complication (HCC)    border line no medicationa   GERD (gastroesophageal reflux disease)    High cholesterol    Medial meniscus tear 01/2012   right knee   Obesity    Pneumonia    Renal disorder    hematuria,kidney stones   Sleep apnea     Vertigo, benign paroxysmal 2015    MEDICATIONS: Current Outpatient Medications on File Prior to Visit  Medication Sig Dispense Refill   cetirizine (ZYRTEC) 10 MG tablet Take 1 tablet (10 mg total) by mouth daily. 10 tablet 0   losartan (COZAAR) 100 MG tablet Take 1 tablet (100 mg total) by mouth daily. 90 tablet 3   meclizine (ANTIVERT) 25 MG tablet Take 1 tablet (25 mg total) by mouth 3 (three) times daily as needed for dizziness (do not use for more than 3days). 21 tablet 0   meloxicam (MOBIC) 15 MG tablet Take 1 tablet (15 mg total) by mouth daily. 10 tablet 0   metFORMIN (GLUCOPHAGE) 500 MG tablet Take 0.5 tablets (250 mg total) by mouth 2 (two) times daily with a meal. 90 tablet 3   omeprazole (PRILOSEC) 40 MG capsule Take 1 capsule (40 mg total) by mouth 2 (two) times daily. 60 capsule 5   rizatriptan (MAXALT) 5 MG tablet Take 1-2 tablets (5-10 mg total) by mouth as needed for migraine. May repeat in 2 hours if needed. No more than 30mg  in 24hrs 10 tablet 0   tirzepatide (MOUNJARO) 5 MG/0.5ML Pen Inject 5 mg into the skin once a week. 6 mL 1   No current facility-administered medications on file prior to visit.  ALLERGIES: Allergies  Allergen Reactions   Shrimp [Shellfish Allergy] Anaphylaxis    FAMILY HISTORY: Family History  Problem Relation Age of Onset   Asthma Mother    Other Mother        kidney immune disease -had kidney transplant   Diabetes Father    Prostate cancer Father    Osteoarthritis Sister    Diabetes Sister    Colon cancer Neg Hx    Colon polyps Neg Hx    Esophageal cancer Neg Hx    Rectal cancer Neg Hx    Stomach cancer Neg Hx       Objective:  *** General: No acute distress.  Patient appears ***-groomed.   Head:  Normocephalic/atraumatic Eyes:  Fundi examined but not visualized Neck: supple, no paraspinal tenderness, full range of motion Heart:  Regular rate and rhythm Lungs:  Clear to auscultation bilaterally Back: No paraspinal  tenderness Neurological Exam: alert and oriented.  Speech fluent and not dysarthric, language intact.  CN II-XII intact. Bulk and tone normal, muscle strength 5/5 throughout.  Sensation to light touch intact.  Deep tendon reflexes 2+ throughout, toes downgoing.  Finger to nose testing intact.  Gait normal, Romberg negative.   Shon Millet, DO  CC: ***

## 2022-11-17 NOTE — Progress Notes (Signed)
Diabetes Self-Management Education  Visit Type: First/Initial  Appt. Start Time: 0845 Appt. End Time: 0950  11/17/2022  Derrick Scott, identified by name and date of birth, is a 47 y.o. male with a diagnosis of Diabetes: Type 2.   ASSESSMENT  Patient is here today alone. Patient would like to learn about vegetarian meal pattern and fueling for physical activity. Pt reports he has recently tried juicing and protein powder. Patient lives with wife. They both share the shopping and cooking. Pt reports he and his wife also shop and assist with meal prep with his in laws also. Pt reports frequent bowel movement daily stating this is his normal.  Pt reports making the following changes including decreasing red meat and fried foods. Pt reports he feels he should eliminate carbohydrates to promote weight reductions. Pt reports his stress is 8 of 10 and states sleep is poor. Pt reports sleep improving with melatonin. Pt reports social support and enjoying exercise. Pt reports he has caught himself stress eating. Pt reports eating out ~ 4 times weekly.    History includes:   Past Medical History:  Diagnosis Date   Arthritis    knee bilateral   Asthma    as a child "NO attacks"   Diabetes mellitus without complication (HCC)    border line no medicationa   GERD (gastroesophageal reflux disease)    High cholesterol    Medial meniscus tear 01/2012   right knee   Obesity    Pneumonia    Renal disorder    hematuria,kidney stones   Sleep apnea    Vertigo, benign paroxysmal 2015    Medications include:  Current Outpatient Medications:    cetirizine (ZYRTEC) 10 MG tablet, Take 1 tablet (10 mg total) by mouth daily., Disp: 10 tablet, Rfl: 0   losartan (COZAAR) 100 MG tablet, Take 1 tablet (100 mg total) by mouth daily., Disp: 90 tablet, Rfl: 3   meclizine (ANTIVERT) 25 MG tablet, Take 1 tablet (25 mg total) by mouth 3 (three) times daily as needed for dizziness (do not use for more than  3days)., Disp: 21 tablet, Rfl: 0   meloxicam (MOBIC) 15 MG tablet, Take 1 tablet (15 mg total) by mouth daily., Disp: 10 tablet, Rfl: 0   metFORMIN (GLUCOPHAGE) 500 MG tablet, Take 0.5 tablets (250 mg total) by mouth 2 (two) times daily with a meal., Disp: 90 tablet, Rfl: 3   omeprazole (PRILOSEC) 40 MG capsule, Take 1 capsule (40 mg total) by mouth 2 (two) times daily., Disp: 60 capsule, Rfl: 5   rizatriptan (MAXALT) 5 MG tablet, Take 1-2 tablets (5-10 mg total) by mouth as needed for migraine. May repeat in 2 hours if needed. No more than 30mg  in 24hrs, Disp: 10 tablet, Rfl: 0   tirzepatide (MOUNJARO) 5 MG/0.5ML Pen, Inject 5 mg into the skin once a week., Disp: 6 mL, Rfl: 1   Labs noted:   Lab Results  Component Value Date   CHOL 122 03/31/2022   HDL 34.50 (L) 03/31/2022   LDLCALC 59 03/31/2022   TRIG 142.0 03/31/2022   CHOLHDL 4 03/31/2022   BMI Readings from Last 3 Encounters:  11/16/22 43.52 kg/m  10/18/22 42.88 kg/m  10/07/22 42.15 kg/m    There were no vitals taken for this visit. There is no height or weight on file to calculate BMI.   Diabetes Self-Management Education - 11/17/22 0851       Visit Information   Visit Type First/Initial  Initial Visit   Diabetes Type Type 2    Date Diagnosed 1-2 years ago    Are you currently following a meal plan? No    Are you taking your medications as prescribed? Yes      Health Coping   How would you rate your overall health? Good      Psychosocial Assessment   Patient Belief/Attitude about Diabetes Motivated to manage diabetes    What is the hardest part about your diabetes right now, causing you the most concern, or is the most worrisome to you about your diabetes?   Making healty food and beverage choices    Self-care barriers None    Self-management support Doctor's office    Other persons present Patient    Patient Concerns Nutrition/Meal planning    Special Needs None    Preferred Learning Style No preference  indicated    Learning Readiness Change in progress    How often do you need to have someone help you when you read instructions, pamphlets, or other written materials from your doctor or pharmacy? 1 - Never    What is the last grade level you completed in school? 12th      Pre-Education Assessment   Patient understands the diabetes disease and treatment process. Needs Instruction    Patient understands incorporating nutritional management into lifestyle. Needs Instruction    Patient undertands incorporating physical activity into lifestyle. Needs Instruction    Patient understands using medications safely. Needs Instruction    Patient understands monitoring blood glucose, interpreting and using results Needs Instruction    Patient understands prevention, detection, and treatment of acute complications. Needs Instruction    Patient understands prevention, detection, and treatment of chronic complications. Needs Instruction    Patient understands how to develop strategies to address psychosocial issues. Needs Instruction    Patient understands how to develop strategies to promote health/change behavior. Needs Instruction      Complications   Last HgB A1C per patient/outside source 6.1 %    How often do you check your blood sugar? --   1-2 times weekly   Fasting Blood glucose range (mg/dL) 30-865    Postprandial Blood glucose range (mg/dL) 784-696    Number of hypoglycemic episodes per month 0    Have you had a dilated eye exam in the past 12 months? Yes    Have you had a dental exam in the past 12 months? No    Are you checking your feet? No      Dietary Intake   Breakfast skips    Lunch ~2-3p: grilled nuggets, fries, sweet tea or almonds/cashews or skips or    Dinner ~8pm: egg salad, water or crusted parm chicken, grilled corn, mashed potatoes or fried wings, peas, spinach, noodles, sweet poates    Snack (evening) apple, kale, berries, cherries blended into juice    Beverage(s) sweet  tea, lemonade, water, no added sugar juice      Activity / Exercise   Activity / Exercise Type Moderate (swimming / aerobic walking)   biking, walking, weights   How many days per week do you exercise? 2    How many minutes per day do you exercise? 45    Total minutes per week of exercise 90      Patient Education   Previous Diabetes Education No    Disease Pathophysiology Definition of diabetes, type 1 and 2, and the diagnosis of diabetes;Explored patient's options for treatment of their diabetes  Healthy Eating Role of diet in the treatment of diabetes and the relationship between the three main macronutrients and blood glucose level;Plate Method;Reviewed blood glucose goals for pre and post meals and how to evaluate the patients' food intake on their blood glucose level.;Meal options for control of blood glucose level and chronic complications.    Being Active Role of exercise on diabetes management, blood pressure control and cardiac health.    Medications Reviewed patients medication for diabetes, action, purpose, timing of dose and side effects.    Monitoring Identified appropriate SMBG and/or A1C goals.;Purpose and frequency of SMBG.    Acute complications Taught prevention, symptoms, and  treatment of hypoglycemia - the 15 rule.    Chronic complications Relationship between chronic complications and blood glucose control;Retinopathy and reason for yearly dilated eye exams;Dental care    Diabetes Stress and Support Identified and addressed patients feelings and concerns about diabetes;Worked with patient to identify barriers to care and solutions;Role of stress on diabetes;Helped patient identify a support system for diabetes management    Lifestyle and Health Coping Lifestyle issues that need to be addressed for better diabetes care      Individualized Goals (developed by patient)   Nutrition Follow meal plan discussed    Physical Activity Exercise 3-5 times per week    Medications  take my medication as prescribed    Monitoring  Test my blood glucose as discussed    Problem Solving Sleep Pattern    Reducing Risk examine blood glucose patterns;do foot checks daily    Health Coping Discuss barriers to diabetes care with support person/system (comment specifics as needed)      Post-Education Assessment   Patient understands the diabetes disease and treatment process. Needs Review    Patient understands incorporating nutritional management into lifestyle. Needs Review    Patient undertands incorporating physical activity into lifestyle. Needs Review    Patient understands using medications safely. Needs Review    Patient understands monitoring blood glucose, interpreting and using results Needs Review    Patient understands prevention, detection, and treatment of acute complications. Needs Review    Patient understands prevention, detection, and treatment of chronic complications. Needs Review    Patient understands how to develop strategies to address psychosocial issues. Needs Review    Patient understands how to develop strategies to promote health/change behavior. Needs Review      Outcomes   Expected Outcomes Demonstrated interest in learning. Expect positive outcomes    Future DMSE 4-6 wks    Program Status Not Completed             Individualized Plan for Diabetes Self-Management Training:   Learning Objective:  Patient will have a greater understanding of diabetes self-management. Patient education plan is to attend individual and/or group sessions per assessed needs and concerns.   Plan:   Patient Instructions  1-Avoid skipping meals and aim for balance Try the plate planner 1/2 plate of non starchy vegetables, 1/4 protein, 1/4 carbohydrate  or Carbohydrate Balanced Snack List  2- Try to avoid screen time an hour before bedtime nightly in an effort to improve sleep    Expected Outcomes:  Demonstrated interest in learning. Expect positive  outcomes  Education material provided: ADA - How to Thrive: A Guide for Your Journey with Diabetes, My Plate, Support group flyer, and Diabetes Resources  If problems or questions, patient to contact team via:  Phone  Future DSME appointment: 4-6 wks

## 2022-11-17 NOTE — Patient Instructions (Addendum)
1-Avoid skipping meals and aim for balance Try the plate planner 1/2 plate of non starchy vegetables, 1/4 protein, 1/4 carbohydrate  or Carbohydrate Balanced Snack List  2- Try to avoid screen time an hour before bedtime nightly in an effort to improve sleep

## 2022-11-17 NOTE — Assessment & Plan Note (Signed)
Type 2 diabetes mellitus without long-term use of insulin: He continues on metformin.  He had an A1c 2 months ago that was 6.1.  Will follow-up at next visit with repeat A1c.

## 2022-11-17 NOTE — Assessment & Plan Note (Signed)
Reviewed the workup at [facility].  I do not know for sure that his vertigo is unrelated to this but I suspect it is unrelated.

## 2022-11-17 NOTE — Assessment & Plan Note (Addendum)
Blood pressure is not well-controlled.  Will increase his medication regimen from 25 mg losartan to 100 mg losartan.  And follow-up 4 weeks.  Reviewed his recent lab work from [facility] and has normal potassium and normal kidney function.  Will recheck when I see him back.

## 2022-11-17 NOTE — Assessment & Plan Note (Signed)
>>  ASSESSMENT AND PLAN FOR ADRENAL MASS, RIGHT (HCC) WRITTEN ON 11/17/2022  5:49 PM BY Lizmarie Witters L, MD  Reviewed the workup at [facility].  I do not know for sure that his vertigo is unrelated to this but I suspect it is unrelated.

## 2022-11-21 ENCOUNTER — Other Ambulatory Visit: Payer: Self-pay | Admitting: Physician Assistant

## 2022-11-21 ENCOUNTER — Ambulatory Visit: Payer: BC Managed Care – PPO | Admitting: Neurology

## 2022-11-25 ENCOUNTER — Encounter (INDEPENDENT_AMBULATORY_CARE_PROVIDER_SITE_OTHER): Payer: Self-pay | Admitting: Otolaryngology

## 2022-11-27 ENCOUNTER — Encounter: Payer: Self-pay | Admitting: Family Medicine

## 2022-11-27 DIAGNOSIS — G4733 Obstructive sleep apnea (adult) (pediatric): Secondary | ICD-10-CM | POA: Diagnosis not present

## 2022-12-02 ENCOUNTER — Telehealth: Payer: Self-pay | Admitting: Family Medicine

## 2022-12-02 NOTE — Telephone Encounter (Signed)
FMLA paperwork completed. Copy to chart and faxed.His ROI is also copied to chart FMLA sent to [employer] [phone]

## 2022-12-05 NOTE — Telephone Encounter (Signed)
Patient called and informed that forms are ready for pick up. Copy made and placed in batch scanning. Original placed at front desk for pick up.   Forms faxed to [phone]. ROI on file.   Veronda Prude, RN

## 2022-12-05 NOTE — Telephone Encounter (Signed)
FMLA forms filled out and faxed

## 2022-12-06 ENCOUNTER — Encounter: Payer: Self-pay | Admitting: Family Medicine

## 2022-12-07 ENCOUNTER — Telehealth: Payer: Self-pay | Admitting: Family Medicine

## 2022-12-07 NOTE — Telephone Encounter (Signed)
patient dropped off form at front desk for [facility].  Verified that patient section of form has been completed.  Last DOS/WCC with PCP was 11/16/22.  Placed form in green team folder to be completed by clinical staff.  Vilinda Blanks

## 2022-12-07 NOTE — Telephone Encounter (Signed)
Pt states FMLA is urgent

## 2022-12-08 NOTE — Telephone Encounter (Signed)
Reviewed form and placed in PCP's box for completion.  .Dontrelle Mazon R Damoni Erker, CMA  

## 2022-12-12 ENCOUNTER — Encounter: Payer: Self-pay | Admitting: Family Medicine

## 2022-12-12 ENCOUNTER — Ambulatory Visit (INDEPENDENT_AMBULATORY_CARE_PROVIDER_SITE_OTHER): Payer: BC Managed Care – PPO | Admitting: Family Medicine

## 2022-12-12 VITALS — BP 118/82 | HR 94 | Temp 98.6°F | Ht 68.0 in | Wt 282.0 lb

## 2022-12-12 DIAGNOSIS — E1165 Type 2 diabetes mellitus with hyperglycemia: Secondary | ICD-10-CM

## 2022-12-12 DIAGNOSIS — Z6841 Body Mass Index (BMI) 40.0 and over, adult: Secondary | ICD-10-CM

## 2022-12-12 DIAGNOSIS — Z7984 Long term (current) use of oral hypoglycemic drugs: Secondary | ICD-10-CM

## 2022-12-12 DIAGNOSIS — E66813 Obesity, class 3: Secondary | ICD-10-CM | POA: Diagnosis not present

## 2022-12-12 DIAGNOSIS — I10 Essential (primary) hypertension: Secondary | ICD-10-CM | POA: Diagnosis not present

## 2022-12-12 DIAGNOSIS — G4733 Obstructive sleep apnea (adult) (pediatric): Secondary | ICD-10-CM | POA: Diagnosis not present

## 2022-12-12 NOTE — Assessment & Plan Note (Signed)
Lab Results  Component Value Date   HGBA1C 6.1 (A) 10/07/2022   He has good control of T2DM on metformin 250 mg bid (due to GI disturbance) and Mounjaro 5 mg weekly.  He initially saw weight loss but this has slowed down.  He denies issues with nausea, heartburn or constipation.  He admits to a poor diet and lack of exercise  We discussed the importance of healthy diet and regular exercise for the treatment of T2DM He will return for IC, fasting labs and EKG Consider increasing dose of Mounjaro

## 2022-12-12 NOTE — Assessment & Plan Note (Signed)
On CPAP since 2020, tolerating fairly well  Look for improvements in OSA with weight loss Aim for 8 hrs of sleep nightly wearing CPAP

## 2022-12-12 NOTE — Assessment & Plan Note (Signed)
BP is well controlled on Losartan 100 mg daily Denies CP or headaches He is seeing endocrinology for a R adrenal adenoma

## 2022-12-12 NOTE — Progress Notes (Signed)
Office: [phone]  /  Fax: [phone]   Initial Visit  Derrick Scott was seen in clinic today to evaluate for obesity. He is interested in losing weight to improve overall health and reduce the risk of weight related complications. He presents today to review program treatment options, initial physical assessment, and evaluation.     He was referred by: PCP  When asked what else they would like to accomplish? He states: Adopt healthier eating patterns, Improve existing medical conditions, and Improve quality of life  he would like to be 215 lb.  Has not been this weight since 2002.   [place] [employer] [place]; 2 kids grown  Weight history:  gained 260 lb to 311 lb in 2020 and has been stuck at 280 lb x 7 mos.    When asked how has your weight affected you? He states: Contributed to medical problems, Contributed to orthopedic problems or mobility issues, Having fatigue, and Problems with eating patterns  Some associated conditions: Hypertension, Hyperlipidemia, Fatty liver disease, OSA, and Diabetes  Contributing factors: Family history of obesity, Moderate to high levels of stress, and Eating patterns  Weight promoting medications identified: None  Current nutrition plan: None  Current level of physical activity: NEAT  Current or previous pharmacotherapy: GLP-1, Metformin, and GLP-1 + GIP Lost weight on Ozempic; changed to [employer]-- weight loss has plateaued On metformin 250 mg bid   Response to medication:  doing well on Mounjaro but weight loss has stalled   Past medical history includes:   Past Medical History:  Diagnosis Date   Arthritis    knee bilateral   Asthma    as a child "NO attacks"   Diabetes mellitus without complication (HCC)    border line no medicationa   GERD (gastroesophageal reflux disease)    High cholesterol    Medial meniscus tear 01/2012   right knee   Obesity    Pneumonia    Renal disorder     hematuria,kidney stones   Sleep apnea    Vertigo, benign paroxysmal 2015     Objective:   BP 118/82   Pulse 94   Temp 98.6 F (37 C)   Ht 5\' 8"  (1.727 m)   Wt 282 lb (127.9 kg)   SpO2 98%   BMI 42.88 kg/m  He was weighed on the bioimpedance scale: Body mass index is 42.88 kg/m.  Peak Weight:311 , Body Fat%:36.4, Visceral Fat Rating:23, Weight trend over the last 12 months: Decreasing  General:  Alert, oriented and cooperative. Patient is in no acute distress.  Respiratory: Normal respiratory effort, no problems with respiration noted   Gait: able to ambulate independently  Mental Status: Normal mood and affect. Normal behavior. Normal judgment and thought content.   DIAGNOSTIC DATA REVIEWED:  BMET    Component Value Date/Time   NA 136 09/28/2022 2301   NA 137 05/18/2016 1001   K 3.8 09/28/2022 2301   CL 103 09/28/2022 2301   CO2 23 09/28/2022 2301   GLUCOSE 121 (H) 09/28/2022 2301   BUN 15 09/28/2022 2301   BUN 19 05/18/2016 1001   CREATININE 1.19 09/28/2022 2301   CREATININE 1.26 10/24/2012 1142   CALCIUM 8.9 09/28/2022 2301   GFRNONAA >60 09/28/2022 2301   GFRAA >60 10/21/2019 0858   Lab Results  Component Value Date   HGBA1C 6.1 (A) 10/07/2022   HGBA1C 6.2 (A) 12/25/2019   No results found for: "INSULIN" CBC  Component Value Date/Time   WBC 8.6 09/28/2022 2301   RBC 5.15 09/28/2022 2301   HGB 14.8 09/28/2022 2301   HCT 43.5 09/28/2022 2301   PLT 302 09/28/2022 2301   MCV 84.5 09/28/2022 2301   MCH 28.7 09/28/2022 2301   MCHC 34.0 09/28/2022 2301   RDW 13.3 09/28/2022 2301   Iron/TIBC/Ferritin/ %Sat No results found for: "IRON", "TIBC", "FERRITIN", "IRONPCTSAT" Lipid Panel     Component Value Date/Time   CHOL 122 03/31/2022 0952   CHOL 248 (H) 12/16/2020 0906   TRIG 142.0 03/31/2022 0952   HDL 34.50 (L) 03/31/2022 0952   HDL 45 12/16/2020 0906   CHOLHDL 4 03/31/2022 0952   VLDL 28.4 03/31/2022 0952   LDLCALC 59 03/31/2022 0952   LDLCALC  168 (H) 12/16/2020 0906   Hepatic Function Panel     Component Value Date/Time   PROT 8.1 09/28/2022 2301   PROT 7.7 05/18/2016 1001   ALBUMIN 4.1 09/28/2022 2301   ALBUMIN 4.2 05/18/2016 1001   AST 27 09/28/2022 2301   ALT 35 09/28/2022 2301   ALKPHOS 71 09/28/2022 2301   BILITOT 0.7 09/28/2022 2301   BILITOT 0.3 05/18/2016 1001      Component Value Date/Time   TSH 2.488 01/06/2021 2141     Assessment and Plan:   Type 2 diabetes mellitus with hyperglycemia, without long-term current use of insulin (HCC) Assessment & Plan: Lab Results  Component Value Date   HGBA1C 6.1 (A) 10/07/2022   He has good control of T2DM on metformin 250 mg bid (due to GI disturbance) and Mounjaro 5 mg weekly.  He initially saw weight loss but this has slowed down.  He denies issues with nausea, heartburn or constipation.  He admits to a poor diet and lack of exercise  We discussed the importance of healthy diet and regular exercise for the treatment of T2DM He will return for IC, fasting labs and EKG Consider increasing dose of Mounjaro   Class 3 severe obesity due to excess calories with serious comorbidity and body mass index (BMI) of 40.0 to 44.9 in adult (HCC)  OSA on CPAP Assessment & Plan: On CPAP since 2020, tolerating fairly well  Look for improvements in OSA with weight loss Aim for 8 hrs of sleep nightly wearing CPAP   Primary hypertension Assessment & Plan: BP is well controlled on Losartan 100 mg daily Denies CP or headaches He is seeing endocrinology for a R adrenal adenoma         Obesity Treatment / Action Plan:  Patient will work on garnering support from family and friends to begin weight loss journey. Will work on eliminating or reducing the presence of highly palatable, calorie dense foods in the home. Will complete provided nutritional and psychosocial assessment questionnaire before the next appointment. Will be scheduled for indirect calorimetry to  determine resting energy expenditure in a fasting state.  This will allow [place] to create a reduced calorie, high-protein meal plan to promote loss of fat mass while preserving muscle mass. Will think about ideas on how to incorporate physical activity into their daily routine. Counseled on the health benefits of losing 5%-15% of total body weight. Was counseled on nutritional approaches to weight loss and benefits of reducing processed foods and consuming plant-based foods and high quality protein as part of nutritional weight management. Was counseled on pharmacotherapy and role as an adjunct in weight management.   Obesity Education Performed Today:  He was weighed on the bioimpedance scale and  results were discussed and documented in the synopsis.  We discussed obesity as a disease and the importance of a more detailed evaluation of all the factors contributing to the disease.  We discussed the importance of long term lifestyle changes which include nutrition, exercise and behavioral modifications as well as the importance of customizing this to his specific health and social needs.  We discussed the benefits of reaching a healthier weight to alleviate the symptoms of existing conditions and reduce the risks of the biomechanical, metabolic and psychological effects of obesity.  Derrick Scott appears to be in the action stage of change and states they are ready to start intensive lifestyle modifications and behavioral modifications.  30 minutes was spent today on this visit including the above counseling, pre-visit chart review, and post-visit documentation.  Reviewed by clinician on day of visit: allergies, medications, problem list, medical history, surgical history, family history, social history, and previous encounter notes pertinent to obesity diagnosis.    Seymour Bars, D.O. DABFM, DABOM [facility] [phone] [place], [place] [address] [phone]

## 2022-12-13 ENCOUNTER — Other Ambulatory Visit: Payer: Self-pay

## 2022-12-13 NOTE — Telephone Encounter (Signed)
Forms faxed to provided number. Copy made and placed in batch scanning.   Veronda Prude, RN

## 2022-12-15 ENCOUNTER — Encounter: Payer: Self-pay | Admitting: Family Medicine

## 2022-12-27 DIAGNOSIS — G4733 Obstructive sleep apnea (adult) (pediatric): Secondary | ICD-10-CM | POA: Diagnosis not present

## 2022-12-28 ENCOUNTER — Other Ambulatory Visit (HOSPITAL_COMMUNITY): Payer: Self-pay

## 2022-12-28 ENCOUNTER — Ambulatory Visit: Payer: BC Managed Care – PPO | Admitting: Family Medicine

## 2022-12-28 ENCOUNTER — Encounter: Payer: Self-pay | Admitting: Family Medicine

## 2022-12-28 VITALS — BP 126/82 | HR 93 | Ht 68.0 in | Wt 288.8 lb

## 2022-12-28 DIAGNOSIS — G43009 Migraine without aura, not intractable, without status migrainosus: Secondary | ICD-10-CM | POA: Diagnosis not present

## 2022-12-28 DIAGNOSIS — E1165 Type 2 diabetes mellitus with hyperglycemia: Secondary | ICD-10-CM

## 2022-12-28 DIAGNOSIS — R42 Dizziness and giddiness: Secondary | ICD-10-CM | POA: Diagnosis not present

## 2022-12-28 DIAGNOSIS — F418 Other specified anxiety disorders: Secondary | ICD-10-CM

## 2022-12-28 DIAGNOSIS — D3501 Benign neoplasm of right adrenal gland: Secondary | ICD-10-CM

## 2022-12-28 DIAGNOSIS — I10 Essential (primary) hypertension: Secondary | ICD-10-CM

## 2022-12-28 MED ORDER — TIRZEPATIDE-WEIGHT MANAGEMENT 7.5 MG/0.5ML ~~LOC~~ SOAJ: 7.5000 mg | SUBCUTANEOUS | 3 refills | Status: DC

## 2022-12-28 MED ORDER — FLUOXETINE HCL 20 MG PO TABS: 20.0000 mg | ORAL_TABLET | Freq: Every day | ORAL | 3 refills | Status: DC

## 2022-12-28 MED ORDER — KETOROLAC TROMETHAMINE 60 MG/2ML IM SOLN
60.0000 mg | Freq: Once | INTRAMUSCULAR | Status: AC
Start: 2022-12-28 — End: 2022-12-28
  Administered 2022-12-28: 60 mg via INTRAMUSCULAR

## 2022-12-28 NOTE — Patient Instructions (Signed)
Today we gave you a Toradol shot for your migraine.  I would recommend going home and resting for the rest of the day. I am also starting you on low-dose fluoxetine for stress.  I think this is a very good thing that we need to do for your response to stress right now.  I also think the stressors are increasing your frequency of migraines.  Let me know if you have any issues with that otherwise I will plan to see you back sometime in January.  Please make your appointment at your convenience in January.  Please call in the interim or send me a MyChart message if you have any concerns or questions.  Great to see you!  I am also sending in the increased dose of Mounjaro.

## 2022-12-29 NOTE — Assessment & Plan Note (Signed)
Currently on treatment with Cozaar.  Initially slightly elevated blood pressure today but he is experiencing a migraine.  Recheck of vitals much better.  Will check again when I see him back in a month.  Continue Cozaar for now.

## 2022-12-29 NOTE — Assessment & Plan Note (Signed)
Has follow-up at [facility] in January.  He does wonder if this is contributing to his intermittent elevated blood pressure and his headaches.

## 2022-12-29 NOTE — Assessment & Plan Note (Signed)
He has appointment with ENT for further evaluation in January.  They did receive his FMLA paperwork.

## 2022-12-29 NOTE — Assessment & Plan Note (Signed)
Toradol injection today see if we can help him get control of his pain level.  This is a typical migraine for him so I do not think we need to do any further workup at this time.  Will follow-up when I see him in January.  If the migraine of course does not resolve as typical, he will let me know

## 2022-12-29 NOTE — Progress Notes (Signed)
    CHIEF COMPLAINT / HPI: #1.  Migraine: Started earlier this morning.  Photosensitivity, headache.  Typical for him.  He has taken some over-the-counter Tylenol.  Not helping much. 2.  Notices that he is having some jittery feelings particularly at night.  Having difficulty falling asleep.  He thinks is probably stress as he finds himself worrying about things.  Feels anxious.  Happens occasionally during the day but usually he is too busy at work for it to impact him much.  Not getting any regular exercise other than what he gets at work. 3.  Has an appointment for ENT evaluation in January for his vertigo.  He continues to have about 1 or 2 episodes a week.  Fortunately these do not always occur at work. 4.  Diabetes mellitus and obesity: Has been on Mounjaro through [facility].  They have told him it is fine to increase his dose he wants to know if I can go ahead give him a new prescription for that today.   PERTINENT  PMH / PSH: I have reviewed the patient's medications, allergies, past medical and surgical history, smoking status and updated in the EMR as appropriate.  Latest Reference Range & Units Most Recent 09/21/21 13:53 12/28/21 08:48 03/31/22 09:52 07/05/22 08:48 10/07/22 09:31  Hemoglobin A1C 4.0 - 5.6 % 6.1 ! 10/07/22 09:31 7.4 ! 7.0 ! 6.7 (H) 6.1 ! 6.1 !    OBJECTIVE:  BP 126/82   Pulse 93   Ht 5\' 8"  (1.727 m)   Wt 288 lb 12.8 oz (131 kg)   SpO2 98%   BMI 43.91 kg/m   GENERAL: Well-developed, squints his eyes and looks pretty miserable from his migraine headache.  Photophobic. GAIT: Normal. MSK: Rises from a chair without any issue. NEURO: No gross focal deficits other than photophobia. PSYCH: AxOx4. Good eye contact.. No psychomotor retardation or agitation. Appropriate speech fluency and content. Asks and answers questions appropriately. Mood is congruent.  ASSESSMENT / PLAN:   No problem-specific Assessment & Plan notes found for this encounter.    Denny Levy MD

## 2022-12-29 NOTE — Assessment & Plan Note (Signed)
Reviewed his A1c.  He has pretty good control.  Will increase his dose of Mounjaro and follow-up in 1 month.

## 2023-01-02 ENCOUNTER — Encounter: Payer: Self-pay | Admitting: Family Medicine

## 2023-01-02 ENCOUNTER — Other Ambulatory Visit (HOSPITAL_COMMUNITY): Payer: Self-pay

## 2023-01-02 ENCOUNTER — Institutional Professional Consult (permissible substitution) (INDEPENDENT_AMBULATORY_CARE_PROVIDER_SITE_OTHER): Payer: BC Managed Care – PPO

## 2023-01-03 ENCOUNTER — Other Ambulatory Visit: Payer: Self-pay | Admitting: Family Medicine

## 2023-01-03 MED ORDER — TIRZEPATIDE 7.5 MG/0.5ML ~~LOC~~ SOAJ: 7.5000 mg | SUBCUTANEOUS | 2 refills | Status: DC

## 2023-01-04 ENCOUNTER — Encounter: Payer: BC Managed Care – PPO | Admitting: Nurse Practitioner

## 2023-01-05 DIAGNOSIS — E119 Type 2 diabetes mellitus without complications: Secondary | ICD-10-CM | POA: Diagnosis not present

## 2023-01-06 DIAGNOSIS — E279 Disorder of adrenal gland, unspecified: Secondary | ICD-10-CM | POA: Diagnosis not present

## 2023-01-09 ENCOUNTER — Encounter: Payer: BC Managed Care – PPO | Admitting: Nurse Practitioner

## 2023-01-17 ENCOUNTER — Ambulatory Visit (INDEPENDENT_AMBULATORY_CARE_PROVIDER_SITE_OTHER): Payer: BC Managed Care – PPO | Admitting: Otolaryngology

## 2023-01-17 ENCOUNTER — Encounter (INDEPENDENT_AMBULATORY_CARE_PROVIDER_SITE_OTHER): Payer: Self-pay

## 2023-01-17 VITALS — HR 111 | Ht 68.0 in | Wt 280.0 lb

## 2023-01-17 DIAGNOSIS — D3501 Benign neoplasm of right adrenal gland: Secondary | ICD-10-CM

## 2023-01-17 DIAGNOSIS — H8111 Benign paroxysmal vertigo, right ear: Secondary | ICD-10-CM

## 2023-01-17 DIAGNOSIS — R42 Dizziness and giddiness: Secondary | ICD-10-CM

## 2023-01-17 NOTE — Progress Notes (Signed)
 Dear Dr. Rosalynn, Here is my assessment for our mutual patient, Derrick Scott. Thank you for allowing me the opportunity to care for your patient. Please do not hesitate to contact me should you have any other questions. Sincerely, Dr. Eldora Blanch  Otolaryngology Clinic Note Referring provider: Dr. Rosalynn HPI:  Derrick Scott is a 47 y.o. male kindly referred by Dr. Rosalynn for evaluation of vertigo.  Initial visit (12/2022): Patient reports: he has had problems with vertigo (room spinning) since 2017 with one brief episode, then did not happen until 2023, and then became more frequent. He reports current frequency is sometimes weekly, or maybe twice per day. He reports that each episode is seconds, but sometimes will have multiple episodes. Triggers are looking down, turning his head typically brings it about. No other triggers. He also gets nauseated. He takes meclizine  when he has episodes, and it only helps sometimes. He has had Vestibular therapy (05/2022, 10/2022) at [facility] and epley does help. He tries to stay hydrated, thinks maybe dehydration brings it about. Not always associated with headache. Does have Migraine sx include blurred vision, nausea, light and sound sensitivity. No balance issues. He does have some lightheadedness when he gets up.   He does have a history of migraines. He also has a adrenal nodule which was recently diagnosed and is being worked for this at [facility].  Patient denies: ear pain, fullness, drainage, tinnitus, hearing loss Patient additionally denies: deep pain in ear canal, eustachian tube symptoms such as popping, crackling, sensitive to pressure changes Patient also denies barotrauma,ototoxic medication use Prior ear surgery: no No recent hearing tests  H&N Surgery: no Personal or FHx of bleeding dz or anesthesia difficulty: no  GLP-1: Mounjaro  AP/AC: no  Tobacco: no. Alcohol: no. 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 Lives in [place], [place]  PMHx:  T2DM, Adrenal nodule (secreting - NE 1100), HTN, Scott disorder, and Migraines  Independent Review of Additional Tests or Records:  Dr. Rosalynn (12/28/2022 and 11/16/2022): In Oct had vertigo -- query BPPV v/s Meniere's; ref Epley exercises; Ref to ENT; Migraine, incl photosens and headaache; fair amount of stress; vertigo epi 1-2 times per week; Dx: Vertigo, Rx: ref to ENT ED visit Derrick Scott Vertigo 10/24/2020: dizziness, when waking up; similar to prior episode, when looking right to worse w/positional movement; some N/V; Dx: Unclear, did not think BPPV? Rx: Meclizine  PRN Derrick Scott 02/17/2021: Dizzness x3d, intermittent, worse with right head turn and position changes; h/o BPPV; slight improvement with meclizine ; Dx: BPPV, Meclizine  PRN 03/04/2021 Derrick Scott - Vertigo x26 hrs; Dx: BPPV v/s Meniere's; Rx: Vestibular rehab, clonazepam  PRN for meniere's; ref to Neuro Derrick Scott 03/12/2021: Noted vertigo, ongoing since 2016, induced by head turn; occurs 3-4 days; Dx: BPPV; do not suspect migraine; check MRI brain, which was reassuring/ f/u PRN Derrick Scott 06/17/2022: Vertigo; Dx: unclear; Rx: meclizine  PRN and EPley Vestib eval (2023) multiple visits [facility] Derrick Scott - 03/05/2021: independently reviewed, unable to elicit nystagmus including negative OM exam, negative HIT and positional testing; sx consistent with BPPV and did epley in 4/202/23 and this resolved vertigo  PT Rehab 10/2022 (vestibular) - Derrick Scott - Noted vertigo, episodic, epley will make it go away; vestibular assessment - normal it appears including dix-hallpike; Dx: BPPV but could also be BP reslated; PT PRN if has dizzy episode CT Neck (2022) independently reviewed with attention to mastoids and ear: no mastoids or ME effusion; cuts somewhat thick but no otic capsule or ossicular chain abnormality noted MRI Brain (  2023): no contrast but no noted mastoid effusion or large retrocochclear lesions on independent  review  PMH/Meds/All/SocHx/FamHx/ROS:   Past Medical History:  Diagnosis Date   Arthritis    knee bilateral   Asthma    as a child NO attacks   Diabetes mellitus without complication (HCC)    border line no medicationa   GERD (gastroesophageal reflux disease)    High cholesterol    Medial meniscus tear 01/2012   right knee   Obesity    Pneumonia    Renal disorder    hematuria,kidney stones   Sleep apnea    Vertigo, benign paroxysmal 2015     Past Surgical History:  Procedure Laterality Date   CORNEAL TRANSPLANT Left 12/17/2009   KNEE ARTHROSCOPY  02/20/2012   Procedure: ARTHROSCOPY KNEE;  Surgeon: Norleen LITTIE Gavel, MD;  Location: [facility];  Service: Orthopedics;  Laterality: Right;  Chondroplasia patella femoral joint, medial meniscal debriedment    Family History  Problem Relation Age of Onset   Asthma Mother    Other Mother        kidney immune disease -had kidney transplant   Diabetes Father    Prostate cancer Father    Osteoarthritis Sister    Diabetes Sister    Colon cancer Neg Hx    Colon polyps Neg Hx    Esophageal cancer Neg Hx    Rectal cancer Neg Hx    Stomach cancer Neg Hx      Social Connections: Socially Isolated (11/16/2022)   Social Connection and Isolation Panel [NHANES]    Frequency of Communication with Friends and Family: Once a week    Frequency of Social Gatherings with Friends and Family: Once a week    Attends Religious Services: Never    Database Administrator or Organizations: No    Attends Engineer, Structural: Not on file    Marital Status: Married      Current Outpatient Medications:    atorvastatin  (LIPITOR) 40 MG tablet, Take 40 mg by mouth daily., Disp: , Rfl:    cetirizine  (ZYRTEC ) 10 MG tablet, Take 1 tablet (10 mg total) by mouth daily., Disp: 10 tablet, Rfl: 0   losartan  (COZAAR ) 100 MG tablet, Take 1 tablet (100 mg total) by mouth daily., Disp: 90 tablet, Rfl: 3   meclizine  (ANTIVERT ) 25 MG  tablet, Take 1 tablet (25 mg total) by mouth 3 (three) times daily as needed for dizziness (do not use for more than 3days)., Disp: 21 tablet, Rfl: 0   meloxicam  (MOBIC ) 15 MG tablet, Take 1 tablet by mouth daily., Disp: , Rfl:    metFORMIN  (GLUCOPHAGE ) 500 MG tablet, Take 0.5 tablets (250 mg total) by mouth 2 (two) times daily with a meal., Disp: 90 tablet, Rfl: 3   omeprazole  (PRILOSEC) 40 MG capsule, TAKE 1 CAPSULE BY MOUTH TWICE A DAY, Disp: 180 capsule, Rfl: 1   rizatriptan  (MAXALT ) 5 MG tablet, Take 1-2 tablets (5-10 mg total) by mouth as needed for migraine. May repeat in 2 hours if needed. No more than 30mg  in 24hrs, Disp: 10 tablet, Rfl: 0   tirzepatide  (MOUNJARO ) 7.5 MG/0.5ML Pen, Inject 7.5 mg into the skin once a week., Disp: 2 mL, Rfl: 2   FLUoxetine  (PROZAC ) 20 MG tablet, Take 1 tablet (20 mg total) by mouth daily. (Patient not taking: Reported on 01/17/2023), Disp: 30 tablet, Rfl: 3   Physical Exam:   Pulse (!) 111   Ht 5' 8 (1.727 m)  Wt 280 lb (127 kg)   SpO2 94%   BMI 42.57 kg/m   Salient findings:  CN II-XII intact  Bilateral EAC clear and TM intact with well pneumatized middle ear spaces Weber 512: mid Rinne 512: AC > BC b/l  No nystagmus; gait normal, head shake negative; D-Hallpike neg Anterior rhinoscopy: Septum intact; bilateral inferior turbinates without significant hypertrophy No lesions of oral cavity/oropharynx No obviously palpable neck masses/lymphadenopathy/thyromegaly No respiratory distress or stridor  Seprately Identifiable Procedures:  None today  Impression & Plans:  Derrick Scott is a 47 y.o. male with:  1. Benign paroxysmal positional vertigo of right ear   2. Vertigo   3. Adrenal adenoma, right    Based on history and physical exam including: Prior imaging and vestibular testing, his symptoms seem to be most consistent with BPPV.  However given his recent diagnosis of adrenal adenoma, I am unable to rule out if there is a blood  pressure component as well.  Do not suspect migraines although he has a history of migraines given his symptom chronicity and given lack of any other symptoms I do not think that this would be Mnire's disease.  He has not had an audiogram. We discussed options and after discussion of risks and benefits, we will send him again to vestibular rehab when he is having symptoms.  Given upcoming adrenalectomy, we will also hold off on any other further testing until this issue is resolved. Has already seen neuro prior   - Ref to Vest rehab - F/u 3 months with audio same day - after adrenal nodule removed - Can do Epley PRN; given length of episodes, do not think Meclizine  will help - discontinue meclizine   See below regarding exact medications prescribed this encounter including dosages and route: No orders of the defined types were placed in this encounter.     Thank you for allowing me the opportunity to care for your patient. Please do not hesitate to contact me should you have any other questions.  Sincerely, Eldora Blanch, MD Otolarynoglogist (ENT), [facility] Phone: [phone] Fax: [phone]  01/28/2023, 5:51 PM   MDM:  Level 4 Complexity/Problems addressed: mod - chronic problem, worsening in frequency Data complexity: mod - independent review of notes, labs; independent review of CT and MRI imaging - Morbidity: mod - Prescription Drug prescribed or managed: yes - d/c meclizine 

## 2023-01-20 DIAGNOSIS — T1502XA Foreign body in cornea, left eye, initial encounter: Secondary | ICD-10-CM | POA: Diagnosis not present

## 2023-01-24 DIAGNOSIS — Z01818 Encounter for other preprocedural examination: Secondary | ICD-10-CM | POA: Diagnosis not present

## 2023-01-24 DIAGNOSIS — D3501 Benign neoplasm of right adrenal gland: Secondary | ICD-10-CM | POA: Diagnosis not present

## 2023-01-24 DIAGNOSIS — E279 Disorder of adrenal gland, unspecified: Secondary | ICD-10-CM | POA: Diagnosis not present

## 2023-01-24 DIAGNOSIS — I152 Hypertension secondary to endocrine disorders: Secondary | ICD-10-CM | POA: Diagnosis not present

## 2023-01-25 DIAGNOSIS — G4733 Obstructive sleep apnea (adult) (pediatric): Secondary | ICD-10-CM | POA: Diagnosis not present

## 2023-01-26 ENCOUNTER — Institutional Professional Consult (permissible substitution) (INDEPENDENT_AMBULATORY_CARE_PROVIDER_SITE_OTHER): Payer: BC Managed Care – PPO

## 2023-01-26 LAB — HM DIABETES EYE EXAM

## 2023-01-31 ENCOUNTER — Encounter: Payer: Self-pay | Admitting: Family Medicine

## 2023-01-31 ENCOUNTER — Other Ambulatory Visit (HOSPITAL_COMMUNITY): Payer: Self-pay

## 2023-01-31 ENCOUNTER — Other Ambulatory Visit: Payer: Self-pay | Admitting: Family Medicine

## 2023-01-31 MED ORDER — ATORVASTATIN CALCIUM 40 MG PO TABS: 40.0000 mg | ORAL_TABLET | Freq: Every day | ORAL | 3 refills | Status: AC

## 2023-01-31 MED ORDER — TIRZEPATIDE 7.5 MG/0.5ML ~~LOC~~ SOAJ: 7.5000 mg | SUBCUTANEOUS | 2 refills | Status: DC

## 2023-02-22 ENCOUNTER — Ambulatory Visit (INDEPENDENT_AMBULATORY_CARE_PROVIDER_SITE_OTHER): Payer: BC Managed Care – PPO | Admitting: Family Medicine

## 2023-02-22 VITALS — BP 110/82 | HR 97 | Ht 68.0 in | Wt 288.8 lb

## 2023-02-22 DIAGNOSIS — R42 Dizziness and giddiness: Secondary | ICD-10-CM | POA: Diagnosis not present

## 2023-02-22 DIAGNOSIS — E1165 Type 2 diabetes mellitus with hyperglycemia: Secondary | ICD-10-CM

## 2023-02-22 DIAGNOSIS — I10 Essential (primary) hypertension: Secondary | ICD-10-CM | POA: Diagnosis not present

## 2023-02-22 DIAGNOSIS — G43009 Migraine without aura, not intractable, without status migrainosus: Secondary | ICD-10-CM | POA: Diagnosis not present

## 2023-02-22 DIAGNOSIS — D3500 Benign neoplasm of unspecified adrenal gland: Secondary | ICD-10-CM | POA: Diagnosis not present

## 2023-02-22 DIAGNOSIS — Z7985 Long-term (current) use of injectable non-insulin antidiabetic drugs: Secondary | ICD-10-CM

## 2023-02-22 NOTE — Assessment & Plan Note (Signed)
 Was going get blood work today but the lab had already closed.  He will come back tomorrow for A1c, lipid panel, CMP.

## 2023-02-22 NOTE — Assessment & Plan Note (Signed)
 At this point his vertigo may also be associated with his pheochromocytoma.  Will continue conservative management and evaluate further after his adrenalectomy in April.

## 2023-02-22 NOTE — Assessment & Plan Note (Addendum)
 Surgical removal of what they now think is a pheochromocytoma of the right adrenal gland on April 25.  The surgeon has started him on doxazosin.  He will need to be at 2 weeks prior to surgery and for probably 2 weeks afterwards.  It will be robotic assisted surgery. I again discussed with him side effects of the medication he is going to be starting, specifically lightheadedness.  As he was already having some issues with dizziness he needs to be very careful on the job with fluid hydration and not get dry. Also discussed pheochromocytoma in general terms.

## 2023-02-22 NOTE — Progress Notes (Signed)
    CHIEF COMPLAINT / HPI: Follow-up of migraines, lightheadedness, adrenal mass being followed at [facility] now diagnosed as pheochromocytoma. 1.  Pheochromocytoma scheduled for adrenalectomy on the right side in April this year at [facility].  He has started doxazosin and notices that his urine is slightly darker and he is having a little bit more problems with lightheadedness 2.  Wants to get his A1c checked today and some other blood work.  Wonders if he needs to increase his metformin  level.  He has not been checking them at home. 3.  Migraines: Still having about the same frequency. 4.  Injured his knee at work last week.  He is having some knee pain but still able to do his job.  He has an appointment with orthopedics/sports medicine tomorrow.  PERTINENT  PMH / PSH: I have reviewed the patient's medications, allergies, past medical and surgical history, smoking status and updated in the EMR as appropriate.   OBJECTIVE:  BP 110/82   Pulse 97   Ht 5' 8 (1.727 m)   Wt 288 lb 12.8 oz (131 kg)   SpO2 100%   BMI 43.91 kg/m  GENERAL: Well-developed no acute distress PSYCH: AxOx4. Good eye contact.. No psychomotor retardation or agitation. Appropriate speech fluency and content. Asks and answers questions appropriately. Mood is congruent.   ASSESSMENT / PLAN:   Pheochromocytoma Surgical removal of what they now think is a pheochromocytoma of the right adrenal gland on April 25.  The surgeon has started him on doxazosin.  He will need to be at 2 weeks prior to surgery and for probably 2 weeks afterwards.  It will be robotic assisted surgery. I again discussed with him side effects of the medication he is going to be starting, specifically lightheadedness.  As he was already having some issues with dizziness he needs to be very careful on the job with fluid hydration and not get dry. Also discussed pheochromocytoma in general terms.  Migraine without aura and without status migrainosus, not  intractable Right now his migraines are stable.  I do wonder if there is a contribution from the likely pheochromocytoma they have now diagnosed.  Vertigo At this point his vertigo may also be associated with his pheochromocytoma.  Will continue conservative management and evaluate further after his adrenalectomy in April.  Hypertension Has started on alpha blockade through his surgeon.  We discussed absolute importance of the way he is got to check his orthostatics in the a.m. and p.m.  He has been very religious with that and sending the numbers to his surgeon.  Blood pressure looks great today.  DM (diabetes mellitus) (HCC) Was going get blood work today but the lab had already closed.  He will come back tomorrow for A1c, lipid panel, CMP.   Camie Mulch MD

## 2023-02-22 NOTE — Assessment & Plan Note (Signed)
 Has started on alpha blockade through his surgeon.  We discussed absolute importance of the way he is got to check his orthostatics in the a.m. and p.m.  He has been very religious with that and sending the numbers to his surgeon.  Blood pressure looks great today.

## 2023-02-22 NOTE — Assessment & Plan Note (Signed)
 Right now his migraines are stable.  I do wonder if there is a contribution from the likely pheochromocytoma they have now diagnosed.

## 2023-02-23 ENCOUNTER — Other Ambulatory Visit: Payer: BC Managed Care – PPO

## 2023-02-23 ENCOUNTER — Ambulatory Visit: Payer: BC Managed Care – PPO | Admitting: Family Medicine

## 2023-02-23 DIAGNOSIS — I10 Essential (primary) hypertension: Secondary | ICD-10-CM

## 2023-02-23 DIAGNOSIS — E1165 Type 2 diabetes mellitus with hyperglycemia: Secondary | ICD-10-CM | POA: Diagnosis not present

## 2023-02-23 NOTE — Addendum Note (Signed)
 Addended by: Angelita Kendall on: 02/23/2023 07:52 AM   Modules accepted: Orders

## 2023-02-24 ENCOUNTER — Encounter: Payer: Self-pay | Admitting: Family Medicine

## 2023-02-24 LAB — COMPREHENSIVE METABOLIC PANEL
ALT: 30 [IU]/L (ref 0–44)
AST: 25 [IU]/L (ref 0–40)
Albumin: 4.3 g/dL (ref 4.1–5.1)
Alkaline Phosphatase: 90 [IU]/L (ref 44–121)
BUN/Creatinine Ratio: 7 — ABNORMAL LOW (ref 9–20)
BUN: 9 mg/dL (ref 6–24)
Bilirubin Total: 0.3 mg/dL (ref 0.0–1.2)
CO2: 23 mmol/L (ref 20–29)
Calcium: 9.2 mg/dL (ref 8.7–10.2)
Chloride: 103 mmol/L (ref 96–106)
Creatinine, Ser: 1.23 mg/dL (ref 0.76–1.27)
Globulin, Total: 2.9 g/dL (ref 1.5–4.5)
Glucose: 89 mg/dL (ref 70–99)
Potassium: 4.3 mmol/L (ref 3.5–5.2)
Sodium: 142 mmol/L (ref 134–144)
Total Protein: 7.2 g/dL (ref 6.0–8.5)
eGFR: 72 mL/min/{1.73_m2} (ref >59)

## 2023-02-24 LAB — LIPID PANEL
Chol/HDL Ratio: 3.2 {ratio} (ref 0.0–5.0)
Cholesterol, Total: 126 mg/dL (ref 100–199)
HDL: 39 mg/dL — ABNORMAL LOW (ref >39)
LDL Chol Calc (NIH): 66 mg/dL (ref 0–99)
Triglycerides: 118 mg/dL (ref 0–149)
VLDL Cholesterol Cal: 21 mg/dL (ref 5–40)

## 2023-02-24 LAB — HEMOGLOBIN A1C
Est. average glucose Bld gHb Est-mCnc: 128 mg/dL
Hgb A1c MFr Bld: 6.1 % — ABNORMAL HIGH (ref 4.8–5.6)

## 2023-02-25 DIAGNOSIS — G4733 Obstructive sleep apnea (adult) (pediatric): Secondary | ICD-10-CM | POA: Diagnosis not present

## 2023-03-10 ENCOUNTER — Ambulatory Visit (INDEPENDENT_AMBULATORY_CARE_PROVIDER_SITE_OTHER): Payer: BC Managed Care – PPO | Admitting: Family Medicine

## 2023-03-10 ENCOUNTER — Encounter: Payer: Self-pay | Admitting: Family Medicine

## 2023-03-10 ENCOUNTER — Other Ambulatory Visit: Payer: Self-pay

## 2023-03-10 VITALS — Ht 68.0 in | Wt 274.0 lb

## 2023-03-10 DIAGNOSIS — L989 Disorder of the skin and subcutaneous tissue, unspecified: Secondary | ICD-10-CM

## 2023-03-10 DIAGNOSIS — M25561 Pain in right knee: Secondary | ICD-10-CM

## 2023-03-12 NOTE — Progress Notes (Signed)
 Derrick Scott - 48 y.o. male MRN [id number]  Date of birth: 1975/06/19    SUBJECTIVE:      Chief Complaint:/ HPI:  Right knee pain after injury on January 31.  He has seen orthopedist and they have him scheduled for MRI but he is having significant pain.  They have kept him out of work.  They would agree to him doing half-time work with his job does not offer that so he is effectively out of work right now.  Wonders if he can get additional opinions today on whether or not he should just go back to work ignore the pain or stay out and follow through with the plan.  His MRI is not for another 2-1/2 weeks.  Finances are suffering from his inability to work full-time. 2.  He also has upcoming surgery for an adrenal mass that is thought to be pheochromocytoma.  He knows he will be out of work for an extended period with that surgery which is also worrying him regarding finances. 3.  New lesions on his right lower leg.  He thinks they have been there about 4 to 6 weeks.  Not itchy.  No lesions anywhere else.  He is concerned.    OBJECTIVE: Ht 5\' 8"  (1.727 m)   Wt 274 lb (124.3 kg)   BMI 41.66 kg/m   Physical Exam:  Vital signs are reviewed. GENERAL: Well-developed male no acute distress KNEE: Right.  Very slight possible effusion on examination.  Thessaly is positive.  Some medial joint line tenderness.  Full range of motion flexion extension.  Ligamentously intact to varus and valgus stress.  Normal Lachman.  Distally he is neuro vastly intact. SKIN: Right anterior lower extremity reveals 2 well-circumscribed flattened macules that are hyperpigmented.  See media tab for photographs   ULTRASOUND of the right knee: There is no effusion, no fluid noted in the suprapatellar pouch.  The quadriceps tendon is intact without defect.  The patellar tendon is intact without defect.  The edge of the lateral meniscus reveals itself to be slightly protruded but generally intact.  There are osteophytes  noted at the upper and lower joint line.  Medial meniscus appears somewhat fragmented.  It is also protruded.  There are osteophytes on the upper and lower joint line. Findings: Osteoarthritic spurring at the joint line.  Abnormality of the medial meniscus consistent with possible tear.  ASSESSMENT & PLAN: #1.  Knee pain: I urged him to continue with the follow-up with the orthopedist.  That suspect he has meniscal injury.  Would not inject today as that might delay any potential surgical procedure.  He has improved since his original injury but he is not back to baseline. 2.  Skin lesion: Could be dermal lesion secondary to his diabetes.  Potentially could also be related to his potential pheochromocytoma.  He has upcoming surgical intervention for that.  Since it is asymptomatic, for now we will watch and I will follow-up in 4 to 6 weeks when his schedule is a little less intense.  Would consider biopsy.  Could try some topical steroid to see if it decrease the hyperpigmentation but since he is asymptomatic, I would defer that for now.  3.  STRESSORS:Upcoming surgical intervention for adrenal mass thought to be pheochromocytoma: We discussed importance of continuing with the planned surgical intervention for that as well as issues with his knee.  Understand his financial constraints.  See problem based charting & AVS for pt instructions. No problem-specific Assessment &  Plan notes found for this encounter.

## 2023-03-25 DIAGNOSIS — G4733 Obstructive sleep apnea (adult) (pediatric): Secondary | ICD-10-CM | POA: Diagnosis not present

## 2023-03-31 ENCOUNTER — Encounter: Payer: Self-pay | Admitting: Family Medicine

## 2023-04-06 ENCOUNTER — Ambulatory Visit: Payer: BC Managed Care – PPO | Admitting: Nurse Practitioner

## 2023-04-14 NOTE — Telephone Encounter (Signed)
 Opened in error

## 2023-04-17 ENCOUNTER — Ambulatory Visit (INDEPENDENT_AMBULATORY_CARE_PROVIDER_SITE_OTHER): Payer: BC Managed Care – PPO

## 2023-04-17 ENCOUNTER — Ambulatory Visit (INDEPENDENT_AMBULATORY_CARE_PROVIDER_SITE_OTHER): Payer: BC Managed Care – PPO | Admitting: Audiology

## 2023-04-24 DIAGNOSIS — Z01818 Encounter for other preprocedural examination: Secondary | ICD-10-CM | POA: Diagnosis not present

## 2023-04-24 DIAGNOSIS — E1169 Type 2 diabetes mellitus with other specified complication: Secondary | ICD-10-CM | POA: Diagnosis not present

## 2023-04-24 DIAGNOSIS — Z947 Corneal transplant status: Secondary | ICD-10-CM | POA: Diagnosis not present

## 2023-04-24 DIAGNOSIS — E279 Disorder of adrenal gland, unspecified: Secondary | ICD-10-CM | POA: Diagnosis not present

## 2023-04-24 DIAGNOSIS — I152 Hypertension secondary to endocrine disorders: Secondary | ICD-10-CM | POA: Diagnosis not present

## 2023-04-24 DIAGNOSIS — G4733 Obstructive sleep apnea (adult) (pediatric): Secondary | ICD-10-CM | POA: Diagnosis not present

## 2023-04-24 DIAGNOSIS — D3501 Benign neoplasm of right adrenal gland: Secondary | ICD-10-CM | POA: Diagnosis not present

## 2023-04-24 DIAGNOSIS — G43009 Migraine without aura, not intractable, without status migrainosus: Secondary | ICD-10-CM | POA: Diagnosis not present

## 2023-04-24 DIAGNOSIS — E785 Hyperlipidemia, unspecified: Secondary | ICD-10-CM | POA: Diagnosis not present

## 2023-04-24 DIAGNOSIS — Z794 Long term (current) use of insulin: Secondary | ICD-10-CM | POA: Diagnosis not present

## 2023-04-24 DIAGNOSIS — E119 Type 2 diabetes mellitus without complications: Secondary | ICD-10-CM | POA: Diagnosis not present

## 2023-04-24 DIAGNOSIS — Z6841 Body Mass Index (BMI) 40.0 and over, adult: Secondary | ICD-10-CM | POA: Diagnosis not present

## 2023-04-25 ENCOUNTER — Encounter: Payer: Self-pay | Admitting: Family Medicine

## 2023-04-25 DIAGNOSIS — E1165 Type 2 diabetes mellitus with hyperglycemia: Secondary | ICD-10-CM

## 2023-04-25 MED ORDER — OMEPRAZOLE 40 MG PO CPDR: 40.0000 mg | DELAYED_RELEASE_CAPSULE | Freq: Two times a day (BID) | ORAL | 1 refills | Status: DC

## 2023-04-25 MED ORDER — METFORMIN HCL 500 MG PO TABS: 250.0000 mg | ORAL_TABLET | Freq: Two times a day (BID) | ORAL | 3 refills | Status: AC

## 2023-05-05 ENCOUNTER — Ambulatory Visit (INDEPENDENT_AMBULATORY_CARE_PROVIDER_SITE_OTHER): Admitting: Audiology

## 2023-05-05 ENCOUNTER — Ambulatory Visit (INDEPENDENT_AMBULATORY_CARE_PROVIDER_SITE_OTHER)

## 2023-05-10 DIAGNOSIS — I129 Hypertensive chronic kidney disease with stage 1 through stage 4 chronic kidney disease, or unspecified chronic kidney disease: Secondary | ICD-10-CM | POA: Diagnosis not present

## 2023-05-10 DIAGNOSIS — D3500 Benign neoplasm of unspecified adrenal gland: Secondary | ICD-10-CM | POA: Diagnosis not present

## 2023-05-10 DIAGNOSIS — R918 Other nonspecific abnormal finding of lung field: Secondary | ICD-10-CM | POA: Diagnosis not present

## 2023-05-10 DIAGNOSIS — Z452 Encounter for adjustment and management of vascular access device: Secondary | ICD-10-CM | POA: Diagnosis not present

## 2023-05-10 DIAGNOSIS — N189 Chronic kidney disease, unspecified: Secondary | ICD-10-CM | POA: Diagnosis not present

## 2023-05-10 DIAGNOSIS — D3501 Benign neoplasm of right adrenal gland: Secondary | ICD-10-CM | POA: Diagnosis not present

## 2023-05-10 DIAGNOSIS — Z7984 Long term (current) use of oral hypoglycemic drugs: Secondary | ICD-10-CM | POA: Diagnosis not present

## 2023-05-10 DIAGNOSIS — E2740 Unspecified adrenocortical insufficiency: Secondary | ICD-10-CM | POA: Diagnosis not present

## 2023-05-10 DIAGNOSIS — Z7985 Long-term (current) use of injectable non-insulin antidiabetic drugs: Secondary | ICD-10-CM | POA: Diagnosis not present

## 2023-05-10 DIAGNOSIS — Z91013 Allergy to seafood: Secondary | ICD-10-CM | POA: Diagnosis not present

## 2023-05-10 DIAGNOSIS — E278 Other specified disorders of adrenal gland: Secondary | ICD-10-CM | POA: Diagnosis not present

## 2023-05-10 DIAGNOSIS — E1122 Type 2 diabetes mellitus with diabetic chronic kidney disease: Secondary | ICD-10-CM | POA: Diagnosis not present

## 2023-05-10 DIAGNOSIS — R931 Abnormal findings on diagnostic imaging of heart and coronary circulation: Secondary | ICD-10-CM | POA: Diagnosis not present

## 2023-05-10 DIAGNOSIS — E785 Hyperlipidemia, unspecified: Secondary | ICD-10-CM | POA: Diagnosis not present

## 2023-05-10 DIAGNOSIS — Z79899 Other long term (current) drug therapy: Secondary | ICD-10-CM | POA: Diagnosis not present

## 2023-05-10 DIAGNOSIS — I152 Hypertension secondary to endocrine disorders: Secondary | ICD-10-CM | POA: Diagnosis not present

## 2023-05-11 DIAGNOSIS — I152 Hypertension secondary to endocrine disorders: Secondary | ICD-10-CM | POA: Diagnosis not present

## 2023-05-12 DIAGNOSIS — R931 Abnormal findings on diagnostic imaging of heart and coronary circulation: Secondary | ICD-10-CM | POA: Diagnosis not present

## 2023-05-12 DIAGNOSIS — R918 Other nonspecific abnormal finding of lung field: Secondary | ICD-10-CM | POA: Diagnosis not present

## 2023-05-12 DIAGNOSIS — E278 Other specified disorders of adrenal gland: Secondary | ICD-10-CM | POA: Diagnosis not present

## 2023-05-12 DIAGNOSIS — D3501 Benign neoplasm of right adrenal gland: Secondary | ICD-10-CM | POA: Diagnosis not present

## 2023-05-12 DIAGNOSIS — Z452 Encounter for adjustment and management of vascular access device: Secondary | ICD-10-CM | POA: Diagnosis not present

## 2023-05-15 DIAGNOSIS — D3501 Benign neoplasm of right adrenal gland: Secondary | ICD-10-CM | POA: Diagnosis not present

## 2023-05-16 ENCOUNTER — Telehealth: Payer: Self-pay

## 2023-05-16 DIAGNOSIS — D3501 Benign neoplasm of right adrenal gland: Secondary | ICD-10-CM

## 2023-05-16 NOTE — Transitions of Care (Post Inpatient/ED Visit) (Signed)
 05/16/2023  Name: Derrick Scott MRN: [id number] DOB: [birth year]  Today's TOC FU Call Status: Today's TOC FU Call Status:: Successful TOC FU Call Completed TOC FU Call Complete Date: 05/16/23 Patient's Name and Date of Birth confirmed.  Transition Care Management Follow-up Telephone Call Date of Discharge: 05/15/23 Discharge Facility: Other ([facility] Facility) Name of Other ([facility]) Discharge Facility: [facility] Type of Discharge: Inpatient Admission Primary Inpatient Discharge Diagnosis:: Pheochromocytoma of right adrenal gland - Post- Robotic Assisted Laparoscopic Adrenalectomy, or Laparoscopic Adrenalectomy How have you been since you were released from the hospital?: Worse (Patient reports surgical pain is causing him to feel worse) Any questions or concerns?: Yes Patient Questions/Concerns:: Patient rates lower right side of abdomen 9.5/10 Patient Questions/Concerns Addressed: Notified Provider of Patient Questions/Concerns  Items Reviewed: Did you receive and understand the discharge instructions provided?: Yes Medications obtained,verified, and reconciled?: Yes (Medications Reviewed) Any new allergies since your discharge?: No Dietary orders reviewed?: Yes Type of Diet Ordered:: patient follows low carb diet Do you have support at home?: Yes People in Home [RPT]: spouse Name of Support/Comfort Primary Source: Wife, Marlynn Singer helps as needed  Medications Reviewed Today: Medications Reviewed Today     Reviewed by Sharmaine Dearth, RN (Registered Nurse) on 05/16/23 at 1323  Med List Status: <None>   Medication Order Taking? Sig Documenting Provider Last Dose Status Informant  acetaminophen  (TYLENOL ) 325 MG tablet [id number] Yes Take 325 mg by mouth every 8 (eight) hours as needed for moderate pain (pain score 4-6). [provider] Taking Active   atorvastatin  (LIPITOR) 40 MG tablet [id number] Yes Take 1 tablet (40 mg total) by mouth daily. Charise Companion, MD Taking Active   cetirizine  (ZYRTEC ) 10 MG tablet [id number] No Take 1 tablet (10 mg total) by mouth daily.  Patient not taking: Reported on 05/16/2023   Deatra Face, MD Not Taking Active   cyclobenzaprine  (FLEXERIL ) 10 MG tablet [id number] Yes Take 10 mg by mouth 3 (three) times daily. [provider] Taking Active   doxazosin (CARDURA) 1 MG tablet [id number] No Take 1 mg by mouth 2 (two) times daily.  Patient not taking: Reported on 05/16/2023   [provider] Not Taking Consider Medication Status and Discontinue (No longer needed (for PRN medications))   gabapentin (NEURONTIN) 300 MG capsule [id number] Yes Take 300 mg by mouth 3 (three) times daily. [provider] Taking Active   hydrocortisone (CORTEF) 5 MG tablet [id number] Yes Take 5 mg by mouth See admin instructions. TAKE 4 TABLETS (20 MG) AT 8 AM AND 2 TABLET (10 MG) AT 3 PM [provider] Taking Active   losartan  (COZAAR ) 100 MG tablet [id number] Yes Take 1 tablet (100 mg total) by mouth daily.  Patient taking differently: Take 50 mg by mouth daily. Decreased to 50mg  per hospital discharge 05/15/23   Charise Companion, MD Taking Active   meclizine  (ANTIVERT ) 25 MG tablet [id number] No Take 1 tablet (25 mg total) by mouth 3 (three) times daily as needed for dizziness (do not use for more than 3days).  Patient not taking: Reported on 05/16/2023   Kandace Organ, NP Not Taking Active   meloxicam  (MOBIC ) 15 MG tablet [id number] Yes Take 1 tablet by mouth daily. [provider] Taking Active            Med Note Bernadene Brewer, Lorrin Bodner A   Tue May 16, 2023  1:17 PM) Patient reports he has not needed  metFORMIN  (GLUCOPHAGE ) 500 MG tablet [id number] Yes  Take 0.5 tablets (250 mg total) by mouth 2 (two) times daily with a meal. Azell Boll, MD Taking Active   Multiple Vitamins-Minerals (ONE-A-DAY MENS HEALTH FORMULA PO) [id number] Yes Take 1 tablet by mouth daily. [provider] Taking Active             Med Note Bernadene Brewer, Toya Palacios A   Tue May 16, 2023  1:22 PM) Per discharge instruction hold for 1 week after surgery  omeprazole  (PRILOSEC) 40 MG capsule [id number] Yes Take 1 capsule (40 mg total) by mouth 2 (two) times daily. Azell Boll, MD Taking Active   oxycodone  (OXY-IR) 5 MG capsule [id number] Yes Take 10 mg by mouth every 4 (four) hours as needed for pain. [provider] Taking Active   psyllium (METAMUCIL) 58.6 % packet [id number] Yes Take 1 packet by mouth daily. [provider] Taking Active   rizatriptan  (MAXALT ) 5 MG tablet [id number] Yes Take 1-2 tablets (5-10 mg total) by mouth as needed for migraine. May repeat in 2 hours if needed. No more than 30mg  in 24hrs Nche, Connye Delaine, NP Taking Active            Med Note [facility], Awab Abebe A   Tue May 16, 2023  1:17 PM) Hasn't needed per patient  senna (SENOKOT) 8.6 MG tablet [id number] Yes Take 2 tablets by mouth 2 (two) times daily. Wife will pick this up today 05/16/23 [provider] Taking Active   tirzepatide  (MOUNJARO ) 7.5 MG/0.5ML Pen [id number] Yes Inject 7.5 mg into the skin once a week. Charise Companion, MD Taking Active             Home Care and Equipment/Supplies: Were Home Health Services Ordered?: No Any new equipment or medical supplies ordered?: No  Functional Questionnaire: Do you need assistance with bathing/showering or dressing?: Yes (Wife assists) Do you need assistance with meal preparation?: Yes (wife assists) Do you need assistance with eating?: No Do you have difficulty maintaining continence: No Do you need assistance with getting out of bed/getting out of a chair/moving?: Yes (patient states wife is helping due to pain when changes position) Do you have difficulty managing or taking your medications?: No  Follow up appointments reviewed: PCP Follow-up appointment confirmed?: Yes Date of PCP follow-up appointment?: 05/19/23 (Called with patient to get hospital follow up  scheduled with Sarahann Cumins) Follow-up Provider: PCP office Specialist Hospital Follow-up appointment confirmed?: Yes Date of Specialist follow-up appointment?: 05/30/23 Follow-Up Specialty Provider:: Post op appointment Dr Dwan Girt Stang Do you need transportation to your follow-up appointment?: No Do you understand care options if your condition(s) worsen?: Yes-patient verbalized understanding  SDOH Interventions Today    Flowsheet Row Most Recent Value  SDOH Interventions   Food Insecurity Interventions Intervention Not Indicated  Housing Interventions AMB Referral  Transportation Interventions Intervention Not Indicated  Utilities Interventions Intervention Not Indicated       Goals Addressed             This Visit's Progress    VBCI Transitions of Care (TOC) Care Plan       Problems:  Recent Hospitalization for treatment of Pheochromocytoma of right adrenal gland - Post- Robotic Assisted Laparoscopic Adrenalectomy, or Laparoscopic Adrenalectomy SDOH barrier: Patient has been out of work related to injury and has been receiving worker's comp - now out of work related to surgery - Patient agreeable to SW referral related concern about finances/rent  Goal:  Over the next 30 days, the patient will not experience  hospital readmission  Interventions:  Transitions of Care: Doctor Visits  - discussed the importance of doctor visits Contacted provider for patient needs Called office of PCP - appointment scheduled with Sarahann Cumins DO due to Violetta Grice with no available appointment until 06/21/23 Post-op wound/incision care reviewed with patient/caregiver Reviewed Signs and symptoms of infection  Surgery (Robotic Assisted Laparoscopic Adrenalectom): Evaluation of current treatment plan related to Patient reports post operative pain and understand to call surgeon if pain is unmanagement reviewed post-operative instructions with patient/caregiver reviewed medications  with patient and addressed questions reviewed scheduled provider appointments with patients: surgeon, Dr Daine Drummer Stang appointment 05/30/23 confirmed availability of transportation to all appointments Patient states his wife can drive him as needed  Patient Self Care Activities:  Attend all scheduled provider appointments Call pharmacy for medication refills 3-7 days in advance of running out of medications Call provider office for new concerns or questions  Notify RN Care Manager of [facility] call rescheduling needs Participate in Transition of Care Program/Attend [facility] scheduled calls Take medications as prescribed    Plan:  Next PCP appointment scheduled for: 05/19/23 Telephone follow up appointment with care management team member scheduled for:  05/23/23 3pm The patient has been provided with contact information for the care management team and has been advised to call with any health related questions or concerns.          Tonia Frankel RN, CCM [place]  VBCI-Population Health RN Care Manager [phone]

## 2023-05-16 NOTE — Patient Instructions (Signed)
 Visit Information  Thank you for taking time to visit with me today. Please don't hesitate to contact me if I can be of assistance to you before our next scheduled telephone appointment.  Our next appointment is by telephone on 05/23/23 at 3pm  Following is a copy of your care plan:   Goals Addressed             This Visit's Progress    VBCI Transitions of Care (TOC) Care Plan       Problems:  Recent Hospitalization for treatment of Pheochromocytoma of right adrenal gland - Post- Robotic Assisted Laparoscopic Adrenalectomy, or Laparoscopic Adrenalectomy SDOH barrier: Patient has been out of work related to injury and has been receiving worker's comp - now out of work related to surgery - Patient agreeable to SW referral related concern about finances/rent  Goal:  Over the next 30 days, the patient will not experience hospital readmission  Interventions:  Transitions of Care: Doctor Visits  - discussed the importance of doctor visits Contacted provider for patient needs Called office of PCP - appointment scheduled with Sarahann Cumins DO due to Violetta Grice with no available appointment until 06/21/23 Post-op wound/incision care reviewed with patient/caregiver Reviewed Signs and symptoms of infection  Surgery (Robotic Assisted Laparoscopic Adrenalectom): Evaluation of current treatment plan related to Patient reports post operative pain and understand to call surgeon if pain is unmanagement reviewed post-operative instructions with patient/caregiver reviewed medications with patient and addressed questions reviewed scheduled provider appointments with patients: surgeon, Dr Daine Drummer Stang appointment 05/30/23 confirmed availability of transportation to all appointments Patient states his wife can drive him as needed  Patient Self Care Activities:  Attend all scheduled provider appointments Call pharmacy for medication refills 3-7 days in advance of running out of medications Call  provider office for new concerns or questions  Notify RN Care Manager of [facility] call rescheduling needs Participate in Transition of Care Program/Attend [facility] scheduled calls Take medications as prescribed    Plan:  Next PCP appointment scheduled for: 05/19/23 Telephone follow up appointment with care management team member scheduled for:  05/23/23 3pm The patient has been provided with contact information for the care management team and has been advised to call with any health related questions or concerns.         Patient verbalizes understanding of instructions and care plan provided today and agrees to view in MyChart. Active MyChart status and patient understanding of how to access instructions and care plan via MyChart confirmed with patient.     Telephone follow up appointment with care management team member scheduled for: 05/23/23 3pm The patient has been provided with contact information for the care management team and has been advised to call with any health related questions or concerns.    Please call the care guide team at [phone] if you need to cancel or reschedule your appointment.   Please call the Suicide and Crisis Lifeline: 988 call 1-800-273-TALK (toll free, 24 hour hotline) call 911 if you are experiencing a Mental Health or Behavioral Health Crisis or need someone to talk to.  Tonia Frankel RN, CCM [place]  VBCI-Population Health RN Care Manager [phone]

## 2023-05-18 NOTE — Progress Notes (Deleted)
    SUBJECTIVE:   CHIEF COMPLAINT / HPI:   Dx with pheochromocytoma - Patient was pre-admitted on 4/23 for pre-op hydration and blood pressure optimization. He underwent RIGHT robot-assisted adrenalectomy on 05/12/23. Post-op course was notable for adrenal insufficiency. He was hemodynamically stable the entire post-operative course but did require additional management by endocrinology. He was started on hydrocortisone. He was tolerating diet, ambulating, voiding, and having bowel function on the day of discharge. Stable for discharge home on 05/15/23.   Appt with surgeon dr. Gaynell Keeler 05/30/23  PERTINENT  PMH / PSH: ***  OBJECTIVE:   There were no vitals taken for this visit.  ***  ASSESSMENT/PLAN:   Assessment & Plan      Sarahann Cumins, DO [facility]

## 2023-05-19 ENCOUNTER — Ambulatory Visit: Admitting: Family Medicine

## 2023-05-21 ENCOUNTER — Encounter (HOSPITAL_BASED_OUTPATIENT_CLINIC_OR_DEPARTMENT_OTHER): Payer: Self-pay | Admitting: Emergency Medicine

## 2023-05-21 ENCOUNTER — Emergency Department (HOSPITAL_BASED_OUTPATIENT_CLINIC_OR_DEPARTMENT_OTHER)
Admission: EM | Admit: 2023-05-21 | Discharge: 2023-05-21 | Disposition: A | Discharge status: Home / Self Care | Attending: Emergency Medicine | Admitting: Emergency Medicine

## 2023-05-21 ENCOUNTER — Other Ambulatory Visit: Payer: Self-pay

## 2023-05-21 DIAGNOSIS — Z7984 Long term (current) use of oral hypoglycemic drugs: Secondary | ICD-10-CM | POA: Diagnosis not present

## 2023-05-21 DIAGNOSIS — E119 Type 2 diabetes mellitus without complications: Secondary | ICD-10-CM | POA: Diagnosis not present

## 2023-05-21 DIAGNOSIS — J029 Acute pharyngitis, unspecified: Secondary | ICD-10-CM | POA: Insufficient documentation

## 2023-05-21 LAB — RESP PANEL BY RT-PCR (RSV, FLU A&B, COVID)  RVPGX2
Influenza A by PCR: NEGATIVE
Influenza B by PCR: NEGATIVE
Resp Syncytial Virus by PCR: NEGATIVE
SARS Coronavirus 2 by RT PCR: NEGATIVE

## 2023-05-21 LAB — GROUP A STREP BY PCR: Group A Strep by PCR: NOT DETECTED

## 2023-05-21 NOTE — Discharge Instructions (Signed)
 COVID flu RSV strep test negative.  Continue Tylenol  ibuprofen  for discomfort.  This is likely allergy type process.  Return if wound to your abdomen gets worse as we discussed.  Follow-up with your surgeons and primary care doctor.

## 2023-05-21 NOTE — ED Provider Notes (Signed)
 [place] [facility] Provider Note   CSN: [id number] Arrival date & time: 05/21/23  1215     History  Chief Complaint  Patient presents with   Wound Dehiscence   Sore Throat    Derrick Scott is a 48 y.o. male.  Sore throat the last couple days.  Nothing makes it worse or better.  History of diabetes history of pheochromocytoma status post removal.  He had some wound dehiscence to the right lower abdominal scar that he put Steri-Strips on.  Not have any fever or drainage chills.  No trouble eating drinking.  He has had some congestion.  Denies any cough sputum production.  The history is provided by the patient.       Home Medications Prior to Admission medications   Medication Sig Start Date End Date Taking? Authorizing Provider  acetaminophen  (TYLENOL ) 325 MG tablet Take 325 mg by mouth every 8 (eight) hours as needed for moderate pain (pain score 4-6).    [provider]  atorvastatin  (LIPITOR) 40 MG tablet Take 1 tablet (40 mg total) by mouth daily. 01/31/23   Charise Companion, MD  cetirizine  (ZYRTEC ) 10 MG tablet Take 1 tablet (10 mg total) by mouth daily. Patient not taking: Reported on 05/16/2023 03/24/22   Deatra Face, MD  cyclobenzaprine  (FLEXERIL ) 10 MG tablet Take 10 mg by mouth 3 (three) times daily. 05/16/23 05/27/23  [provider]  doxazosin (CARDURA) 1 MG tablet Take 1 mg by mouth 2 (two) times daily. Patient not taking: Reported on 05/16/2023    [provider]  gabapentin (NEURONTIN) 300 MG capsule Take 300 mg by mouth 3 (three) times daily. 05/16/23 05/16/24  [provider]  hydrocortisone (CORTEF) 5 MG tablet Take 5 mg by mouth See admin instructions. TAKE 4 TABLETS (20 MG) AT 8 AM AND 2 TABLET (10 MG) AT 3 PM 05/16/23   [provider]  losartan  (COZAAR ) 100 MG tablet Take 1 tablet (100 mg total) by mouth daily. Patient taking differently: Take 50 mg by mouth daily. Decreased to  50mg  per hospital discharge 05/15/23 11/16/22   Charise Companion, MD  meclizine  (ANTIVERT ) 25 MG tablet Take 1 tablet (25 mg total) by mouth 3 (three) times daily as needed for dizziness (do not use for more than 3days). Patient not taking: Reported on 05/16/2023 06/17/22   Nche, Connye Delaine, NP  meloxicam  (MOBIC ) 15 MG tablet Take 1 tablet by mouth daily. 09/29/22   [provider]  metFORMIN  (GLUCOPHAGE ) 500 MG tablet Take 0.5 tablets (250 mg total) by mouth 2 (two) times daily with a meal. 04/25/23   Azell Boll, MD  Multiple Vitamins-Minerals (ONE-A-DAY MENS HEALTH FORMULA PO) Take 1 tablet by mouth daily.    [provider]  omeprazole  (PRILOSEC) 40 MG capsule Take 1 capsule (40 mg total) by mouth 2 (two) times daily. 04/25/23   Azell Boll, MD  oxycodone  (OXY-IR) 5 MG capsule Take 10 mg by mouth every 4 (four) hours as needed for pain. 05/16/23 05/21/23  [provider]  psyllium (METAMUCIL) 58.6 % packet Take 1 packet by mouth daily.    [provider]  rizatriptan  (MAXALT ) 5 MG tablet Take 1-2 tablets (5-10 mg total) by mouth as needed for migraine. May repeat in 2 hours if needed. No more than 30mg  in 24hrs 10/07/22   Nche, Charlotte Lum, NP  senna (SENOKOT) 8.6 MG tablet Take 2 tablets by mouth 2 (two) times daily.  Wife will pick this up today 05/16/23    [provider]  tirzepatide  (MOUNJARO ) 7.5 MG/0.5ML Pen Inject 7.5 mg into the skin once a week. 01/31/23   Charise Companion, MD      Allergies    Shrimp [shellfish allergy]    Review of Systems   Review of Systems  Physical Exam Updated Vital Signs BP 137/81 (BP Location: Right Arm)   Pulse (!) 107   Temp 98.2 F (36.8 C) (Oral)   Resp 14   Ht 5\' 8"  (1.727 m)   Wt 124.3 kg   SpO2 97%   BMI 41.67 kg/m  Physical Exam Vitals and nursing note reviewed.  Constitutional:      General: He is not in acute distress.    Appearance: He is well-developed. He is not ill-appearing.  HENT:      Head: Normocephalic and atraumatic.     Mouth/Throat:     Mouth: Mucous membranes are moist. No oral lesions.     Pharynx: No pharyngeal swelling, oropharyngeal exudate or posterior oropharyngeal erythema.     Tonsils: No tonsillar exudate or tonsillar abscesses.  Eyes:     Conjunctiva/sclera: Conjunctivae normal.  Cardiovascular:     Rate and Rhythm: Normal rate and regular rhythm.     Heart sounds: No murmur heard. Pulmonary:     Effort: Pulmonary effort is normal. No respiratory distress.     Breath sounds: Normal breath sounds.  Abdominal:     Palpations: Abdomen is soft.     Tenderness: There is no abdominal tenderness.  Musculoskeletal:        General: No swelling.     Cervical back: Neck supple.  Skin:    General: Skin is warm and dry.     Capillary Refill: Capillary refill takes less than 2 seconds.     Comments: Surgical spots are clean dry and intact, no tenderness  Neurological:     Mental Status: He is alert.  Psychiatric:        Mood and Affect: Mood normal.     ED Results / Procedures / Treatments   Labs (all labs ordered are listed, but only abnormal results are displayed) Labs Reviewed  RESP PANEL BY RT-PCR (RSV, FLU A&B, COVID)  RVPGX2  GROUP A STREP BY PCR    EKG None  Radiology No results found.  Procedures Procedures    Medications Ordered in ED Medications - No data to display  ED Course/ Medical Decision Making/ A&P                                 Medical Decision Making  Derrick Scott is here with sore throat.  History of diabetes pheochromocytoma.  Just had removal of adrenal gland.  She had a little bit of wound dehiscence to the right lower quadrant that is unremarkable.  They already Steri-Stripped it.  I have no concern for acute process otherwise in this area.  No signs of infection.  Throat exams unremarkable.  Will check for strep COVID flu RSV.  This could be allergy type process.  He has no trismus no drooling.   Dissipate discharge but will await strep test because we would offer him penicillin  shot.  COVID flu RSV test unremarkable.  Strep test negative.  Suspect allergy pollen type process.  Follow-up with primary care doctor.  Follow-up with surgeon.  Return if symptoms worsen.  This chart was dictated  using voice recognition software.  Despite best efforts to proofread,  errors can occur which can change the documentation meaning.         Final Clinical Impression(s) / ED Diagnoses Final diagnoses:  Sore throat    Rx / DC Orders ED Discharge Orders     None         Lowery Rue, DO 05/21/23 1323

## 2023-05-21 NOTE — ED Triage Notes (Addendum)
 Pt had adrenalectomy on 4/25; sts incisions were glued and noticed today that a part of one of the incisions is open; small amt of bright red blood noted through steri strips RLQ incision; pt also c/o sore throat since yesterday

## 2023-05-23 ENCOUNTER — Other Ambulatory Visit: Payer: Self-pay

## 2023-05-23 NOTE — Transitions of Care (Post Inpatient/ED Visit) (Signed)
 Transition of Care week 2  Visit Note  05/23/2023  Name: Derrick Scott MRN: [id number]          DOB: 23-Sep-1975  Situation: Patient enrolled in [facility] 30-day program. Visit completed with patient by telephone.   Background: Patient being followed in [facility] program related hospitalization at [facility] for Pheochromocytoma of right adrenal gland - Post- Robotic Assisted Laparoscopic Adrenalectomy. Patient missed his 05/19/23 PCP appointment and went to ED 05/21/23 for sore throat - testing negative and was told to gargle with warm salt water. Patient states sore throat has resolved and right abdominal iincisions are closed and states he is doing well. Patient has surgeon follow up appointment 05/30/23  Initial Transition Care Management Follow-up Telephone Call    Past Medical History:  Diagnosis Date   Arthritis    knee bilateral   Asthma    as a child "NO attacks"   Diabetes mellitus without complication (HCC)    border line no medicationa   GERD (gastroesophageal reflux disease)    High cholesterol    Medial meniscus tear 01/2012   right knee   Obesity    Pneumonia    Renal disorder    hematuria,kidney stones   Sleep apnea    Vertigo, benign paroxysmal 2015    Assessment: Patient Reported Symptoms: Cognitive Cognitive Status: Alert and oriented to person, place, and time, Normal speech and language skills Cognitive/Intellectual Conditions Management [RPT]: None reported or documented in medical history or problem list      Neurological Neurological Review of Symptoms: No symptoms reported    HEENT HEENT Symptoms Reported: No symptoms reported (patient went to ED 5/4 for sore throat - patient states testing was negative and he gargled with warm salt water and throat is better)      Cardiovascular Cardiovascular Symptoms Reported: No symptoms reported Cardiovascular Conditions: Hypertension Cardiovascular Management Strategies: Medication therapy Weight: 265 lb  (120.2 kg) Cardiovascular Self-Management Outcome: 4 (good)  Respiratory Respiratory Symptoms Reported: No symptoms reported    Endocrine Patient reports the following symptoms related to hypoglycemia or hyperglycemia : No symptoms reported Is patient diabetic?: Yes Is patient checking blood sugars at home?: No    Gastrointestinal Gastrointestinal Symptoms Reported: Abdominal pain or discomfort Additional Gastrointestinal Details: patient reports some discomfort from surgery - states he hasn't needed pain medications      Genitourinary Genitourinary Symptoms Reported: No symptoms reported    Integumentary Integumentary Symptoms Reported: Incision Additional Integumentary Details: Patient reports all 5 incisions are closed and healed with no signs of infection - patient states the longer incision has steri strips and gauze with waterproof dressing Skin Self-Management Outcome: 4 (good) Skin Comment: Patient denies any drainage  Musculoskeletal Musculoskelatal Symptoms Reviewed: No symptoms reported        Psychosocial           There were no vitals filed for this visit.  Medications Reviewed Today     Reviewed by Sharmaine Dearth, RN (Registered Nurse) on 05/23/23 at 1538  Med List Status: <None>   Medication Order Taking? Sig Documenting Provider Last Dose Status Informant  acetaminophen  (TYLENOL ) 325 MG tablet [id number] Yes Take 325 mg by mouth every 8 (eight) hours as needed for moderate pain (pain score 4-6). [provider] Taking Active   atorvastatin  (LIPITOR) 40 MG tablet [id number] Yes Take 1 tablet (40 mg total) by mouth daily. Charise Companion, MD Taking Active   cetirizine  (ZYRTEC ) 10 MG tablet [id number] No Take 1 tablet (  10 mg total) by mouth daily.  Patient not taking: Reported on 05/23/2023   Deatra Face, MD Not Taking Active   cyclobenzaprine  (FLEXERIL ) 10 MG tablet [id number] Yes Take 10 mg by mouth 3 (three) times daily. [provider] Taking  Active   doxazosin (CARDURA) 1 MG tablet [id number] No Take 1 mg by mouth 2 (two) times daily.  Patient not taking: Reported on 05/23/2023   [provider] Not Taking Consider Medication Status and Discontinue (No longer needed (for PRN medications))   gabapentin (NEURONTIN) 300 MG capsule [id number] Yes Take 300 mg by mouth 3 (three) times daily. [provider] Taking Active   hydrocortisone (CORTEF) 5 MG tablet [id number] Yes Take 5 mg by mouth See admin instructions. TAKE 4 TABLETS (20 MG) AT 8 AM AND 2 TABLET (10 MG) AT 3 PM [provider] Taking Active   losartan  (COZAAR ) 100 MG tablet [id number] Yes Take 1 tablet (100 mg total) by mouth daily.  Patient taking differently: Take 50 mg by mouth daily. Decreased to 50mg  per hospital discharge 05/15/23   Charise Companion, MD Taking Active   meclizine  (ANTIVERT ) 25 MG tablet [id number] No Take 1 tablet (25 mg total) by mouth 3 (three) times daily as needed for dizziness (do not use for more than 3days).  Patient not taking: Reported on 05/23/2023   Nche, Connye Delaine, NP Not Taking Consider Medication Status and Discontinue (No longer needed (for PRN medications))   meloxicam  (MOBIC ) 15 MG tablet [id number] No Take 1 tablet by mouth daily.  Patient not taking: Reported on 05/23/2023   [provider] Not Taking Active            Med Note [facility], Antonia Culbertson A   Tue May 16, 2023  1:17 PM) Patient reports he has not needed  metFORMIN  (GLUCOPHAGE ) 500 MG tablet [id number] Yes Take 0.5 tablets (250 mg total) by mouth 2 (two) times daily with a meal. Azell Boll, MD Taking Active   Multiple Vitamins-Minerals (ONE-A-DAY MENS HEALTH FORMULA PO) [id number] Yes Take 1 tablet by mouth daily. [provider] Taking Active            Med Note [facility] Brewer, Yoceline Bazar A   Tue May 16, 2023  1:22 PM) Per discharge instruction hold for 1 week after surgery  omeprazole  (PRILOSEC) 40 MG capsule [id number] Yes Take 1 capsule (40 mg total) by  mouth 2 (two) times daily. Azell Boll, MD Taking Active   psyllium (METAMUCIL) 58.6 % packet [id number] Yes Take 1 packet by mouth daily. [provider] Taking Active   rizatriptan  (MAXALT ) 5 MG tablet [id number] No Take 1-2 tablets (5-10 mg total) by mouth as needed for migraine. May repeat in 2 hours if needed. No more than 30mg  in 24hrs  Patient not taking: Reported on 05/23/2023   Nche, Connye Delaine, NP Not Taking Active            Med Note [facility], Kerman Pfost A   Tue May 16, 2023  1:17 PM) Hasn't needed per patient  senna (SENOKOT) 8.6 MG tablet [id number] No Take 2 tablets by mouth 2 (two) times daily. Wife will pick this up today 05/16/23  Patient not taking: Reported on 05/23/2023   [provider] Not Taking Active   tirzepatide  (MOUNJARO ) 7.5 MG/0.5ML Pen [id number] Yes Inject 7.5 mg into the skin once a week. Charise Companion, MD Taking Active             Recommendation:  Specialty provider follow-up surgeon, Dr Gaynell Keeler 05/29/13  Follow Up Plan:   Telephone follow up appointment date/time:  05/31/23 1pm  Tonia Frankel RN, CCM [place]  VBC[facility]Population Health RN Care Manager [phone]

## 2023-05-25 DIAGNOSIS — G4733 Obstructive sleep apnea (adult) (pediatric): Secondary | ICD-10-CM | POA: Diagnosis not present

## 2023-05-27 ENCOUNTER — Emergency Department (HOSPITAL_BASED_OUTPATIENT_CLINIC_OR_DEPARTMENT_OTHER)

## 2023-05-27 ENCOUNTER — Encounter (HOSPITAL_BASED_OUTPATIENT_CLINIC_OR_DEPARTMENT_OTHER): Payer: Self-pay | Admitting: Emergency Medicine

## 2023-05-27 ENCOUNTER — Emergency Department (HOSPITAL_BASED_OUTPATIENT_CLINIC_OR_DEPARTMENT_OTHER)
Admission: EM | Admit: 2023-05-27 | Discharge: 2023-05-27 | Disposition: A | Discharge status: Home / Self Care | Attending: Emergency Medicine | Admitting: Emergency Medicine

## 2023-05-27 ENCOUNTER — Other Ambulatory Visit: Payer: Self-pay

## 2023-05-27 DIAGNOSIS — Z79899 Other long term (current) drug therapy: Secondary | ICD-10-CM | POA: Diagnosis not present

## 2023-05-27 DIAGNOSIS — I10 Essential (primary) hypertension: Secondary | ICD-10-CM | POA: Insufficient documentation

## 2023-05-27 DIAGNOSIS — R1011 Right upper quadrant pain: Secondary | ICD-10-CM | POA: Insufficient documentation

## 2023-05-27 DIAGNOSIS — E119 Type 2 diabetes mellitus without complications: Secondary | ICD-10-CM | POA: Insufficient documentation

## 2023-05-27 DIAGNOSIS — M799 Soft tissue disorder, unspecified: Secondary | ICD-10-CM | POA: Diagnosis not present

## 2023-05-27 DIAGNOSIS — J45909 Unspecified asthma, uncomplicated: Secondary | ICD-10-CM | POA: Insufficient documentation

## 2023-05-27 DIAGNOSIS — K5730 Diverticulosis of large intestine without perforation or abscess without bleeding: Secondary | ICD-10-CM | POA: Diagnosis not present

## 2023-05-27 DIAGNOSIS — G8918 Other acute postprocedural pain: Secondary | ICD-10-CM | POA: Diagnosis not present

## 2023-05-27 LAB — CBC WITH DIFFERENTIAL/PLATELET
Abs Immature Granulocytes: 0.05 10*3/uL (ref 0.00–0.07)
Basophils Absolute: 0.1 10*3/uL (ref 0.0–0.1)
Basophils Relative: 1 %
Eosinophils Absolute: 0.1 10*3/uL (ref 0.0–0.5)
Eosinophils Relative: 1 %
HCT: 40.3 % (ref 39.0–52.0)
Hemoglobin: 13.5 g/dL (ref 13.0–17.0)
Immature Granulocytes: 1 %
Lymphocytes Relative: 23 %
Lymphs Abs: 2.3 10*3/uL (ref 0.7–4.0)
MCH: 28.5 pg (ref 26.0–34.0)
MCHC: 33.5 g/dL (ref 30.0–36.0)
MCV: 85 fL (ref 80.0–100.0)
Monocytes Absolute: 0.5 10*3/uL (ref 0.1–1.0)
Monocytes Relative: 5 %
Neutro Abs: 7.3 10*3/uL (ref 1.7–7.7)
Neutrophils Relative %: 69 %
Platelets: 354 10*3/uL (ref 150–400)
RBC: 4.74 MIL/uL (ref 4.22–5.81)
RDW: 13.8 % (ref 11.5–15.5)
WBC: 10.2 10*3/uL (ref 4.0–10.5)
nRBC: 0 % (ref 0.0–0.2)

## 2023-05-27 LAB — COMPREHENSIVE METABOLIC PANEL WITH GFR
ALT: 101 U/L — ABNORMAL HIGH (ref 0–44)
AST: 41 U/L (ref 15–41)
Albumin: 4.1 g/dL (ref 3.5–5.0)
Alkaline Phosphatase: 102 U/L (ref 38–126)
Anion gap: 10 (ref 5–15)
BUN: 8 mg/dL (ref 6–20)
CO2: 24 mmol/L (ref 22–32)
Calcium: 8.9 mg/dL (ref 8.9–10.3)
Chloride: 104 mmol/L (ref 98–111)
Creatinine, Ser: 1.13 mg/dL (ref 0.61–1.24)
GFR, Estimated: >60 mL/min (ref >60)
Glucose, Bld: 104 mg/dL — ABNORMAL HIGH (ref 70–99)
Potassium: 4.3 mmol/L (ref 3.5–5.1)
Sodium: 138 mmol/L (ref 135–145)
Total Bilirubin: <0.2 mg/dL (ref 0.0–1.2)
Total Protein: 7.8 g/dL (ref 6.5–8.1)

## 2023-05-27 LAB — URINALYSIS, ROUTINE W REFLEX MICROSCOPIC
Bilirubin Urine: NEGATIVE
Glucose, UA: NEGATIVE mg/dL
Ketones, ur: NEGATIVE mg/dL
Leukocytes,Ua: NEGATIVE
Nitrite: NEGATIVE
Protein, ur: NEGATIVE mg/dL
Specific Gravity, Urine: 1.025 (ref 1.005–1.030)
pH: 7 (ref 5.0–8.0)

## 2023-05-27 LAB — URINALYSIS, MICROSCOPIC (REFLEX)

## 2023-05-27 LAB — LIPASE, BLOOD: Lipase: 51 U/L (ref 11–51)

## 2023-05-27 MED ORDER — HYDROMORPHONE HCL 1 MG/ML IJ SOLN
0.5000 mg | Freq: Once | INTRAMUSCULAR | Status: AC
  Administered 2023-05-27: 0.5 mg via INTRAVENOUS
  Filled 2023-05-27: qty 1

## 2023-05-27 MED ORDER — IOHEXOL 350 MG/ML SOLN
100.0000 mL | Freq: Once | INTRAVENOUS | Status: AC | PRN
  Administered 2023-05-27: 100 mL via INTRAVENOUS

## 2023-05-27 MED ORDER — SODIUM CHLORIDE 0.9 % IV BOLUS
1000.0000 mL | Freq: Once | INTRAVENOUS | Status: AC
  Administered 2023-05-27: 1000 mL via INTRAVENOUS

## 2023-05-27 NOTE — Discharge Instructions (Signed)
 We evaluated you for your abdominal pain.  Your testing in the emergency department is reassuring.  We obtained a CT scan of your abdomen and a CT scan of your chest.  We did not find any dangerous problems related to your recent surgery.  Please continue to take your prescribed pain medication as needed.  You can also apply ice to the area of pain to help.  Please follow-up very closely with your [facility] surgeon.  If you do have any worsening symptoms such as increasing pain, vomiting, fevers or chills, or any other new symptoms, please return to the emergency department so we can reassess you.

## 2023-05-27 NOTE — ED Triage Notes (Signed)
 Pt reports burning, shooting pain to RUQ that started this afternoon; denies other sxs; s/p adrenalectomy on 4/25

## 2023-05-27 NOTE — ED Provider Notes (Signed)
 [place] [facility] DE[facility]MENT AT [facility] Provider Note  CSN: [id number] Arrival date & time: 05/27/23 1909  Chief Complaint(s) Abdominal Pain  HPI Derrick Scott is a 48 y.o. male history of diabetes, asthma, recent surgery for adrenalectomy for pheochromocytoma presenting to the emergency department with right upper quadrant pain.  Reports the pain began this afternoon.  Reports the pain began suddenly.  No nausea or vomiting.  Worse when taking a deep breath.  No diarrhea.  Pain is worse with movement.  Denies similar episodes since the surgery.   Past Medical History Past Medical History:  Diagnosis Date   Arthritis    knee bilateral   Asthma    as a child "NO attacks"   Diabetes mellitus without complication (HCC)    border line no medicationa   GERD (gastroesophageal reflux disease)    High cholesterol    Medial meniscus tear 01/2012   right knee   Obesity    Pneumonia    Renal disorder    hematuria,kidney stones   Sleep apnea    Vertigo, benign paroxysmal 2015   Patient Active Problem List   Diagnosis Date Noted   Situational anxiety 12/28/2022   Migraine without aura and without status migrainosus, not intractable 10/07/2022   Hyperlipidemia associated with type 2 diabetes mellitus (HCC) 03/30/2022   Hypertension 10/28/2021   Obesity, morbid (HCC) 10/27/2021   Family history of breast cancer 09/21/2021   Family history of prostate cancer in father 09/21/2021   Rhinitis 03/04/2021   Chronic left shoulder pain 12/16/2020   Screening for malignant neoplasm of prostate 12/16/2020   Chronic midline low back pain without sciatica 12/16/2020   Bilateral groin pain 04/29/2020   DM (diabetes mellitus) (HCC) 12/25/2019   Internal hemorrhoids without complication 02/12/2019   Other tear of medial meniscus, current injury, left knee, subsequent encounter 05/04/2017   Pes planus 02/07/2017   Laryngopharyngeal reflux (LPR) 07/24/2015   Post corneal  transplant 07/25/2014   Vertigo 08/30/2013   Pheochromocytoma 05/22/2013   OSA on CPAP 07/31/2009   Home Medication(s) Prior to Admission medications   Medication Sig Start Date End Date Taking? Authorizing Provider  acetaminophen  (TYLENOL ) 325 MG tablet Take 325 mg by mouth every 8 (eight) hours as needed for moderate pain (pain score 4-6).    [provider]  atorvastatin  (LIPITOR) 40 MG tablet Take 1 tablet (40 mg total) by mouth daily. 01/31/23   Charise Companion, MD  cetirizine  (ZYRTEC ) 10 MG tablet Take 1 tablet (10 mg total) by mouth daily. Patient not taking: Reported on 05/23/2023 03/24/22   Deatra Face, MD  cyclobenzaprine  (FLEXERIL ) 10 MG tablet Take 10 mg by mouth 3 (three) times daily. 05/16/23 05/27/23  [provider]  doxazosin (CARDURA) 1 MG tablet Take 1 mg by mouth 2 (two) times daily. Patient not taking: Reported on 05/23/2023    [provider]  gabapentin (NEURONTIN) 300 MG capsule Take 300 mg by mouth 3 (three) times daily. 05/16/23 05/16/24  [provider]  hydrocortisone (CORTEF) 5 MG tablet Take 5 mg by mouth See admin instructions. TAKE 4 TABLETS (20 MG) AT 8 AM AND 2 TABLET (10 MG) AT 3 PM 05/16/23   [provider]  losartan  (COZAAR ) 100 MG tablet Take 1 tablet (100 mg total) by mouth daily. Patient taking differently: Take 50 mg by mouth daily. Decreased to 50mg  per hospital discharge 05/15/23 11/16/22   Charise Companion, MD  meclizine  (ANTIVERT ) 25 MG tablet Take 1  tablet (25 mg total) by mouth 3 (three) times daily as needed for dizziness (do not use for more than 3days). Patient not taking: Reported on 05/23/2023 06/17/22   Nche, Connye Delaine, NP  meloxicam  (MOBIC ) 15 MG tablet Take 1 tablet by mouth daily. Patient not taking: Reported on 05/23/2023 09/29/22   [provider]  metFORMIN  (GLUCOPHAGE ) 500 MG tablet Take 0.5 tablets (250 mg total) by mouth 2 (two) times daily with a meal. 04/25/23   Azell Boll, MD  Multiple  Vitamins-Minerals (ONE-A-DAY MENS HEALTH FORMULA PO) Take 1 tablet by mouth daily.    [provider]  omeprazole  (PRILOSEC) 40 MG capsule Take 1 capsule (40 mg total) by mouth 2 (two) times daily. 04/25/23   Azell Boll, MD  psyllium (METAMUCIL) 58.6 % packet Take 1 packet by mouth daily.    [provider]  rizatriptan  (MAXALT ) 5 MG tablet Take 1-2 tablets (5-10 mg total) by mouth as needed for migraine. May repeat in 2 hours if needed. No more than 30mg  in 24hrs Patient not taking: Reported on 05/23/2023 10/07/22   Nche, Connye Delaine, NP  senna (SENOKOT) 8.6 MG tablet Take 2 tablets by mouth 2 (two) times daily. Wife will pick this up today 05/16/23 Patient not taking: Reported on 05/23/2023    [provider]  tirzepatide  (MOUNJARO ) 7.5 MG/0.5ML Pen Inject 7.5 mg into the skin once a week. 01/31/23   Charise Companion, MD                                                                                                                                    Past Surgical History Past Surgical History:  Procedure Laterality Date   ADRENALECTOMY     CORNEAL TRANSPLANT Left 12/17/2009   KNEE ARTHROSCOPY  02/20/2012   Procedure: ARTHROSCOPY KNEE;  Surgeon: Boston Byers, MD;  Location: [facility];  Service: Orthopedics;  Laterality: Right;  Chondroplasia patella femoral joint, medial meniscal debriedment   Family History Family History  Problem Relation Age of Onset   Asthma Mother    Other Mother        kidney immune disease -had kidney transplant   Diabetes Father    Prostate cancer Father    Osteoarthritis Sister    Diabetes Sister    Colon cancer Neg Hx    Colon polyps Neg Hx    Esophageal cancer Neg Hx    Rectal cancer Neg Hx    Stomach cancer Neg Hx     Social History Social History   Tobacco Use   Smoking status: Never   Smokeless tobacco: Never  Vaping Use   Vaping status: Never Used  Substance Use Topics   Alcohol use: No   Drug use: No    Allergies Shrimp [shellfish allergy]  Review of Systems Review of Systems  All other systems reviewed and are negative.   Physical Exam Vital  Signs  I have reviewed the triage vital signs BP 105/73   Pulse (!) 112   Temp 98 F (36.7 C) (Oral)   Resp 18   Ht 5\' 8"  (1.727 m)   Wt 120.2 kg   SpO2 100%   BMI 40.29 kg/m  Physical Exam Vitals and nursing note reviewed.  Constitutional:      General: He is not in acute distress.    Appearance: Normal appearance.  HENT:     Mouth/Throat:     Mouth: Mucous membranes are moist.  Eyes:     Conjunctiva/sclera: Conjunctivae normal.  Cardiovascular:     Rate and Rhythm: Normal rate and regular rhythm.  Pulmonary:     Effort: Pulmonary effort is normal. No respiratory distress.     Breath sounds: Normal breath sounds.  Abdominal:     General: Abdomen is flat.     Palpations: Abdomen is soft.     Tenderness: There is abdominal tenderness in the right upper quadrant. There is right CVA tenderness.     Comments: Scattered surgical site from laparoscopic/robotic surgery all appear clean dry and intact with Steri-Strips overlying  Musculoskeletal:     Right lower leg: No edema.     Left lower leg: No edema.  Skin:    General: Skin is warm and dry.     Capillary Refill: Capillary refill takes less than 2 seconds.  Neurological:     Mental Status: He is alert and oriented to person, place, and time. Mental status is at baseline.  Psychiatric:        Mood and Affect: Mood normal.        Behavior: Behavior normal.     ED Results and Treatments Labs (all labs ordered are listed, but only abnormal results are displayed) Labs Reviewed  COMPREHENSIVE METABOLIC PANEL WITH GFR - Abnormal; Notable for the following components:      Result Value   Glucose, Bld 104 (*)    ALT 101 (*)    All other components within normal limits  URINALYSIS, ROUTINE W REFLEX MICROSCOPIC - Abnormal; Notable for the following components:   Hgb urine  dipstick TRACE (*)    All other components within normal limits  URINALYSIS, MICROSCOPIC (REFLEX) - Abnormal; Notable for the following components:   Bacteria, UA RARE (*)    All other components within normal limits  LIPASE, BLOOD  CBC WITH DIFFERENTIAL/PLATELET                                                                                                                          Radiology CT ABDOMEN PELVIS W CONTRAST Result Date: 05/27/2023 CLINICAL DATA:  Pt reports burning, shooting pain to RUQ that started this afternoon; denies other sxs; s/p adrenalectomy on 4/25 Abdominal pain, acute, nonlocalized; Pulmonary embolism (PE) suspected, high prob EXAM: CT ANGIOGRAPHY CHEST CT ABDOMEN AND PELVIS WITH CONTRAST TECHNIQUE: Multidetector CT imaging of the chest was performed using the standard protocol during bolus administration  of intravenous contrast. Multiplanar CT image reconstructions and MIPs were obtained to evaluate the vascular anatomy. Multidetector CT imaging of the abdomen and pelvis was performed using the standard protocol during bolus administration of intravenous contrast. RADIATION DOSE REDUCTION: This exam was performed according to the departmental dose-optimization program which includes automated exposure control, adjustment of the mA and/or kV according to patient size and/or use of iterative reconstruction technique. CONTRAST:  OMNIPAQUE  IOHEXOL  350 MG/ML SOLN COMPARISON:  CT abdomen pelvis 09/24/2022 FINDINGS: CTA CHEST FINDINGS Cardiovascular: Fair opacification of the pulmonary arteries to the segmental level. No evidence of pulmonary embolism. No central or segmental pulmonary embolus. Limited evaluation of the subsegmental level due to timing of contrast. The main pulmonary artery is enlarged in caliber measuring up 3.6 cm. Normal heart size. No significant pericardial effusion. The thoracic aorta is normal in caliber. No atherosclerotic plaque of the thoracic aorta. No  coronary artery calcifications. Mediastinum/Nodes: No enlarged mediastinal, hilar, or axillary lymph nodes. Thyroid  gland, trachea, and esophagus demonstrate no significant findings. Lungs/Pleura: No focal consolidation. No pulmonary nodule. No pulmonary mass. No pleural effusion. No pneumothorax. Musculoskeletal: No chest wall abnormality. No suspicious lytic or blastic osseous lesions. No acute displaced fracture. Review of the MIP images confirms the above findings. CT ABDOMEN and PELVIS FINDINGS Hepatobiliary: No focal liver abnormality. No gallstones, gallbladder wall thickening, or pericholecystic fluid. No biliary dilatation. Pancreas: No focal lesion. Normal pancreatic contour. No surrounding inflammatory changes. No main pancreatic ductal dilatation. Spleen: Normal in size without focal abnormality. Adrenals/Urinary Tract: Status post right adrenalectomy. Trace soft tissue density along the right surgical bed likely postsurgical changes. No left adrenal nodule. Bilateral kidneys enhance symmetrically. No hydronephrosis. No hydroureter. The urinary bladder is unremarkable. On delayed imaging, there is no urothelial wall thickening and there are no filling defects in the opacified portions of the bilateral collecting systems or ureters. Stomach/Bowel: Stomach is within normal limits. No evidence of bowel wall thickening or dilatation. Colonic diverticulosis. Appendix appears normal. Vascular/Lymphatic: No abdominal aorta or iliac aneurysm. No abdominal, pelvic, or inguinal lymphadenopathy. Reproductive: Prostate is unremarkable. Other: No intraperitoneal free fluid. No intraperitoneal free gas. No organized fluid collection. Musculoskeletal: No abdominal wall hernia or abnormality. No suspicious lytic or blastic osseous lesions. No acute displaced fracture. Review of the MIP images confirms the above findings. IMPRESSION: 1. No pulmonary embolus. 2. No acute intrathoracic, intra-abdominal, intrapelvic  abnormality. 3. Status post right adrenalectomy. 4. Colonic diverticulosis with no acute diverticulitis. Electronically Signed   By: Morgane  Naveau M.D.   On: 05/27/2023 22:29   CT Angio Chest PE W/Cm &/Or Wo Cm Result Date: 05/27/2023 CLINICAL DATA:  Pt reports burning, shooting pain to RUQ that started this afternoon; denies other sxs; s/p adrenalectomy on 4/25 Abdominal pain, acute, nonlocalized; Pulmonary embolism (PE) suspected, high prob EXAM: CT ANGIOGRAPHY CHEST CT ABDOMEN AND PELVIS WITH CONTRAST TECHNIQUE: Multidetector CT imaging of the chest was performed using the standard protocol during bolus administration of intravenous contrast. Multiplanar CT image reconstructions and MIPs were obtained to evaluate the vascular anatomy. Multidetector CT imaging of the abdomen and pelvis was performed using the standard protocol during bolus administration of intravenous contrast. RADIATION DOSE REDUCTION: This exam was performed according to the departmental dose-optimization program which includes automated exposure control, adjustment of the mA and/or kV according to patient size and/or use of iterative reconstruction technique. CONTRAST:  OMNIPAQUE  IOHEXOL  350 MG/ML SOLN COMPARISON:  CT abdomen pelvis 09/24/2022 FINDINGS: CTA CHEST FINDINGS Cardiovascular: Fair opacification of the  pulmonary arteries to the segmental level. No evidence of pulmonary embolism. No central or segmental pulmonary embolus. Limited evaluation of the subsegmental level due to timing of contrast. The main pulmonary artery is enlarged in caliber measuring up 3.6 cm. Normal heart size. No significant pericardial effusion. The thoracic aorta is normal in caliber. No atherosclerotic plaque of the thoracic aorta. No coronary artery calcifications. Mediastinum/Nodes: No enlarged mediastinal, hilar, or axillary lymph nodes. Thyroid  gland, trachea, and esophagus demonstrate no significant findings. Lungs/Pleura: No focal  consolidation. No pulmonary nodule. No pulmonary mass. No pleural effusion. No pneumothorax. Musculoskeletal: No chest wall abnormality. No suspicious lytic or blastic osseous lesions. No acute displaced fracture. Review of the MIP images confirms the above findings. CT ABDOMEN and PELVIS FINDINGS Hepatobiliary: No focal liver abnormality. No gallstones, gallbladder wall thickening, or pericholecystic fluid. No biliary dilatation. Pancreas: No focal lesion. Normal pancreatic contour. No surrounding inflammatory changes. No main pancreatic ductal dilatation. Spleen: Normal in size without focal abnormality. Adrenals/Urinary Tract: Status post right adrenalectomy. Trace soft tissue density along the right surgical bed likely postsurgical changes. No left adrenal nodule. Bilateral kidneys enhance symmetrically. No hydronephrosis. No hydroureter. The urinary bladder is unremarkable. On delayed imaging, there is no urothelial wall thickening and there are no filling defects in the opacified portions of the bilateral collecting systems or ureters. Stomach/Bowel: Stomach is within normal limits. No evidence of bowel wall thickening or dilatation. Colonic diverticulosis. Appendix appears normal. Vascular/Lymphatic: No abdominal aorta or iliac aneurysm. No abdominal, pelvic, or inguinal lymphadenopathy. Reproductive: Prostate is unremarkable. Other: No intraperitoneal free fluid. No intraperitoneal free gas. No organized fluid collection. Musculoskeletal: No abdominal wall hernia or abnormality. No suspicious lytic or blastic osseous lesions. No acute displaced fracture. Review of the MIP images confirms the above findings. IMPRESSION: 1. No pulmonary embolus. 2. No acute intrathoracic, intra-abdominal, intrapelvic abnormality. 3. Status post right adrenalectomy. 4. Colonic diverticulosis with no acute diverticulitis. Electronically Signed   By: Morgane  Naveau M.D.   On: 05/27/2023 22:29    Pertinent labs & imaging  results that were available during my care of the patient were reviewed by me and considered in my medical decision making (see MDM for details).  Medications Ordered in ED Medications  iohexol  (OMNIPAQUE ) 350 MG/ML injection 100 mL (100 mLs Intravenous Contrast Given 05/27/23 2146)  sodium chloride  0.9 % bolus 1,000 mL (0 mLs Intravenous Stopped 05/27/23 2332)  HYDROmorphone  (DILAUDID ) injection 0.5 mg (0.5 mg Intravenous Given 05/27/23 2220)                                                                                                                                     Procedures Procedures  (including critical care time)  Medical Decision Making / ED Course   MDM:  48 year old presenting to the emergency department abdominal pain.  Patient overall well-appearing, vitals with mild tachycardia.  Patient did have recent robotic adrenalectomy at outside hospital for  pheochromocytoma.  Has been doing well since.  Has been on hydrocortisone for some adrenal insufficiency which he has been compliant with.  Differential includes postoperative complications such as hematoma, abscess, infection, pulmonary embolism, also include other process such as cholecystitis, volvulus, perforation, obstruction.  Will obtain imaging.  Will obtain CT chest given his pleuritic symptoms and recent surgery.  Clinical Course as of 05/27/23 2334  Sat May 27, 2023  2332 Workup reassuring.  No leukocytosis.  CT scans with no acute process.  Vitals do have mild tachycardia however CT angiography of the chest is negative for PE.  Was also tachycardic on recent ER visit. [WS]    Clinical Course User Index [WS] Mordecai Applebaum, MD     Additional history obtained: -Additional history obtained from spouse -External records from outside source obtained and reviewed including: Chart review including previous notes, labs, imaging, consultation notes including prior [facility] notes    Lab Tests: -I ordered,  reviewed, and interpreted labs.   The pertinent results include:   Labs Reviewed  COMPREHENSIVE METABOLIC PANEL WITH GFR - Abnormal; Notable for the following components:      Result Value   Glucose, Bld 104 (*)    ALT 101 (*)    All other components within normal limits  URINALYSIS, ROUTINE W REFLEX MICROSCOPIC - Abnormal; Notable for the following components:   Hgb urine dipstick TRACE (*)    All other components within normal limits  URINALYSIS, MICROSCOPIC (REFLEX) - Abnormal; Notable for the following components:   Bacteria, UA RARE (*)    All other components within normal limits  LIPASE, BLOOD  CBC WITH DIFFERENTIAL/PLATELET    Imaging Studies ordered: I ordered imaging studies including CT abdomen/chest On my interpretation imaging demonstrates no acute process I independently visualized and interpreted imaging. I agree with the radiologist interpretation   Medicines ordered and prescription drug management: Meds ordered this encounter  Medications   iohexol  (OMNIPAQUE ) 350 MG/ML injection 100 mL   sodium chloride  0.9 % bolus 1,000 mL   HYDROmorphone  (DILAUDID ) injection 0.5 mg    -I have reviewed the patients home medicines and have made adjustments as needed  Social Determinants of Health:  Diagnosis or treatment significantly limited by social determinants of health: obesity   Reevaluation: After the interventions noted above, I reevaluated the patient and found that their symptoms have improved  Co morbidities that complicate the patient evaluation  Past Medical History:  Diagnosis Date   Arthritis    knee bilateral   Asthma    as a child "NO attacks"   Diabetes mellitus without complication (HCC)    border line no medicationa   GERD (gastroesophageal reflux disease)    High cholesterol    Medial meniscus tear 01/2012   right knee   Obesity    Pneumonia    Renal disorder    hematuria,kidney stones   Sleep apnea    Vertigo, benign paroxysmal 2015       Dispostion: Disposition decision including need for hospitalization was considered, and patient discharged    Final Clinical Impression(s) / ED Diagnoses Final diagnoses:  Post-operative pain     This chart was dictated using voice recognition software.  Despite best efforts to proofread,  errors can occur which can change the documentation meaning.    Mordecai Applebaum, MD 05/27/23 [phone]

## 2023-05-30 ENCOUNTER — Telehealth: Payer: Self-pay | Admitting: *Deleted

## 2023-05-30 DIAGNOSIS — Z09 Encounter for follow-up examination after completed treatment for conditions other than malignant neoplasm: Secondary | ICD-10-CM | POA: Diagnosis not present

## 2023-05-30 DIAGNOSIS — D3501 Benign neoplasm of right adrenal gland: Secondary | ICD-10-CM | POA: Diagnosis not present

## 2023-05-30 DIAGNOSIS — Y838 Other surgical procedures as the cause of abnormal reaction of the patient, or of later complication, without mention of misadventure at the time of the procedure: Secondary | ICD-10-CM | POA: Diagnosis not present

## 2023-05-30 DIAGNOSIS — Z86018 Personal history of other benign neoplasm: Secondary | ICD-10-CM | POA: Diagnosis not present

## 2023-05-30 DIAGNOSIS — Z79899 Other long term (current) drug therapy: Secondary | ICD-10-CM | POA: Diagnosis not present

## 2023-05-30 DIAGNOSIS — E896 Postprocedural adrenocortical (-medullary) hypofunction: Secondary | ICD-10-CM | POA: Diagnosis not present

## 2023-05-30 NOTE — Progress Notes (Unsigned)
 Complex Care Management Note Care Guide Note  05/30/2023 Name: Derrick Scott MRN: [id number] DOB: 1975/07/16   Complex Care Management Outreach Attempts: An unsuccessful telephone outreach was attempted today to offer the patient information about available complex care management services.  Follow Up Plan:  Additional outreach attempts will be made to offer the patient complex care management information and services.   Encounter Outcome:  No Answer  Barnie Bora  [facility]  [facility], [facility] Guide  Direct Dial: [phone]  Fax [phone]

## 2023-05-31 ENCOUNTER — Other Ambulatory Visit: Payer: Self-pay

## 2023-05-31 NOTE — Progress Notes (Signed)
 Complex Care Management Note  Care Guide Note 05/31/2023 Name: Omarri Ngan II MRN: [id number] DOB: 06-Aug-1975  Adonijah Kubly II is a 48 y.o. year old male who sees Charise Companion, MD for primary care. I reached out to Dwyane Glad II by phone today to offer complex care management services.  Mr. Fahrenkrug was given information about Complex Care Management services today including:   The Complex Care Management services include support from the care team which includes your Nurse Care Manager, Clinical Social Worker, or Pharmacist.  The Complex Care Management team is here to help remove barriers to the health concerns and goals most important to you. Complex Care Management services are voluntary, and the patient may decline or stop services at any time by request to their care team member.   Complex Care Management Consent Status: Patient agreed to services and verbal consent obtained.   Follow up plan:  Telephone appointment with complex care management team member scheduled for:  5/22  Encounter Outcome:  Patient Scheduled Barnie Bora  [facility]  [facility], [facility] Guide  Direct Dial: [phone]  Fax [phone]

## 2023-05-31 NOTE — Patient Instructions (Signed)
 Visit Information  Thank you for taking time to visit with me today. Please don't hesitate to contact me if I can be of assistance to you before our next scheduled telephone appointment.  Our next appointment is by telephone on 06/08/23 at 1pm  Following is a copy of your care plan:   Goals Addressed             This Visit's Progress    VBCI Transitions of Care (TOC) Care Plan       Problems:  Recent Hospitalization for treatment of Pheochromocytoma of right adrenal gland - Post- Robotic Assisted Laparoscopic Adrenalectomy, or Laparoscopic Adrenalectomy SDOH barrier: Patient has been out of work related to injury and has been receiving worker's comp - now out of work related to surgery - Patient agreeable to SW referral related concern about finances/rent  Patient utilized ED for sore throat on 05/21/23 after missing PCP appointment 05/23/23 Patient utilized ED 5/10 for post op pain - reviewed with patient calling RN or MD with non emergent issues  Goal:  Over the next 30 days, the patient will not experience hospital readmission  Interventions:  Transitions of Care: Doctor Visits  - discussed the importance of doctor visits Contacted provider for patient needs Called office of PCP - appointment scheduled with Sarahann Cumins DO due to Violetta Grice with no available appointment until 06/21/23 - Patient was a no show for appointment [facility] RN offered to reschedule and patient declined stating he has appointment with surgeon 05/30/23 and prefers to just keep that appointment Post-op wound/incision care reviewed with patient/caregiver Reviewed Signs and symptoms of infection TOC RN reviewed with patient risk of acquiring other illnesses while sitting at ED and better to reach out to RN or PCP for issues that can be addressed in office rather than ED   Surgery (Robotic Assisted Laparoscopic Adrenalectom): Evaluation of current treatment plan related to Patient reports post operative pain and  understand to call surgeon if pain is unmanagement reviewed post-operative instructions with patient/caregiver reviewed medications with patient and addressed questions reviewed scheduled provider appointments with patients: surgeon, Dr Daine Drummer Stang appointment 05/30/23 - Update 05/31/23 patient saw surgeon and reports MD felt he is progressing well and referred to [facility]  confirmed availability of transportation to all appointments Patient states his wife can drive him as needed  Patient Self Care Activities:  Attend all scheduled provider appointments Call pharmacy for medication refills 3-7 days in advance of running out of medications Call provider office for new concerns or questions  Notify RN Care Manager of [facility] call rescheduling needs Participate in Transition of Care Program/Attend [facility] scheduled calls Take medications as prescribed    Plan:  Surgeon appointment scheduled for: 06/09/23 - [facility] Telephone follow up appointment with care management team member scheduled for:  06/08/23 1pm The patient has been provided with contact information for the care management team and has been advised to call with any health related questions or concerns.         Patient verbalizes understanding of instructions and care plan provided today and agrees to view in MyChart. Active MyChart status and patient understanding of how to access instructions and care plan via MyChart confirmed with patient.     Telephone follow up appointment with care management team member scheduled for: 06/08/23 1pm The patient has been provided with contact information for the care management team and has been advised to call with any health related questions or concerns.   Please call  the care guide team at [phone] if you need to cancel or reschedule your appointment.   Please call the Suicide and Crisis Lifeline: 988 call the [place]  National Suicide Prevention Lifeline: 380-493-0379  or TTY: (915)819-8776 TTY (830)616-3668) to talk to a trained counselor call 911 if you are experiencing a Mental Health or Behavioral Health Crisis or need someone to talk to.  Tonia Frankel RN, CCM [place]  VBCI-Population Health RN Care Manager [phone]

## 2023-05-31 NOTE — Transitions of Care (Post Inpatient/ED Visit) (Signed)
 Transition of Care week 3  Visit Note  05/31/2023  Name: Derrick Scott MRN: [id number]          DOB: 1975/06/04  Situation: Patient enrolled in [facility] 30-day program. Visit completed with patient by telephone.   Background: Patient being followed in [facility] program related hospitalization at [facility] for Pheochromocytoma of right adrenal gland - Post- Robotic Assisted Laparoscopic Adrenalectomy. Patient returned to ED 05/27/23 for post op pain. Patient reports he discussed this with surgeon at appointment 05/30/23 and states MD said it was likely nerve pain. Patient has Gabapentin and is taking as directed. Patient reports right abdominal iincisions are closed and states he is doing well with no signs of infection. Patient states surgeon referred him to [facility] endorinology and appt is scheduled 06/09/23 Initial Transition Care Management Follow-up Telephone Call    Past Medical History:  Diagnosis Date   Arthritis    knee bilateral   Asthma    as a child "NO attacks"   Diabetes mellitus without complication (HCC)    border line no medicationa   GERD (gastroesophageal reflux disease)    High cholesterol    Medial meniscus tear 01/2012   right knee   Obesity    Pneumonia    Renal disorder    hematuria,kidney stones   Sleep apnea    Vertigo, benign paroxysmal 2015    Assessment: Patient Reported Symptoms: Cognitive Cognitive Status: Alert and oriented to person, place, and time, Normal speech and language skills Cognitive/Intellectual Conditions Management [RPT]: None reported or documented in medical history or problem list      Neurological Neurological Review of Symptoms: No symptoms reported    HEENT HEENT Symptoms Reported: No symptoms reported      Cardiovascular Cardiovascular Symptoms Reported: No symptoms reported Cardiovascular Conditions: Hypertension, High blood cholesterol Cardiovascular Management Strategies: Medication therapy, Diet  modification Weight: 274 lb (124.3 kg) Cardiovascular Self-Management Outcome: 4 (good) Cardiovascular Comment: patient states last BP 126/82  Respiratory Respiratory Symptoms Reported: No symptoms reported    Endocrine Patient reports the following symptoms related to hypoglycemia or hyperglycemia : No symptoms reported Is patient diabetic?: Yes Is patient checking blood sugars at home?: No Endocrine Conditions: Diabetes Endocrine Management Strategies: Medication therapy, Diet modification Endocrine Self-Management Outcome: 4 (good) Endocrine Comment: patient states last glucose in ED was 104  Gastrointestinal Gastrointestinal Symptoms Reported: Abdominal pain or discomfort Additional Gastrointestinal Details: patient states he saw surgeon 05/30/23 and continues with some mild discomfort that surgeon told him this was to be expected Gastrointestinal Self-Management Outcome: 4 (good) Gastrointestinal Comment: patient denies issue with moving bowels    Genitourinary Genitourinary Symptoms Reported: No symptoms reported    Integumentary Integumentary Symptoms Reported: Incision Additional Integumentary Details: patient states all steri strips have fallen off and glue has come off and incisions were checked by surgeon 05/30/23 and no issues noted Skin Self-Management Outcome: 4 (good) Skin Comment: patient states all steri strips have fallen off and glue has come off and incisions were checked by surgeon 05/30/23 and no issues noted  Musculoskeletal Musculoskelatal Symptoms Reviewed: No symptoms reported        Psychosocial Psychosocial Symptoms Reported: No symptoms reported         There were no vitals filed for this visit.  Medications Reviewed Today     Reviewed by Sharmaine Dearth, RN (Registered Nurse) on 05/31/23 at 1314  Med List Status: <None>   Medication Order Taking? Sig Documenting Provider Last Dose Status Informant  acetaminophen  (  TYLENOL ) 325 MG tablet [id number]  Yes Take 325 mg by mouth every 8 (eight) hours as needed for moderate pain (pain score 4-6). [provider] Taking Active   atorvastatin  (LIPITOR) 40 MG tablet [id number] Yes Take 1 tablet (40 mg total) by mouth daily. Charise Companion, MD Taking Active   cetirizine  (ZYRTEC ) 10 MG tablet [id number] Yes Take 1 tablet (10 mg total) by mouth daily. Deatra Face, MD Taking Active   doxazosin (CARDURA) 1 MG tablet [id number] No Take 1 mg by mouth 2 (two) times daily.  Patient not taking: Reported on 05/16/2023   [provider] Not Taking Consider Medication Status and Discontinue (No longer needed (for PRN medications))   gabapentin (NEURONTIN) 300 MG capsule [id number] Yes Take 300 mg by mouth 3 (three) times daily. [provider] Taking Active   hydrocortisone (CORTEF) 5 MG tablet [id number] Yes Take 5 mg by mouth See admin instructions. TAKE 4 TABLETS (20 MG) AT 8 AM AND 2 TABLET (10 MG) AT 3 PM [provider] Taking Active   losartan  (COZAAR ) 100 MG tablet [id number] Yes Take 1 tablet (100 mg total) by mouth daily.  Patient taking differently: Take 50 mg by mouth daily. Decreased to 50mg  per hospital discharge 05/15/23   Charise Companion, MD Taking Active   meclizine  (ANTIVERT ) 25 MG tablet [id number] No Take 1 tablet (25 mg total) by mouth 3 (three) times daily as needed for dizziness (do not use for more than 3days).  Patient not taking: Reported on 05/16/2023   Kandace Organ, NP Not Taking Active   meloxicam  (MOBIC ) 15 MG tablet [id number] No Take 1 tablet by mouth daily.  Patient not taking: Reported on 05/31/2023   [provider] Not Taking Active            Med Note [facility], Laquinda Moller A   Tue May 16, 2023  1:17 PM) Patient reports he has not needed  metFORMIN  (GLUCOPHAGE ) 500 MG tablet [id number] Yes Take 0.5 tablets (250 mg total) by mouth 2 (two) times daily with a meal. Azell Boll, MD Taking Active   Multiple Vitamins-Minerals (ONE-A-DAY MENS  HEALTH FORMULA PO) [id number] Yes Take 1 tablet by mouth daily. [provider] Taking Active            Med Note [facility], Talise Sligh A   Tue May 16, 2023  1:22 PM) Per discharge instruction hold for 1 week after surgery  omeprazole  (PRILOSEC) 40 MG capsule [id number] Yes Take 1 capsule (40 mg total) by mouth 2 (two) times daily. Azell Boll, MD Taking Active   psyllium (METAMUCIL) 58.6 % packet [id number] No Take 1 packet by mouth daily.  Patient not taking: Reported on 05/31/2023   [provider] Not Taking Active   rizatriptan  (MAXALT ) 5 MG tablet [id number] No Take 1-2 tablets (5-10 mg total) by mouth as needed for migraine. May repeat in 2 hours if needed. No more than 30mg  in 24hrs  Patient not taking: Reported on 05/31/2023   Nche, Connye Delaine, NP Not Taking Active            Med Note [facility], Sekai Nayak A   Tue May 16, 2023  1:17 PM) Hasn't needed per patient  senna (SENOKOT) 8.6 MG tablet [id number] No Take 2 tablets by mouth 2 (two) times daily. Wife will pick this up today 05/16/23  Patient not taking: Reported on 05/31/2023   [provider] Not Taking Active   tirzepatide  (MOUNJARO ) 7.5 MG/0.5ML Pen [id number]  Yes Inject 7.5 mg into the skin once a week. Charise Companion, MD Taking Active             Recommendation:   Specialty provider follow-up [facility] 06/09/23  Follow Up Plan:   Telephone follow up appointment date/time:  06/08/23 1pm  Tonia Frankel RN, CCM [place]  VBCI-Population Health RN Care Manager [phone]

## 2023-06-01 ENCOUNTER — Encounter: Payer: Self-pay | Admitting: Family Medicine

## 2023-06-01 DIAGNOSIS — E896 Postprocedural adrenocortical (-medullary) hypofunction: Secondary | ICD-10-CM | POA: Insufficient documentation

## 2023-06-08 ENCOUNTER — Other Ambulatory Visit: Payer: Self-pay

## 2023-06-08 ENCOUNTER — Telehealth: Payer: Self-pay

## 2023-06-08 NOTE — Patient Instructions (Signed)
 Visit Information  Thank you for taking time to visit with me today. Please don't hesitate to contact me if I can be of assistance to you before our next scheduled appointment.  Our next appointment is by telephone on 06/22/23 at 11:00 Please call the care guide team at [phone] if you need to cancel or reschedule your appointment.   Following is a copy of your care plan:   Goals Addressed             This Visit's Progress    BSW VBCI Social Work Care Plan       Problems:   Corporate treasurer  and Housing   CSW Clinical Goal(s):   Over the next 15 days the Patient will work with Child psychotherapist to address concerns related to assistance with paying rent.  Interventions:  Social Determinants of Health in Patient with housing : SDOH assessments completed: Financial Strain  and Housing  Evaluation of current treatment plan related to unmet needs Housing resources:SW emailed housing resources to email on file.  Patient Goals/Self-Care Activities:  Patient will follow up with community resources provided.   Plan:   The care management team will reach out to the patient again over the next 15 days.        Please call the Suicide and Crisis Lifeline: 988 call the [place]  National Suicide Prevention Lifeline: 631-646-6499 or TTY: 787-035-4272 TTY 8103990600) to talk to a trained counselor call 1-800-273-TALK (toll free, 24 hour hotline) go to [facility] [address] [place] [phone]) call 911 if you are experiencing a Mental Health or Behavioral Health Crisis or need someone to talk to.  Patient verbalizes understanding of instructions and care plan provided today and agrees to view in MyChart. Active MyChart status and patient understanding of how to access instructions and care plan via MyChart confirmed with patient.     Valora Gear, Florestine Hurl, MHA [place]  [facility] Social Worker, Population  Health [phone]

## 2023-06-08 NOTE — Patient Outreach (Signed)
 Complex Care Management   Visit Note  06/08/2023  Name:  Derrick Scott MRN: [id number] DOB: 1975/03/17  Situation: Referral received for Complex Care Management related to SDOH Barriers:  Housing rent assistance Financial Resource Strain I obtained verbal consent from Patient.  Visit completed with patient  on the phone  Background:   Past Medical History:  Diagnosis Date   Arthritis    knee bilateral   Asthma    as a child "NO attacks"   Diabetes mellitus without complication (HCC)    border line no medicationa   GERD (gastroesophageal reflux disease)    High cholesterol    Medial meniscus tear 01/2012   right knee   Obesity    Pneumonia    Renal disorder    hematuria,kidney stones   Sleep apnea    Vertigo, benign paroxysmal 2015    Assessment: SW completed a telephoen outreach with patient, he currently has no income. SW and patient agreed for resources for rent to be emailed to address on file.   SDOH Interventions    Flowsheet Row Patient Outreach Telephone from 06/08/2023 in [place] POPULATION HEALTH DEPARTMENT Telephone from 05/16/2023 in [place] POPULATION HEALTH DEPARTMENT Nutrition from 11/17/2022 in [place] [facility] Nutr Diab Ed  - A Dept Of [place]. [facility] Office Visit from 04/29/2020 in [facility] - A Dept Of [place]  SDOH Interventions      Food Insecurity Interventions -- Intervention Not Indicated Intervention Not Indicated --  Housing Interventions Community Resources Provided AMB Referral -- --  Transportation Interventions -- Intervention Not Indicated -- --  Utilities Interventions -- Intervention Not Indicated -- --  Depression Interventions/Treatment  -- -- -- Counseling  Financial Strain Interventions Community Resources Provided -- -- --         Recommendation:   No recommendations at this time.  Follow Up Plan:   06/22/23 at 11:00am  Valora Gear, BSW, MHA    Value Based [facility] Social Worker, Population Health [phone]

## 2023-06-09 ENCOUNTER — Telehealth: Payer: Self-pay

## 2023-06-09 DIAGNOSIS — R7989 Other specified abnormal findings of blood chemistry: Secondary | ICD-10-CM | POA: Diagnosis not present

## 2023-06-09 DIAGNOSIS — E279 Disorder of adrenal gland, unspecified: Secondary | ICD-10-CM | POA: Diagnosis not present

## 2023-06-09 DIAGNOSIS — D3501 Benign neoplasm of right adrenal gland: Secondary | ICD-10-CM | POA: Diagnosis not present

## 2023-06-09 NOTE — Transitions of Care (Post Inpatient/ED Visit) (Signed)
 Transition of Care week 4 Closure note Visit Note  06/09/2023  Name: Derrick Scott MRN: [id number]          DOB: 09-Feb-1975  Situation: Patient enrolled in [facility] 30-day program. Visit completed with patient by telephone.   Background: Patient being followed in [facility] program related hospitalization at [facility] for Pheochromocytoma of right adrenal gland - Post- Robotic Assisted Laparoscopic Adrenalectomy. Patient returned to ED 05/27/23 for post op pain. Patient reports he saw [facility] today and states he will return 06/15/23 for labs and states he is returning to work 06/13/23 and feels like he is "back to my old self". Patient reports BP 128/84 weight stable at 274lbs, patient denies pain. Patient voiced appreciation for the [facility] calls but states does not feel he needs another call since he is returning to work - [employer]  will be closed   Initial Transition Care Management Follow-up Telephone Call    Past Medical History:  Diagnosis Date   Arthritis    knee bilateral   Asthma    as a child "NO attacks"   Diabetes mellitus without complication (HCC)    border line no medicationa   GERD (gastroesophageal reflux disease)    High cholesterol    Medial meniscus tear 01/2012   right knee   Obesity    Pneumonia    Renal disorder    hematuria,kidney stones   Sleep apnea    Vertigo, benign paroxysmal 2015    Assessment: Patient Reported Symptoms: Cognitive Cognitive Status: Alert and oriented to person, place, and time, Normal speech and language skills Cognitive/Intellectual Conditions Management [RPT]: None reported or documented in medical history or problem list      Neurological Neurological Review of Symptoms: No symptoms reported    HEENT HEENT Symptoms Reported: No symptoms reported      Cardiovascular Cardiovascular Symptoms Reported: No symptoms reported Weight: 274 lb (124.3 kg)  Respiratory Respiratory Symptoms Reported: No symptoms  reported    Endocrine Patient reports the following symptoms related to hypoglycemia or hyperglycemia : No symptoms reported Is patient diabetic?: Yes Endocrine Conditions: Diabetes Endocrine Comment: patient states he normally does not check his sugar but 2 days ago was 105  Gastrointestinal Gastrointestinal Symptoms Reported: No symptoms reported Additional Gastrointestinal Details: patient denies any pain - states he is following with endocrinology - no further surgeon follow up appointments Gastrointestinal Comment: patient denies any issues with BM    Genitourinary Genitourinary Symptoms Reported: No symptoms reported    Integumentary Integumentary Symptoms Reported: No symptoms reported Additional Integumentary Details: Patient states incisions are all good    Musculoskeletal          Psychosocial           Vitals:   06/09/23 1510  BP: 128/84    Medications Reviewed Today     Reviewed by Sharmaine Dearth, RN (Registered Nurse) on 06/09/23 at 1507  Med List Status: <None>   Medication Order Taking? Sig Documenting Provider Last Dose Status Informant  acetaminophen  (TYLENOL ) 325 MG tablet [id number] Yes Take 325 mg by mouth every 8 (eight) hours as needed for moderate pain (pain score 4-6). [provider] Taking Active   atorvastatin  (LIPITOR) 40 MG tablet [id number] Yes Take 1 tablet (40 mg total) by mouth daily. Charise Companion, MD Taking Active   cetirizine  (ZYRTEC ) 10 MG tablet [id number] No Take 1 tablet (10 mg total) by mouth daily.  Patient not taking: Reported on 06/09/2023   Nanavati, Ankit,  MD Not Taking Active   doxazosin (CARDURA) 1 MG tablet [id number] No Take 1 mg by mouth 2 (two) times daily.  Patient not taking: Reported on 06/09/2023   [provider] Not Taking Active   gabapentin (NEURONTIN) 300 MG capsule [id number] Yes Take 300 mg by mouth 3 (three) times daily. [provider] Taking Active   hydrocortisone (CORTEF) 5 MG tablet  [id number] Yes Take 5 mg by mouth See admin instructions. TAKE 4 TABLETS (20 MG) AT 8 AM AND 2 TABLET (10 MG) AT 3 PM [provider] Taking Active   losartan  (COZAAR ) 100 MG tablet [id number] Yes Take 1 tablet (100 mg total) by mouth daily.  Patient taking differently: Take 50 mg by mouth daily. Decreased to 50mg  per hospital discharge 05/15/23   Charise Companion, MD Taking Active   meclizine  (ANTIVERT ) 25 MG tablet [id number] No Take 1 tablet (25 mg total) by mouth 3 (three) times daily as needed for dizziness (do not use for more than 3days).  Patient not taking: Reported on 06/09/2023   Kandace Organ, NP Not Taking Active   meloxicam  (MOBIC ) 15 MG tablet [id number] No Take 1 tablet by mouth daily.  Patient not taking: Reported on 05/23/2023   [provider] Not Taking Active            Med Note [facility], Hadia Minier A   Tue May 16, 2023  1:17 PM) Patient reports he has not needed  metFORMIN  (GLUCOPHAGE ) 500 MG tablet [id number] Yes Take 0.5 tablets (250 mg total) by mouth 2 (two) times daily with a meal. Azell Boll, MD Taking Active   Multiple Vitamins-Minerals (ONE-A-DAY MENS HEALTH FORMULA PO) [id number] Yes Take 1 tablet by mouth daily. [provider] Taking Active            Med Note [facility], Cayla Wiegand A   Tue May 16, 2023  1:22 PM) Per discharge instruction hold for 1 week after surgery  omeprazole  (PRILOSEC) 40 MG capsule [id number] Yes Take 1 capsule (40 mg total) by mouth 2 (two) times daily. Azell Boll, MD Taking Active   psyllium (METAMUCIL) 58.6 % packet [id number] No Take 1 packet by mouth daily.  Patient not taking: Reported on 06/09/2023   [provider] Not Taking Active   rizatriptan  (MAXALT ) 5 MG tablet [id number] No Take 1-2 tablets (5-10 mg total) by mouth as needed for migraine. May repeat in 2 hours if needed. No more than 30mg  in 24hrs  Patient not taking: Reported on 05/23/2023   Nche, Connye Delaine, NP Not Taking Active            Med  Note [facility], Daymon Hora A   Tue May 16, 2023  1:17 PM) Hasn't needed per patient  senna (SENOKOT) 8.6 MG tablet [id number] No Take 2 tablets by mouth 2 (two) times daily. Wife will pick this up today 05/16/23  Patient not taking: Reported on 05/23/2023   [provider] Not Taking Active   tirzepatide  (MOUNJARO ) 7.5 MG/0.5ML Pen [id number] Yes Inject 7.5 mg into the skin once a week. Charise Companion, MD Taking Active             Recommendation:   Follow with Mds as directed  Follow Up Plan:   Closing From:  Transitions of Care Program  Tonia Frankel RN, CCM [facility]  VBCI-Population Health RN Care Manager [phone]

## 2023-06-15 DIAGNOSIS — R7989 Other specified abnormal findings of blood chemistry: Secondary | ICD-10-CM | POA: Diagnosis not present

## 2023-06-20 ENCOUNTER — Other Ambulatory Visit: Payer: Self-pay | Admitting: Family Medicine

## 2023-06-20 MED ORDER — TIRZEPATIDE 10 MG/0.5ML ~~LOC~~ SOAJ: 10.0000 mg | SUBCUTANEOUS | 2 refills | Status: DC

## 2023-06-22 ENCOUNTER — Other Ambulatory Visit: Payer: Self-pay

## 2023-06-22 NOTE — Patient Instructions (Signed)
 Visit Information  Thank you for taking time to visit with me today. Please don't hesitate to contact me if I can be of assistance to you before our next scheduled appointment.  Your next care management appointment is no further scheduled appointments.      Please call the care guide team at [phone] if you need to cancel, schedule, or reschedule an appointment.   Please call the Suicide and Crisis Lifeline: 988 call the [place]  National Suicide Prevention Lifeline: (716)377-3922 or TTY: 2052722180 TTY 4352964442) to talk to a trained counselor call 1-800-273-TALK (toll free, 24 hour hotline) go to [facility] Urgent Care [address] [place] [phone]) call 911 if you are experiencing a Mental Health or Behavioral Health Crisis or need someone to talk to.  Valora Gear, Florestine Hurl, MHA [place]  Value Based Care Institute Social Worker, Population Health [phone]

## 2023-06-22 NOTE — Patient Outreach (Signed)
 Complex Care Management   Visit Note  06/22/2023  Name:  Derrick Scott MRN: [id number] DOB: 10/02/1975  Situation: Referral received for Complex Care Management related to SDOH Barriers:  Housing rent assistance Financial Resource Strain I obtained verbal consent from Patient.  Visit completed with patient  on the phone  Background:   Past Medical History:  Diagnosis Date   Arthritis    knee bilateral   Asthma    as a child "NO attacks"   Diabetes mellitus without complication (HCC)    border line no medicationa   GERD (gastroesophageal reflux disease)    High cholesterol    Medial meniscus tear 01/2012   right knee   Obesity    Pneumonia    Renal disorder    hematuria,kidney stones   Sleep apnea    Vertigo, benign paroxysmal 2015    Assessment: SW completed a telephone outreach with patient, he states he did receive the resources SW sent Patient states he was able to get help for his bills from family members and has started back working. No other resources are needed at this time.   SDOH Interventions    Flowsheet Row Patient Outreach Telephone from 06/08/2023 in [place] [facility] Telephone from 05/16/2023 in [place] POP[facility] DEPARTMENT Nutrition from 11/17/2022 in [place] [facility]  - A Dept Of [place]. [facility] Office Visit from 04/29/2020 in [facility] - A Dept Of . [facility]  SDOH Interventions      Food Insecurity Interventions -- Intervention Not Indicated Intervention Not Indicated --  Housing Interventions Community Resources Provided AMB Referral -- --  Transportation Interventions -- Intervention Not Indicated -- --  Utilities Interventions -- Intervention Not Indicated -- --  Depression Interventions/Treatment  -- -- -- Counseling  Financial Strain Interventions Community Resources Provided -- -- --       Recommendation:   No recommendations at this time.    Follow Up Plan:   Patient has met all care management goals. Care Management case will be closed. Patient has been provided contact information should new needs arise.   Valora Gear, Florestine Hurl, MHA [place]  [facility] Social Worker, Population Health [phone]

## 2023-06-23 ENCOUNTER — Ambulatory Visit: Admitting: Student

## 2023-06-23 ENCOUNTER — Encounter: Payer: Self-pay | Admitting: Student

## 2023-06-23 VITALS — BP 112/79 | HR 102 | Ht 68.0 in | Wt 284.4 lb

## 2023-06-23 DIAGNOSIS — E896 Postprocedural adrenocortical (-medullary) hypofunction: Secondary | ICD-10-CM

## 2023-06-23 DIAGNOSIS — R1084 Generalized abdominal pain: Secondary | ICD-10-CM | POA: Diagnosis not present

## 2023-06-23 DIAGNOSIS — R197 Diarrhea, unspecified: Secondary | ICD-10-CM

## 2023-06-23 NOTE — Patient Instructions (Signed)
 Mr. Statz, It is so lovely to meet you.  Nothing that you are telling me today is raising any red flags.  I think that we can safely continue to watch and see how you do on the increased dose of steroid from your endocrinologist.  It is certainly possible that your higher dose of Mounjaro  may make some of these symptoms, especially the abdominal pain and nausea, a bit worse.  That said, I do not think that we can chalk all of this up to the Mounjaro . Lets see how you do with a bit more time. Reasons to come back to see us  right away would be: If you stop having bowel movements or passing gas, if you start having a fever, if the pain becomes dramatically worse or localized, or if the nausea becomes unbearable.  Otherwise, I would recommend that you touch base with your endocrinologist after being on the increased dose for a week or so and let them know how you are feeling.  They can determine whether they need to recheck your hormones or not before your visit in July.  Alexa Andrews, MD

## 2023-06-24 NOTE — Progress Notes (Signed)
    SUBJECTIVE:   CHIEF COMPLAINT / HPI:   Derrick Scott is a 48 year old male with adrenal insufficiency after adrenalectomy for pheochromocytoma about 6 weeks ago who presents with nausea and abdominal discomfort.  He underwent adrenal surgery on April 25th and has experienced nausea and abdominal discomfort since then. The abdominal pain is described as a cramping, pinching sensation deep in the belly, primarily on the left side but more or less all over, and worsens with pressure and movement, such as wearing a belt during work activities.  He has been taking hydrocortisone for adrenal insufficiency, initially at a dose of 20 mg in the morning and 10 mg in the afternoon. Due to nausea, the dose was increased to 20 mg twice daily just yesterday.   He recently had an increase in his Mounjaro  dosage, which he administered on Wednesday, but his symptoms began prior to this change. A normal CT scan post-surgery was noted during an ED visit earlier in May.  He reports a change in bowel habits, initially experiencing constipation followed by diarrhea, impacting his ability to [place]. 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  No fever. Normal bowel sounds. He experiences nausea, abdominal cramping, and diarrhea.     OBJECTIVE:   BP 112/79   Pulse (!) 102   Ht 5\' 8"  (1.727 m)   Wt 284 lb 6.4 oz (129 kg)   SpO2 98%   BMI 43.24 kg/m   Physical Exam Constitutional:      General: He is not in acute distress.    Appearance: Normal appearance.  Cardiovascular:     Rate and Rhythm: Normal rate and regular rhythm.     Heart sounds: No murmur heard. Pulmonary:     Effort: Pulmonary effort is normal.  Abdominal:     General: A surgical scar is present. Bowel sounds are normal. There is no distension. Abdominal bruit: well healed.    Palpations: Abdomen is soft. There is no fluid wave.     Tenderness:  There is no abdominal tenderness. There is no guarding.     Hernia: No hernia is present.  Neurological:     Mental Status: He is alert.      ASSESSMENT/PLAN:   Assessment & Plan  Generalized Abdominal Pain Nausea, stomach discomfort, and cramping pain post-adrenal surgery. CT scan post operatively normal, no abscess or fluid collection. Benign exam today. Differential includes hormonal issue, viral illness, vs normal post-op course. Unlikely bowel obstruction due to diarrhea and normal bowel sounds.  - Monitor symptoms and assess response to increased steroid dose. - Advised to touch base with his endocrinologist after one week on increased hydrocortisone dose. - Return to clinic if symptoms worsen, such as cessation of bowel movements, fever, localized pain, or unbearable nausea.  Adrenal Insufficiency Adrenal insufficiency post-surgery, on hydrocortisone replacement. Endocrinologist adjusted dosage due to nausea. Follow-up scheduled for adrenal function assessment. - Continue current hydrocortisone regimen as per endocrinologist's instructions. - As this is being managed by endocrine, I do not see a role for us  repeating hormone levels in clinic today   Diarrhea Recent diarrhea, possibly hormonal vs viral vs drug related as these symptoms do seem to have started after the Mounjaro  dose increase. No fever or severe pain.  - Monitor symptoms and maintain hydration. - Return to clinic if diarrhea persists or worsens.   Alexa Andrews, MD [facility]

## 2023-06-25 DIAGNOSIS — G4733 Obstructive sleep apnea (adult) (pediatric): Secondary | ICD-10-CM | POA: Diagnosis not present

## 2023-07-26 DIAGNOSIS — G4733 Obstructive sleep apnea (adult) (pediatric): Secondary | ICD-10-CM | POA: Diagnosis not present

## 2023-07-28 ENCOUNTER — Ambulatory Visit (INDEPENDENT_AMBULATORY_CARE_PROVIDER_SITE_OTHER): Admitting: Family Medicine

## 2023-07-28 VITALS — BP 131/82 | HR 98 | Ht 68.0 in | Wt 286.0 lb

## 2023-07-28 DIAGNOSIS — M25562 Pain in left knee: Secondary | ICD-10-CM

## 2023-07-28 NOTE — Progress Notes (Signed)
    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  Was sitting and driving when he noticed the pain Drives an automatic, does not use a clutch  Denies fevers, falls, concern for STD, denies history of gout  Using Tylenol  as needed, avoiding NSAIDs due to history of adrenalectomy, on chronic steroids  PERTINENT  PMH / PSH: HTN, DM, adrenal insufficiency after adrenalectomy for pheochromocytoma  OBJECTIVE:   BP 131/82   Pulse 98   Ht 5' 8 (1.727 m)   Wt 286 lb (129.7 kg)   BMI 43.49 kg/m   General: NAD, pleasant, able to participate in exam Respiratory: No respiratory distress Skin: warm and dry, no rashes noted Psych: Normal affect and mood  MSK: Left knee: Inspection: Effusion noted.  No gross deformity or ecchymoses Palpation: Nontender to palpation throughout anterior, medial/lateral, posterior knee. ROM: FROM active and passive Strength: 5/5 Special tests: Negative Apley's, varus/valgus, intra-/posterior drawers.  No significant laxity  Limping slightly  Lower extremity below the knee without significant edema, erythema, warmth, calf tenderness  Point-of-care ultrasound performed of the left knee and showed effusion in suprapatellar space with questionable edema around medial meniscus.  No obvious posterior popliteal cyst or edema noted  ASSESSMENT/PLAN:   Assessment & Plan Acute pain of left knee Unclear etiology Low suspicion for DVT Notable effusion on exam, also noted on POCUS with questionable involvement of medial joint structures. Although no traumatic incident with nonfocal exam findings so unclear what would cause this  For now, continue conservative  therapy with compression, ice, and tylenol   Placed referral to sports medicine for more formal evaluation/ultrasound   Payton Coward, MD [facility]

## 2023-07-28 NOTE — Patient Instructions (Addendum)
 I placed a referral to sports medicine  You should receive a call within the next couple of weeks schedule this appointment  In the meantime I recommend using a wrap to help compress the knee  Please seek medical attention if you have any fevers, redness or warmth or worsening swelling of your knee, are unable to move it at all  Continue tylenol  as needed for pain

## 2023-08-04 DIAGNOSIS — G4733 Obstructive sleep apnea (adult) (pediatric): Secondary | ICD-10-CM | POA: Diagnosis not present

## 2023-08-04 DIAGNOSIS — E2740 Unspecified adrenocortical insufficiency: Secondary | ICD-10-CM | POA: Diagnosis not present

## 2023-08-04 DIAGNOSIS — D3501 Benign neoplasm of right adrenal gland: Secondary | ICD-10-CM | POA: Diagnosis not present

## 2023-08-04 DIAGNOSIS — I10 Essential (primary) hypertension: Secondary | ICD-10-CM | POA: Diagnosis not present

## 2023-08-04 DIAGNOSIS — E785 Hyperlipidemia, unspecified: Secondary | ICD-10-CM | POA: Diagnosis not present

## 2023-08-04 DIAGNOSIS — Z79899 Other long term (current) drug therapy: Secondary | ICD-10-CM | POA: Diagnosis not present

## 2023-08-04 DIAGNOSIS — E119 Type 2 diabetes mellitus without complications: Secondary | ICD-10-CM | POA: Diagnosis not present

## 2023-08-08 DIAGNOSIS — M7661 Achilles tendinitis, right leg: Secondary | ICD-10-CM | POA: Diagnosis not present

## 2023-08-08 DIAGNOSIS — M79671 Pain in right foot: Secondary | ICD-10-CM | POA: Diagnosis not present

## 2023-08-14 ENCOUNTER — Ambulatory Visit (INDEPENDENT_AMBULATORY_CARE_PROVIDER_SITE_OTHER): Admitting: Family Medicine

## 2023-08-14 VITALS — BP 134/87 | Ht 68.0 in | Wt 275.0 lb

## 2023-08-14 DIAGNOSIS — M79671 Pain in right foot: Secondary | ICD-10-CM | POA: Diagnosis not present

## 2023-08-14 NOTE — Progress Notes (Unsigned)
 PCP: Rosalynn Camie CROME, MD  Subjective:   HPI: Patient is a 48 y.o. male here for right foot pain.  Patient is here with about a week of right heel and medial foot pain that started as he was walking at work.  No specific injury or trauma to the area.  He states that it is painful in his heel and the medial aspect of his foot when he walks, with some pain also present on the plantar aspect of his foot.  He states that the pain is about 8 or 9 on a pain scale, and is mainly localized to the area.  Pain is sharp in nature.  Has tried icing, but not any medications as patient cannot take NSAIDs and does not want to just mask the pain with Tylenol .  Patient did visit the urgent care and get x-rays and was told he has bone spurs that are causing his pain.  X-ray results reviewed from urgent care show some mild tibiotalar and talonavicular degenerative changes, remote unfused medial malleolar chronic fracture fragments, and posterior and plantar calcaneal enthesopathy.  Unable to review imaging personally as it is not updated in his chart.  Patient was prescribed Medrol  Dosepak at the urgent care, however given his history of pheochromocytoma and adrenal lesion, patient would like to verify with his endocrinologist before starting any steroids.  Currently on hydrocortisone 20 mg a.m. and 10 mg p.m.  Endocrinology and urgent care notes personally reviewed.  Past Medical History:  Diagnosis Date   Arthritis    knee bilateral   Asthma    as a child NO attacks   Diabetes mellitus without complication (HCC)    border line no medicationa   GERD (gastroesophageal reflux disease)    High cholesterol    Medial meniscus tear 01/2012   right knee   Obesity    Pneumonia    Renal disorder    hematuria,kidney stones   Sleep apnea    Vertigo, benign paroxysmal 2015    Current Outpatient Medications on File Prior to Visit  Medication Sig Dispense Refill   acetaminophen  (TYLENOL ) 325 MG tablet Take 325 mg by  mouth every 8 (eight) hours as needed for moderate pain (pain score 4-6).     atorvastatin  (LIPITOR) 40 MG tablet Take 1 tablet (40 mg total) by mouth daily. 90 tablet 3   cetirizine  (ZYRTEC ) 10 MG tablet Take 1 tablet (10 mg total) by mouth daily. (Patient not taking: Reported on 06/09/2023) 10 tablet 0   doxazosin (CARDURA) 1 MG tablet Take 1 mg by mouth 2 (two) times daily. (Patient not taking: Reported on 06/09/2023)     gabapentin (NEURONTIN) 300 MG capsule Take 300 mg by mouth 3 (three) times daily.     hydrocortisone (CORTEF) 5 MG tablet Take 5 mg by mouth See admin instructions. TAKE 4 TABLETS (20 MG) AT 8 AM AND 2 TABLET (10 MG) AT 3 PM     losartan  (COZAAR ) 100 MG tablet Take 1 tablet (100 mg total) by mouth daily. (Patient taking differently: Take 50 mg by mouth daily. Decreased to 50mg  per hospital discharge 05/15/23) 90 tablet 3   meclizine  (ANTIVERT ) 25 MG tablet Take 1 tablet (25 mg total) by mouth 3 (three) times daily as needed for dizziness (do not use for more than 3days). (Patient not taking: Reported on 06/09/2023) 21 tablet 0   meloxicam  (MOBIC ) 15 MG tablet Take 1 tablet by mouth daily. (Patient not taking: Reported on 05/23/2023)  metFORMIN  (GLUCOPHAGE ) 500 MG tablet Take 0.5 tablets (250 mg total) by mouth 2 (two) times daily with a meal. 90 tablet 3   Multiple Vitamins-Minerals (ONE-A-DAY MENS HEALTH FORMULA PO) Take 1 tablet by mouth daily.     omeprazole  (PRILOSEC) 40 MG capsule Take 1 capsule (40 mg total) by mouth 2 (two) times daily. 180 capsule 1   psyllium (METAMUCIL) 58.6 % packet Take 1 packet by mouth daily. (Patient not taking: Reported on 06/09/2023)     rizatriptan  (MAXALT ) 5 MG tablet Take 1-2 tablets (5-10 mg total) by mouth as needed for migraine. May repeat in 2 hours if needed. No more than 30mg  in 24hrs (Patient not taking: Reported on 05/23/2023) 10 tablet 0   senna (SENOKOT) 8.6 MG tablet Take 2 tablets by mouth 2 (two) times daily. Wife will pick this up today  05/16/23 (Patient not taking: Reported on 05/23/2023)     tirzepatide  (MOUNJARO ) 10 MG/0.5ML Pen Inject 10 mg into the skin once a week. 2 mL 2   No current facility-administered medications on file prior to visit.    Past Surgical History:  Procedure Laterality Date   ADRENALECTOMY     CORNEAL TRANSPLANT Left 12/17/2009   KNEE ARTHROSCOPY  02/20/2012   Procedure: ARTHROSCOPY KNEE;  Surgeon: Norleen LITTIE Gavel, MD;  Location: [facility];  Service: Orthopedics;  Laterality: Right;  Chondroplasia patella femoral joint, medial meniscal debriedment    Allergies  Allergen Reactions   Shrimp [Shellfish Allergy] Anaphylaxis    There were no vitals taken for this visit.      No data to display              No data to display              Objective:  Physical Exam:  Gen: NAD, comfortable in exam room MSK: Right foot/ankle: No asymmetry, swelling, erythema in ankle.  Pes planus. No pain with palpation of the fibula, interosseous membrane, anterior joint line.  Patient tender to palpation at the insertion of the Achilles tendon at the posterior calcaneus, also tender along his tibialis posterior tendon.  Also has some tenderness along sinus tarsi.  Full range of motion with dorsiflexion, plantarflexion, inversion, eversion.  5 out of 5 strength with resisted dorsiflexion, plantarflexion, eversion, and inversion.  Negative anterior drawer, negative Thompson test.   Assessment & Plan:  1.  Tibialis posterior tendinitis 2.  Achilles tendinitis Will proceed with home physical therapy, and will provide insoles with heel lifts and scaphoid pads. Counseled that this could take up to 4 to 6 weeks to see improvement. Recommend Voltaren  gel given patient cannot use NSAIDs.

## 2023-08-14 NOTE — Patient Instructions (Signed)
 You have Achilles Tendinopathy and posterior tibialis tendinopathy Topical voltaren  gel or biofreeze up to 4 times a day as needed. Tylenol  500mg  1-2 tabs three times a day as needed. Do home exercises daily as directed. Icing 15 minutes at a time 3-4 times a day. Avoid uneven ground, hills as much as possible. Sports insoles with scaphoid pads and heel lifts are important. Avoid flat shoes, barefoot walking. Consider physical therapy, nitro patches, shockwave therapy if not improving as expected. Follow up in 6 weeks.

## 2023-08-15 ENCOUNTER — Encounter: Payer: Self-pay | Admitting: Family Medicine

## 2023-08-25 ENCOUNTER — Other Ambulatory Visit: Payer: Self-pay

## 2023-08-25 ENCOUNTER — Ambulatory Visit (INDEPENDENT_AMBULATORY_CARE_PROVIDER_SITE_OTHER): Admitting: Family Medicine

## 2023-08-25 VITALS — BP 128/73 | Ht 68.0 in | Wt 275.0 lb

## 2023-08-25 DIAGNOSIS — M25562 Pain in left knee: Secondary | ICD-10-CM

## 2023-08-25 NOTE — Progress Notes (Signed)
 PCP: Rosalynn Camie CROME, MD  Subjective:   HPI: Patient is a 48 y.o. male here for evaluation of left knee pain.  Patient reports that he was at the gym on 08/22/2023 doing squatting exercises when he experienced a sharp pain in his knee when coming up. Since then, he has been experiencing a dull throbbing pain along the inside and back of his knee. He also feels tightness when fully flexing the knee. He has pain with walking that worsens over the course of the day, he also occasionally feels as though the knee is popping/catching. He has been treating at home with ice and Tylenol . He denies any history of injury to his left knee.   Past Medical History:  Diagnosis Date   Arthritis    knee bilateral   Asthma    as a child NO attacks   Diabetes mellitus without complication (HCC)    border line no medicationa   GERD (gastroesophageal reflux disease)    High cholesterol    Medial meniscus tear 01/2012   right knee   Obesity    Pneumonia    Renal disorder    hematuria,kidney stones   Sleep apnea    Vertigo, benign paroxysmal 2015    Current Outpatient Medications on File Prior to Visit  Medication Sig Dispense Refill   acetaminophen  (TYLENOL ) 325 MG tablet Take 325 mg by mouth every 8 (eight) hours as needed for moderate pain (pain score 4-6).     atorvastatin  (LIPITOR) 40 MG tablet Take 1 tablet (40 mg total) by mouth daily. 90 tablet 3   cetirizine  (ZYRTEC ) 10 MG tablet Take 1 tablet (10 mg total) by mouth daily. (Patient not taking: Reported on 06/09/2023) 10 tablet 0   doxazosin (CARDURA) 1 MG tablet Take 1 mg by mouth 2 (two) times daily. (Patient not taking: Reported on 06/09/2023)     gabapentin (NEURONTIN) 300 MG capsule Take 300 mg by mouth 3 (three) times daily.     hydrocortisone (CORTEF) 5 MG tablet Take 5 mg by mouth See admin instructions. TAKE 4 TABLETS (20 MG) AT 8 AM AND 2 TABLET (10 MG) AT 3 PM     losartan  (COZAAR ) 100 MG tablet Take 1 tablet (100 mg total) by mouth  daily. (Patient taking differently: Take 50 mg by mouth daily. Decreased to 50mg  per hospital discharge 05/15/23) 90 tablet 3   meclizine  (ANTIVERT ) 25 MG tablet Take 1 tablet (25 mg total) by mouth 3 (three) times daily as needed for dizziness (do not use for more than 3days). (Patient not taking: Reported on 06/09/2023) 21 tablet 0   meloxicam  (MOBIC ) 15 MG tablet Take 1 tablet by mouth daily. (Patient not taking: Reported on 05/23/2023)     metFORMIN  (GLUCOPHAGE ) 500 MG tablet Take 0.5 tablets (250 mg total) by mouth 2 (two) times daily with a meal. 90 tablet 3   Multiple Vitamins-Minerals (ONE-A-DAY MENS HEALTH FORMULA PO) Take 1 tablet by mouth daily.     omeprazole  (PRILOSEC) 40 MG capsule Take 1 capsule (40 mg total) by mouth 2 (two) times daily. 180 capsule 1   psyllium (METAMUCIL) 58.6 % packet Take 1 packet by mouth daily. (Patient not taking: Reported on 06/09/2023)     rizatriptan  (MAXALT ) 5 MG tablet Take 1-2 tablets (5-10 mg total) by mouth as needed for migraine. May repeat in 2 hours if needed. No more than 30mg  in 24hrs (Patient not taking: Reported on 05/23/2023) 10 tablet 0   senna (SENOKOT) 8.6 MG tablet  Take 2 tablets by mouth 2 (two) times daily. Wife will pick this up today 05/16/23 (Patient not taking: Reported on 05/23/2023)     tirzepatide  (MOUNJARO ) 10 MG/0.5ML Pen Inject 10 mg into the skin once a week. 2 mL 2   No current facility-administered medications on file prior to visit.    Past Surgical History:  Procedure Laterality Date   ADRENALECTOMY     CORNEAL TRANSPLANT Left 12/17/2009   KNEE ARTHROSCOPY  02/20/2012   Procedure: ARTHROSCOPY KNEE;  Surgeon: Norleen LITTIE Gavel, MD;  Location: [facility];  Service: Orthopedics;  Laterality: Right;  Chondroplasia patella femoral joint, medial meniscal debriedment    Allergies  Allergen Reactions   Shrimp [Shellfish Allergy] Anaphylaxis    BP 128/73   Ht 5' 8 (1.727 m)   Wt 275 lb (124.7 kg)   BMI 41.81 kg/m        No data to display              No data to display              Objective:  Physical Exam:  Gen: NAD, comfortable in exam room  MSK: Left knee Inspection: No appreciable bony abnormalities, there is mild swelling compared to the right knee.  Palpation: There is tenderness to palpation along the medial and posterior aspects of the knee. No overt tenderness to palpation along the joint line, quadriceps, patella, or pattellar tendon ROM: FROM with knee extension and flexion, though painful with full flexion Strength: 5/5 with knee extension/flexion, hip flexion, and plantar/dorsiflexion  Neuro/Vasc: Neurovascularly intact distally  Special Tests: Lachman negative, anterior/posterior drawer test negative, no ligamentous laxity appreciated with valgus/varus stress, patella apprehension test negative, patella grind test positive, McMurray's test negative, Apley compression test positive  Ultrasound Left Knee: Revealed a moderate fluid collection in the left knee. No appreciable meniscal tear seen.    Assessment & Plan:  1. Acute left knee pain - Patient's acute left knee pain is most consistent with a meniscal injury given the mechanism of his injury and physical exam findings in clinic today. US  revealed a moderate fluid collection but no meniscal tear was visualized. Advised patient to rest his left knee for at least two weeks and to use ice and Tylenol  as needed. Additionally, counseled patient to avoid squatting beyond 90 degrees or to use different exercises like lunges to for leg strengthening.   - Limit activity and rest knee for 2 weeks  - Tylenol  and ice as needed  - Avoid squatting beyond 90 degrees and try different exercises for strengthening legs

## 2023-08-26 DIAGNOSIS — G4733 Obstructive sleep apnea (adult) (pediatric): Secondary | ICD-10-CM | POA: Diagnosis not present

## 2023-08-30 ENCOUNTER — Encounter: Payer: Self-pay | Admitting: Family Medicine

## 2023-09-07 DIAGNOSIS — J0110 Acute frontal sinusitis, unspecified: Secondary | ICD-10-CM | POA: Diagnosis not present

## 2023-09-08 ENCOUNTER — Ambulatory Visit: Admitting: Family Medicine

## 2023-09-11 ENCOUNTER — Other Ambulatory Visit: Payer: Self-pay | Admitting: Family Medicine

## 2023-09-11 MED ORDER — TIRZEPATIDE 10 MG/0.5ML ~~LOC~~ SOAJ: 10.0000 mg | SUBCUTANEOUS | 5 refills | Status: AC

## 2023-09-13 ENCOUNTER — Ambulatory Visit: Payer: BC Managed Care – PPO | Admitting: Internal Medicine

## 2023-09-13 NOTE — Progress Notes (Deleted)
 Name: Derrick Scott  MRN/ DOB: [id number], 03/03/1975    Age/ Sex: 48 y.o., male    PCP: Rosalynn Camie CROME, MD   Reason for Endocrinology Evaluation: Right adrenal adenoma     Date of Initial Endocrinology Evaluation: 09/13/2022    HPI: [name] is a 48 y.o. male with a past medical history of DM, Dyslipidemia and OSA. The patient presented for initial endocrinology clinic visit on 09/13/2022 for consultative assistance with his right adrenal adenoma.   Patient was noted with a right adrenal adenoma on imaging from 2014.  The adrenal gland has been gradually increasing in size from 1.2 cm in 2014 to 2.2 cm in 2022.   His labs showed elevated 24-hour urinary norepinephrine at 479 mcg (reference 15-100) , with normal 24-hour dopamine in September, 2024 Plasma labs showed norepinephrine of 1109 PG/mL, normal plasma epinephrine at 13 and dopamine 17 PG/mL in September, 2024, he was advised to start phenoxybenzamine 2 weeks prior to adrenal surgery and was referred for surgical intervention.  He is s/p Right pheochromocytoma resection 05/30/2023,  with postop adrenal insufficiency on hydrocortisone Surgical pathology confirmed 2.8 cm pheochromocytoma which was completely excised.  Post operative day 1, 8 AM cortisol was surprisingly abnormal with concomitant low ACTH  at 3.  Cortisol 1.8, which was considered highly and expected considering his normal 24-hour urinary cortisol and 1 mg dexamethasone  suppression test.  He was started on hydrocortisone 20 mg every morning and 10 mg every afternoon on April, 26th, 2025  Postop metanephrines were normal   No recent systemic steroids     Cosyntropin  stimulation test in July, 2025 with a baseline cortisol of 7.2 micrograms/dL, and a 39-[id number] cortisol level of 12.9 micrograms/dL   SUBJECTIVE:    Today (09/13/23):  Derrick Scott is here for follow-up of pheochromocytoma resection and postop adrenal  insufficiency.      Medication Hydrocortisone 15 mg every morning and 5 mg in the afternoon      HISTORY:  Past Medical History:  Past Medical History:  Diagnosis Date   Arthritis    knee bilateral   Asthma    as a child NO attacks   Diabetes mellitus without complication (HCC)    border line no medicationa   GERD (gastroesophageal reflux disease)    High cholesterol    Medial meniscus tear 01/2012   right knee   Obesity    Pneumonia    Renal disorder    hematuria,kidney stones   Sleep apnea    Vertigo, benign paroxysmal 2015   Past Surgical History:  Past Surgical History:  Procedure Laterality Date   ADRENALECTOMY     CORNEAL TRANSPLANT Left 12/17/2009   KNEE ARTHROSCOPY  02/20/2012   Procedure: ARTHROSCOPY KNEE;  Surgeon: Norleen CROME Gavel, MD;  Location: [facility];  Service: Orthopedics;  Laterality: Right;  Chondroplasia patella femoral joint, medial meniscal debriedment    Social History:  reports that he has never smoked. He has never used smokeless tobacco. He reports that he does not drink alcohol and does not use drugs. Family History: family history includes Asthma in his mother; Diabetes in his father and sister; Osteoarthritis in his sister; Other in his mother; Prostate cancer in his father.   HOME MEDICATIONS: Allergies as of 09/13/2023       Reactions   Shrimp [shellfish Allergy] Anaphylaxis        Medication List        Accurate as of September 13, 2023  7:08 AM. If you have any questions, ask your nurse or doctor.          acetaminophen  325 MG tablet Commonly known as: TYLENOL  Take 325 mg by mouth every 8 (eight) hours as needed for moderate pain (pain score 4-6).   atorvastatin  40 MG tablet Commonly known as: LIPITOR Take 1 tablet (40 mg total) by mouth daily.   cetirizine  10 MG tablet Commonly known as: ZYRTEC  Take 1 tablet (10 mg total) by mouth daily.   doxazosin 1 MG tablet Commonly known as: CARDURA Take  1 mg by mouth 2 (two) times daily.   gabapentin 300 MG capsule Commonly known as: NEURONTIN Take 300 mg by mouth 3 (three) times daily.   hydrocortisone 5 MG tablet Commonly known as: CORTEF Take 5 mg by mouth See admin instructions. TAKE 4 TABLETS (20 MG) AT 8 AM AND 2 TABLET (10 MG) AT 3 PM   losartan  100 MG tablet Commonly known as: COZAAR  Take 1 tablet (100 mg total) by mouth daily. What changed:  how much to take additional instructions   meclizine  25 MG tablet Commonly known as: ANTIVERT  Take 1 tablet (25 mg total) by mouth 3 (three) times daily as needed for dizziness (do not use for more than 3days).   meloxicam  15 MG tablet Commonly known as: MOBIC  Take 1 tablet by mouth daily.   metFORMIN  500 MG tablet Commonly known as: GLUCOPHAGE  Take 0.5 tablets (250 mg total) by mouth 2 (two) times daily with a meal.   omeprazole  40 MG capsule Commonly known as: PRILOSEC Take 1 capsule (40 mg total) by mouth 2 (two) times daily.   ONE-A-DAY MENS HEALTH FORMULA PO Take 1 tablet by mouth daily.   psyllium 58.6 % packet Commonly known as: METAMUCIL Take 1 packet by mouth daily.   rizatriptan  5 MG tablet Commonly known as: MAXALT  Take 1-2 tablets (5-10 mg total) by mouth as needed for migraine. May repeat in 2 hours if needed. No more than 30mg  in 24hrs   senna 8.6 MG tablet Commonly known as: SENOKOT Take 2 tablets by mouth 2 (two) times daily. Wife will pick this up today 05/16/23   tirzepatide  10 MG/0.5ML Pen Commonly known as: MOUNJARO  Inject 10 mg into the skin once a week.          REVIEW OF SYSTEMS: A comprehensive ROS was conducted with the patient and is negative except as per HPI    OBJECTIVE:  VS: There were no vitals taken for this visit.   Wt Readings from Last 3 Encounters:  08/25/23 275 lb (124.7 kg)  08/14/23 275 lb (124.7 kg)  07/28/23 286 lb (129.7 kg)     EXAM: General: Pt appears well and is in NAD  Eyes: External eye exam normal  without stare, lid lag or exophthalmos.  EOM intact.  PERRL.  Neck: General: Supple without adenopathy. Thyroid : Thyroid  size normal.  No goiter or nodules appreciated. No thyroid  bruit.  Lungs: Clear with good BS bilat   Heart: Auscultation: RRR.  Abdomen: Soft, nontender  Extremities:  BL LE: No pretibial edema   Mental Status: Judgment, insight: Intact Orientation: Oriented to time, place, and person Mood and affect: No depression, anxiety, or agitation     DATA REVIEWED:  Latest Reference Range & Units 09/13/22 10:37  Sodium 135 - 145 mEq/L 138  Potassium 3.5 - 5.1 mEq/L 4.0  Chloride 96 - 112 mEq/L 101  CO2 19 - 32 mEq/L 30  Glucose  70 - 99 mg/dL 880 (H)  BUN 6 - 23 mg/dL 12  Creatinine 9.59 - 8.49 mg/dL 8.80  Calcium  8.4 - 10.5 mg/dL 8.9  Alkaline Phosphatase 39 - 117 U/L 83  Albumin 3.5 - 5.2 g/dL 4.1  AST 0 - 37 U/L 20  ALT 0 - 53 U/L 30  Total Protein 6.0 - 8.3 g/dL 7.9  Total Bilirubin 0.2 - 1.2 mg/dL 0.5  GFR >39.99 mL/min 72.30  (H): Data is abnormally high    CT renal 01/28/2020 Adrenals/Urinary Tract: Intermediate density right adrenal nodule which is stable in size in comparison to most recent prior measuring 2.2 cm (series 2, image 23) but has been slowly increasing over multiple years for instance with measured 1.4 cm on May 29, 2013, 1.2 cm on September 29, 2012 and was not present on May 04, 2009. Favor a benign lipid poor adenoma. Kidneys are normal, without renal calculi focal lesion or hydronephrosis. The bladder is predominantly decompressed but otherwise appears normal.   Old records , labs and images have been reviewed.     ASSESSMENT/PLAN/RECOMMENDATIONS:   Right adrenal adenoma:    - Eighty five percent of adrenal adenomas are nonsecretory.  - Three forms of adrenal hyperfunction should be considered in patients with adrenal incidentaloma  Glucocorticoid hypersecretion Primary hyperaldosteronism Pheochromocytoma   -Will  proceed with 24-hour urine collection for cortisol and metanephrines -Will proceed with Aldo, renin - BMP today is normal , awaiting on the rest   Follow-up in 1 year  Signed electronically by: Stefano Redgie Butts, MD  [facility]  [facility] [address] [place], [place] [address] Phone: [phone] FAX: [phone]   CC: Rosalynn Camie CROME, MD [address] [place] [address] Phone: [phone] Fax: [phone]   Return to Endocrinology clinic as below: Future Appointments  Date Time Provider Department Center  09/13/2023  9:50 AM Ruthie Berch, Donell Redgie, MD [facility] None  09/25/2023  4:15 PM Cleatrice Ludie SAUNDERS, MD [facility]  10/19/2023  3:00 PM Sherryl Bouchard, NP [facility] None

## 2023-09-25 ENCOUNTER — Encounter: Payer: Self-pay | Admitting: Family Medicine

## 2023-09-25 ENCOUNTER — Ambulatory Visit: Admitting: Family Medicine

## 2023-09-25 VITALS — BP 92/78 | HR 91 | Temp 98.8°F | Ht 68.0 in | Wt 286.2 lb

## 2023-09-25 DIAGNOSIS — E896 Postprocedural adrenocortical (-medullary) hypofunction: Secondary | ICD-10-CM | POA: Diagnosis not present

## 2023-09-25 DIAGNOSIS — D3500 Benign neoplasm of unspecified adrenal gland: Secondary | ICD-10-CM

## 2023-09-25 DIAGNOSIS — I10 Essential (primary) hypertension: Secondary | ICD-10-CM

## 2023-09-25 DIAGNOSIS — R350 Frequency of micturition: Secondary | ICD-10-CM

## 2023-09-25 DIAGNOSIS — E1165 Type 2 diabetes mellitus with hyperglycemia: Secondary | ICD-10-CM | POA: Diagnosis not present

## 2023-09-25 DIAGNOSIS — Z1159 Encounter for screening for other viral diseases: Secondary | ICD-10-CM | POA: Diagnosis not present

## 2023-09-25 LAB — POCT URINALYSIS DIP (MANUAL ENTRY)
Bilirubin, UA: NEGATIVE
Blood, UA: NEGATIVE
Glucose, UA: NEGATIVE mg/dL
Leukocytes, UA: NEGATIVE
Nitrite, UA: NEGATIVE
Protein Ur, POC: NEGATIVE mg/dL
Spec Grav, UA: 1.02 (ref 1.010–1.025)
Urobilinogen, UA: 0.2 U/dL
pH, UA: 5.5 (ref 5.0–8.0)

## 2023-09-25 LAB — POCT GLYCOSYLATED HEMOGLOBIN (HGB A1C): HbA1c, POC (controlled diabetic range): 5.6 % (ref 0.0–7.0)

## 2023-09-25 MED ORDER — LOSARTAN POTASSIUM 50 MG PO TABS: 25.0000 mg | ORAL_TABLET | Freq: Every day | ORAL | Status: AC

## 2023-09-25 NOTE — Patient Instructions (Addendum)
 It was great to see you!  Our plans for today:  - Decrease your losartan  to 25mg . Monitor your blood pressure at home and keep a log of your readings. Make sure to be seated for at least 5 minutes prior to testing and not in pain or worked up for the most accurate readings. Bring this log with you to follow up. If your blood pressure is consistently in the low 100s for the top number, after decreasing losartan , STOP losartan .  - Come back in 1 week for follow up. Bring your log with you.   We are checking some labs today, we will release these results to your MyChart.  Take care and seek immediate care sooner if you develop any concerns.   Dr. Devinn Hurwitz

## 2023-09-25 NOTE — Assessment & Plan Note (Addendum)
 Obtain labs, A1c and UA. Possible steroids increasing blood sugar contributing. Counsled on avoiding bladder irritants. F/u next week.

## 2023-09-25 NOTE — Progress Notes (Signed)
   SUBJECTIVE:   CHIEF COMPLAINT / HPI:   Discussed the use of AI scribe software for clinical note transcription with the patient, who gave verbal consent to proceed.  History of Present Illness Derrick Scott is a 48 year old male with a history of adrenalectomy due to pheochromocytoma who presents with frequent urination and lower blood pressure.  Urinary frequency - Frequent urination every 5 to 10 minutes - No dysuria or urgency leading to incontinence - Urine occasionally appears dark, possibly related to tea consumption - denies penile discharge, dysuria. - endorsing some lower back pain and suprapubic pressure - h/o diabetes, last A1c 6.1. doing well on mounjaro  and metformin   Adrenal insufficiency and blood pressure management - History of adrenalectomy for pheochromocytoma - Takes hydrocortisone and losartan  50 mg daily - Home blood pressure readings range from 119/86 to 128/87 mmHg - Episodes of lightheadedness and faintness occurred three weeks ago - Rescue pen available for low blood pressure emergencies - works for [employer] and [place], concerned about driving with low BP.   Elevated LFT - elevated on last check, needs recheck   Pertient PMH: adrenal insufficiency s/p resection for pheochromocytoma on hydrocortisone, T2DM, HTN, HLD, obesity, OSA  OBJECTIVE:   BP 92/78   Pulse 91   Temp 98.8 F (37.1 C)   Ht 5' 8 (1.727 m)   Wt 286 lb 3.2 oz (129.8 kg)   SpO2 100%   BMI 43.52 kg/m   Gen: well appearing, in NAD Card: RRR Lungs: CTAB Abd: soft, NTND, +BS Ext: WWP, no edema  ASSESSMENT/PLAN:   Problem List Items Addressed This Visit       Cardiovascular and Mediastinum   Hypertension - Primary   Lower and on recheck. Likely 2/2 adrenal insufficiency. Decrease losartan , if remains below goal on home checks, discontinue. F/u next week with home BP log.       Relevant Medications   losartan  (COZAAR ) 50 MG  tablet   Other Relevant Orders   Microalbumin/Creatinine Ratio, Urine   Comprehensive metabolic panel with GFR   POCT urinalysis dipstick (Completed)     Endocrine   Adrenal insufficiency after adrenalectomy (HCC)   Likely contributing to lower BP. Antihypertensive management as above. Continue to follow with Endo.      Relevant Orders   Comprehensive metabolic panel with GFR   DM (diabetes mellitus) (HCC)   A1c and UACR today.      Relevant Medications   losartan  (COZAAR ) 50 MG tablet   Other Relevant Orders   HgB A1c (Completed)   Microalbumin/Creatinine Ratio, Urine   Comprehensive metabolic panel with GFR   POCT urinalysis dipstick (Completed)     Other   Pheochromocytoma   Urinary frequency   Obtain labs, A1c and UA. Possible steroids increasing blood sugar contributing. Counsled on avoiding bladder irritants. F/u next week.       Relevant Orders   HgB A1c (Completed)   POCT urinalysis dipstick (Completed)   Other Visit Diagnoses       Need for hepatitis C screening test       Relevant Orders   Hepatitis C antibody           Donald CHRISTELLA Lai, DO

## 2023-09-25 NOTE — Assessment & Plan Note (Signed)
 Lower and on recheck. Likely 2/2 adrenal insufficiency. Decrease losartan , if remains below goal on home checks, discontinue. F/u next week with home BP log.

## 2023-09-25 NOTE — Assessment & Plan Note (Signed)
 A1c and UACR today.

## 2023-09-25 NOTE — Assessment & Plan Note (Signed)
 Likely contributing to lower BP. Antihypertensive management as above. Continue to follow with Endo.

## 2023-09-26 ENCOUNTER — Ambulatory Visit: Payer: Self-pay | Admitting: Family Medicine

## 2023-09-26 DIAGNOSIS — G4733 Obstructive sleep apnea (adult) (pediatric): Secondary | ICD-10-CM | POA: Diagnosis not present

## 2023-09-26 LAB — COMPREHENSIVE METABOLIC PANEL WITH GFR
ALT: 24 IU/L (ref 0–44)
AST: 15 IU/L (ref 0–40)
Albumin: 4.2 g/dL (ref 4.1–5.1)
Alkaline Phosphatase: 100 IU/L (ref 44–121)
BUN/Creatinine Ratio: 10 (ref 9–20)
BUN: 12 mg/dL (ref 6–24)
Bilirubin Total: 0.3 mg/dL (ref 0.0–1.2)
CO2: 23 mmol/L (ref 20–29)
Calcium: 9.3 mg/dL (ref 8.7–10.2)
Chloride: 102 mmol/L (ref 96–106)
Creatinine, Ser: 1.17 mg/dL (ref 0.76–1.27)
Globulin, Total: 3 g/dL (ref 1.5–4.5)
Glucose: 95 mg/dL (ref 70–99)
Potassium: 4.3 mmol/L (ref 3.5–5.2)
Sodium: 138 mmol/L (ref 134–144)
Total Protein: 7.2 g/dL (ref 6.0–8.5)
eGFR: 76 mL/min/1.73 (ref >59)

## 2023-09-26 LAB — MICROALBUMIN / CREATININE URINE RATIO
Creatinine, Urine: 304.4 mg/dL
Microalb/Creat Ratio: 2 mg/g{creat} (ref 0–29)
Microalbumin, Urine: 6.9 ug/mL

## 2023-09-26 LAB — HEPATITIS C ANTIBODY: Hep C Virus Ab: NONREACTIVE

## 2023-09-27 ENCOUNTER — Ambulatory Visit (INDEPENDENT_AMBULATORY_CARE_PROVIDER_SITE_OTHER): Admitting: Family Medicine

## 2023-09-27 VITALS — BP 111/67 | Ht 68.0 in | Wt 286.0 lb

## 2023-09-27 DIAGNOSIS — M25562 Pain in left knee: Secondary | ICD-10-CM

## 2023-09-27 NOTE — Patient Instructions (Signed)
 Get your x-rays onto a disc or the report and drop this off when you get a chance. Do home quad exercises to help with kneecap tracking. Compression sleeve when up and walking around will help with the swelling and the feeling of popping, catching. Icing 15 minutes at a time as needed. Consider physical therapy, steroid injection. Unfortunately the arthrosamid looks like it's only available in [place] or the [place].  There are other options for viscosupplementation though (gel injections) if you want us  to look into those.

## 2023-09-29 DIAGNOSIS — D3501 Benign neoplasm of right adrenal gland: Secondary | ICD-10-CM | POA: Diagnosis not present

## 2023-09-29 DIAGNOSIS — E896 Postprocedural adrenocortical (-medullary) hypofunction: Secondary | ICD-10-CM | POA: Diagnosis not present

## 2023-09-29 NOTE — Progress Notes (Signed)
 PCP: Rosalynn Camie CROME, MD  Subjective:   HPI: Patient is a 48 y.o. male here for left knee pain.  8/8: Patient reports that he was at the gym on 08/22/2023 doing squatting exercises when he experienced a sharp pain in his knee when coming up. Since then, he has been experiencing a dull throbbing pain along the inside and back of his knee. He also feels tightness when fully flexing the knee. He has pain with walking that worsens over the course of the day, he also occasionally feels as though the knee is popping/catching. He has been treating at home with ice and Tylenol . He denies any history of injury to his left knee.   9/10: Patient reports some improvement. He's having issues more toward end of day with deep knee pain, catching, popping. No new injury. He did have x-rays at an outside facility but unable to see these. No giving out.  Past Medical History:  Diagnosis Date   Arthritis    knee bilateral   Asthma    as a child NO attacks   Diabetes mellitus without complication (HCC)    border line no medicationa   GERD (gastroesophageal reflux disease)    High cholesterol    Medial meniscus tear 01/2012   right knee   Obesity    Pneumonia    Renal disorder    hematuria,kidney stones   Sleep apnea    Vertigo, benign paroxysmal 2015    Current Outpatient Medications on File Prior to Visit  Medication Sig Dispense Refill   acetaminophen  (TYLENOL ) 325 MG tablet Take 325 mg by mouth every 8 (eight) hours as needed for moderate pain (pain score 4-6).     atorvastatin  (LIPITOR) 40 MG tablet Take 1 tablet (40 mg total) by mouth daily. 90 tablet 3   cetirizine  (ZYRTEC ) 10 MG tablet Take 1 tablet (10 mg total) by mouth daily. (Patient not taking: Reported on 06/09/2023) 10 tablet 0   doxazosin (CARDURA) 1 MG tablet Take 1 mg by mouth 2 (two) times daily. (Patient not taking: Reported on 06/09/2023)     gabapentin (NEURONTIN) 300 MG capsule Take 300 mg by mouth 3 (three) times daily.      hydrocortisone (CORTEF) 5 MG tablet Take 5 mg by mouth See admin instructions. TAKE 4 TABLETS (20 MG) AT 8 AM AND 2 TABLET (10 MG) AT 3 PM     losartan  (COZAAR ) 50 MG tablet Take 0.5 tablets (25 mg total) by mouth daily.     meclizine  (ANTIVERT ) 25 MG tablet Take 1 tablet (25 mg total) by mouth 3 (three) times daily as needed for dizziness (do not use for more than 3days). (Patient not taking: Reported on 06/09/2023) 21 tablet 0   meloxicam  (MOBIC ) 15 MG tablet Take 1 tablet by mouth daily. (Patient not taking: Reported on 05/23/2023)     metFORMIN  (GLUCOPHAGE ) 500 MG tablet Take 0.5 tablets (250 mg total) by mouth 2 (two) times daily with a meal. 90 tablet 3   Multiple Vitamins-Minerals (ONE-A-DAY MENS HEALTH FORMULA PO) Take 1 tablet by mouth daily.     omeprazole  (PRILOSEC) 40 MG capsule Take 1 capsule (40 mg total) by mouth 2 (two) times daily. 180 capsule 1   psyllium (METAMUCIL) 58.6 % packet Take 1 packet by mouth daily. (Patient not taking: Reported on 06/09/2023)     rizatriptan  (MAXALT ) 5 MG tablet Take 1-2 tablets (5-10 mg total) by mouth as needed for migraine. May repeat in 2 hours if needed. No  more than 30mg  in 24hrs (Patient not taking: Reported on 05/23/2023) 10 tablet 0   senna (SENOKOT) 8.6 MG tablet Take 2 tablets by mouth 2 (two) times daily. Wife will pick this up today 05/16/23 (Patient not taking: Reported on 05/23/2023)     tirzepatide  (MOUNJARO ) 10 MG/0.5ML Pen Inject 10 mg into the skin once a week. 2 mL 5   No current facility-administered medications on file prior to visit.    Past Surgical History:  Procedure Laterality Date   ADRENALECTOMY     CORNEAL TRANSPLANT Left 12/17/2009   KNEE ARTHROSCOPY  02/20/2012   Procedure: ARTHROSCOPY KNEE;  Surgeon: Norleen LITTIE Gavel, MD;  Location: [facility];  Service: Orthopedics;  Laterality: Right;  Chondroplasia patella femoral joint, medial meniscal debriedment    Allergies  Allergen Reactions   Shrimp [Shellfish  Allergy] Anaphylaxis    BP 111/67   Ht 5' 8 (1.727 m)   Wt 286 lb (129.7 kg)   BMI 43.49 kg/m       No data to display              No data to display              Objective:  Physical Exam:  Gen: NAD, comfortable in exam room  Left knee: Mild effusion.  No other gross deformity, ecchymoses. Mild TTP medial joint line, post patellar facets. FROM with normal strength. Negative ant/post drawers. Negative valgus/varus testing. Negative lachman. Negative mcmurrays, apleys, thessalys NV intact distally.   Assessment & Plan:  1. Left knee pain - currently pain more indicative of patellofemoral syndrome and likely arthritis.  Home exercises reviewed.  Compression sleeve.  Icing.  OTC medications for now. He's going to get us  a copy of the x-rays to review.  Consider physical therapy, steroid injection.  Asked us  about arthrosamid which is a hydrogel but do not see this is approved in the [place]  and advised other treatments before considering.

## 2023-10-04 DIAGNOSIS — M79604 Pain in right leg: Secondary | ICD-10-CM | POA: Diagnosis not present

## 2023-10-18 ENCOUNTER — Encounter: Payer: Self-pay | Admitting: *Deleted

## 2023-10-18 DIAGNOSIS — M545 Low back pain, unspecified: Secondary | ICD-10-CM | POA: Diagnosis not present

## 2023-10-19 ENCOUNTER — Telehealth: Admitting: Adult Health

## 2023-10-19 ENCOUNTER — Telehealth: Payer: BC Managed Care – PPO | Admitting: Adult Health

## 2023-10-24 ENCOUNTER — Ambulatory Visit: Admitting: Family Medicine

## 2023-10-24 ENCOUNTER — Encounter: Payer: Self-pay | Admitting: Family Medicine

## 2023-10-24 VITALS — BP 109/74 | Ht 68.0 in | Wt 286.0 lb

## 2023-10-24 DIAGNOSIS — M545 Low back pain, unspecified: Secondary | ICD-10-CM

## 2023-10-24 DIAGNOSIS — G4733 Obstructive sleep apnea (adult) (pediatric): Secondary | ICD-10-CM | POA: Diagnosis not present

## 2023-10-24 NOTE — Progress Notes (Signed)
 PCP: Rosalynn Camie CROME, MD  Subjective:   HPI: Patient is a 48 y.o. male here for low back pain.  Patient states that about a week ago he was doing leg press at the gym with quite a bit of weight, and while he had the weight on his legs, he went to adjust his lower back and had immediate sensation of pain.  He went to the urgent care and received oxycodone  and muscle relaxers.  He cannot take anti-inflammatories due to chronic adrenal insufficiency.  He is also on chronic steroids for this insufficiency.  He states that he has noticed some mild improvement but has included symptoms of periodic anterior and lateral thigh pain bilaterally, nothing is ever been radiating down to his lower legs or feet.  He states that sometimes will have periods of groin pain.  He has not taken any muscle relaxers due to his sleep schedule.  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  He denies any loss of bowel or bladder function, denies any groin numbness, and denies any motor weakness of his bilateral lower extremities.  Past Medical History:  Diagnosis Date   Arthritis    knee bilateral   Asthma    as a child NO attacks   Diabetes mellitus without complication (HCC)    border line no medicationa   GERD (gastroesophageal reflux disease)    High cholesterol    Medial meniscus tear 01/2012   right knee   Obesity    Pneumonia    Renal disorder    hematuria,kidney stones   Sleep apnea    Vertigo, benign paroxysmal 2015    Current Outpatient Medications on File Prior to Visit  Medication Sig Dispense Refill   acetaminophen  (TYLENOL ) 325 MG tablet Take 325 mg by mouth every 8 (eight) hours as needed for moderate pain (pain score 4-6).     atorvastatin  (LIPITOR) 40 MG tablet Take 1 tablet (40 mg total) by mouth daily. 90 tablet 3   cetirizine  (ZYRTEC ) 10 MG tablet Take 1 tablet (10 mg total) by mouth daily. (Patient not taking: Reported on 06/09/2023) 10 tablet 0   doxazosin  (CARDURA) 1 MG tablet Take 1 mg by mouth 2 (two) times daily. (Patient not taking: Reported on 06/09/2023)     gabapentin (NEURONTIN) 300 MG capsule Take 300 mg by mouth 3 (three) times daily.     hydrocortisone (CORTEF) 5 MG tablet Take 5 mg by mouth See admin instructions. TAKE 4 TABLETS (20 MG) AT 8 AM AND 2 TABLET (10 MG) AT 3 PM     losartan  (COZAAR ) 50 MG tablet Take 0.5 tablets (25 mg total) by mouth daily.     meclizine  (ANTIVERT ) 25 MG tablet Take 1 tablet (25 mg total) by mouth 3 (three) times daily as needed for dizziness (do not use for more than 3days). (Patient not taking: Reported on 06/09/2023) 21 tablet 0   meloxicam  (MOBIC ) 15 MG tablet Take 1 tablet by mouth daily. (Patient not taking: Reported on 05/23/2023)     metFORMIN  (GLUCOPHAGE ) 500 MG tablet Take 0.5 tablets (250 mg total) by mouth 2 (two) times daily with a meal. 90 tablet 3   Multiple Vitamins-Minerals (ONE-A-DAY MENS HEALTH FORMULA PO) Take 1 tablet by mouth daily.     omeprazole  (PRILOSEC) 40 MG capsule Take 1 capsule (40 mg total) by mouth 2 (two) times daily. 180 capsule 1   psyllium (METAMUCIL) 58.6 % packet Take 1 packet  by mouth daily. (Patient not taking: Reported on 06/09/2023)     rizatriptan  (MAXALT ) 5 MG tablet Take 1-2 tablets (5-10 mg total) by mouth as needed for migraine. May repeat in 2 hours if needed. No more than 30mg  in 24hrs (Patient not taking: Reported on 05/23/2023) 10 tablet 0   senna (SENOKOT) 8.6 MG tablet Take 2 tablets by mouth 2 (two) times daily. Wife will pick this up today 05/16/23 (Patient not taking: Reported on 05/23/2023)     tirzepatide  (MOUNJARO ) 10 MG/0.5ML Pen Inject 10 mg into the skin once a week. 2 mL 5   No current facility-administered medications on file prior to visit.    BP 109/74   Ht 5' 8 (1.727 m)   Wt 286 lb (129.7 kg)   BMI 43.49 kg/m        Objective:   Physical Exam:  Gen: NAD, comfortable in exam room Lumbar spine Palpation: There is palpation over the  spinous process of the lumbar spine, no paraspinal tenderness Special Tests: Straight leg test is negative bilaterally Neuro: Strength is 5/5 with hip flexion, knee extension, knee flexion, plantarflexion, dorsiflexion, sensation is reported equal and intact to the bilateral lower extremities, DTRs are 2+ bilaterally at the patellar and Achilles tendons  X-ray of the lumbar spine were reviewed today  Assessment/Plan:   Derrick Scott is a 48 y.o. male who was seen today for the following: 1. Acute midline low back pain without sciatica (Primary) - Patient's exam is reassuring that there is unlikely to be any nerve compression - His x-rays were reassuring - We will have him start formal physical therapy for core strengthening - Recommended topical lidocaine  patch for improved pain control - We will follow-up with him in 6 weeks - If his pain continues up to that time, we may pursue MRI.  Follow-up/Education:   Return in about 6 weeks (around 12/05/2023).   May return sooner as needed and encouraged to call/e-mail for additional questions or  worsening symptoms in the interim.  Krystal Lowing, DO Sports Medicine Fellow 10/24/2023 12:55 PM

## 2023-11-03 DIAGNOSIS — E2740 Unspecified adrenocortical insufficiency: Secondary | ICD-10-CM | POA: Diagnosis not present

## 2023-11-03 DIAGNOSIS — E119 Type 2 diabetes mellitus without complications: Secondary | ICD-10-CM | POA: Diagnosis not present

## 2023-11-03 DIAGNOSIS — Z9889 Other specified postprocedural states: Secondary | ICD-10-CM | POA: Diagnosis not present

## 2023-11-03 DIAGNOSIS — I10 Essential (primary) hypertension: Secondary | ICD-10-CM | POA: Diagnosis not present

## 2023-11-03 DIAGNOSIS — G4733 Obstructive sleep apnea (adult) (pediatric): Secondary | ICD-10-CM | POA: Diagnosis not present

## 2023-11-03 DIAGNOSIS — Z9089 Acquired absence of other organs: Secondary | ICD-10-CM | POA: Diagnosis not present

## 2023-11-03 DIAGNOSIS — E785 Hyperlipidemia, unspecified: Secondary | ICD-10-CM | POA: Diagnosis not present

## 2023-11-03 DIAGNOSIS — Z9989 Dependence on other enabling machines and devices: Secondary | ICD-10-CM | POA: Diagnosis not present

## 2023-11-03 DIAGNOSIS — Z7952 Long term (current) use of systemic steroids: Secondary | ICD-10-CM | POA: Diagnosis not present

## 2023-11-09 ENCOUNTER — Ambulatory Visit

## 2023-11-09 VITALS — BP 120/82 | HR 107 | Ht 68.0 in | Wt 290.0 lb

## 2023-11-09 DIAGNOSIS — R0789 Other chest pain: Secondary | ICD-10-CM | POA: Diagnosis not present

## 2023-11-09 DIAGNOSIS — R110 Nausea: Secondary | ICD-10-CM | POA: Diagnosis not present

## 2023-11-09 MED ORDER — ONDANSETRON 4 MG PO TBDP: 4.0000 mg | ORAL_TABLET | Freq: Three times a day (TID) | ORAL | 0 refills | Status: AC | PRN

## 2023-11-09 MED ORDER — DICLOFENAC SODIUM 1 % EX GEL
2.0000 g | Freq: Four times a day (QID) | CUTANEOUS | 1 refills | Status: AC | PRN
Start: 2023-11-09 — End: ?

## 2023-11-09 NOTE — Patient Instructions (Signed)
  VISIT SUMMARY: During your visit, we discussed your right flank pain, fatigue, gastrointestinal symptoms. We also addressed your work-related fatigue and provided a plan to help manage your symptoms and improve your overall health.  YOUR PLAN: RIGHT LOWER RIB PAIN (LIKELY COSTOCHONDRITIS): Your right flank pain is likely due to costochondritis, which is inflammation of the rib cartilage. This pain is worsened by movement and deep breathing. -Use Voltaren  gel over the right lower rib as instructed. -Consider using lidocaine  cream for additional pain relief. -Perform stretching exercises to relieve strain on the lower ribs. -Seek evaluation if you experience trouble breathing or develop a fever.  FATIGUE AND NAUSEA (POSSIBLY RELATED TO HYDROCORTISONE DOSE REDUCTION): Your fatigue and nausea may be related to the recent reduction in your hydrocortisone dosage. -A prescription for Zofran  has been sent to [facility] [address] to help manage your nausea. -Follow up with your endocrinologist as scheduled on November 14. -Contact your endocrinologist for an earlier evaluation if your symptoms persist or worsen.  WORK-RELATED FATIGUE AND NEED FOR REST: Your fatigue is worsened by work demands and lack of rest. -A work note has been provided for time off until October 27 to allow you to rest and recover.                      Contains text generated by [employer].                                 Contains text generated by [employer].

## 2023-11-09 NOTE — Progress Notes (Signed)
    SUBJECTIVE:   CHIEF COMPLAINT / HPI:   Discussed the use of AI scribe software for clinical note transcription with the patient, who gave verbal consent to proceed.  History of Present Illness Derrick Scott is a 48 year old male with a history of adrenal gland surgery who presents with right flank pain and fatigue.  Right flank pain - Constant pain localized along the right ribs for one and a half weeks - Pain worsens with movement and certain positions, including driving - No radiation to the groin; remains localized to the right flank - No associated fever, urinary symptoms, or hematuria - Tylenol  used for pain relief due to kidney condition precluding ibuprofen  use - History of right-sided adrenal gland surgery a few months ago with incision on the right side  Fatigue and adrenal insufficiency symptoms - Fatigue present for approximately three weeks - Fatigue coincides with reduction in hydrocortisone dosage - Hydrocortisone taken in the morning and afternoon - Fatigue worsens when medication levels drop  Gastrointestinal symptoms - Nausea occurs both with and without food, affecting appetite - No vomiting - No recent viral illnesses - No respiratory symptoms     PERTINENT  PMH / PSH: HTN, migraines, HLD, DM, adrenal insufficiency after adrenalectomy for suspected pheochromocytoma.  He is on hydrocortisone.  OBJECTIVE:   BP 120/82   Pulse (!) 103   Ht 5' 8 (1.727 m)   Wt 290 lb (131.5 kg)   SpO2 97%   BMI 44.09 kg/m    General: NAD, pleasant, able to participate in exam Cardiac: slightly tachycardic, normal rhythm, no murmurs auscultated Respiratory: CTAB, normal WOB Abdomen: soft, non-tender, non-distended, normoactive bowel sounds Extremities: warm and well perfused, no edema or cyanosis Skin: warm and dry, no rashes noted Neuro: alert, no obvious focal deficits, speech normal Psych: Normal affect and mood  ASSESSMENT/PLAN:    Assessment &  Plan Rib pain on right side Suspect costochondritis given reproducibility to palpation of his lower ribs with worsening with movement.  Exam is otherwise benign, no evidence of rash or infection.  No CVA tenderness or urinary symptoms to suggest involvement of his kidneys.  He is slightly tachycardic which may be related to his steroids but I have a low suspicion for PE given lack of pleuritic pain and otherwise low risk. - Recommend OTC Voltaren  gel over the right lower rib - Could consider lidocaine  cream as well - Continue Tylenol  as needed.  Unable to use NSAIDs due to being on hydrocortisone - Discussed some stretching exercises to relieve strain on the lower ribs - Discussed return precautions Nausea Related to recent decreases in his dose of hydrocortisone - Recommend touching base with his endocrinologist to see if he can be evaluated sooner - Sent Rx for Zofran  as needed   Payton Coward, MD [facility]

## 2023-11-10 ENCOUNTER — Telehealth: Payer: Self-pay

## 2023-11-10 NOTE — Telephone Encounter (Signed)
 Prior authorization submitted for DICLOFENAC  1% GEL to [facility] via [employer].   Key: [id number]

## 2023-11-13 NOTE — Telephone Encounter (Signed)
 Pharmacy Patient Advocate Encounter  Received notification from [facility] that Prior Authorization for DICLOFENAC  1% GEL has been DENIED.  Full denial letter will be uploaded to the media tab. See denial reason below.  Medication denied because it is not used for the covered condition; approval is limited to osteoarthritis pain in specific joints.   Your plan only covers this drug when it is used for certain health conditions. Covered use is for osteoarthritis pain in joints that can be treated topically such as feet, ankles, knees, hands, wrists, or elbows. Your plan does not cover this drug for your health condition that your doctor told us  you have.  VOLTAREN  GEL AVAILABLE OTC  PA #/Case ID/Reference #: [id number]

## 2023-12-01 DIAGNOSIS — E896 Postprocedural adrenocortical (-medullary) hypofunction: Secondary | ICD-10-CM | POA: Diagnosis not present

## 2023-12-01 DIAGNOSIS — E2740 Unspecified adrenocortical insufficiency: Secondary | ICD-10-CM | POA: Diagnosis not present

## 2023-12-01 DIAGNOSIS — I10 Essential (primary) hypertension: Secondary | ICD-10-CM | POA: Diagnosis not present

## 2023-12-01 DIAGNOSIS — R7989 Other specified abnormal findings of blood chemistry: Secondary | ICD-10-CM | POA: Diagnosis not present

## 2023-12-24 DIAGNOSIS — G4733 Obstructive sleep apnea (adult) (pediatric): Secondary | ICD-10-CM | POA: Diagnosis not present

## 2023-12-31 IMAGING — MR MR HEAD W/O CM
13 series · 48 of 48 positions shown · non-contrast
Comparison: Neck CT 01/06/2021.  Brain MRI 11/02/2016.

CLINICAL DATA: Provided history: Benign paroxysmal positional
vertigo, unspecified laterality. Facial tingling. Dizziness.
Additional history provided by scanning technologist: Patient
reports vertigo since January 2021.

EXAM:
MRI HEAD WITHOUT CONTRAST
TECHNIQUE: Multiplanar, multiecho pulse sequences of the brain and surrounding
structures were obtained without intravenous contrast.

[Series 5: T1 · sagittal · 4.0mm · 0.75mm/px · 1 of 31 slices shown (1 of 2)]
[im 1/31]
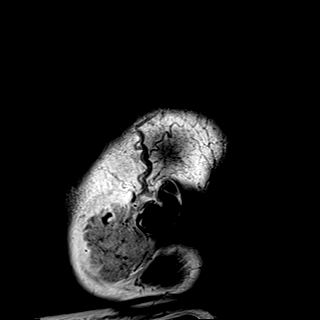

[Series 6: DWI · axial · 3.0mm · 0.94mm/px · z∈[-64,+77]mm · 9 of 160 slices shown (1 of 3)]
[im 1/160]
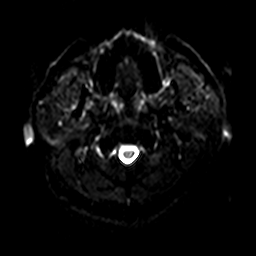
[im 20/160]
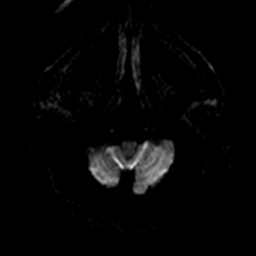
[im 40/160]
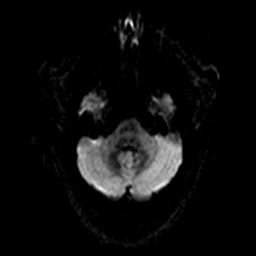
[im 60/160]
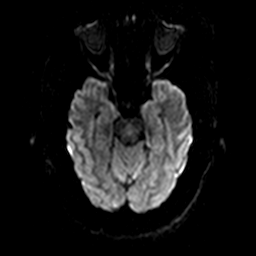
[im 80/160]
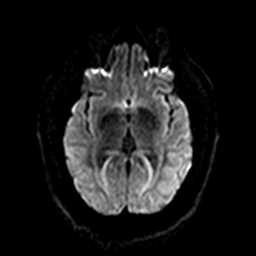
[im 100/160]
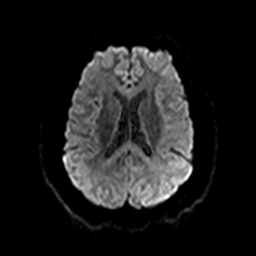
[im 120/160]
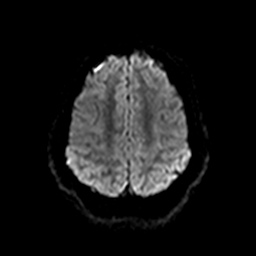
[im 140/160]
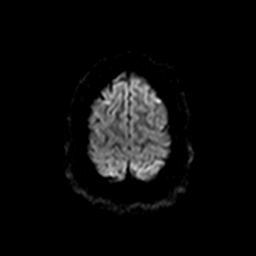
[im 160/160]
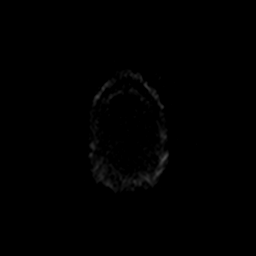

[Series 7: ax dwi_tracew · axial · 3.0mm · 0.94mm/px · z∈[-64,+77]mm · 4 of 80 slices shown]
[im 1/80]
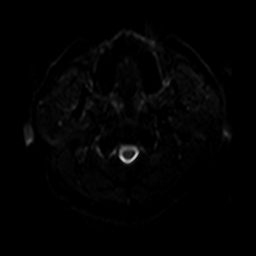
[im 27/80]
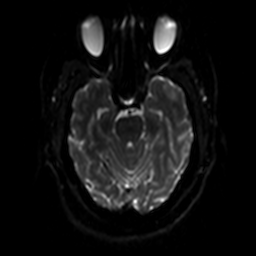
[im 53/80]
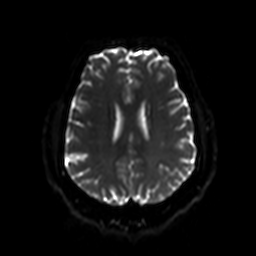
[im 80/80]
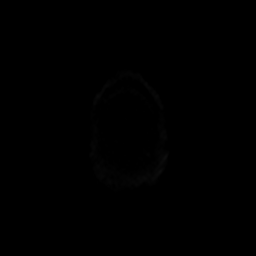

[Series 8: ax dwi_adc · axial · 3.0mm · 0.94mm/px · z∈[-64,+77]mm · 2 of 40 slices shown]
[im 1/40]
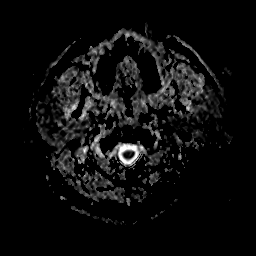
[im 40/40]
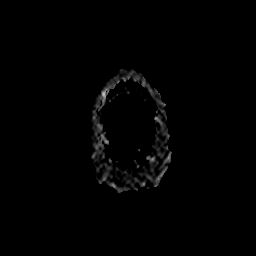

[Series 9: DWI · coronal · 5.0mm · 1.44mm/px · 4 of 64 slices shown (2 of 3)]
[im 1/64]
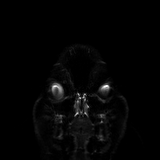
[im 22/64]
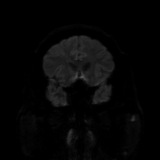
[im 43/64]
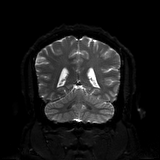
[im 64/64]
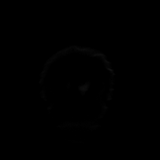

[Series 10: DWI · coronal · 5.0mm · 1.44mm/px · 2 of 32 slices shown (3 of 3)]
[im 1/32]
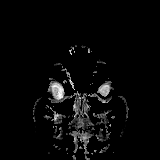
[im 32/32]
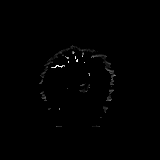

[Series 11: T2 · axial · 4.0mm · 0.36mm/px · z∈[-61,+79]mm · 2 of 28 slices shown (1 of 3)]
[im 1/28]
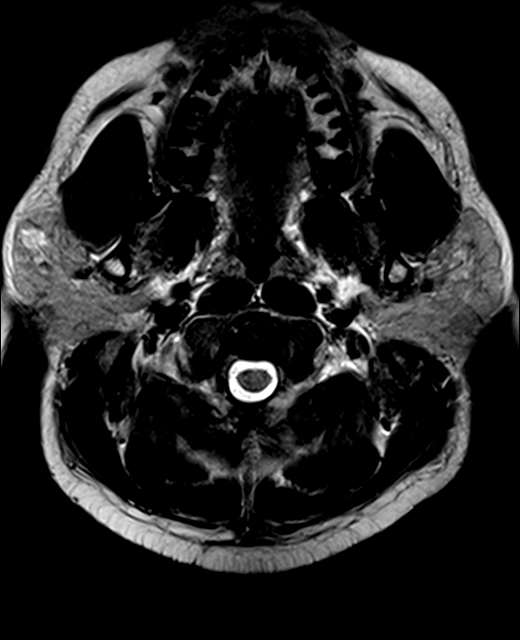
[im 28/28]
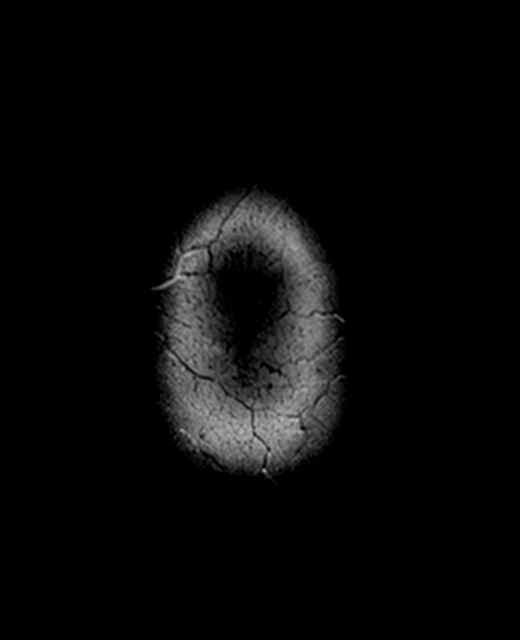

[Series 12: FLAIR · axial · 3.0mm · 0.72mm/px · z∈[-65,+85]mm · 2 of 26 slices shown (1 of 2)]
[im 1/26]
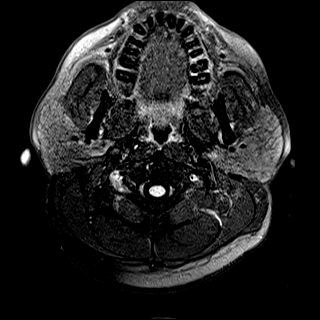
[im 26/26]
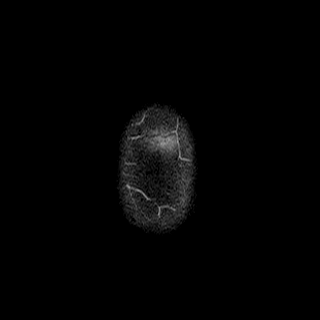

[Series 13: swi_images · axial · 1.5mm · 0.90mm/px · z∈[-62,+80]mm · 6 of 96 slices shown]
[im 1/96]
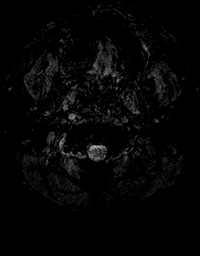
[im 20/96]
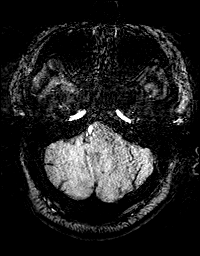
[im 39/96]
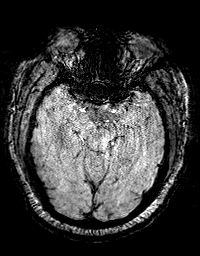
[im 58/96]
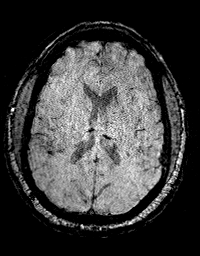
[im 77/96]
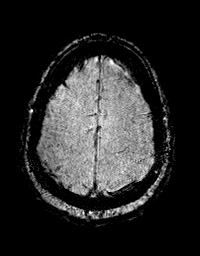
[im 96/96]
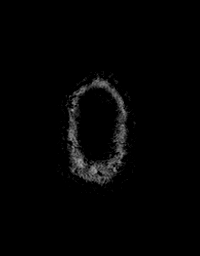

[Series 15: T1 · axial · 1.0mm · 0.94mm/px · z∈[-81,+78]mm · 10 of 160 slices shown (2 of 2)]
[im 1/160]
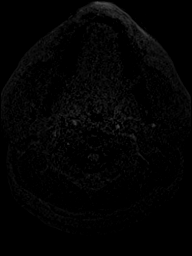
[im 18/160]
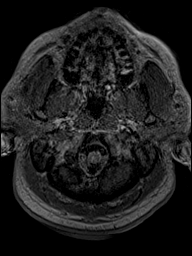
[im 36/160]
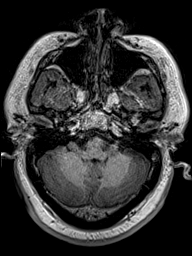
[im 54/160]
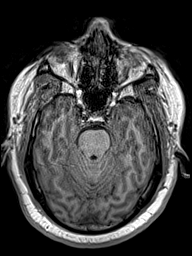
[im 71/160]
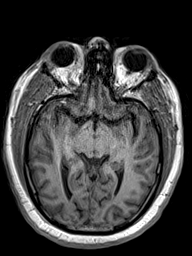
[im 89/160]
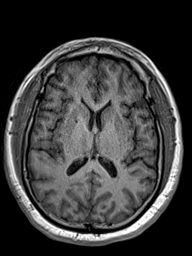
[im 107/160]
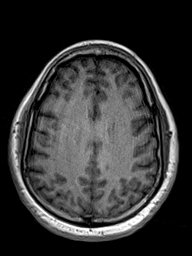
[im 124/160]
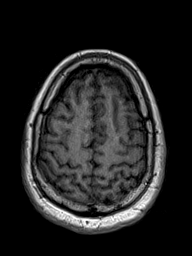
[im 142/160]
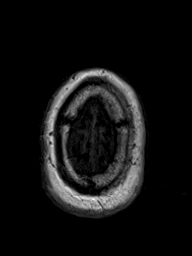
[im 160/160]
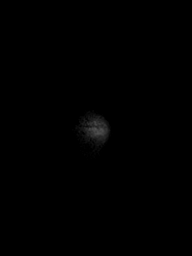

[Series 16: T2 · coronal · 4.5mm · 0.36mm/px · 2 of 30 slices shown (2 of 3)]
[im 1/30]
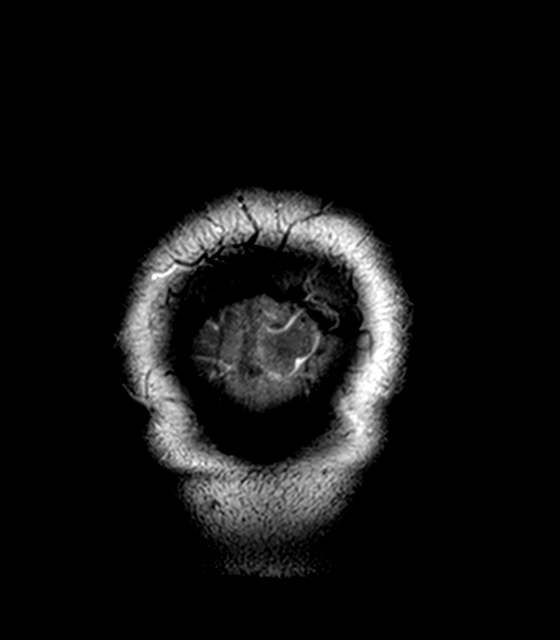
[im 30/30]
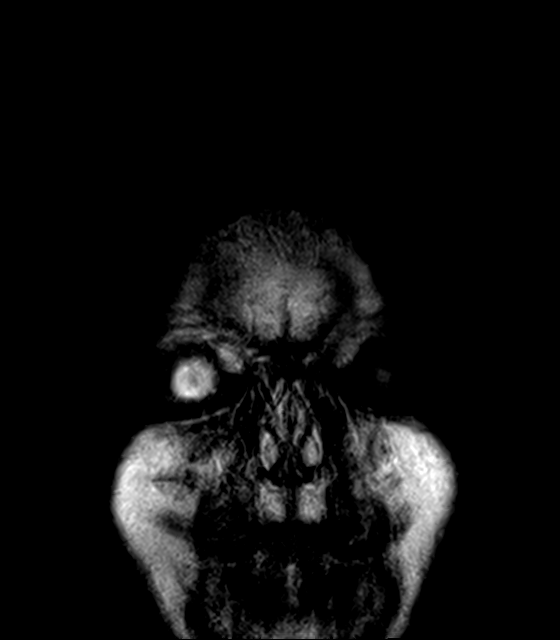

[Series 17: T2 · coronal · 4.5mm · 0.36mm/px · 2 of 30 slices shown (3 of 3)]
[im 1/30]
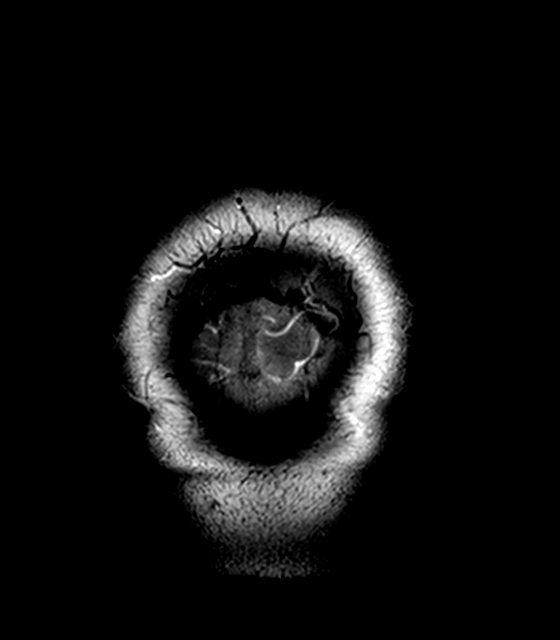
[im 30/30]
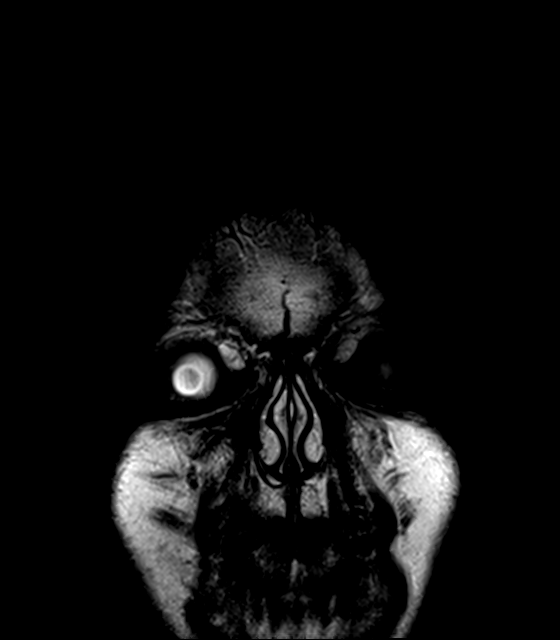

[Series 18: FLAIR · sagittal · 4.0mm · 0.72mm/px · 2 of 27 slices shown (2 of 2)]
[im 1/27]
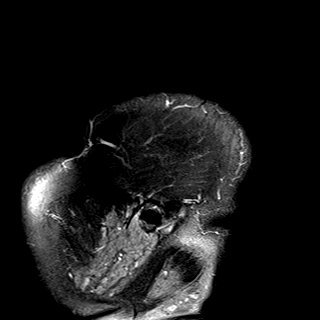
[im 27/27]
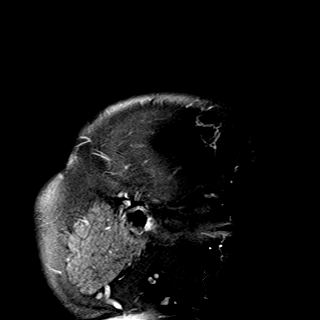

[48 of 48 positions shown; findings below may reference images not displayed]

FINDINGS: Brain:

Mild intermittent motion degradation.

Cerebral volume appears normal for age.

Mild multifocal T2 FLAIR hyperintense signal abnormality within the
cerebral white matter, slightly progressed from the prior brain MRI
of 11/02/2016.

There is no acute infarct.

No evidence of an intracranial mass.

No chronic intracranial blood products.

No extra-axial fluid collection.

No midline shift.

Vascular: Maintained flow voids within the proximal large arterial
vessels.

Skull and upper cervical spine: No focal suspicious marrow lesion.

Sinuses/Orbits: Trace scattered mucosal thickening within the
bilateral ethmoid sinuses.
IMPRESSION: Mildly motion degraded exam.

No evidence of acute intracranial abnormality on this non-contrast
routine protocol brain MRI.

Mild multifocal T2 FLAIR hyperintense signal abnormality within the
cerebral white matter, slightly progressed from the prior brain MRI
of 11/02/2016. The signal changes are nonspecific, but likely
reflect chronic small vessel ischemic disease given the patient's
history of diabetes and high cholesterol.

## 2024-01-22 ENCOUNTER — Other Ambulatory Visit: Payer: Self-pay | Admitting: Family Medicine

## 2024-01-22 MED ORDER — OMEPRAZOLE 40 MG PO CPDR: 40.0000 mg | DELAYED_RELEASE_CAPSULE | Freq: Two times a day (BID) | ORAL | 1 refills | Status: AC
